# Patient Record
Sex: Male | Born: 1935
Health system: Southern US, Community
[De-identification: ages and names within clinical notes are randomized; demographics above are authoritative.]

## PROBLEM LIST (undated history)

## (undated) DIAGNOSIS — I429 Cardiomyopathy, unspecified: Secondary | ICD-10-CM

## (undated) DIAGNOSIS — R079 Chest pain, unspecified: Secondary | ICD-10-CM

## (undated) DIAGNOSIS — Z9889 Other specified postprocedural states: Secondary | ICD-10-CM

## (undated) DIAGNOSIS — I4510 Unspecified right bundle-branch block: Secondary | ICD-10-CM

## (undated) DIAGNOSIS — I10 Essential (primary) hypertension: Secondary | ICD-10-CM

## (undated) DIAGNOSIS — E6609 Other obesity due to excess calories: Secondary | ICD-10-CM

## (undated) DIAGNOSIS — M169 Osteoarthritis of hip, unspecified: Secondary | ICD-10-CM

## (undated) DIAGNOSIS — E1169 Type 2 diabetes mellitus with other specified complication: Secondary | ICD-10-CM

## (undated) DIAGNOSIS — E1129 Type 2 diabetes mellitus with other diabetic kidney complication: Secondary | ICD-10-CM

## (undated) DIAGNOSIS — H269 Unspecified cataract: Secondary | ICD-10-CM

## (undated) DIAGNOSIS — N183 Chronic kidney disease, stage 3 unspecified: Secondary | ICD-10-CM

## (undated) DIAGNOSIS — E785 Hyperlipidemia, unspecified: Secondary | ICD-10-CM

## (undated) DIAGNOSIS — I872 Venous insufficiency (chronic) (peripheral): Secondary | ICD-10-CM

## (undated) HISTORY — DX: Other obesity due to excess calories: E66.09

## (undated) HISTORY — DX: Venous insufficiency (chronic) (peripheral): I87.2

## (undated) HISTORY — DX: Hyperlipidemia, unspecified: E78.5

## (undated) HISTORY — DX: Essential (primary) hypertension: I10

## (undated) HISTORY — DX: Osteoarthritis of hip, unspecified: M16.9

## (undated) HISTORY — DX: Hyperlipidemia, unspecified: E11.69

## (undated) HISTORY — DX: Other specified postprocedural states: Z98.890

## (undated) HISTORY — DX: Type 2 diabetes mellitus with other diabetic kidney complication: E11.29

## (undated) HISTORY — DX: Chronic kidney disease, stage 3 (moderate): N18.3

## (undated) HISTORY — DX: Unspecified cataract: H26.9

## (undated) HISTORY — DX: Chronic kidney disease, stage 3 unspecified: N18.30

## (undated) HISTORY — DX: Unspecified right bundle-branch block: I45.10

---

## 2007-02-28 ENCOUNTER — Ambulatory Visit: Payer: Self-pay | Admitting: Gastroenterology

## 2007-03-14 ENCOUNTER — Ambulatory Visit: Payer: Self-pay | Admitting: Gastroenterology

## 2007-03-28 ENCOUNTER — Ambulatory Visit: Payer: Self-pay

## 2009-03-25 ENCOUNTER — Encounter: Payer: Self-pay | Admitting: Gastroenterology

## 2009-07-25 ENCOUNTER — Telehealth: Payer: Self-pay | Admitting: Gastroenterology

## 2009-09-09 ENCOUNTER — Encounter (INDEPENDENT_AMBULATORY_CARE_PROVIDER_SITE_OTHER): Payer: Self-pay | Admitting: *Deleted

## 2009-09-09 ENCOUNTER — Ambulatory Visit: Payer: Self-pay | Admitting: Gastroenterology

## 2009-09-09 DIAGNOSIS — R195 Other fecal abnormalities: Secondary | ICD-10-CM

## 2009-09-12 ENCOUNTER — Encounter: Payer: Self-pay | Admitting: Gastroenterology

## 2009-10-14 ENCOUNTER — Ambulatory Visit: Payer: Self-pay | Admitting: Gastroenterology

## 2009-10-14 DIAGNOSIS — K2970 Gastritis, unspecified, without bleeding: Secondary | ICD-10-CM | POA: Insufficient documentation

## 2009-10-14 DIAGNOSIS — K2990 Gastroduodenitis, unspecified, without bleeding: Secondary | ICD-10-CM

## 2009-10-14 LAB — CONVERTED CEMR LAB: UREASE: NEGATIVE

## 2009-10-16 ENCOUNTER — Encounter: Payer: Self-pay | Admitting: Gastroenterology

## 2009-10-22 ENCOUNTER — Encounter: Payer: Self-pay | Admitting: Gastroenterology

## 2010-03-13 ENCOUNTER — Encounter: Payer: Self-pay | Admitting: Cardiovascular Disease

## 2010-03-13 ENCOUNTER — Emergency Department (HOSPITAL_COMMUNITY)
Admission: EM | Admit: 2010-03-13 | Discharge: 2010-03-13 | Payer: Self-pay | Source: Home / Self Care | Admitting: Emergency Medicine

## 2010-03-31 ENCOUNTER — Encounter: Payer: Self-pay | Admitting: Cardiovascular Disease

## 2010-04-23 HISTORY — PX: NM MYOVIEW LTD: HXRAD82

## 2010-05-05 ENCOUNTER — Ambulatory Visit: Payer: Self-pay | Admitting: Cardiovascular Disease

## 2010-05-05 DIAGNOSIS — R072 Precordial pain: Secondary | ICD-10-CM | POA: Insufficient documentation

## 2010-05-05 DIAGNOSIS — I4510 Unspecified right bundle-branch block: Secondary | ICD-10-CM

## 2010-05-08 ENCOUNTER — Telehealth (INDEPENDENT_AMBULATORY_CARE_PROVIDER_SITE_OTHER): Payer: Self-pay | Admitting: *Deleted

## 2010-05-12 ENCOUNTER — Encounter: Payer: Self-pay | Admitting: Cardiology

## 2010-05-12 ENCOUNTER — Ambulatory Visit: Payer: Self-pay

## 2010-05-12 ENCOUNTER — Ambulatory Visit: Payer: Self-pay | Admitting: Cardiology

## 2010-05-12 ENCOUNTER — Encounter (HOSPITAL_COMMUNITY): Admission: RE | Admit: 2010-05-12 | Discharge: 2010-07-29 | Payer: Self-pay | Admitting: Cardiovascular Disease

## 2010-09-22 ENCOUNTER — Telehealth (INDEPENDENT_AMBULATORY_CARE_PROVIDER_SITE_OTHER): Payer: Self-pay | Admitting: *Deleted

## 2010-12-23 NOTE — Assessment & Plan Note (Signed)
Summary: Cardiology Nuclear Study  Nuclear Med Background Indications for Stress Test: Evaluation for Ischemia  Indications Comments: 03/13/10 [facility]:Chest pain, abnormal EKG (RBBB)  History: Myocardial Perfusion Study  History Comments: '08 [id number]:[id number], EF=62%  Symptoms: Chest Pain, Diaphoresis, DOE, Nausea, Vomiting  Symptoms Comments: Last episode of HY:QMVH since d/c.   Nuclear Pre-Procedure Cardiac Risk Factors: Family History - CAD, History of Smoking, NIDDM, Obesity, RBBB Caffeine/Decaff Intake: None NPO After: 9:00 PM Lungs: Clear.  O2 Sat 96% on RA. IV 0.9% NS with Angio Cath: 20g     IV Site: (R) AC IV Started by: Stanton Kidney EMT-P Chest Size (in) 46     Height (in): 72 Weight (lb): 236 BMI: 32.12 Tech Comments: Metoprolol held this am, per patient. CBG= 97 @ 6:40 am today, per patient.  Nuclear Med Study 1 or 2 day study:  1 day     Stress Test Type:  Eugenie Birks Reading MD:  Willa Rough, MD     Referring MD:  Charlton Haws, MD Resting Radionuclide:  Technetium 52m Tetrofosmin     Resting Radionuclide Dose:  11 mCi  Stress Radionuclide:  Technetium 61m Tetrofosmin     Stress Radionuclide Dose:  33 mCi   Stress Protocol   Lexiscan: 0.4 mg   Stress Test Technologist:  Rea [employer] CMA-N     Nuclear Technologist:  Domenic Polite CNMT  Rest Procedure  Myocardial perfusion imaging was performed at rest 45 minutes following the intravenous administration of Myoview Technetium 72m Tetrofosmin.  Stress Procedure  The patient received IV Lexiscan 0.4 mg over 15-seconds.  Myoview injected at 30-seconds.  There were no significant changes with infusion, isolated PVC.  He did c/o throat tightness.  Quantitative spect images were obtained after a 45 minute delay.  QPS Raw Data Images:  Patient motion noted; appropriate software correction applied. Stress Images:  Mild apical thinning. Rest Images:  Same as stress Subtraction (SDS):  No evidence of ischemia. Transient  Ischemic Dilatation:  1.08  (Normal <1.22)  Lung/Heart Ratio:  .41  (Normal <0.45)  Quantitative Gated Spect Images QGS EDV:  104 ml QGS ESV:  20 ml QGS EF:  62 % QGS cine images:  Normal motion  Findings Normal nuclear study      Overall Impression  Exercise Capacity: Lexiscan BP Response: Normal blood pressure response. Clinical Symptoms: Throat tight ECG Impression: No significant ST segment change suggestive of ischemia. Overall Impression: Normal stress nuclear study.  Appended Document: Cardiology Nuclear Study normal nuclear study  Appended Document: Cardiology Nuclear Study pt aware of results

## 2010-12-23 NOTE — Progress Notes (Signed)
Summary: Nuclear Pre-Procedure  Phone Note Outgoing Call Call back at [facility] Phone [phone]   Call placed by: Stanton Kidney, EMT-P,  May 08, 2010 10:55 AM Action Taken: Phone Call Completed Summary of Call: Left message with information on Myoview Information Sheet (see scanned document for details).     Nuclear Med Background Indications for Stress Test: Evaluation for Ischemia  Indications Comments: 03/13/10 [facility]: ABN. EKG (RBBB)  History: Myocardial Perfusion Study  History Comments: '08 [facility]PS: EF=62%, NL  Symptoms: Chest Pain, DOE    Nuclear Pre-Procedure Cardiac Risk Factors: Family History - CAD, History of Smoking, NIDDM, RBBB Height (in): 72

## 2010-12-23 NOTE — Miscellaneous (Signed)
  Clinical Lists Changes  Medications: Changed medication from METOPROLOL TARTRATE 100 MG TABS (METOPROLOL TARTRATE) 1 tablet by mouth once daily to METOPROLOL SUCCINATE 100 MG XR24H-TAB (METOPROLOL SUCCINATE) Take one tablet by mouth daily

## 2010-12-23 NOTE — Consult Note (Signed)
Summary: [facility]   Imported By: Marylou Mccoy 04/30/2010 11:58:40  _____________________________________________________________________  External Attachment:    Type:   Image     Comment:   External Document

## 2010-12-23 NOTE — Progress Notes (Signed)
  Phone Note From Other Clinic   Caller: Allison/[facility] Initial call taken by: Hillery Jacks test over to [phone] [facility]  September 22, 2010 11:13 AM

## 2010-12-23 NOTE — Assessment & Plan Note (Signed)
Summary: np3/ hyperlipidemia/ pt has medicare,. bcbs. gd   Referring Provider:  [name], MD Primary Provider:  [name], MD  CC:  check up pt was recently in ER.  History of Present Illness: Steven Myers is a pleasant patient of Dr Perrin Maltese.  He is referred for SSCP, HTN and RBBB.  He is a type 2 diabetic.  ON 03/13/10 he was seen in the ER for what appears to be an episode of indigestion.  He R/O ECG showed RBBB no change from ECG 11/10.  He has not had a recurrence.  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  His inital episode on April was associated with diaphoresis, and epigastric pain.  I reviewed his ER report and labs were ok, CXR NAD and enzymes negative.  He reports haing a stress test a few years ago that was ok.  He monitors his BS at home regularly and indicates FBS around 100-105.  His cholesterol is normal and he does not smoke  Current Problems (verified): 1)  Chest Pain Unspecified  (ICD-786.50) 2)  Gastritis  (ICD-535.50) 3)  Fecal Occult Blood  (ICD-792.1) 4)  Diabetes Mellitus-type II  (ICD-250.00)  Current Medications (verified): 1)  Metformin Hcl 1000 Mg Tabs (Metformin Hcl) .Marland Kitchen.. 1 Tablet By Mouth Two Times A Day 2)  Xalatan 0.005 % Soln (Latanoprost) .Marland Kitchen.. 1 Drop Each Eye At Bedtime 3)  Multivitamins  Tabs (Multiple Vitamin) .Marland Kitchen.. 1 Tablet By Mouth Once Daily 4)  Fish Oil 1000 Mg Caps (Omega-3 Fatty Acids) .Marland Kitchen.. 1 Capsule By Mouth Two Times A Day 5)  Trandolapril 4 Mg Tabs (Trandolapril) .Marland Kitchen.. 1 Tablet By Mouth Once Daily 6)  Hydrochlorothiazide 12.5 Mg Caps (Hydrochlorothiazide) .Marland Kitchen.. 1 Capsule By Mouth Two Times A Day 7)  Metoprolol Tartrate 100 Mg Tabs (Metoprolol Tartrate) .Marland Kitchen.. 1 Tablet By Mouth Once Daily 8)  Aspirin 81 Mg Tbec (Aspirin) .Marland Kitchen.. 1 Tablet By Mouth Once Daily  Allergies (verified): No Known Drug Allergies  Past History:  Past Medical History: Last updated:  04/30/2010 Diabetes Glaucoma Hypertension Abnormal ECG: RBBB  Past Surgical History: Last updated: 09/09/2009 None  Family History: Last updated: 09/09/2009 No FH of Colon Cancer: Family History of Diabetes: mother  Social History: Last updated: 09/09/2009 Occupation: [employer] Married 1 boy 1 girl Patient is a former smoker. quit 40 years ago Alcohol Use - no Illicit Drug Use - no  Review of Systems       Denies fever, malais, weight loss, blurry vision, decreased visual acuity, cough, sputum, SOB, hemoptysis, pleuritic pain, palpitaitons, heartburn, abdominal pain, melena, lower extremity edema, claudication, or rash.   Vital Signs:  Patient profile:   76 year old male Height:      72 inches Weight:      204 pounds BMI:     27.77 Pulse rate:   73 / minute Resp:     12 per minute BP sitting:   150 / 90  (left arm)  Vitals Entered By: Kem Parkinson (May 05, 2010 8:07 AM)  Physical Exam  General:  Affect appropriate Healthy:  appears stated age HEENT: normal Neck supple with no adenopathy JVP normal no bruits no thyromegaly Lungs clear with no wheezing and good diaphragmatic motion Heart:  S1/S2 no murmur,rub, gallop or click PMI normal Abdomen: benighn, BS positve, no tenderness, no AAA no bruit.  No HSM or HJR Distal pulses intact with no bruits No edema  Neuro non-focal Skin warm and dry    Impression & Recommendations:  Problem # 1:  CHEST PAIN UNSPECIFIED (ICD-786.50) F/U myouve in light of DM His updated medication list for this problem includes:    Trandolapril 4 Mg Tabs (Trandolapril) .Marland Kitchen... 1 tablet by mouth once daily    Metoprolol Tartrate 100 Mg Tabs (Metoprolol tartrate) .Marland Kitchen... 1 tablet by mouth once daily    Aspirin 81 Mg Tbec (Aspirin) .Marland Kitchen... 1 tablet by mouth once daily  Orders: Nuclear Stress Test (Nuc Stress Test)  Problem # 2:  DIABETES MELLITUS-TYPE II (ICD-250.00) F/U DR Guest  Continiue mietformin.  Target HbA1c 6.5 or  less His updated medication list for this problem includes:    Metformin Hcl 1000 Mg Tabs (Metformin hcl) .Marland Kitchen... 1 tablet by mouth two times a day    Trandolapril 4 Mg Tabs (Trandolapril) .Marland Kitchen... 1 tablet by mouth once daily    Aspirin 81 Mg Tbec (Aspirin) .Marland Kitchen... 1 tablet by mouth once daily  Problem # 3:  RBBB (ICD-426.4) Not new by comparision  No evidence of structural heart disease His updated medication list for this problem includes:    Trandolapril 4 Mg Tabs (Trandolapril) .Marland Kitchen... 1 tablet by mouth once daily    Metoprolol Tartrate 100 Mg Tabs (Metoprolol tartrate) .Marland Kitchen... 1 tablet by mouth once daily    Aspirin 81 Mg Tbec (Aspirin) .Marland Kitchen... 1 tablet by mouth once daily  Problem # 4:  ESSENTIAL HYPERTENSION, BENIGN (ICD-401.1) Continue 3 drug Rx.  Weight loss and low sodium issues discussed His updated medication list for this problem includes:    Trandolapril 4 Mg Tabs (Trandolapril) .Marland Kitchen... 1 tablet by mouth once daily    Hydrochlorothiazide 12.5 Mg Caps (Hydrochlorothiazide) .Marland Kitchen... 1 capsule by mouth two times a day    Metoprolol Tartrate 100 Mg Tabs (Metoprolol tartrate) .Marland Kitchen... 1 tablet by mouth once daily    Aspirin 81 Mg Tbec (Aspirin) .Marland Kitchen... 1 tablet by mouth once daily  Patient Instructions: 1)  Your physician recommends that you schedule a follow-up appointment in: ONE YEAR 2)  Your physician has requested that you have an exercise stress myoview.  For further information please visit [email or web address]  Please follow instruction sheet, as given.   EKG Report  Procedure date:  05/05/2010  Findings:      NSR 86 RBBB

## 2011-02-25 LAB — GLUCOSE, CAPILLARY
Glucose-Capillary: 111 mg/dL — ABNORMAL HIGH (ref 70–99)
Glucose-Capillary: 78 mg/dL (ref 70–99)

## 2011-04-27 ENCOUNTER — Encounter: Payer: Self-pay | Admitting: Cardiovascular Disease

## 2011-05-11 ENCOUNTER — Ambulatory Visit (INDEPENDENT_AMBULATORY_CARE_PROVIDER_SITE_OTHER): Payer: Medicare Other | Admitting: Cardiovascular Disease

## 2011-05-11 ENCOUNTER — Encounter: Payer: Self-pay | Admitting: Cardiovascular Disease

## 2011-05-11 DIAGNOSIS — I4510 Unspecified right bundle-branch block: Secondary | ICD-10-CM

## 2011-05-11 DIAGNOSIS — R609 Edema, unspecified: Secondary | ICD-10-CM

## 2011-05-11 DIAGNOSIS — I10 Essential (primary) hypertension: Secondary | ICD-10-CM

## 2011-05-11 DIAGNOSIS — R079 Chest pain, unspecified: Secondary | ICD-10-CM

## 2011-05-11 MED ORDER — POTASSIUM CHLORIDE ER 10 MEQ PO TBCR: 10.0000 meq | EXTENDED_RELEASE_TABLET | ORAL | Status: DC

## 2011-05-11 MED ORDER — FUROSEMIDE 20 MG PO TABS: 20.0000 mg | ORAL_TABLET | Freq: Two times a day (BID) | ORAL | Status: DC

## 2011-05-11 NOTE — Assessment & Plan Note (Signed)
Well controlled.  Continue current medications and low sodium Dash type diet.    

## 2011-05-11 NOTE — Assessment & Plan Note (Signed)
Adjust diuretics.  Continue to minimize salt and use support hose.  F/U BMET with [name] in 2-3 weeks

## 2011-05-11 NOTE — Assessment & Plan Note (Signed)
Resolved with negative myovue 6/11

## 2011-05-11 NOTE — Progress Notes (Signed)
Steven Myers is a pleasant patient of Dr Perrin Maltese. He was  Referred 6/11  for SSCP, HTN and RBBB. He is a type 2 diabetic. ON 03/13/10 he was seen in the ER for what appears to be an episode of indigestion. He R/O ECG showed RBBB no change from ECG 11/10. He has not had a recurrence. 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 His inital episode on April was associated with diaphoresis, and epigastric pain. I reviewed his ER report and labs were ok, CXR NAD and enzymes negative. He reports haing a stress test a few years ago that was ok. He monitors his BS at home regularly and indicates FBS around 100-105. His cholesterol is normal and he does not smoke  F/U myovue in our office 05/12/10 was normal EF 62% done with Lexiscan  Has some LLE edema that is persistant despite bid HCTZ.  Will change to Lasix and add KCL.  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  Has support hose.    ROS: Denies fever, malais, weight loss, blurry vision, decreased visual acuity, cough, sputum, SOB, hemoptysis, pleuritic pain, palpitaitons, heartburn, abdominal pain, melena,  claudication, or rash.  All other systems reviewed and negative  General: Affect appropriate Healthy:  appears stated age HEENT: normal Neck supple with no adenopathy JVP normal no bruits no thyromegaly Lungs clear with no wheezing and good diaphragmatic motion Heart:  S1/S2 no murmur,rub, gallop or click PMI normal Abdomen: benighn, BS positve, no tenderness, no AAA no bruit.  No HSM or HJR Distal pulses intact with no bruits Plus 2 LLE edema Neuro non-focal Skin warm and dry No muscular weakness   Current Outpatient Prescriptions  Medication Sig Dispense Refill  . aspirin 81 MG tablet Take 81 mg by mouth daily.        . hydrochlorothiazide (,MICROZIDE/HYDRODIURIL,) 12.5 MG capsule Take 12.5 mg by mouth 2 (two) times daily.          . metFORMIN (GLUMETZA) 1000 MG (MOD) 24 hr tablet Take 1,000 mg by mouth 2 (two) times daily with a meal.        . metoprolol (TOPROL-XL) 100 MG 24 hr tablet Take 100 mg by mouth daily.        . Multiple Vitamin (MULTIVITAMIN) capsule Take 1 capsule by mouth daily.        . Omega-3 Fatty Acids (FISH OIL) 1000 MG CAPS Take 1 capsule by mouth 2 (two) times daily.        . trandolapril (MAVIK) 4 MG tablet Take 4 mg by mouth daily.          Allergies  Review of patient's allergies indicates not on file.  Electrocardiogram:  NSR RBBB poor Rwaves in lateral leads  Assessment and Plan

## 2011-05-11 NOTE — Assessment & Plan Note (Signed)
No change in ECG Stable and no evidence of high grade AV block

## 2011-05-11 NOTE — Patient Instructions (Signed)
Your physician recommends that you schedule a follow-up appointment in: 3 months with Dr.Nishan  Please f/u with [name] in 2-3  weeks with repeat blood work.  Your physician has recommended you make the following change in your medication: STOP HCTZ. START LASIX 20mg  twice daily and POTASSIUM 10 meq daily.

## 2011-08-17 ENCOUNTER — Ambulatory Visit: Payer: BLUE CROSS/BLUE SHIELD | Admitting: Cardiovascular Disease

## 2011-08-31 ENCOUNTER — Ambulatory Visit: Payer: BLUE CROSS/BLUE SHIELD | Admitting: Cardiovascular Disease

## 2011-10-12 ENCOUNTER — Encounter: Payer: Self-pay | Admitting: Cardiovascular Disease

## 2011-11-04 ENCOUNTER — Telehealth: Payer: Self-pay | Admitting: Cardiovascular Disease

## 2011-11-04 NOTE — Telephone Encounter (Signed)
All Cardiac faxed to [facility] on [address]/Dr.Polite @ [phone] 12.12.12.km

## 2011-12-07 ENCOUNTER — Ambulatory Visit (INDEPENDENT_AMBULATORY_CARE_PROVIDER_SITE_OTHER): Payer: Medicare Other | Admitting: Cardiovascular Disease

## 2011-12-07 ENCOUNTER — Encounter: Payer: Self-pay | Admitting: Cardiovascular Disease

## 2011-12-07 DIAGNOSIS — R609 Edema, unspecified: Secondary | ICD-10-CM

## 2011-12-07 DIAGNOSIS — E119 Type 2 diabetes mellitus without complications: Secondary | ICD-10-CM | POA: Diagnosis not present

## 2011-12-07 DIAGNOSIS — I4510 Unspecified right bundle-branch block: Secondary | ICD-10-CM

## 2011-12-07 DIAGNOSIS — I10 Essential (primary) hypertension: Secondary | ICD-10-CM

## 2011-12-07 DIAGNOSIS — R079 Chest pain, unspecified: Secondary | ICD-10-CM | POA: Diagnosis not present

## 2011-12-07 NOTE — Assessment & Plan Note (Signed)
Dependant contiue diuretic.  Elevate legs at end of day

## 2011-12-07 NOTE — Progress Notes (Signed)
Steven Myers is a pleasant patient of Dr Perrin Maltese. He was Referred 6/11 for SSCP, HTN and RBBB. He is a type 2 diabetic. ON 03/13/10 he was seen in the ER for what appears to be an episode of indigestion. He R/O ECG showed RBBB no change from ECG 11/10. He has not had a recurrence. 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 His inital episode on April was associated with diaphoresis, and epigastric pain. I reviewed his ER report and labs were ok, CXR NAD and enzymes negative. He reports haing a stress test a few years ago that was ok. He monitors his BS at home regularly and indicates FBS around 100-105. 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   ROS: Denies fever, malais, weight loss, blurry vision, decreased visual acuity, cough, sputum, SOB, hemoptysis, pleuritic pain, palpitaitons, heartburn, abdominal pain, melena, lower extremity edema, claudication, or rash.  All other systems reviewed and negative  General: Affect appropriate Healthy:  appears stated age HEENT: normal Neck supple with no adenopathy JVP normal no bruits no thyromegaly Lungs clear with no wheezing and good diaphragmatic motion Heart:  S1/S2 no murmur,rub, gallop or click PMI normal Abdomen: benighn, BS positve, no tenderness, no AAA no bruit.  No HSM or HJR Distal pulses intact with no bruits Plus one bilateral edema Neuro non-focal Skin warm and dry No muscular weakness   Current Outpatient Prescriptions  Medication Sig Dispense Refill  . aspirin 81 MG tablet Take 81 mg by mouth daily.        . furosemide (LASIX) 20 MG tablet Take 1 tablet (20 mg total) by mouth 2 (two) times daily.  60 tablet  6  . glimepiride (AMARYL) 4 MG tablet Take 4 mg by mouth daily before breakfast.      . metFORMIN (GLUMETZA) 1000 MG (MOD) 24  hr tablet Take 1,000 mg by mouth 2 (two) times daily with a meal.        . metoprolol (TOPROL-XL) 100 MG 24 hr tablet Take 100 mg by mouth daily.        . Multiple Vitamin (MULTIVITAMIN) capsule Take 1 capsule by mouth daily.        . Omega-3 Fatty Acids (FISH OIL) 1000 MG CAPS Take 1 capsule by mouth 2 (two) times daily.        . trandolapril (MAVIK) 4 MG tablet Take 4 mg by mouth 2 (two) times daily.         Allergies  Review of patient's allergies indicates no known allergies.  Electrocardiogram:  Assessment and Plan

## 2011-12-07 NOTE — Assessment & Plan Note (Signed)
F/U Polite  A1c over 9 3 months ago and new meds added.  Poor diet discussed at length

## 2011-12-07 NOTE — Patient Instructions (Signed)
Your physician wants you to follow-up in: YEAR WITH DR NISHAN  You will receive a reminder letter in the mail two months in advance. If you don't receive a letter, please call our office to schedule the follow-up appointment.  Your physician recommends that you continue on your current medications as directed. Please refer to the Current Medication list given to you today. 

## 2011-12-07 NOTE — Assessment & Plan Note (Signed)
Normal myovue 6/11 observe.

## 2011-12-07 NOTE — Assessment & Plan Note (Signed)
Well controlled.  Continue current medications and low sodium Dash type diet.    

## 2011-12-07 NOTE — Assessment & Plan Note (Signed)
Chronic yearly ECG

## 2011-12-18 ENCOUNTER — Other Ambulatory Visit: Payer: Self-pay | Admitting: Cardiovascular Disease

## 2012-01-25 DIAGNOSIS — H409 Unspecified glaucoma: Secondary | ICD-10-CM | POA: Diagnosis not present

## 2012-01-25 DIAGNOSIS — E119 Type 2 diabetes mellitus without complications: Secondary | ICD-10-CM | POA: Diagnosis not present

## 2012-01-25 DIAGNOSIS — H4010X0 Unspecified open-angle glaucoma, stage unspecified: Secondary | ICD-10-CM | POA: Diagnosis not present

## 2012-01-26 ENCOUNTER — Encounter (HOSPITAL_COMMUNITY): Payer: Self-pay | Admitting: Emergency Medicine

## 2012-01-26 ENCOUNTER — Emergency Department (HOSPITAL_COMMUNITY)
Admission: EM | Admit: 2012-01-26 | Discharge: 2012-01-27 | Disposition: A | Discharge status: Home / Self Care | Payer: Medicare Other | Attending: Emergency Medicine | Admitting: Emergency Medicine

## 2012-01-26 ENCOUNTER — Emergency Department (HOSPITAL_COMMUNITY): Payer: Medicare Other

## 2012-01-26 ENCOUNTER — Other Ambulatory Visit: Payer: Self-pay

## 2012-01-26 DIAGNOSIS — N390 Urinary tract infection, site not specified: Secondary | ICD-10-CM | POA: Diagnosis not present

## 2012-01-26 DIAGNOSIS — R5381 Other malaise: Secondary | ICD-10-CM | POA: Diagnosis not present

## 2012-01-26 DIAGNOSIS — R197 Diarrhea, unspecified: Secondary | ICD-10-CM | POA: Diagnosis not present

## 2012-01-26 DIAGNOSIS — R509 Fever, unspecified: Secondary | ICD-10-CM | POA: Diagnosis not present

## 2012-01-26 DIAGNOSIS — R109 Unspecified abdominal pain: Secondary | ICD-10-CM | POA: Diagnosis not present

## 2012-01-26 DIAGNOSIS — IMO0001 Reserved for inherently not codable concepts without codable children: Secondary | ICD-10-CM | POA: Insufficient documentation

## 2012-01-26 DIAGNOSIS — H409 Unspecified glaucoma: Secondary | ICD-10-CM | POA: Insufficient documentation

## 2012-01-26 DIAGNOSIS — I10 Essential (primary) hypertension: Secondary | ICD-10-CM | POA: Diagnosis not present

## 2012-01-26 DIAGNOSIS — E119 Type 2 diabetes mellitus without complications: Secondary | ICD-10-CM | POA: Insufficient documentation

## 2012-01-26 LAB — COMPREHENSIVE METABOLIC PANEL
ALT: 27 U/L (ref 0–53)
AST: 27 U/L (ref 0–37)
Albumin: 4 g/dL (ref 3.5–5.2)
Alkaline Phosphatase: 65 U/L (ref 39–117)
Chloride: 100 mEq/L (ref 96–112)
Potassium: 3.9 mEq/L (ref 3.5–5.1)
Sodium: 139 mEq/L (ref 135–145)
Total Bilirubin: 0.7 mg/dL (ref 0.3–1.2)

## 2012-01-26 LAB — URINALYSIS, ROUTINE W REFLEX MICROSCOPIC
Glucose, UA: NEGATIVE mg/dL
Hgb urine dipstick: NEGATIVE
Ketones, ur: 15 mg/dL — AB
pH: 6.5 (ref 5.0–8.0)

## 2012-01-26 LAB — POCT I-STAT TROPONIN I: Troponin i, poc: 0 ng/mL (ref 0.00–0.08)

## 2012-01-26 LAB — DIFFERENTIAL
Basophils Absolute: 0 10*3/uL (ref 0.0–0.1)
Basophils Relative: 0 % (ref 0–1)
Lymphocytes Relative: 8 % — ABNORMAL LOW (ref 12–46)
Neutro Abs: 12.4 10*3/uL — ABNORMAL HIGH (ref 1.7–7.7)
Neutrophils Relative %: 83 % — ABNORMAL HIGH (ref 43–77)

## 2012-01-26 LAB — CBC
MCHC: 34.2 g/dL (ref 30.0–36.0)
RDW: 14.1 % (ref 11.5–15.5)
WBC: 14.9 10*3/uL — ABNORMAL HIGH (ref 4.0–10.5)

## 2012-01-26 LAB — GLUCOSE, CAPILLARY

## 2012-01-26 LAB — URINE MICROSCOPIC-ADD ON

## 2012-01-26 MED ORDER — SODIUM CHLORIDE 0.9 % IV BOLUS (SEPSIS)
1000.0000 mL | Freq: Once | INTRAVENOUS | Status: AC
  Administered 2012-01-26: 1000 mL via INTRAVENOUS

## 2012-01-26 MED ORDER — SODIUM CHLORIDE 0.9 % IV BOLUS (SEPSIS)
500.0000 mL | Freq: Once | INTRAVENOUS | Status: AC
  Administered 2012-01-26: 500 mL via INTRAVENOUS

## 2012-01-26 NOTE — ED Notes (Signed)
Pt reports generalized weakness with n/v/d x1 week

## 2012-01-26 NOTE — ED Provider Notes (Signed)
History     CSN: [id number]  Arrival date & time 01/26/12  1609   First MD Initiated Contact with Patient 01/26/12 2005     8:13 PM HPI Patient reports for the last 2 weeks has not been feeling well. He reports diarrhea approximately 3 loose stools today. States today symptoms worsened. Family reports he was stumbling and had to come home early from work. States he checked his temperature and it was 101.8. Most reports his blood sugar was 174 fasting. Patient states at times will have abdominal pain but reports pain resolves after bowel movements. Also has associated myalgias appear Denies blood in stool,  urinary symptoms,  cough, back pain, headache.  Patient is a 77 y.o. male presenting with diarrhea. The history is provided by the patient.  Diarrhea The primary symptoms include fever, fatigue, abdominal pain, diarrhea and myalgias. Primary symptoms do not include nausea, vomiting, melena, dysuria or rash. Episode onset: 2 weeks. The problem has not changed since onset. The maximum temperature recorded prior to his arrival was 101 to 101.9 F. The temperature was taken by an oral thermometer.  The diarrhea began more than 1 week ago. The diarrhea occurs 2 to 4 times per day.  The myalgias are associated with weakness.   The illness does not include chills, anorexia, bloating, constipation, back pain or itching.    Past Medical History  Diagnosis Date  . Diabetes mellitus   . Glaucoma   . HTN (hypertension)   . RBBB (right bundle branch block)   . History of colonoscopy     History reviewed. No pertinent past surgical history.  Family History  Problem Relation Age of Onset  . Diabetes Mother   . Colon cancer Neg Hx     History  Substance Use Topics  . Smoking status: Former Games developer  . Smokeless tobacco: Not on file   Comment: quit 40 years ago  . Alcohol Use: No      Review of Systems  Constitutional: Positive for fever and fatigue. Negative for chills.  Respiratory:  Negative for shortness of breath.   Cardiovascular: Negative for chest pain.  Gastrointestinal: Positive for abdominal pain and diarrhea. Negative for nausea, vomiting, constipation, blood in stool, melena, rectal pain, bloating and anorexia.  Genitourinary: Negative for dysuria, hematuria, flank pain, discharge, penile pain and testicular pain.  Musculoskeletal: Positive for myalgias. Negative for back pain.  Skin: Negative for itching and rash.  Neurological: Positive for weakness. Negative for dizziness, numbness and headaches.  All other systems reviewed and are negative.    Allergies  Review of patient's allergies indicates no known allergies.  Home Medications   Current Outpatient Rx  Name Route Sig Dispense Refill  . ASPIRIN 81 MG PO TABS Oral Take 81 mg by mouth daily.      . FUROSEMIDE 20 MG PO TABS  take 1 tablet by mouth twice a day 60 tablet 6  . GLIMEPIRIDE 4 MG PO TABS Oral Take 4 mg by mouth daily before breakfast.    . LATANOPROST 0.005 % OP SOLN Both Eyes Place 1 drop into both eyes at bedtime.    Marland Kitchen METFORMIN HCL ER (MOD) 1000 MG PO TB24 Oral Take 1,000 mg by mouth 2 (two) times daily with a meal.      . METOPROLOL SUCCINATE ER 100 MG PO TB24 Oral Take 100 mg by mouth daily.      . MULTIVITAMINS PO CAPS Oral Take 1 capsule by mouth daily.      Marland Kitchen  FISH OIL 1000 MG PO CAPS Oral Take 1 capsule by mouth 2 (two) times daily.     . TRANDOLAPRIL 4 MG PO TABS Oral Take 4 mg by mouth 2 (two) times daily.       BP 149/92  Pulse 127  Temp(Src) 99.6 F (37.6 C) (Oral)  Resp 20  SpO2 95%  Physical Exam  Constitutional: He is oriented to person, place, and time. He appears well-developed and well-nourished. No distress.  HENT:  Head: Normocephalic and atraumatic.  Eyes: Conjunctivae are normal. Pupils are equal, round, and reactive to light.  Neck: Normal range of motion. Neck supple.  Cardiovascular: Regular rhythm and normal heart sounds.  Tachycardia present.  Exam  reveals no gallop and no friction rub.   No murmur heard. Pulmonary/Chest: Effort normal and breath sounds normal. He has no wheezes. He has no rales. He exhibits no tenderness.  Abdominal: Soft. Bowel sounds are normal. He exhibits no distension and no mass. There is no tenderness. There is no rebound and no guarding.  Neurological: He is alert and oriented to person, place, and time.  Skin: Skin is warm and dry. No rash noted. No erythema. No pallor.  Psychiatric: He has a normal mood and affect. His behavior is normal.    ED Course  Procedures  Results for orders placed during the hospital encounter of 01/26/12  CBC      Component Value Range   WBC 14.9 (*) 4.0 - 10.5 (K/uL)   RBC 5.46  4.22 - 5.81 (MIL/uL)   Hemoglobin 15.5  13.0 - 17.0 (g/dL)   HCT 16.1  09.6 - 04.5 (%)   MCV 83.0  78.0 - 100.0 (fL)   MCH 28.4  26.0 - 34.0 (pg)   MCHC 34.2  30.0 - 36.0 (g/dL)   RDW 40.9  81.1 - 91.4 (%)   Platelets 162  150 - 400 (K/uL)  DIFFERENTIAL      Component Value Range   Neutrophils Relative 83 (*) 43 - 77 (%)   Neutro Abs 12.4 (*) 1.7 - 7.7 (K/uL)   Lymphocytes Relative 8 (*) 12 - 46 (%)   Lymphs Abs 1.2  0.7 - 4.0 (K/uL)   Monocytes Relative 9  3 - 12 (%)   Monocytes Absolute 1.4 (*) 0.1 - 1.0 (K/uL)   Eosinophils Relative 0  0 - 5 (%)   Eosinophils Absolute 0.0  0.0 - 0.7 (K/uL)   Basophils Relative 0  0 - 1 (%)   Basophils Absolute 0.0  0.0 - 0.1 (K/uL)  COMPREHENSIVE METABOLIC PANEL      Component Value Range   Sodium 139  135 - 145 (mEq/L)   Potassium 3.9  3.5 - 5.1 (mEq/L)   Chloride 100  96 - 112 (mEq/L)   CO2 28  19 - 32 (mEq/L)   Glucose, Bld 123 (*) 70 - 99 (mg/dL)   BUN 17  6 - 23 (mg/dL)   Creatinine, Ser 7.82  0.50 - 1.35 (mg/dL)   Calcium 9.6  8.4 - 95.6 (mg/dL)   Total Protein 7.4  6.0 - 8.3 (g/dL)   Albumin 4.0  3.5 - 5.2 (g/dL)   AST 27  0 - 37 (U/L)   ALT 27  0 - 53 (U/L)   Alkaline Phosphatase 65  39 - 117 (U/L)   Total Bilirubin 0.7  0.3 - 1.2  (mg/dL)   GFR calc non Af Amer 53 (*) >90 (mL/min)   GFR calc Af Amer 62 (*) >90 (  mL/min)  LIPASE, BLOOD      Component Value Range   Lipase 32  11 - 59 (U/L)  URINALYSIS, ROUTINE W REFLEX MICROSCOPIC      Component Value Range   Color, Urine YELLOW  YELLOW    APPearance CLOUDY (*) CLEAR    Specific Gravity, Urine 1.014  1.005 - 1.030    pH 6.5  5.0 - 8.0    Glucose, UA NEGATIVE  NEGATIVE (mg/dL)   Hgb urine dipstick NEGATIVE  NEGATIVE    Bilirubin Urine NEGATIVE  NEGATIVE    Ketones, ur 15 (*) NEGATIVE (mg/dL)   Protein, ur NEGATIVE  NEGATIVE (mg/dL)   Urobilinogen, UA 1.0  0.0 - 1.0 (mg/dL)   Nitrite NEGATIVE  NEGATIVE    Leukocytes, UA SMALL (*) NEGATIVE   GLUCOSE, CAPILLARY      Component Value Range   Glucose-Capillary 119 (*) 70 - 99 (mg/dL)   Comment 1 Notify RN     Comment 2 Documented in Chart    POCT I-STAT TROPONIN I      Component Value Range   Troponin i, poc 0.00  0.00 - 0.08 (ng/mL)   Comment 3           URINE MICROSCOPIC-ADD ON      Component Value Range   WBC, UA 11-20  <3 (WBC/hpf)   RBC / HPF 0-2  <3 (RBC/hpf)   Bacteria, UA FEW (*) RARE    Dg Chest 2 View  01/26/2012  *RADIOLOGY REPORT*  Clinical Data: Fever.  Diarrhea.  CHEST - 2 VIEW  Comparison: None.  Findings: Heart size and pulmonary vascularity are normal. Thoracic aorta is quite tortuous.  Slight linear atelectasis or scarring at the right lung base.  No effusions.  Accentuation of the upper thoracic kyphosis.  IMPRESSION: Slight atelectasis or scarring at the right base.  Original Report Authenticated By: Gwynn Burly, M.D.   Dg Abd 2 Views  01/26/2012  *RADIOLOGY REPORT*  Clinical Data: Fever.  Diarrhea.  ABDOMEN - 2 VIEW  Comparison: None.  Findings: No free air in the abdomen.  Air scattered throughout nondistended loops of large and small bowel.  No worrisome abdominal calcifications.  No acute osseous abnormality.  Slight thoracolumbar scoliosis.  IMPRESSION: No acute abnormalities.  Original  Report Authenticated By: Gwynn Burly, M.D.    ED ECG REPORT   Date: 01/27/2012  EKG Time: 12:19 AM  Rate: 109  Rhythm: sinus tachycardia  Axis: nml  Intervals:right bundle branch block  ST&T Change: nonspecific ST, changes   Narrative Interpretation: Unchanged since 03/13/2010    MDM    Patient's labs show urinary tract infection and slight increase in white count. Will discharge home with Cipro which will treat UTI and possible colitis. Patient ambulated without difficulty reports he feels significantly improved. Advised close followup with primary care physician in immediate return to emergency department for worsening symptoms. Patient and family agree with plan and are ready for discharge      Thomasene Lot, PA-C 01/27/12 0007  Thomasene Lot, PA-C 01/27/12 0020

## 2012-01-26 NOTE — ED Notes (Signed)
Pt c/o generalized body aches, elevated temp and just feeling weak.  Onset at 3:00am today

## 2012-01-27 MED ORDER — CIPROFLOXACIN HCL 500 MG PO TABS
500.0000 mg | ORAL_TABLET | Freq: Once | ORAL | Status: AC
  Administered 2012-01-27: 500 mg via ORAL
  Filled 2012-01-27: qty 1

## 2012-01-27 MED ORDER — CIPROFLOXACIN HCL 500 MG PO TABS: 500.0000 mg | ORAL_TABLET | Freq: Two times a day (BID) | ORAL | Status: AC

## 2012-01-27 NOTE — Discharge Instructions (Signed)
Please check sugar daily as Cipro and Glimiperide can cause unusual decrease in sugar.  Urinary Tract Infection Infections of the urinary tract can start in several places. A bladder infection (cystitis), a kidney infection (pyelonephritis), and a prostate infection (prostatitis) are different types of urinary tract infections (UTIs). They usually get better if treated with medicines (antibiotics) that kill germs. Take all the medicine until it is gone. You or your child may feel better in a few days, but TAKE ALL MEDICINE or the infection may not respond and may become more difficult to treat. HOME CARE INSTRUCTIONS   Drink enough water and fluids to keep the urine clear or pale yellow. Cranberry juice is especially recommended, in addition to large amounts of water.   Avoid caffeine, tea, and carbonated beverages. They tend to irritate the bladder.   Alcohol may irritate the prostate.   Only take over-the-counter or prescription medicines for pain, discomfort, or fever as directed by your caregiver.  To prevent further infections:  Empty the bladder often. Avoid holding urine for long periods of time.   After a bowel movement, women should cleanse from front to back. Use each tissue only once.   Empty the bladder before and after sexual intercourse.  FINDING OUT THE RESULTS OF YOUR TEST Not all test results are available during your visit. If your or your child's test results are not back during the visit, make an appointment with your caregiver to find out the results. Do not assume everything is normal if you have not heard from your caregiver or the medical facility. It is important for you to follow up on all test results. SEEK MEDICAL CARE IF:   There is back pain.   Your baby is older than 3 months with a rectal temperature of 100.5 F (38.1 C) or higher for more than 1 day.   Your or your child's problems (symptoms) are no better in 3 days. Return sooner if you or your child is  getting worse.  SEEK IMMEDIATE MEDICAL CARE IF:   There is severe back pain or lower abdominal pain.   You or your child develops chills.   You have a fever.   Your baby is older than 3 months with a rectal temperature of 102 F (38.9 C) or higher.   Your baby is 28 months old or younger with a rectal temperature of 100.4 F (38 C) or higher.   There is nausea or vomiting.   There is continued burning or discomfort with urination.  MAKE SURE YOU:   Understand these instructions.   Will watch your condition.   Will get help right away if you are not doing well or get worse.  Document Released: 08/19/2005 Document Revised: 10/29/2011 Document Reviewed: 03/24/2007 [facility] Patient Information 2012 [place], [place].  Diet for Diarrhea, Adult Having frequent, runny stools (diarrhea) has many causes. Diarrhea may be caused or worsened by food or drink. Diarrhea may be relieved by changing your diet. IF YOU ARE NOT TOLERATING SOLID FOODS:  Drink enough water and fluids to keep your urine clear or pale yellow.   Avoid sugary drinks and sodas as well as milk-based beverages.   Avoid beverages containing caffeine and alcohol.   You may try rehydrating beverages. You can make your own by following this recipe:    tsp table salt.    tsp baking soda.   ? tsp salt substitute (potassium chloride).   1 tbs + 1 tsp sugar.   1 qt water.  As your stools become more solid, you can start eating solid foods. Add foods one at a time. If a certain food causes your diarrhea to get worse, avoid that food and try other foods. A low fiber, low-fat, and lactose-free diet is recommended. Small, frequent meals may be better tolerated.  Starches  Allowed:  White, [place], and pita breads, plain rolls, buns, bagels. Plain muffins, matzo. Soda, saltine, or graham crackers. Pretzels, melba toast, zwieback. Cooked cereals made with water: cornmeal, farina, cream cereals. Dry cereals: refined corn,  wheat, rice. Potatoes prepared any way without skins, refined macaroni, spaghetti, noodles, refined rice.   Avoid:  Bread, rolls, or crackers made with whole wheat, multi-grains, rye, bran seeds, nuts, or coconut. Corn tortillas or taco shells. Cereals containing whole grains, multi-grains, bran, coconut, nuts, or raisins. Cooked or dry oatmeal. Coarse wheat cereals, granola. Cereals advertised as "high-fiber." Potato skins. Whole grain pasta, wild or brown rice. Popcorn. Sweet potatoes/yams. Sweet rolls, doughnuts, waffles, pancakes, sweet breads.  Vegetables  Allowed: Strained tomato and vegetable juices. Most well-cooked and canned vegetables without seeds. Fresh: Tender lettuce, cucumber without the skin, cabbage, spinach, bean sprouts.   Avoid: Fresh, cooked, or canned: Artichokes, baked beans, beet greens, broccoli, Brussels sprouts, corn, kale, legumes, peas, sweet potatoes. Cooked: Green or red cabbage, spinach. Avoid large servings of any vegetables, because vegetables shrink when cooked, and they contain more fiber per serving than fresh vegetables.  Fruit  Allowed: All fruit juices except prune juice. Cooked or canned: Apricots, applesauce, cantaloupe, cherries, fruit cocktail, grapefruit, grapes, kiwi, mandarin oranges, peaches, pears, plums, watermelon. Fresh: Apples without skin, ripe banana, grapes, cantaloupe, cherries, grapefruit, peaches, oranges, plums. Keep servings limited to  cup or 1 piece.   Avoid: Fresh: Apple with skin, apricots, mango, pears, raspberries, strawberries. Prune juice, stewed or dried prunes. Dried fruits, raisins, dates. Large servings of all fresh fruits.  Meat and Meat Substitutes  Allowed: Ground or well-cooked tender beef, ham, veal, lamb, pork, or poultry. Eggs, plain cheese. Fish, oysters, shrimp, lobster, other seafoods. Liver, organ meats.   Avoid: Tough, fibrous meats with gristle. Peanut butter, smooth or chunky. Cheese, nuts, seeds, legumes, dried  peas, beans, lentils.  Milk  Allowed: Yogurt, lactose-free milk, kefir, drinkable yogurt, buttermilk, soy milk.   Avoid: Milk, chocolate milk, beverages made with milk, such as milk shakes.  Soups  Allowed: Bouillon, broth, or soups made from allowed foods. Any strained soup.   Avoid: Soups made from vegetables that are not allowed, cream or milk-based soups.  Desserts and Sweets  Allowed: Sugar-free gelatin, sugar-free frozen ice pops made without sugar alcohol.   Avoid: Plain cakes and cookies, pie made with allowed fruit, pudding, custard, cream pie. Gelatin, fruit, ice, sherbet, frozen ice pops. Ice cream, ice milk without nuts. Plain hard candy, honey, jelly, molasses, syrup, sugar, chocolate syrup, gumdrops, marshmallows.  Fats and Oils  Allowed: Avoid any fats and oils.   Avoid: Seeds, nuts, olives, avocados. Margarine, butter, cream, mayonnaise, salad oils, plain salad dressings made from allowed foods. Plain gravy, crisp bacon without rind.  Beverages  Allowed: Water, decaffeinated teas, oral rehydration solutions, sugar-free beverages.   Avoid: Fruit juices, caffeinated beverages (coffee, tea, soda or pop), alcohol, sports drinks, or lemon-lime soda or pop.  Condiments  Allowed: Ketchup, mustard, horseradish, vinegar, cream sauce, cheese sauce, cocoa powder. Spices in moderation: allspice, basil, bay leaves, celery powder or leaves, cinnamon, cumin powder, curry powder, ginger, mace, marjoram, onion or garlic powder, oregano, paprika, parsley flakes, ground  pepper, rosemary, sage, savory, tarragon, thyme, turmeric.   Avoid: Coconut, honey.  Weight Monitoring: Weigh yourself every day. You should weigh yourself in the morning after you urinate and before you eat breakfast. Wear the same amount of clothing when you weigh yourself. Record your weight daily. Bring your recorded weights to your clinic visits. Tell your caregiver right away if you have gained 3 lb/1.4 kg or more in  1 day, 5 lb/2.3 kg in a week, or whatever amount you were told to report. SEEK IMMEDIATE MEDICAL CARE IF:   You are unable to keep fluids down.   You start to throw up (vomit) or diarrhea keeps coming back (persistent).   Abdominal pain develops, increases, or can be felt in one place (localizes).   You have an oral temperature above 102 F (38.9 C), not controlled by medicine.   Diarrhea contains blood or mucus.   You develop excessive weakness, dizziness, fainting, or extreme thirst.  MAKE SURE YOU:   Understand these instructions.   Will watch your condition.   Will get help right away if you are not doing well or get worse.  Document Released: 01/30/2004 Document Revised: 10/29/2011 Document Reviewed: 05/23/2009 [facility] Patient Information 2012 [place], [place].Diarrhea Infections caused by germs (bacterial) or a virus commonly cause diarrhea. Your caregiver has determined that with time, rest and fluids, the diarrhea should improve. In general, eat normally while drinking more water than usual. Although water may prevent dehydration, it does not contain salt and minerals (electrolytes). Broths, weak tea without caffeine and oral rehydration solutions (ORS) replace fluids and electrolytes. Small amounts of fluids should be taken frequently. Large amounts at one time may not be tolerated. Plain water may be harmful in infants and the elderly. Oral rehydrating solutions (ORS) are available at pharmacies and grocery stores. ORS replace water and important electrolytes in proper proportions. Sports drinks are not as effective as ORS and may be harmful due to sugars worsening diarrhea.  ORS is especially recommended for use in children with diarrhea. As a general guideline for children, replace any new fluid losses from diarrhea and/or vomiting with ORS as follows:   If your child weighs 22 pounds or under (10 kg or less), give 60-120 mL ( -  cup or 2 - 4 ounces) of ORS for each episode  of diarrheal stool or vomiting episode.   If your child weighs more than 22 pounds (more than 10 kgs), give 120-240 mL ( - 1 cup or 4 - 8 ounces) of ORS for each diarrheal stool or episode of vomiting.   While correcting for dehydration, children should eat normally. However, foods high in sugar should be avoided because this may worsen diarrhea. Large amounts of carbonated soft drinks, juice, gelatin desserts and other highly sugared drinks should be avoided.   After correction of dehydration, other liquids that are appealing to the child may be added. Children should drink small amounts of fluids frequently and fluids should be increased as tolerated. Children should drink enough fluids to keep urine clear or pale yellow.   Adults should eat normally while drinking more fluids than usual. Drink small amounts of fluids frequently and increase as tolerated. Drink enough fluids to keep urine clear or pale yellow. Broths, weak decaffeinated tea, lemon lime soft drinks (allowed to go flat) and ORS replace fluids and electrolytes.   Avoid:   Carbonated drinks.   Juice.   Extremely hot or cold fluids.   Caffeine drinks.   Fatty, greasy  foods.   Alcohol.   Tobacco.   Too much intake of anything at one time.   Gelatin desserts.   Probiotics are active cultures of beneficial bacteria. They may lessen the amount and number of diarrheal stools in adults. Probiotics can be found in yogurt with active cultures and in supplements.   Wash hands well to avoid spreading bacteria and virus.   Anti-diarrheal medications are not recommended for infants and children.   Only take over-the-counter or prescription medicines for pain, discomfort or fever as directed by your caregiver. Do not give aspirin to children because it may cause Reye's Syndrome.   For adults, ask your caregiver if you should continue all prescribed and over-the-counter medicines.   If your caregiver has given you a follow-up  appointment, it is very important to keep that appointment. Not keeping the appointment could result in a chronic or permanent injury, and disability. If there is any problem keeping the appointment, you must call back to this facility for assistance.  SEEK IMMEDIATE MEDICAL CARE IF:   You or your child is unable to keep fluids down or other symptoms or problems become worse in spite of treatment.   Vomiting or diarrhea develops and becomes persistent.   There is vomiting of blood or bile (green material).   There is blood in the stool or the stools are black and tarry.   There is no urine output in 6-8 hours or there is only a small amount of very dark urine.   Abdominal pain develops, increases or localizes.   You have a fever.   Your baby is older than 3 months with a rectal temperature of 102 F (38.9 C) or higher.   Your baby is 96 months old or younger with a rectal temperature of 100.4 F (38 C) or higher.   You or your child develops excessive weakness, dizziness, fainting or extreme thirst.   You or your child develops a rash, stiff neck, severe headache or become irritable or sleepy and difficult to awaken.  MAKE SURE YOU:   Understand these instructions.   Will watch your condition.   Will get help right away if you are not doing well or get worse.  Document Released: 10/30/2002 Document Revised: 10/29/2011 Document Reviewed: 09/16/2009 [facility] Patient Information 2012 [place], [place].

## 2012-01-27 NOTE — ED Provider Notes (Signed)
Medical screening examination/treatment/procedure(s) were conducted as a shared visit with non-physician practitioner(s) and myself.  I personally evaluated the patient during the encounter   Loren Racer, MD 01/27/12 1606

## 2012-02-13 ENCOUNTER — Other Ambulatory Visit: Payer: Self-pay | Admitting: Physician Assistant

## 2012-03-07 DIAGNOSIS — E7800 Pure hypercholesterolemia, unspecified: Secondary | ICD-10-CM | POA: Diagnosis not present

## 2012-03-07 DIAGNOSIS — I10 Essential (primary) hypertension: Secondary | ICD-10-CM | POA: Diagnosis not present

## 2012-04-11 ENCOUNTER — Encounter: Payer: Self-pay | Admitting: Internal Medicine

## 2012-05-13 ENCOUNTER — Other Ambulatory Visit: Payer: Self-pay | Admitting: Physician Assistant

## 2012-05-28 ENCOUNTER — Other Ambulatory Visit: Payer: Self-pay | Admitting: Internal Medicine

## 2012-06-27 ENCOUNTER — Other Ambulatory Visit: Payer: Self-pay | Admitting: Physician Assistant

## 2012-07-04 DIAGNOSIS — I10 Essential (primary) hypertension: Secondary | ICD-10-CM | POA: Diagnosis not present

## 2012-07-04 DIAGNOSIS — R413 Other amnesia: Secondary | ICD-10-CM | POA: Diagnosis not present

## 2012-07-04 DIAGNOSIS — E663 Overweight: Secondary | ICD-10-CM | POA: Diagnosis not present

## 2012-07-04 DIAGNOSIS — E7800 Pure hypercholesterolemia, unspecified: Secondary | ICD-10-CM | POA: Diagnosis not present

## 2012-07-08 ENCOUNTER — Other Ambulatory Visit: Payer: Self-pay | Admitting: Cardiovascular Disease

## 2012-07-13 ENCOUNTER — Other Ambulatory Visit: Payer: Self-pay | Admitting: Physician Assistant

## 2012-07-13 NOTE — Telephone Encounter (Signed)
Chart pulled [id number]

## 2012-07-13 NOTE — Telephone Encounter (Signed)
Final notice. Needs office visit

## 2012-07-18 DIAGNOSIS — I10 Essential (primary) hypertension: Secondary | ICD-10-CM | POA: Diagnosis not present

## 2012-07-18 DIAGNOSIS — E7800 Pure hypercholesterolemia, unspecified: Secondary | ICD-10-CM | POA: Diagnosis not present

## 2012-07-18 DIAGNOSIS — R413 Other amnesia: Secondary | ICD-10-CM | POA: Diagnosis not present

## 2012-08-01 DIAGNOSIS — H25019 Cortical age-related cataract, unspecified eye: Secondary | ICD-10-CM | POA: Diagnosis not present

## 2012-08-01 DIAGNOSIS — E119 Type 2 diabetes mellitus without complications: Secondary | ICD-10-CM | POA: Diagnosis not present

## 2012-08-01 DIAGNOSIS — H4010X0 Unspecified open-angle glaucoma, stage unspecified: Secondary | ICD-10-CM | POA: Diagnosis not present

## 2012-08-01 DIAGNOSIS — H409 Unspecified glaucoma: Secondary | ICD-10-CM | POA: Diagnosis not present

## 2012-08-15 DIAGNOSIS — I10 Essential (primary) hypertension: Secondary | ICD-10-CM | POA: Diagnosis not present

## 2012-11-07 DIAGNOSIS — E663 Overweight: Secondary | ICD-10-CM | POA: Diagnosis not present

## 2012-11-07 DIAGNOSIS — I10 Essential (primary) hypertension: Secondary | ICD-10-CM | POA: Diagnosis not present

## 2012-11-07 DIAGNOSIS — E7800 Pure hypercholesterolemia, unspecified: Secondary | ICD-10-CM | POA: Diagnosis not present

## 2012-11-20 DIAGNOSIS — Z23 Encounter for immunization: Secondary | ICD-10-CM | POA: Diagnosis not present

## 2013-03-06 DIAGNOSIS — H2510 Age-related nuclear cataract, unspecified eye: Secondary | ICD-10-CM | POA: Diagnosis not present

## 2013-03-06 DIAGNOSIS — I10 Essential (primary) hypertension: Secondary | ICD-10-CM | POA: Diagnosis not present

## 2013-03-06 DIAGNOSIS — H409 Unspecified glaucoma: Secondary | ICD-10-CM | POA: Diagnosis not present

## 2013-03-06 DIAGNOSIS — E119 Type 2 diabetes mellitus without complications: Secondary | ICD-10-CM | POA: Diagnosis not present

## 2013-03-06 DIAGNOSIS — E7800 Pure hypercholesterolemia, unspecified: Secondary | ICD-10-CM | POA: Diagnosis not present

## 2013-03-06 DIAGNOSIS — H4011X0 Primary open-angle glaucoma, stage unspecified: Secondary | ICD-10-CM | POA: Diagnosis not present

## 2013-05-04 ENCOUNTER — Other Ambulatory Visit: Payer: Self-pay | Admitting: Physician Assistant

## 2013-07-03 DIAGNOSIS — I10 Essential (primary) hypertension: Secondary | ICD-10-CM | POA: Diagnosis not present

## 2013-07-03 DIAGNOSIS — E7800 Pure hypercholesterolemia, unspecified: Secondary | ICD-10-CM | POA: Diagnosis not present

## 2013-09-04 DIAGNOSIS — Z23 Encounter for immunization: Secondary | ICD-10-CM | POA: Diagnosis not present

## 2013-09-04 DIAGNOSIS — H25019 Cortical age-related cataract, unspecified eye: Secondary | ICD-10-CM | POA: Diagnosis not present

## 2013-09-04 DIAGNOSIS — H4011X0 Primary open-angle glaucoma, stage unspecified: Secondary | ICD-10-CM | POA: Diagnosis not present

## 2013-09-04 DIAGNOSIS — H2510 Age-related nuclear cataract, unspecified eye: Secondary | ICD-10-CM | POA: Diagnosis not present

## 2013-09-04 DIAGNOSIS — H409 Unspecified glaucoma: Secondary | ICD-10-CM | POA: Diagnosis not present

## 2013-10-30 DIAGNOSIS — E7800 Pure hypercholesterolemia, unspecified: Secondary | ICD-10-CM | POA: Diagnosis not present

## 2013-10-30 DIAGNOSIS — E119 Type 2 diabetes mellitus without complications: Secondary | ICD-10-CM | POA: Diagnosis not present

## 2013-10-30 DIAGNOSIS — I10 Essential (primary) hypertension: Secondary | ICD-10-CM | POA: Diagnosis not present

## 2013-10-30 DIAGNOSIS — Z23 Encounter for immunization: Secondary | ICD-10-CM | POA: Diagnosis not present

## 2013-12-15 IMAGING — CR DG ABDOMEN 2V
2 series · 2 of 2 positions shown · non-contrast
Comparison: None.

CLINICAL DATA: Fever.  Diarrhea.

ABDOMEN - 2 VIEW

[w abdomen upright]
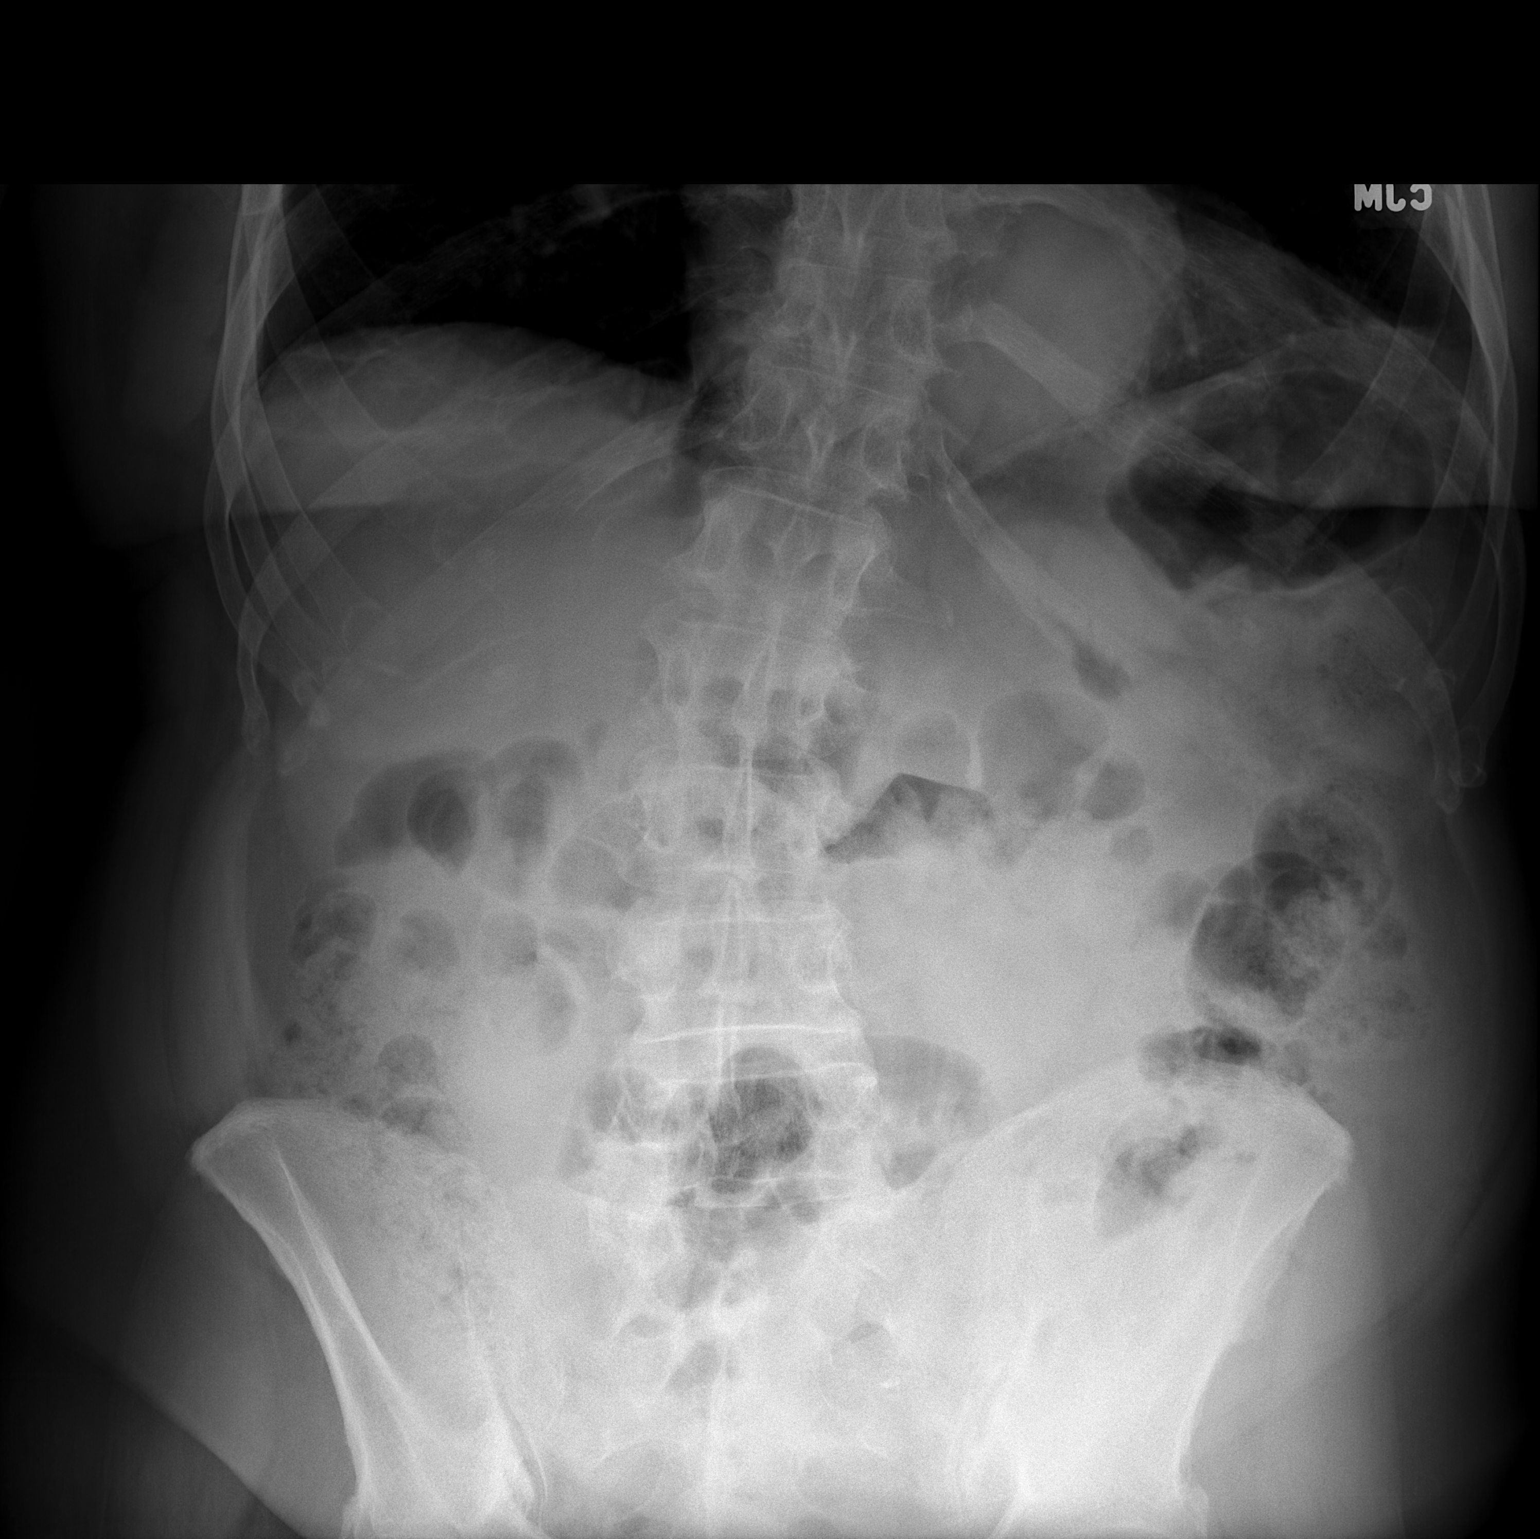

[t abdomen supine]
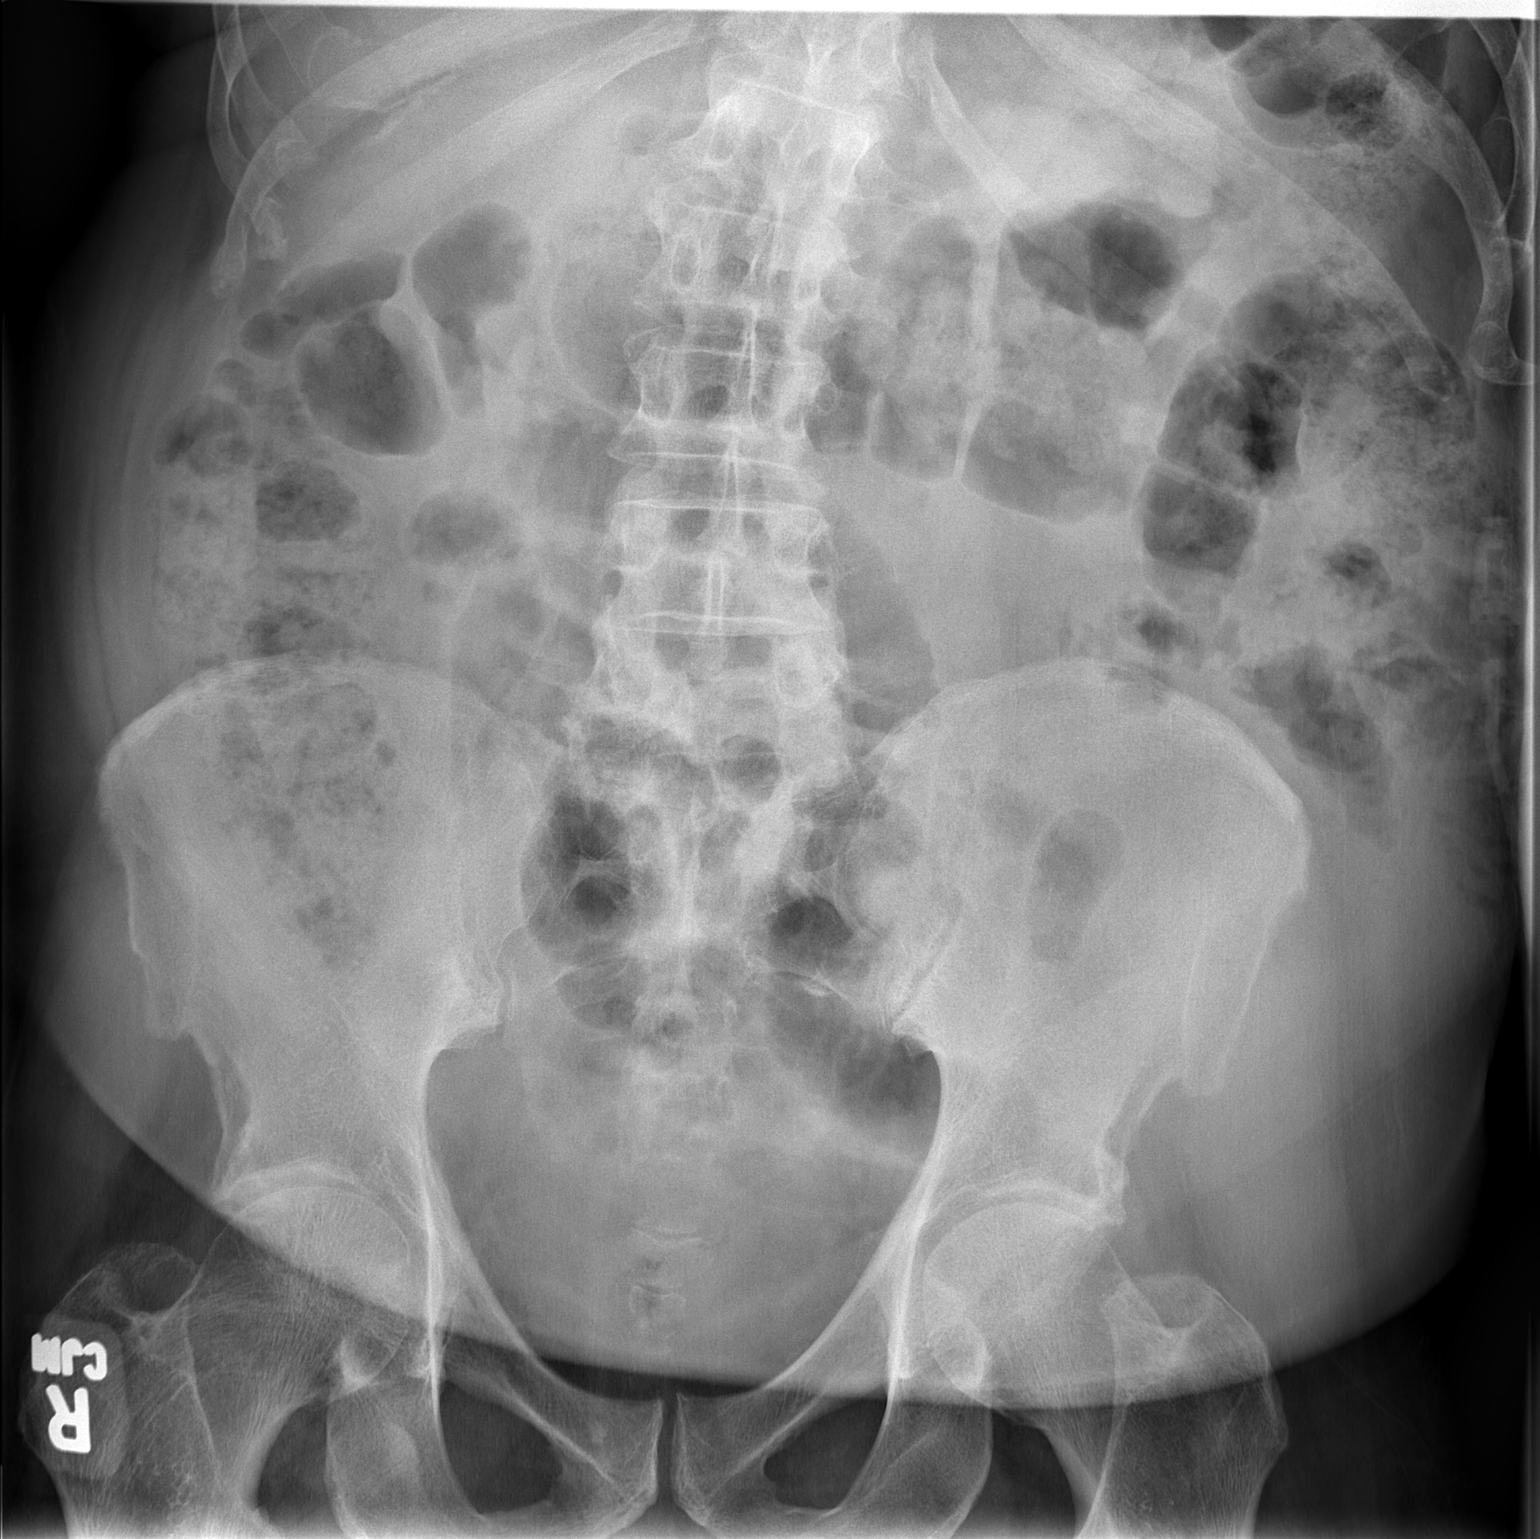

[2 of 2 positions shown; findings below may reference images not displayed]

FINDINGS: No free air in the abdomen.  Air scattered throughout
nondistended loops of large and small bowel.  No worrisome
abdominal calcifications.  No acute osseous abnormality.  Slight
thoracolumbar scoliosis.
IMPRESSION: No acute abnormalities.

## 2013-12-15 IMAGING — CR DG CHEST 2V
2 series · 2 of 2 positions shown · non-contrast
Comparison: None.

CLINICAL DATA: Fever.  Diarrhea.

CHEST - 2 VIEW

[w chest pa]
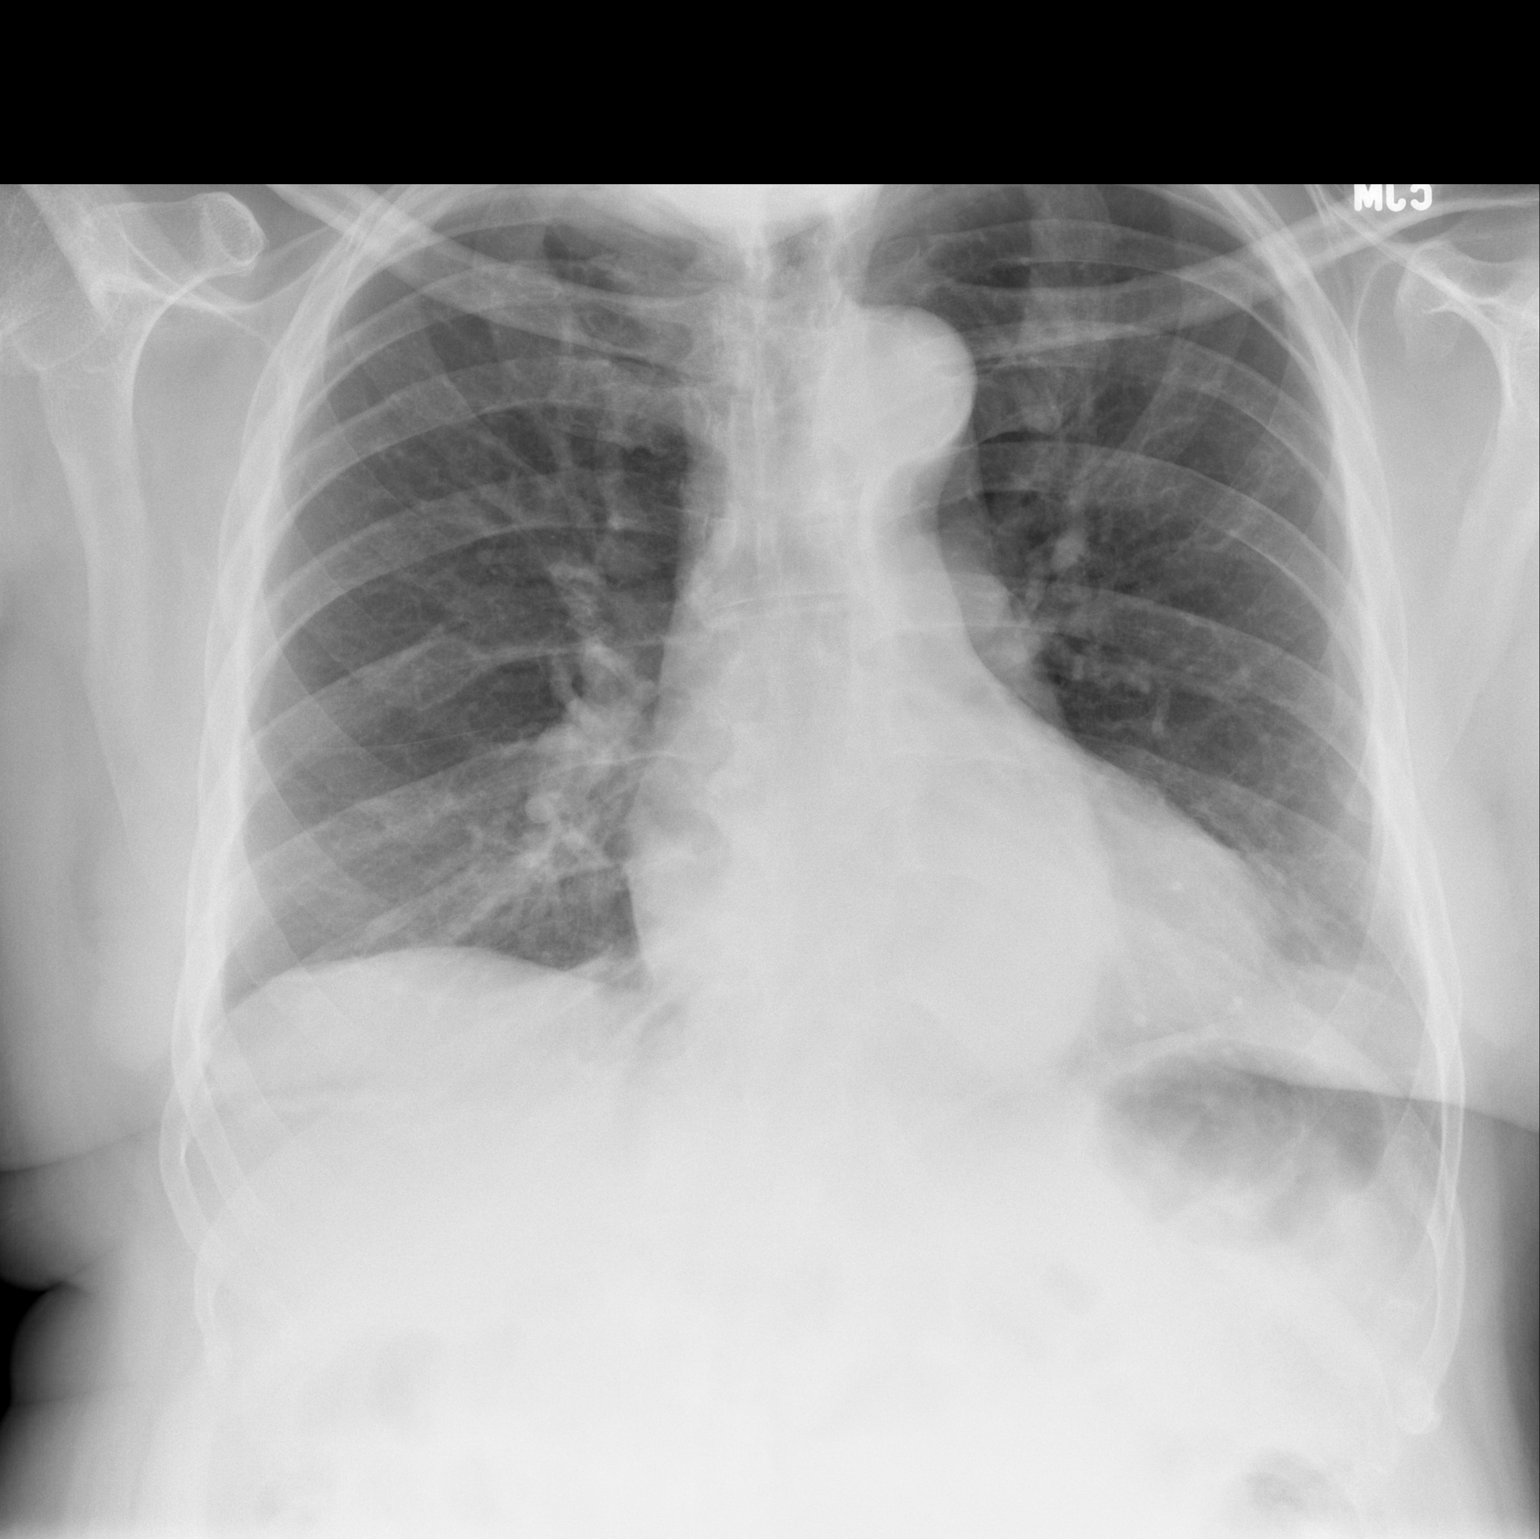

[w chest lat]
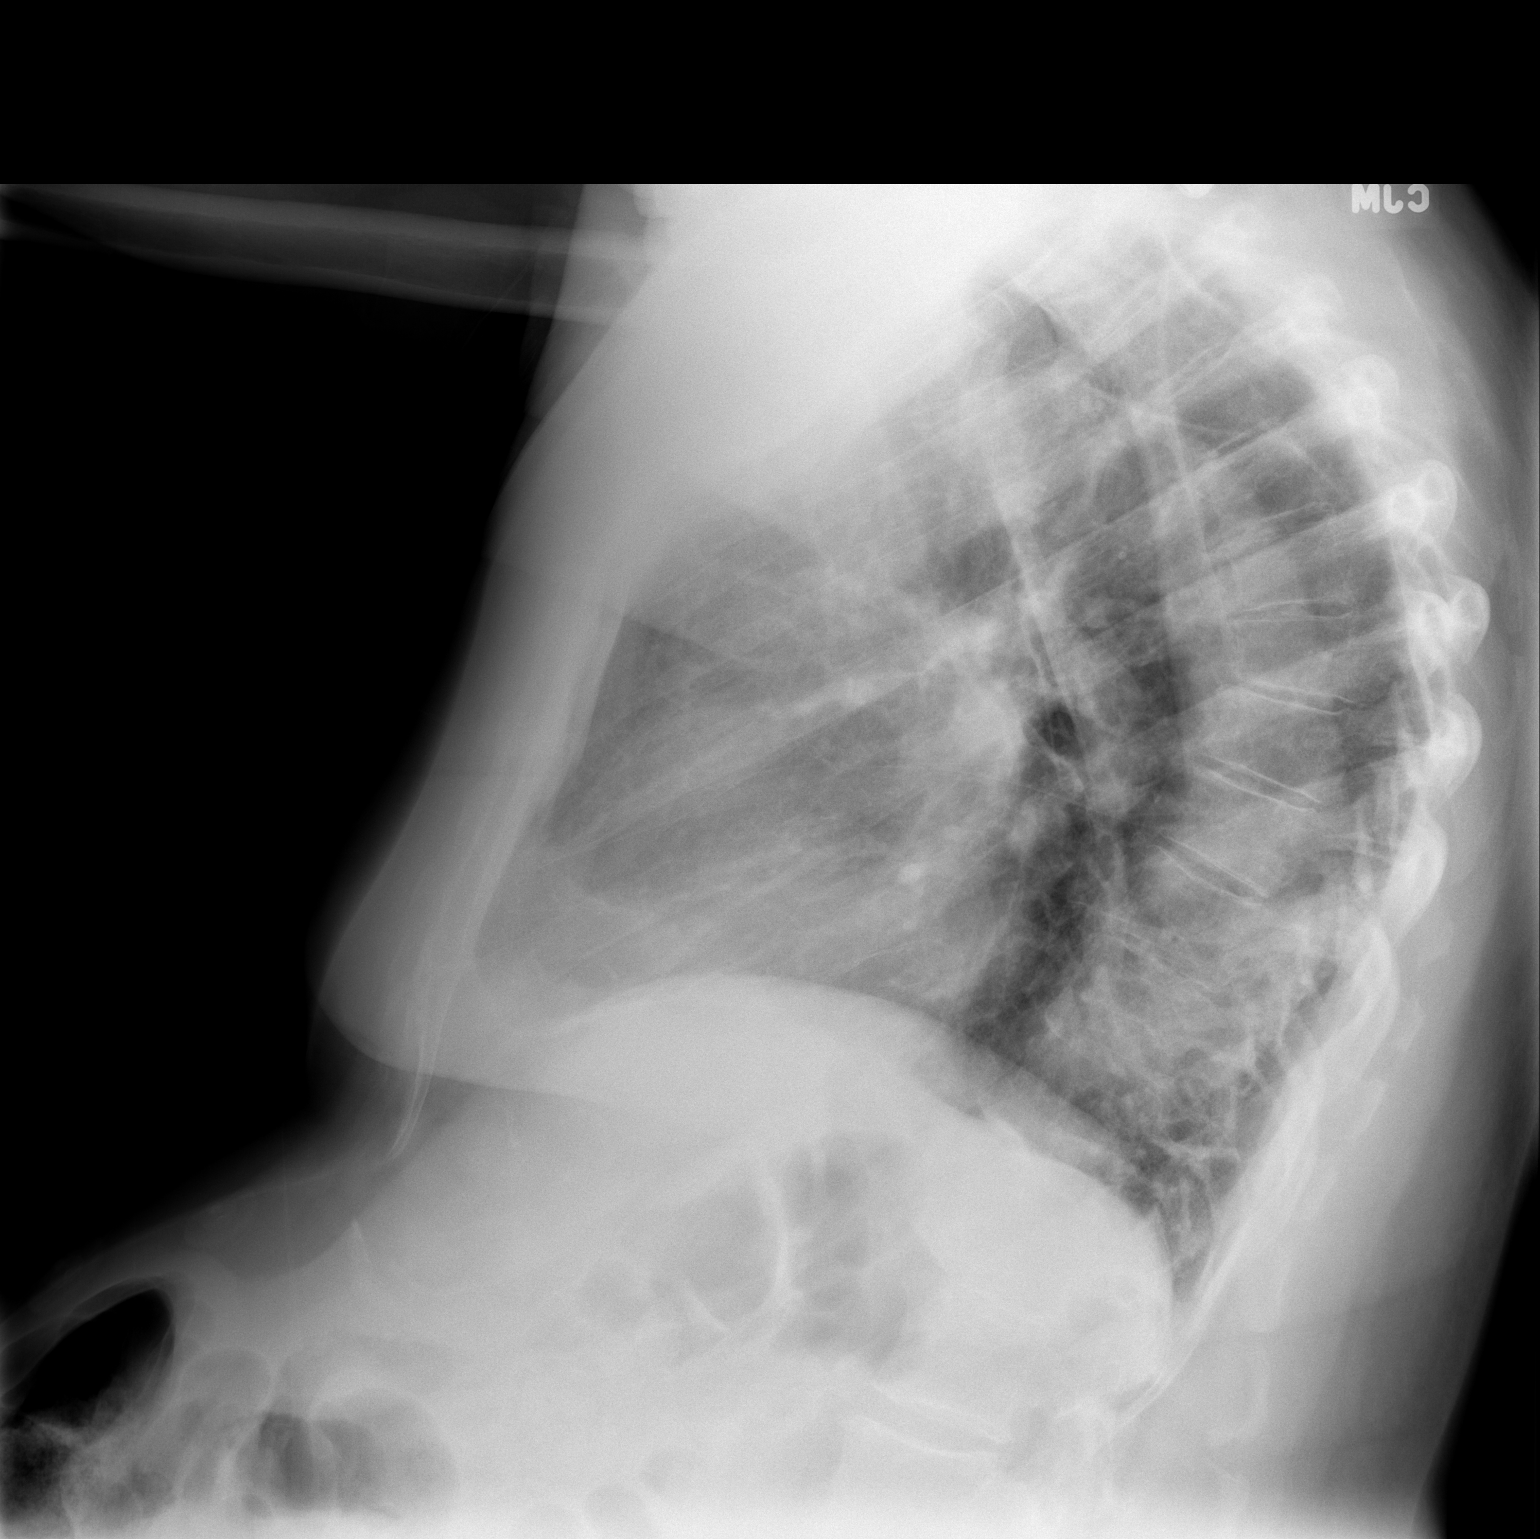

[2 of 2 positions shown; findings below may reference images not displayed]

FINDINGS: Heart size and pulmonary vascularity are normal.
Thoracic aorta is quite tortuous.  Slight linear atelectasis or
scarring at the right lung base.  No effusions.  Accentuation of
the upper thoracic kyphosis.
IMPRESSION: Slight atelectasis or scarring at the right base.

## 2014-03-05 DIAGNOSIS — E119 Type 2 diabetes mellitus without complications: Secondary | ICD-10-CM | POA: Diagnosis not present

## 2014-03-05 DIAGNOSIS — I10 Essential (primary) hypertension: Secondary | ICD-10-CM | POA: Diagnosis not present

## 2014-03-05 DIAGNOSIS — E7800 Pure hypercholesterolemia, unspecified: Secondary | ICD-10-CM | POA: Diagnosis not present

## 2014-03-12 DIAGNOSIS — H4011X0 Primary open-angle glaucoma, stage unspecified: Secondary | ICD-10-CM | POA: Diagnosis not present

## 2014-03-12 DIAGNOSIS — H409 Unspecified glaucoma: Secondary | ICD-10-CM | POA: Diagnosis not present

## 2014-03-12 DIAGNOSIS — E119 Type 2 diabetes mellitus without complications: Secondary | ICD-10-CM | POA: Diagnosis not present

## 2014-06-25 DIAGNOSIS — Z1331 Encounter for screening for depression: Secondary | ICD-10-CM | POA: Diagnosis not present

## 2014-06-25 DIAGNOSIS — E7800 Pure hypercholesterolemia, unspecified: Secondary | ICD-10-CM | POA: Diagnosis not present

## 2014-06-25 DIAGNOSIS — E119 Type 2 diabetes mellitus without complications: Secondary | ICD-10-CM | POA: Diagnosis not present

## 2014-06-25 DIAGNOSIS — R609 Edema, unspecified: Secondary | ICD-10-CM | POA: Diagnosis not present

## 2014-06-25 DIAGNOSIS — Z0000 Encounter for general adult medical examination without abnormal findings: Secondary | ICD-10-CM | POA: Diagnosis not present

## 2014-06-25 DIAGNOSIS — I10 Essential (primary) hypertension: Secondary | ICD-10-CM | POA: Diagnosis not present

## 2014-06-25 DIAGNOSIS — Z125 Encounter for screening for malignant neoplasm of prostate: Secondary | ICD-10-CM | POA: Diagnosis not present

## 2014-07-11 ENCOUNTER — Encounter: Payer: Self-pay | Admitting: Gastroenterology

## 2014-08-01 ENCOUNTER — Encounter: Payer: Self-pay | Admitting: Gastroenterology

## 2014-09-03 DIAGNOSIS — H4011X1 Primary open-angle glaucoma, mild stage: Secondary | ICD-10-CM | POA: Diagnosis not present

## 2014-09-03 DIAGNOSIS — H2513 Age-related nuclear cataract, bilateral: Secondary | ICD-10-CM | POA: Diagnosis not present

## 2014-10-08 DIAGNOSIS — Z23 Encounter for immunization: Secondary | ICD-10-CM | POA: Diagnosis not present

## 2014-10-15 DIAGNOSIS — H04123 Dry eye syndrome of bilateral lacrimal glands: Secondary | ICD-10-CM | POA: Diagnosis not present

## 2014-10-15 DIAGNOSIS — H4011X2 Primary open-angle glaucoma, moderate stage: Secondary | ICD-10-CM | POA: Diagnosis not present

## 2014-10-29 DIAGNOSIS — Z23 Encounter for immunization: Secondary | ICD-10-CM | POA: Diagnosis not present

## 2014-10-29 DIAGNOSIS — I10 Essential (primary) hypertension: Secondary | ICD-10-CM | POA: Diagnosis not present

## 2014-10-29 DIAGNOSIS — E780 Pure hypercholesterolemia: Secondary | ICD-10-CM | POA: Diagnosis not present

## 2014-10-29 DIAGNOSIS — E119 Type 2 diabetes mellitus without complications: Secondary | ICD-10-CM | POA: Diagnosis not present

## 2015-01-14 DIAGNOSIS — H4011X2 Primary open-angle glaucoma, moderate stage: Secondary | ICD-10-CM | POA: Diagnosis not present

## 2015-01-21 ENCOUNTER — Encounter: Payer: Self-pay | Admitting: Cardiovascular Disease

## 2015-01-21 ENCOUNTER — Encounter: Payer: Self-pay | Admitting: Cardiology

## 2015-02-25 ENCOUNTER — Encounter: Payer: Self-pay | Admitting: Gastroenterology

## 2015-02-25 DIAGNOSIS — E780 Pure hypercholesterolemia: Secondary | ICD-10-CM | POA: Diagnosis not present

## 2015-02-25 DIAGNOSIS — R600 Localized edema: Secondary | ICD-10-CM | POA: Diagnosis not present

## 2015-02-25 DIAGNOSIS — I10 Essential (primary) hypertension: Secondary | ICD-10-CM | POA: Diagnosis not present

## 2015-02-25 DIAGNOSIS — E119 Type 2 diabetes mellitus without complications: Secondary | ICD-10-CM | POA: Diagnosis not present

## 2015-03-11 DIAGNOSIS — H4011X2 Primary open-angle glaucoma, moderate stage: Secondary | ICD-10-CM | POA: Diagnosis not present

## 2015-07-22 DIAGNOSIS — Z1389 Encounter for screening for other disorder: Secondary | ICD-10-CM | POA: Diagnosis not present

## 2015-07-22 DIAGNOSIS — E780 Pure hypercholesterolemia: Secondary | ICD-10-CM | POA: Diagnosis not present

## 2015-07-22 DIAGNOSIS — Z0000 Encounter for general adult medical examination without abnormal findings: Secondary | ICD-10-CM | POA: Diagnosis not present

## 2015-07-22 DIAGNOSIS — Z125 Encounter for screening for malignant neoplasm of prostate: Secondary | ICD-10-CM | POA: Diagnosis not present

## 2015-07-22 DIAGNOSIS — E119 Type 2 diabetes mellitus without complications: Secondary | ICD-10-CM | POA: Diagnosis not present

## 2015-07-22 DIAGNOSIS — I10 Essential (primary) hypertension: Secondary | ICD-10-CM | POA: Diagnosis not present

## 2015-07-22 DIAGNOSIS — R600 Localized edema: Secondary | ICD-10-CM | POA: Diagnosis not present

## 2015-08-05 DIAGNOSIS — H4011X2 Primary open-angle glaucoma, moderate stage: Secondary | ICD-10-CM | POA: Diagnosis not present

## 2015-09-09 DIAGNOSIS — Z23 Encounter for immunization: Secondary | ICD-10-CM | POA: Diagnosis not present

## 2015-09-16 DIAGNOSIS — H401132 Primary open-angle glaucoma, bilateral, moderate stage: Secondary | ICD-10-CM | POA: Diagnosis not present

## 2015-10-28 DIAGNOSIS — H401112 Primary open-angle glaucoma, right eye, moderate stage: Secondary | ICD-10-CM | POA: Diagnosis not present

## 2015-10-28 DIAGNOSIS — H401122 Primary open-angle glaucoma, left eye, moderate stage: Secondary | ICD-10-CM | POA: Diagnosis not present

## 2015-12-16 DIAGNOSIS — I10 Essential (primary) hypertension: Secondary | ICD-10-CM | POA: Diagnosis not present

## 2015-12-16 DIAGNOSIS — E119 Type 2 diabetes mellitus without complications: Secondary | ICD-10-CM | POA: Diagnosis not present

## 2015-12-16 DIAGNOSIS — I8310 Varicose veins of unspecified lower extremity with inflammation: Secondary | ICD-10-CM | POA: Diagnosis not present

## 2015-12-16 DIAGNOSIS — R5383 Other fatigue: Secondary | ICD-10-CM | POA: Diagnosis not present

## 2015-12-16 DIAGNOSIS — E7800 Pure hypercholesterolemia, unspecified: Secondary | ICD-10-CM | POA: Diagnosis not present

## 2015-12-16 DIAGNOSIS — R600 Localized edema: Secondary | ICD-10-CM | POA: Diagnosis not present

## 2015-12-16 DIAGNOSIS — E1165 Type 2 diabetes mellitus with hyperglycemia: Secondary | ICD-10-CM | POA: Diagnosis not present

## 2015-12-16 DIAGNOSIS — Z7984 Long term (current) use of oral hypoglycemic drugs: Secondary | ICD-10-CM | POA: Diagnosis not present

## 2016-01-16 ENCOUNTER — Emergency Department (HOSPITAL_COMMUNITY)
Admission: EM | Admit: 2016-01-16 | Discharge: 2016-01-16 | Discharge status: Absent without leave | Payer: BLUE CROSS/BLUE SHIELD

## 2016-02-03 DIAGNOSIS — Z0100 Encounter for examination of eyes and vision without abnormal findings: Secondary | ICD-10-CM | POA: Diagnosis not present

## 2016-02-03 DIAGNOSIS — H401112 Primary open-angle glaucoma, right eye, moderate stage: Secondary | ICD-10-CM | POA: Diagnosis not present

## 2016-02-03 DIAGNOSIS — H401122 Primary open-angle glaucoma, left eye, moderate stage: Secondary | ICD-10-CM | POA: Diagnosis not present

## 2016-04-13 DIAGNOSIS — E119 Type 2 diabetes mellitus without complications: Secondary | ICD-10-CM | POA: Diagnosis not present

## 2016-04-13 DIAGNOSIS — R600 Localized edema: Secondary | ICD-10-CM | POA: Diagnosis not present

## 2016-04-13 DIAGNOSIS — I8310 Varicose veins of unspecified lower extremity with inflammation: Secondary | ICD-10-CM | POA: Diagnosis not present

## 2016-04-13 DIAGNOSIS — R5383 Other fatigue: Secondary | ICD-10-CM | POA: Diagnosis not present

## 2016-04-13 DIAGNOSIS — I10 Essential (primary) hypertension: Secondary | ICD-10-CM | POA: Diagnosis not present

## 2016-04-13 DIAGNOSIS — E7800 Pure hypercholesterolemia, unspecified: Secondary | ICD-10-CM | POA: Diagnosis not present

## 2016-04-13 DIAGNOSIS — Z7984 Long term (current) use of oral hypoglycemic drugs: Secondary | ICD-10-CM | POA: Diagnosis not present

## 2016-06-29 DIAGNOSIS — H401122 Primary open-angle glaucoma, left eye, moderate stage: Secondary | ICD-10-CM | POA: Diagnosis not present

## 2016-06-29 DIAGNOSIS — H04123 Dry eye syndrome of bilateral lacrimal glands: Secondary | ICD-10-CM | POA: Diagnosis not present

## 2016-06-29 DIAGNOSIS — H401112 Primary open-angle glaucoma, right eye, moderate stage: Secondary | ICD-10-CM | POA: Diagnosis not present

## 2016-08-03 ENCOUNTER — Ambulatory Visit
Admission: RE | Admit: 2016-08-03 | Discharge: 2016-08-03 | Disposition: A | Discharge status: Home / Self Care | Payer: Medicare Other | Source: Ambulatory Visit | Attending: Internal Medicine | Admitting: Internal Medicine

## 2016-08-03 ENCOUNTER — Other Ambulatory Visit: Payer: Self-pay | Admitting: Internal Medicine

## 2016-08-03 DIAGNOSIS — R600 Localized edema: Secondary | ICD-10-CM | POA: Diagnosis not present

## 2016-08-03 DIAGNOSIS — Z7984 Long term (current) use of oral hypoglycemic drugs: Secondary | ICD-10-CM | POA: Diagnosis not present

## 2016-08-03 DIAGNOSIS — Z1389 Encounter for screening for other disorder: Secondary | ICD-10-CM | POA: Diagnosis not present

## 2016-08-03 DIAGNOSIS — M25559 Pain in unspecified hip: Secondary | ICD-10-CM

## 2016-08-03 DIAGNOSIS — E119 Type 2 diabetes mellitus without complications: Secondary | ICD-10-CM | POA: Diagnosis not present

## 2016-08-03 DIAGNOSIS — Z0000 Encounter for general adult medical examination without abnormal findings: Secondary | ICD-10-CM | POA: Diagnosis not present

## 2016-08-03 DIAGNOSIS — M1611 Unilateral primary osteoarthritis, right hip: Secondary | ICD-10-CM | POA: Diagnosis not present

## 2016-08-03 DIAGNOSIS — I8310 Varicose veins of unspecified lower extremity with inflammation: Secondary | ICD-10-CM | POA: Diagnosis not present

## 2016-08-03 DIAGNOSIS — E7800 Pure hypercholesterolemia, unspecified: Secondary | ICD-10-CM | POA: Diagnosis not present

## 2016-08-03 DIAGNOSIS — I10 Essential (primary) hypertension: Secondary | ICD-10-CM | POA: Diagnosis not present

## 2016-08-24 DIAGNOSIS — Z23 Encounter for immunization: Secondary | ICD-10-CM | POA: Diagnosis not present

## 2016-09-14 DIAGNOSIS — H401132 Primary open-angle glaucoma, bilateral, moderate stage: Secondary | ICD-10-CM | POA: Diagnosis not present

## 2016-12-07 ENCOUNTER — Other Ambulatory Visit: Payer: Self-pay | Admitting: Internal Medicine

## 2016-12-07 ENCOUNTER — Ambulatory Visit
Admission: RE | Admit: 2016-12-07 | Discharge: 2016-12-07 | Disposition: A | Discharge status: Home / Self Care | Payer: Medicare Other | Source: Ambulatory Visit | Attending: Internal Medicine | Admitting: Internal Medicine

## 2016-12-07 DIAGNOSIS — M79662 Pain in left lower leg: Secondary | ICD-10-CM | POA: Diagnosis not present

## 2016-12-07 DIAGNOSIS — M79605 Pain in left leg: Secondary | ICD-10-CM

## 2016-12-07 DIAGNOSIS — E7800 Pure hypercholesterolemia, unspecified: Secondary | ICD-10-CM | POA: Diagnosis not present

## 2016-12-07 DIAGNOSIS — M7989 Other specified soft tissue disorders: Secondary | ICD-10-CM | POA: Diagnosis not present

## 2016-12-07 DIAGNOSIS — E119 Type 2 diabetes mellitus without complications: Secondary | ICD-10-CM | POA: Diagnosis not present

## 2016-12-07 DIAGNOSIS — I10 Essential (primary) hypertension: Secondary | ICD-10-CM | POA: Diagnosis not present

## 2016-12-07 DIAGNOSIS — Z7984 Long term (current) use of oral hypoglycemic drugs: Secondary | ICD-10-CM | POA: Diagnosis not present

## 2017-01-18 DIAGNOSIS — H401131 Primary open-angle glaucoma, bilateral, mild stage: Secondary | ICD-10-CM | POA: Diagnosis not present

## 2017-04-05 DIAGNOSIS — Z6832 Body mass index (BMI) 32.0-32.9, adult: Secondary | ICD-10-CM | POA: Diagnosis not present

## 2017-04-05 DIAGNOSIS — E663 Overweight: Secondary | ICD-10-CM | POA: Diagnosis not present

## 2017-04-05 DIAGNOSIS — E119 Type 2 diabetes mellitus without complications: Secondary | ICD-10-CM | POA: Diagnosis not present

## 2017-04-05 DIAGNOSIS — I10 Essential (primary) hypertension: Secondary | ICD-10-CM | POA: Diagnosis not present

## 2017-04-05 DIAGNOSIS — E7800 Pure hypercholesterolemia, unspecified: Secondary | ICD-10-CM | POA: Diagnosis not present

## 2017-04-05 DIAGNOSIS — Z7984 Long term (current) use of oral hypoglycemic drugs: Secondary | ICD-10-CM | POA: Diagnosis not present

## 2017-10-11 DIAGNOSIS — H401132 Primary open-angle glaucoma, bilateral, moderate stage: Secondary | ICD-10-CM | POA: Diagnosis not present

## 2017-10-25 DIAGNOSIS — I10 Essential (primary) hypertension: Secondary | ICD-10-CM | POA: Diagnosis not present

## 2017-10-25 DIAGNOSIS — E119 Type 2 diabetes mellitus without complications: Secondary | ICD-10-CM | POA: Diagnosis not present

## 2017-10-25 DIAGNOSIS — E7800 Pure hypercholesterolemia, unspecified: Secondary | ICD-10-CM | POA: Diagnosis not present

## 2017-10-25 DIAGNOSIS — Z0000 Encounter for general adult medical examination without abnormal findings: Secondary | ICD-10-CM | POA: Diagnosis not present

## 2017-10-25 DIAGNOSIS — Z7984 Long term (current) use of oral hypoglycemic drugs: Secondary | ICD-10-CM | POA: Diagnosis not present

## 2017-10-25 DIAGNOSIS — E663 Overweight: Secondary | ICD-10-CM | POA: Diagnosis not present

## 2017-10-25 DIAGNOSIS — Z1389 Encounter for screening for other disorder: Secondary | ICD-10-CM | POA: Diagnosis not present

## 2017-10-25 DIAGNOSIS — Z23 Encounter for immunization: Secondary | ICD-10-CM | POA: Diagnosis not present

## 2018-01-17 DIAGNOSIS — H401132 Primary open-angle glaucoma, bilateral, moderate stage: Secondary | ICD-10-CM | POA: Diagnosis not present

## 2018-01-22 DIAGNOSIS — M1711 Unilateral primary osteoarthritis, right knee: Secondary | ICD-10-CM | POA: Diagnosis not present

## 2018-02-14 DIAGNOSIS — M1711 Unilateral primary osteoarthritis, right knee: Secondary | ICD-10-CM | POA: Diagnosis not present

## 2018-04-25 DIAGNOSIS — E7800 Pure hypercholesterolemia, unspecified: Secondary | ICD-10-CM | POA: Diagnosis not present

## 2018-04-25 DIAGNOSIS — I10 Essential (primary) hypertension: Secondary | ICD-10-CM | POA: Diagnosis not present

## 2018-04-25 DIAGNOSIS — Z6832 Body mass index (BMI) 32.0-32.9, adult: Secondary | ICD-10-CM | POA: Diagnosis not present

## 2018-04-25 DIAGNOSIS — E1169 Type 2 diabetes mellitus with other specified complication: Secondary | ICD-10-CM | POA: Diagnosis not present

## 2018-04-25 DIAGNOSIS — Z7984 Long term (current) use of oral hypoglycemic drugs: Secondary | ICD-10-CM | POA: Diagnosis not present

## 2018-04-25 DIAGNOSIS — E663 Overweight: Secondary | ICD-10-CM | POA: Diagnosis not present

## 2018-04-25 DIAGNOSIS — R5383 Other fatigue: Secondary | ICD-10-CM | POA: Diagnosis not present

## 2018-05-23 DIAGNOSIS — E119 Type 2 diabetes mellitus without complications: Secondary | ICD-10-CM | POA: Diagnosis not present

## 2018-05-23 DIAGNOSIS — H524 Presbyopia: Secondary | ICD-10-CM | POA: Diagnosis not present

## 2018-05-23 DIAGNOSIS — H401132 Primary open-angle glaucoma, bilateral, moderate stage: Secondary | ICD-10-CM | POA: Diagnosis not present

## 2018-05-25 ENCOUNTER — Emergency Department (HOSPITAL_COMMUNITY)
Admission: EM | Admit: 2018-05-25 | Discharge: 2018-05-25 | Disposition: A | Discharge status: Home / Self Care | Payer: Medicare Other | Attending: Emergency Medicine | Admitting: Emergency Medicine

## 2018-05-25 ENCOUNTER — Encounter (HOSPITAL_COMMUNITY): Payer: Self-pay

## 2018-05-25 ENCOUNTER — Other Ambulatory Visit: Payer: Self-pay

## 2018-05-25 ENCOUNTER — Emergency Department (HOSPITAL_COMMUNITY): Payer: Medicare Other

## 2018-05-25 DIAGNOSIS — Z7984 Long term (current) use of oral hypoglycemic drugs: Secondary | ICD-10-CM | POA: Insufficient documentation

## 2018-05-25 DIAGNOSIS — R7989 Other specified abnormal findings of blood chemistry: Secondary | ICD-10-CM | POA: Diagnosis not present

## 2018-05-25 DIAGNOSIS — Z87891 Personal history of nicotine dependence: Secondary | ICD-10-CM | POA: Diagnosis not present

## 2018-05-25 DIAGNOSIS — R42 Dizziness and giddiness: Secondary | ICD-10-CM

## 2018-05-25 DIAGNOSIS — J9811 Atelectasis: Secondary | ICD-10-CM | POA: Diagnosis not present

## 2018-05-25 DIAGNOSIS — Z7982 Long term (current) use of aspirin: Secondary | ICD-10-CM | POA: Insufficient documentation

## 2018-05-25 DIAGNOSIS — I10 Essential (primary) hypertension: Secondary | ICD-10-CM | POA: Insufficient documentation

## 2018-05-25 DIAGNOSIS — E119 Type 2 diabetes mellitus without complications: Secondary | ICD-10-CM | POA: Insufficient documentation

## 2018-05-25 DIAGNOSIS — R5381 Other malaise: Secondary | ICD-10-CM | POA: Diagnosis not present

## 2018-05-25 DIAGNOSIS — Z79899 Other long term (current) drug therapy: Secondary | ICD-10-CM | POA: Diagnosis not present

## 2018-05-25 LAB — CBC WITH DIFFERENTIAL/PLATELET
BASOS ABS: 0 10*3/uL (ref 0.0–0.1)
Basophils Relative: 0 %
EOS PCT: 2 %
Eosinophils Absolute: 0.1 10*3/uL (ref 0.0–0.7)
HEMATOCRIT: 45.6 % (ref 39.0–52.0)
Hemoglobin: 14.8 g/dL (ref 13.0–17.0)
LYMPHS ABS: 1 10*3/uL (ref 0.7–4.0)
LYMPHS PCT: 16 %
MCH: 28.5 pg (ref 26.0–34.0)
MCHC: 32.5 g/dL (ref 30.0–36.0)
MCV: 87.7 fL (ref 78.0–100.0)
MONO ABS: 0.4 10*3/uL (ref 0.1–1.0)
Monocytes Relative: 7 %
NEUTROS ABS: 4.6 10*3/uL (ref 1.7–7.7)
Neutrophils Relative %: 75 %
PLATELETS: 185 10*3/uL (ref 150–400)
RBC: 5.2 MIL/uL (ref 4.22–5.81)
RDW: 13.8 % (ref 11.5–15.5)
WBC: 6.1 10*3/uL (ref 4.0–10.5)

## 2018-05-25 LAB — I-STAT TROPONIN, ED
Troponin i, poc: 0.01 ng/mL (ref 0.00–0.08)
Troponin i, poc: 0.01 ng/mL (ref 0.00–0.08)

## 2018-05-25 LAB — COMPREHENSIVE METABOLIC PANEL
ALBUMIN: 3.6 g/dL (ref 3.5–5.0)
ALK PHOS: 63 U/L (ref 38–126)
ALT: 17 U/L (ref 0–44)
AST: 24 U/L (ref 15–41)
Anion gap: 8 (ref 5–15)
BILIRUBIN TOTAL: 1.1 mg/dL (ref 0.3–1.2)
BUN: 24 mg/dL — AB (ref 8–23)
CALCIUM: 8.6 mg/dL — AB (ref 8.9–10.3)
CO2: 25 mmol/L (ref 22–32)
CREATININE: 1.65 mg/dL — AB (ref 0.61–1.24)
Chloride: 110 mmol/L (ref 98–111)
GFR calc Af Amer: 44 mL/min — ABNORMAL LOW (ref >60)
GFR calc non Af Amer: 38 mL/min — ABNORMAL LOW (ref >60)
GLUCOSE: 104 mg/dL — AB (ref 70–99)
Potassium: 4.4 mmol/L (ref 3.5–5.1)
SODIUM: 143 mmol/L (ref 135–145)
TOTAL PROTEIN: 6.4 g/dL — AB (ref 6.5–8.1)

## 2018-05-25 LAB — URINALYSIS, ROUTINE W REFLEX MICROSCOPIC
Bilirubin Urine: NEGATIVE
GLUCOSE, UA: NEGATIVE mg/dL
HGB URINE DIPSTICK: NEGATIVE
Ketones, ur: NEGATIVE mg/dL
LEUKOCYTES UA: NEGATIVE
Nitrite: NEGATIVE
PH: 6 (ref 5.0–8.0)
PROTEIN: NEGATIVE mg/dL
Specific Gravity, Urine: 1.008 (ref 1.005–1.030)

## 2018-05-25 MED ORDER — SODIUM CHLORIDE 0.9 % IV BOLUS
500.0000 mL | Freq: Once | INTRAVENOUS | Status: AC
  Administered 2018-05-25: 500 mL via INTRAVENOUS
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
## 2018-05-25 NOTE — ED Notes (Addendum)
Orthostatics: Laying: 122/69 HR 65 Sitting: 125/83 HR 72 Standing 132/71 HR 79

## 2018-05-25 NOTE — ED Triage Notes (Addendum)
Pt was sitting at work, where there is no AC or fan, and became sweaty. Pt has not drank water today nor eaten breakfast. Pt is not sweating upon assessment and states he is comfortable.

## 2018-05-25 NOTE — ED Notes (Signed)
Bed: [id number] Expected date:  Expected time:  Means of arrival:  Comments: EMS/heat exposure

## 2018-05-25 NOTE — ED Notes (Signed)
Pt has urinal at bedside and is aware of need for urine specimen  

## 2018-05-25 NOTE — ED Notes (Signed)
Pt reminded of need for urine 

## 2018-05-25 NOTE — Discharge Instructions (Signed)
Please read and follow all provided instructions.  Your diagnoses today include:  1. Dizziness   2. Elevated serum creatinine     Tests performed today include:  An EKG of your heart  A chest x-ray  Cardiac enzymes - a blood test for heart muscle damage  Blood counts and electrolytes - kidney test is a little high, this will need to be rechecked by your doctor in 2 weeks  Vital signs. See below for your results today.   Medications prescribed:   None  Take any prescribed medications only as directed.  Follow-up instructions: Please follow-up with your primary care provider as soon as you can for further evaluation of your symptoms.   Call the heart doctor to schedule a follow-up appointment.   Return instructions:  SEEK IMMEDIATE MEDICAL ATTENTION IF:  You have severe chest pain, especially if the pain is crushing or pressure-like and spreads to the arms, back, neck, or jaw, or if you have sweating, nausea (feeling sick to your stomach), or shortness of breath. THIS IS AN EMERGENCY. Don't wait to see if the pain will go away. Get medical help at once. Call 911 or 0 (operator). DO NOT drive yourself to the hospital.   Your chest pain gets worse and does not go away with rest.   You have an attack of chest pain lasting longer than usual, despite rest and treatment with the medications your caregiver has prescribed.   You wake from sleep with chest pain or shortness of breath.  You feel dizzy or faint.  You have chest pain not typical of your usual pain for which you originally saw your caregiver.   You have any other emergent concerns regarding your health.  Your vital signs today were: BP 131/76    Pulse 63    Temp (!) 97.5 F (36.4 C) (Oral)    Resp 19    Wt 110.2 kg (243 lb)    SpO2 98%    BMI 33.89 kg/m  If your blood pressure (BP) was elevated above 135/85 this visit, please have this repeated by your doctor within one month. --------------

## 2018-06-06 ENCOUNTER — Ambulatory Visit
Admission: RE | Admit: 2018-06-06 | Discharge: 2018-06-06 | Disposition: A | Discharge status: Home / Self Care | Payer: Medicare Other | Source: Ambulatory Visit | Attending: Internal Medicine | Admitting: Internal Medicine

## 2018-06-06 ENCOUNTER — Other Ambulatory Visit: Payer: Self-pay | Admitting: Internal Medicine

## 2018-06-06 DIAGNOSIS — I6523 Occlusion and stenosis of bilateral carotid arteries: Secondary | ICD-10-CM | POA: Diagnosis not present

## 2018-06-06 DIAGNOSIS — E7800 Pure hypercholesterolemia, unspecified: Secondary | ICD-10-CM | POA: Diagnosis not present

## 2018-06-06 DIAGNOSIS — R61 Generalized hyperhidrosis: Secondary | ICD-10-CM | POA: Diagnosis not present

## 2018-06-06 DIAGNOSIS — R42 Dizziness and giddiness: Secondary | ICD-10-CM | POA: Diagnosis not present

## 2018-06-06 DIAGNOSIS — R0609 Other forms of dyspnea: Secondary | ICD-10-CM | POA: Diagnosis not present

## 2018-06-06 DIAGNOSIS — Z7984 Long term (current) use of oral hypoglycemic drugs: Secondary | ICD-10-CM | POA: Diagnosis not present

## 2018-06-06 DIAGNOSIS — E1169 Type 2 diabetes mellitus with other specified complication: Secondary | ICD-10-CM | POA: Diagnosis not present

## 2018-06-06 DIAGNOSIS — N183 Chronic kidney disease, stage 3 (moderate): Secondary | ICD-10-CM | POA: Diagnosis not present

## 2018-06-08 ENCOUNTER — Encounter: Payer: Self-pay | Admitting: Cardiology

## 2018-06-08 ENCOUNTER — Ambulatory Visit (INDEPENDENT_AMBULATORY_CARE_PROVIDER_SITE_OTHER): Payer: Medicare Other | Admitting: Cardiology

## 2018-06-08 VITALS — BP 128/60 | HR 62 | Ht 73.0 in | Wt 223.0 lb

## 2018-06-08 DIAGNOSIS — I10 Essential (primary) hypertension: Secondary | ICD-10-CM

## 2018-06-08 DIAGNOSIS — R072 Precordial pain: Secondary | ICD-10-CM

## 2018-06-08 DIAGNOSIS — E785 Hyperlipidemia, unspecified: Secondary | ICD-10-CM | POA: Diagnosis not present

## 2018-06-08 DIAGNOSIS — R0609 Other forms of dyspnea: Secondary | ICD-10-CM | POA: Diagnosis not present

## 2018-06-08 DIAGNOSIS — E1169 Type 2 diabetes mellitus with other specified complication: Secondary | ICD-10-CM | POA: Diagnosis not present

## 2018-06-08 DIAGNOSIS — R600 Localized edema: Secondary | ICD-10-CM | POA: Diagnosis not present

## 2018-06-08 DIAGNOSIS — E1122 Type 2 diabetes mellitus with diabetic chronic kidney disease: Secondary | ICD-10-CM | POA: Diagnosis not present

## 2018-06-08 DIAGNOSIS — I872 Venous insufficiency (chronic) (peripheral): Secondary | ICD-10-CM

## 2018-06-08 DIAGNOSIS — I4510 Unspecified right bundle-branch block: Secondary | ICD-10-CM | POA: Diagnosis not present

## 2018-06-08 NOTE — Patient Instructions (Addendum)
Medication Instructions: Dr Herbie BaltimoreHarding recommends that you continue on your current medications as directed. Please refer to the Current Medication list given to you today.  Labwork: Your physician recommends that you return for lab work prior to your CT.  Testing/Procedures: 1. Coronary CT - Your physician has requested that you have cardiac CT. Cardiac computed tomography (CT) is a painless test that uses an x-ray machine to take clear, detailed pictures of your heart. For further information please visit [email or web address]. Please follow instruction sheet as given. >>This [facility] be performed at [facility]. Location and instructions listed below.  2. Echocardiogram - Your physician has requested that you have an echocardiogram. Echocardiography is a painless test that uses sound waves to create images of your heart. It provides your doctor with information about the size and shape of your heart and how well your heart's chambers and valves are working. This procedure takes approximately one hour. There are no restrictions for this procedure. >>This [facility] performed at [facility]. [address], [address] [place] [address] [phone]  Follow-up: Dr Herbie BaltimoreHarding recommends that you schedule a follow-up appointment in 6 weeks.  If you need a refill on your cardiac medications before your next appointment, please call your pharmacy.    Please arrive [facility] the [facility] at ________ AM (30-45 minutes prior to test start time)  [facility] [address] [place], [place] [address] [phone]  Proceed to the [facility] Radiology Department (First Floor).  Please follow these instructions carefully (unless otherwise directed):  Hold all erectile dysfunction medications at least 48 hours prior to test.  On the Night Before the Test: . Drink plenty of water. . Do not consume any caffeinated/decaffeinated beverages or  chocolate 12 hours prior to your test. . Do not take any antihistamines 12 hours prior to your test. . If you take Metformin do not take 24 hours prior to test.  On the Day of the Test: . Drink plenty of water. Do not drink any water within one hour of the test. . Do not eat any food 4 hours prior to the test. . You may take your regular medications prior to the test. . HOLD Furosemide morning of the test.  After the Test: . Drink plenty of water. . After receiving IV contrast, you may experience a mild flushed feeling. This is normal. . On occasion, you may experience a mild rash up to 24 hours after the test. This is not dangerous. If this occurs, you can take Benadryl 25 mg and increase your fluid intake. . If you experience trouble breathing, this can be serious. If it is severe call 911 IMMEDIATELY. If it is mild, please call our office. . If you take any of these medications: Glipizide/Metformin, Avandament, Glucavance, please do not take 48 hours after completing test.

## 2018-06-08 NOTE — Progress Notes (Signed)
PCP: Renford Dills, MD  [facility] Note: Chief Complaint  Patient presents with  . New Patient (Initial Visit)    Dyspnea on exertion -rule out angina.     HPI: Steven Myers is a 83 y.o. male with a PMH below (notable for DM-2, HLD, HTN & obesity -- Metabolic Syndrome) who presents today for evaluation of EXERTIONAL DYSPNEA as a possible ANGINAL EQUIVALENT  He is being seen today  at the request of Renford Dills, MD. -- He was last seen by Dr. 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  He was last seen by Renford Dills, MD July 15 with for ER follow-up symptoms of exertional dyspnea and dizziness on.  Dr. Nehemiah Settle was concerned that his progressive exertional dyspnea was potentially an anginal equivalent based on his risk factors noted above.  He is referred for risk stratification.  There is also been concern of dizziness with negative orthostatics actually was seen in the emergency room on-July 3..  He was referred for carotid Dopplers and MRI of the brain.  Recent Hospitalizations:   Emergency room May 25, 2018: He went to work as usual and upon arrival he began feeling very dizzy with unsteady balance.  He was profusely sweaty and felt nauseated.  He did not actually pass out, but noted some difficulty swallowing with "lump in his throat" --> he also noticed some worsening shortness of breath with exertion and associated diaphoresis and throat discomfort.  Ruled out for MI with 2- troponins.  EKG was relatively benign.  He was encouraged to be admitted for observation to rule out and consider stress test, but he declined.    He did have elevated creatinine up to 1.65 -   Which is higher than usual for his creatinine about 1.4 (CKD-3).   Studies Personally Reviewed - (if available, images/films reviewed: From Epic Chart or Care  Everywhere)  Carotid Dopplers June 06, 2017: Minimal to moderate amount of bilateral carotid plaque. L>R but no significant stenosis in either internal carotid artery.  Lexiscan Myoview 04/2010: EF 62%. NO ISCHEMIA OR INFARCTION. LOW RISK. No RWMA   Interval History: Steven Myers is a very pleasant elderly gentleman with mesomorphic body habitus and truncal obesity as well as hypertension hyperlipidemia and diabetes mellitus type 2 with CKD 3.  He presents really with the main concern exertional dyspnea which this point is now he cannot walk more than 1 or 2 blocks.  This seems to be a slow progression over the last several months.  He used to be able to do his yard work, but now get quite short of breath if he is pushing his lawn more for a short distances.  His daughter seems to think this may be going on longer than he is acknowledging simply because he has cut back on what he does now to the point where he barely does any walking.  He is become quite sedentary.   He also has noted increasingly more frequent dizzy spells like the one that went to emergency room.  He does not feel like he has any rapid irregular heartbeats or palpitations,  nor does he note having change in balance or dizziness with standing up or sitting.  Dr. Nehemiah Settle did not feel like his symptoms sounded vertiginous in nature.  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  His orthostatic evaluation and Dr. Idelle Crouch office was negative.  He does have exertional dyspnea, but has not noted any chest tightness or pressure with exertion - but he has noted off an on episodes of a burning sensation across his chest -- not really sure if associated with exertion or not..  No real heart failure symptoms of PND or orthopnea but he does have venous stasis related , mild bilateral edema.  He has had some dizzy spells with diaphoresis and near syncope, but no true syncope.   No TIA/amaurosis fugax  symptoms. No claudication.  ROS: A comprehensive was performed. Review of Systems  Constitutional: Positive for diaphoresis (When he went to the emergency room). Negative for chills, fever, malaise/fatigue (Daughter seems to think that he is not doing as much as he used to do.) and weight loss.  HENT: Negative for congestion and nosebleeds.   Respiratory: Positive for shortness of breath (Per HPI).   Cardiovascular: Positive for leg swelling ( per HPI).  Gastrointestinal: Negative for blood in stool, constipation and melena.  Genitourinary: Negative for dysuria and hematuria.  Musculoskeletal: Positive for joint pain (Baseline arthritis pains.). Negative for back pain, falls and myalgias.  Neurological: Positive for dizziness (Does not feel a sense of the world spinning, just feels lightheaded and weak.) and tingling. Negative for focal weakness, seizures, loss of consciousness, weakness and headaches.  Psychiatric/Behavioral: Negative for depression and memory loss. The patient is not nervous/anxious and does not have insomnia.   All other systems reviewed and are negative.   I have reviewed and (if needed) personally updated the patient's problem list, medications, allergies, past medical and surgical history, social and family history.   Past Medical History:  Diagnosis Date  . Bilateral cataracts   . CKD (chronic kidney disease) stage 3, GFR 30-59 ml/min (HCC)   . DM (diabetes mellitus) type II controlled with renal manifestation (HCC)    Not on insulin  . Essential hypertension   . Glaucoma    Optho: Dr. Cathey Endow  . History of colonoscopy   . Hyperlipidemia associated with type 2 diabetes mellitus (HCC)    Intolerant to statins --> because of elevated LFTs.  . Obesity due to excess calories without serious comorbidity    BMI ~32 - mostly truncal obesity (mesomorphic)  . Osteoarthritis of hip    Right > Left -- limits walking  . RBBB (right bundle branch block)   . Venous stasis  dermatitis of both lower extremities - with edema    L leg worse than R 2/2 prior injury.; controlled somewhat with support hose    Past Surgical History:  Procedure Laterality Date  . NM MYOVIEW LTD  04/2010   LEXISCAN: EF 62%. NO ISCHEMIA OR INFARCTION. LOW RISK. No RWMA     Current Meds  Medication Sig  . amLODipine (NORVASC) 5 MG tablet Take 5 mg by mouth daily.  Marland Kitchen aspirin 81 MG tablet Take 81 mg by mouth daily.    . brimonidine (ALPHAGAN) 0.15 % ophthalmic solution Place 1 drop into both eyes daily.  . dorzolamide-timolol (COSOPT) 22.3-6.8 MG/ML ophthalmic solution PLACE 1 DROP INTO BOTH EYES TWICE A DAY  . ezetimibe (ZETIA) 10 MG tablet Take 10 mg by mouth daily.  . furosemide (LASIX) 20 MG tablet take 1 tablet by mouth twice a day  . glimepiride (AMARYL) 4 MG tablet Take 4 mg by mouth daily before breakfast.  . latanoprost (XALATAN) 0.005 % ophthalmic solution Place 1  drop into both eyes at bedtime.  Marland Kitchen LUMIGAN 0.01 % SOLN Place 1 drop into both eyes daily.  . metFORMIN (GLUCOPHAGE) 1000 MG tablet Take 1,000 mg by mouth 2 (two) times daily.  . metoprolol succinate (TOPROL-XL) 100 MG 24 hr tablet take 1 tablet by mouth once daily  . trandolapril (MAVIK) 4 MG tablet take 1 tablet by mouth twice a day NEEDS OFFICE VISIT/LABS FOR REFILLS    No Known Allergies  Social History   Tobacco Use  . Smoking status: Former Smoker    Types: Cigarettes    Last attempt to quit: 1980    Years since quitting: 39.5  . Smokeless tobacco: Never Used  . Tobacco comment: quit 40 years ago  Substance Use Topics  . Alcohol use: Not Currently    Comment: quit over 20 yr ago  . Drug use: No   Social History   Social History Narrative   He is married to his wife -[name] (Ragan) Morenci.   Accompanied by his daughter.   Still works as a Paediatric nurse    Slow decline in activity level - no routine exercise    family history includes Diabetes in his mother; Heart disease (age of onset: 32) in his  father.  Wt Readings from Last 3 Encounters:  06/08/18 223 lb (101.2 kg)  05/25/18 243 lb (110.2 kg)  12/07/11 243 lb (110.2 kg)    PHYSICAL EXAM BP 128/60   Pulse 62   Ht 6\' 1"  (1.854 m)   Wt 223 lb (101.2 kg)   BMI 29.42 kg/m  Physical Exam  Constitutional: He is oriented to person, place, and time. He appears well-developed and well-nourished. No distress.  Truncal obesity with protuberant abdomen.  HENT:  Head: Normocephalic and atraumatic.  Mouth/Throat: No oropharyngeal exudate.  Eyes: Conjunctivae and EOM are normal. No scleral icterus.  Neck: Normal range of motion. Neck supple. No hepatojugular reflux and no JVD present. Carotid bruit is not present. No thyromegaly present.  Cardiovascular: Normal rate, regular rhythm and intact distal pulses.  No extrasystoles are present. PMI is not displaced (Unable to palpate). Exam reveals distant heart sounds. Exam reveals no gallop and no friction rub.  No murmur heard. Pulmonary/Chest: Effort normal and breath sounds normal. No respiratory distress. He has no wheezes. He has no rales. He exhibits no tenderness.  Abdominal: Soft. Bowel sounds are normal. He exhibits no distension. There is no tenderness. There is no rebound.  Protuberant obese abdomen.  Unable to assess HSM.  Musculoskeletal: Normal range of motion. He exhibits edema.  Lymphadenopathy:    He has no cervical adenopathy.  Neurological: He is alert and oriented to person, place, and time. No cranial nerve deficit.  Hard of hearing  Skin:  Bilateral (L>R)venous stasis changes - L ~1-2+, R tr-1+.   Psychiatric: He has a normal mood and affect. His behavior is normal. Judgment and thought content normal.    Adult ECG Report  Rate: 62 ;  Rhythm: normal sinus rhythm, sinus arrhythmia and RBBB.  Lateral T wave inversions.  Cannot exclude inferior MI, age undetermined.;   Narrative Interpretation: Abnormal EKG but no acute findings.   Other studies  Reviewed: Additional studies/ records that were reviewed today include:  Recent Labs: April 25, 2018  TC 167, TG 211, HDL 45, LDL 80.  Hemoglobin 14.8,  Cr 1.46.  K+ 4.4.  TSH 1.6.  A1c 6.0   Lab Results  Component Value Date   CREATININE 1.65 (H) 05/25/2018  BUN 24 (H) 05/25/2018   NA 143 05/25/2018   K 4.4 05/25/2018   CL 110 05/25/2018   CO2 25 05/25/2018   CBC Latest Ref Rng & Units 05/25/2018 01/26/2012  WBC 4.0 - 10.5 K/uL 6.1 14.9(H)  Hemoglobin 13.0 - 17.0 g/dL 72.5 36.6  Hematocrit 44.0 - 52.0 % 45.6 45.3  Platelets 150 - 400 K/uL 185 162    ASSESSMENT / PLAN: Problem List Items Addressed This Visit    Venous stasis dermatitis of both lower extremities - with edema (Chronic)    Pretty well controlled with support stockings.  Related to being on his feet many years.  Left leg is worse because of an injury.  Not currently an issue.      RBBB (Chronic)    Chronic.  Dates back to 2008.      Precordial chest pain    Negative Myoview in 2011.    Now with exertional dyspnea again and some unusual sounding chest discomfort. -- With CRFs - at leas Moderate Risk for Cardiac etiology Plan: CT coronary angiogram      Relevant Orders   EKG 12-Lead (Completed)   CT CORONARY MORPH W/CTA COR W/SCORE W/CA W/CM &/OR WO/CM   CT CORONARY FRACTIONAL FLOW RESERVE DATA PREP   CT CORONARY FRACTIONAL FLOW RESERVE FLUID ANALYSIS   Hyperlipidemia associated with type 2 diabetes mellitus (HCC) (Chronic)    Statin intolerant.  He is not currently on any medication whatsoever and his LDL is 80.  Provided his coronary calcium score and coronary CT is not concerning I think I would be okay with that.      Essential hypertension (Chronic)    Blood pressure is well controlled today on metoprolol, amlodipine and trandolapril (Mavik). Will probably not want to be too aggressive with blood pressure control given his dizziness.        Edema of both lower extremities (Chronic)    Pretty much  controlled with Lasix and support stockings      Relevant Orders   EKG 12-Lead (Completed)   Basic metabolic panel   CT CORONARY MORPH W/CTA COR W/SCORE W/CA W/CM &/OR WO/CM   CT CORONARY FRACTIONAL FLOW RESERVE DATA PREP   CT CORONARY FRACTIONAL FLOW RESERVE FLUID ANALYSIS   ECHOCARDIOGRAM COMPLETE   DOE (dyspnea on exertion) - Primary (Chronic)    Progressively worsening exertional dyspnea.  He is somewhat sedentary and I do not know how this truly is.   CRFs - listed in problem list ~= Metabolic Syndrome --> puts him @ at least Moderate Risk.  He had a negative Myoview 7 years ago, but I am worried with his truncal obesity that we may very well get potential false positive readings with gut attenuation.   I would like to see his exercise tolerance, however I think more prudent evaluation will be with a CT coronary angiogram to establish both anatomic and physiologic evaluation of his coronary arteries.  We will begin with a coronary calcium score followed by CT angiogram.  Will also assess cardiac & valvular function / filling pressures with 2 D Echo.  I will need to be as chemistry panel to ensure that his renal function is stabilized given his CKD-3.      Relevant Orders   EKG 12-Lead (Completed)   Basic metabolic panel   CT CORONARY MORPH W/CTA COR W/SCORE W/CA W/CM &/OR WO/CM   CT CORONARY FRACTIONAL FLOW RESERVE DATA PREP   CT CORONARY FRACTIONAL FLOW RESERVE FLUID ANALYSIS  ECHOCARDIOGRAM COMPLETE   DM (diabetes mellitus) type II controlled with renal manifestation (HCC) (Chronic)    Not currently on insulin.  On metformin and sulfonylurea.  A1c looks pretty well controlled.         Current medicines are reviewed at length with the patient today.  (+/- concerns) nA The following changes have been made:  n/a  Patient Instructions  Medication Instructions: Dr Herbie BaltimoreHarding recommends that you continue on your current medications as directed. Please refer to the Current  Medication list given to you today.  Labwork: Your physician recommends that you return for lab work prior to your CT.  Testing/Procedures: 1. Coronary CT - Your physician has requested that you have cardiac CT >>[facility] Location and instructions listed below.  2. Echocardiogram - Your physician has requested that you have an echocardiogram.  >>This will be performed at [facility] location.   Follow-up: Dr Herbie BaltimoreHarding recommends that you schedule a follow-up appointment in 6 weeks.   Studies Ordered:   Orders Placed This Encounter  Procedures  . CT CORONARY MORPH W/CTA COR W/SCORE W/CA W/CM &/OR WO/CM  . CT CORONARY FRACTIONAL FLOW RESERVE DATA PREP  . CT CORONARY FRACTIONAL FLOW RESERVE FLUID ANALYSIS  . Basic metabolic panel  . EKG 12-Lead  . ECHOCARDIOGRAM COMPLETE      Bryan Lemmaavid Harding, M.D., M.S. Interventional Cardiologist   Pager # [phone] Phone # [phone] [address] [place], [place] [address]   Thank you for choosing [facility] at [facility]!!

## 2018-06-10 ENCOUNTER — Encounter: Payer: Self-pay | Admitting: Cardiology

## 2018-06-10 ENCOUNTER — Ambulatory Visit
Admission: RE | Admit: 2018-06-10 | Discharge: 2018-06-10 | Disposition: A | Discharge status: Home / Self Care | Payer: Medicare Other | Source: Ambulatory Visit | Attending: Internal Medicine | Admitting: Internal Medicine

## 2018-06-10 DIAGNOSIS — E1169 Type 2 diabetes mellitus with other specified complication: Secondary | ICD-10-CM | POA: Insufficient documentation

## 2018-06-10 DIAGNOSIS — E785 Hyperlipidemia, unspecified: Secondary | ICD-10-CM

## 2018-06-10 DIAGNOSIS — E1129 Type 2 diabetes mellitus with other diabetic kidney complication: Secondary | ICD-10-CM | POA: Insufficient documentation

## 2018-06-10 DIAGNOSIS — R42 Dizziness and giddiness: Secondary | ICD-10-CM | POA: Diagnosis not present

## 2018-06-10 DIAGNOSIS — I872 Venous insufficiency (chronic) (peripheral): Secondary | ICD-10-CM | POA: Insufficient documentation

## 2018-06-10 DIAGNOSIS — R410 Disorientation, unspecified: Secondary | ICD-10-CM | POA: Diagnosis not present

## 2018-06-10 DIAGNOSIS — I10 Essential (primary) hypertension: Secondary | ICD-10-CM | POA: Insufficient documentation

## 2018-06-10 NOTE — Assessment & Plan Note (Signed)
Pretty well controlled with support stockings.  Related to being on his feet many years.  Left leg is worse because of an injury.  Not currently an issue.

## 2018-06-10 NOTE — Assessment & Plan Note (Addendum)
Negative Myoview in 2011.    Now with exertional dyspnea again and some unusual sounding chest discomfort. -- With CRFs - at leas Moderate Risk for Cardiac etiology Plan: CT coronary angiogram

## 2018-06-10 NOTE — Assessment & Plan Note (Signed)
Not currently on insulin.  On metformin and sulfonylurea.  A1c looks pretty well controlled.

## 2018-06-10 NOTE — Assessment & Plan Note (Signed)
Chronic.  Dates back to 2008.

## 2018-06-10 NOTE — Assessment & Plan Note (Signed)
Pretty much controlled with Lasix and support stockings

## 2018-06-10 NOTE — Assessment & Plan Note (Signed)
Blood pressure is well controlled today on metoprolol, amlodipine and trandolapril (Mavik). Will probably not want to be too aggressive with blood pressure control given his dizziness.

## 2018-06-10 NOTE — Assessment & Plan Note (Signed)
Statin intolerant.  He is not currently on any medication whatsoever and his LDL is 80.  Provided his coronary calcium score and coronary CT is not concerning I think I would be okay with that.

## 2018-06-10 NOTE — Assessment & Plan Note (Addendum)
Progressively worsening exertional dyspnea.  He is somewhat sedentary and I do not know how this truly is.   CRFs - listed in problem list ~= Metabolic Syndrome --> puts him @ at least Moderate Risk.  He had a negative Myoview 7 years ago, but I am worried with his truncal obesity that we may very well get potential false positive readings with gut attenuation.   I would like to see his exercise tolerance, however I think more prudent evaluation will be with a CT coronary angiogram to establish both anatomic and physiologic evaluation of his coronary arteries.  We will begin with a coronary calcium score followed by CT angiogram.  Will also assess cardiac & valvular function / filling pressures with 2 D Echo.  I will need to be as chemistry panel to ensure that his renal function is stabilized given his CKD-3.

## 2018-06-20 ENCOUNTER — Ambulatory Visit (HOSPITAL_COMMUNITY): Payer: Medicare Other | Attending: Internal Medicine

## 2018-06-20 ENCOUNTER — Other Ambulatory Visit: Payer: Self-pay

## 2018-06-20 DIAGNOSIS — I509 Heart failure, unspecified: Secondary | ICD-10-CM | POA: Insufficient documentation

## 2018-06-20 DIAGNOSIS — R600 Localized edema: Secondary | ICD-10-CM | POA: Diagnosis not present

## 2018-06-20 DIAGNOSIS — R0609 Other forms of dyspnea: Secondary | ICD-10-CM | POA: Insufficient documentation

## 2018-06-20 DIAGNOSIS — E119 Type 2 diabetes mellitus without complications: Secondary | ICD-10-CM | POA: Diagnosis not present

## 2018-06-20 DIAGNOSIS — I081 Rheumatic disorders of both mitral and tricuspid valves: Secondary | ICD-10-CM | POA: Diagnosis not present

## 2018-06-20 DIAGNOSIS — I110 Hypertensive heart disease with heart failure: Secondary | ICD-10-CM | POA: Insufficient documentation

## 2018-06-20 DIAGNOSIS — I4510 Unspecified right bundle-branch block: Secondary | ICD-10-CM | POA: Diagnosis not present

## 2018-06-20 DIAGNOSIS — Z87891 Personal history of nicotine dependence: Secondary | ICD-10-CM | POA: Diagnosis not present

## 2018-07-11 ENCOUNTER — Ambulatory Visit: Payer: Self-pay | Admitting: Cardiovascular Disease

## 2018-07-11 ENCOUNTER — Encounter

## 2018-07-18 DIAGNOSIS — H401132 Primary open-angle glaucoma, bilateral, moderate stage: Secondary | ICD-10-CM | POA: Diagnosis not present

## 2018-08-22 DIAGNOSIS — Z23 Encounter for immunization: Secondary | ICD-10-CM | POA: Diagnosis not present

## 2018-11-21 DIAGNOSIS — H401131 Primary open-angle glaucoma, bilateral, mild stage: Secondary | ICD-10-CM | POA: Diagnosis not present

## 2018-12-26 DIAGNOSIS — Z1389 Encounter for screening for other disorder: Secondary | ICD-10-CM | POA: Diagnosis not present

## 2018-12-26 DIAGNOSIS — N183 Chronic kidney disease, stage 3 (moderate): Secondary | ICD-10-CM | POA: Diagnosis not present

## 2018-12-26 DIAGNOSIS — R600 Localized edema: Secondary | ICD-10-CM | POA: Diagnosis not present

## 2018-12-26 DIAGNOSIS — E7800 Pure hypercholesterolemia, unspecified: Secondary | ICD-10-CM | POA: Diagnosis not present

## 2018-12-26 DIAGNOSIS — E1122 Type 2 diabetes mellitus with diabetic chronic kidney disease: Secondary | ICD-10-CM | POA: Diagnosis not present

## 2018-12-26 DIAGNOSIS — Z0000 Encounter for general adult medical examination without abnormal findings: Secondary | ICD-10-CM | POA: Diagnosis not present

## 2018-12-26 DIAGNOSIS — I10 Essential (primary) hypertension: Secondary | ICD-10-CM | POA: Diagnosis not present

## 2019-03-27 DIAGNOSIS — H401132 Primary open-angle glaucoma, bilateral, moderate stage: Secondary | ICD-10-CM | POA: Diagnosis not present

## 2019-03-28 ENCOUNTER — Emergency Department (HOSPITAL_COMMUNITY): Payer: Medicare Other

## 2019-03-28 ENCOUNTER — Other Ambulatory Visit: Payer: Self-pay

## 2019-03-28 ENCOUNTER — Emergency Department (HOSPITAL_COMMUNITY)
Admission: EM | Admit: 2019-03-28 | Discharge: 2019-03-28 | Disposition: A | Discharge status: Home / Self Care | Payer: Medicare Other | Attending: Emergency Medicine | Admitting: Emergency Medicine

## 2019-03-28 DIAGNOSIS — Z23 Encounter for immunization: Secondary | ICD-10-CM | POA: Diagnosis not present

## 2019-03-28 DIAGNOSIS — S0181XA Laceration without foreign body of other part of head, initial encounter: Secondary | ICD-10-CM | POA: Insufficient documentation

## 2019-03-28 DIAGNOSIS — I10 Essential (primary) hypertension: Secondary | ICD-10-CM | POA: Diagnosis not present

## 2019-03-28 DIAGNOSIS — S0083XA Contusion of other part of head, initial encounter: Secondary | ICD-10-CM | POA: Diagnosis not present

## 2019-03-28 DIAGNOSIS — Y999 Unspecified external cause status: Secondary | ICD-10-CM | POA: Diagnosis not present

## 2019-03-28 DIAGNOSIS — Z79899 Other long term (current) drug therapy: Secondary | ICD-10-CM | POA: Insufficient documentation

## 2019-03-28 DIAGNOSIS — Z7984 Long term (current) use of oral hypoglycemic drugs: Secondary | ICD-10-CM | POA: Insufficient documentation

## 2019-03-28 DIAGNOSIS — Y92013 Bedroom of single-family (private) house as the place of occurrence of the external cause: Secondary | ICD-10-CM | POA: Diagnosis not present

## 2019-03-28 DIAGNOSIS — Z7982 Long term (current) use of aspirin: Secondary | ICD-10-CM | POA: Diagnosis not present

## 2019-03-28 DIAGNOSIS — W06XXXA Fall from bed, initial encounter: Secondary | ICD-10-CM | POA: Insufficient documentation

## 2019-03-28 DIAGNOSIS — E1122 Type 2 diabetes mellitus with diabetic chronic kidney disease: Secondary | ICD-10-CM | POA: Insufficient documentation

## 2019-03-28 DIAGNOSIS — Z87891 Personal history of nicotine dependence: Secondary | ICD-10-CM | POA: Insufficient documentation

## 2019-03-28 DIAGNOSIS — R51 Headache: Secondary | ICD-10-CM | POA: Insufficient documentation

## 2019-03-28 DIAGNOSIS — S0990XA Unspecified injury of head, initial encounter: Secondary | ICD-10-CM | POA: Diagnosis not present

## 2019-03-28 DIAGNOSIS — N183 Chronic kidney disease, stage 3 (moderate): Secondary | ICD-10-CM | POA: Diagnosis not present

## 2019-03-28 DIAGNOSIS — I129 Hypertensive chronic kidney disease with stage 1 through stage 4 chronic kidney disease, or unspecified chronic kidney disease: Secondary | ICD-10-CM | POA: Insufficient documentation

## 2019-03-28 DIAGNOSIS — Y9384 Activity, sleeping: Secondary | ICD-10-CM | POA: Insufficient documentation

## 2019-03-28 DIAGNOSIS — W19XXXA Unspecified fall, initial encounter: Secondary | ICD-10-CM

## 2019-03-28 LAB — CBC WITH DIFFERENTIAL/PLATELET
Abs Immature Granulocytes: 0.02 10*3/uL (ref 0.00–0.07)
Basophils Absolute: 0 10*3/uL (ref 0.0–0.1)
Basophils Relative: 1 %
Eosinophils Absolute: 0.1 10*3/uL (ref 0.0–0.5)
Eosinophils Relative: 2 %
HCT: 47.4 % (ref 39.0–52.0)
Hemoglobin: 14.9 g/dL (ref 13.0–17.0)
Immature Granulocytes: 0 %
Lymphocytes Relative: 16 %
Lymphs Abs: 0.9 10*3/uL (ref 0.7–4.0)
MCH: 27.5 pg (ref 26.0–34.0)
MCHC: 31.4 g/dL (ref 30.0–36.0)
MCV: 87.5 fL (ref 80.0–100.0)
Monocytes Absolute: 0.6 10*3/uL (ref 0.1–1.0)
Monocytes Relative: 10 %
Neutro Abs: 4.1 10*3/uL (ref 1.7–7.7)
Neutrophils Relative %: 71 %
Platelets: 194 10*3/uL (ref 150–400)
RBC: 5.42 MIL/uL (ref 4.22–5.81)
RDW: 13.9 % (ref 11.5–15.5)
WBC: 5.7 10*3/uL (ref 4.0–10.5)
nRBC: 0 % (ref 0.0–0.2)

## 2019-03-28 LAB — BASIC METABOLIC PANEL
Anion gap: 11 (ref 5–15)
BUN: 19 mg/dL (ref 8–23)
CO2: 24 mmol/L (ref 22–32)
Calcium: 9.2 mg/dL (ref 8.9–10.3)
Chloride: 107 mmol/L (ref 98–111)
Creatinine, Ser: 1.75 mg/dL — ABNORMAL HIGH (ref 0.61–1.24)
GFR calc Af Amer: 41 mL/min — ABNORMAL LOW (ref >60)
GFR calc non Af Amer: 36 mL/min — ABNORMAL LOW (ref >60)
Glucose, Bld: 93 mg/dL (ref 70–99)
Potassium: 4 mmol/L (ref 3.5–5.1)
Sodium: 142 mmol/L (ref 135–145)

## 2019-03-28 LAB — RAPID HIV SCREEN (HIV 1/2 AB+AG)
HIV 1/2 Antibodies: NONREACTIVE
HIV-1 P24 Antigen - HIV24: NONREACTIVE

## 2019-03-28 MED ORDER — ACETAMINOPHEN 500 MG PO TABS
500.0000 mg | ORAL_TABLET | Freq: Once | ORAL | Status: AC
  Administered 2019-03-28: 09:00:00 500 mg via ORAL
  Filled 2019-03-28: qty 1

## 2019-03-28 MED ORDER — TETANUS-DIPHTH-ACELL PERTUSSIS 5-2.5-18.5 LF-MCG/0.5 IM SUSP
0.5000 mL | Freq: Once | INTRAMUSCULAR | Status: AC
  Administered 2019-03-28: 07:00:00 0.5 mL via INTRAMUSCULAR
  Filled 2019-03-28: qty 0.5

## 2019-03-28 MED ORDER — LIDOCAINE-EPINEPHRINE (PF) 2 %-1:200000 IJ SOLN
INTRAMUSCULAR | Status: AC
  Administered 2019-03-28: 06:00:00
  Filled 2019-03-28: qty 20

## 2019-03-28 NOTE — Discharge Instructions (Addendum)
Followup for suture removal in 1 week. Return to the ED with worsening headache, vomiting, unilateral weakness, behavior change or any other concerns.   Please also follow-up with your PCP to continue monitoring your kidney function as it slightly worsened from last time.

## 2019-03-28 NOTE — ED Notes (Signed)
Pt ambulated well with this RN. Pt steady on his feet. Denied any dizziness or shortness of breath with ambulation.

## 2019-03-28 NOTE — ED Triage Notes (Signed)
Per ems pt was at home and said he fell out of bed and hit his head on the floor or the window seal. NO LOC NO neck or back pain. Pt is alert oriented x 4. Pt has a laceration on the right side of his head.

## 2019-03-28 NOTE — ED Provider Notes (Signed)
[facility] Provider Note   CSN: [id number] Arrival date & time: 03/28/19  0545    History   Chief Complaint Chief Complaint  Patient presents with  . Fall    HPI Steven BeckmannWillie Myers is a 84 y.o. male.      Patient brought in by EMS with head injury and laceration to forehead.  He apparently rolled out of bed around 3:15 and hit his head on unknown surface possibly the floor or window sill.  He has had a bleeding laceration since that is been difficult to control.  Denies losing consciousness.  Denies any other injury.  No neck or back pain.  No focal weakness, numbness or tingling.  No chest or abdominal pain.  He denies any blood thinner use.  Unknown last tetanus shot.  Denies any visual changes.  The history is provided by the patient and the EMS personnel.  Fall  Associated symptoms include headaches. Pertinent negatives include no chest pain, no abdominal pain and no shortness of breath.    Past Medical History:  Diagnosis Date  . Bilateral cataracts   . CKD (chronic kidney disease) stage 3, GFR 30-59 ml/min (HCC)   . DM (diabetes mellitus) type II controlled with renal manifestation (HCC)    Not on insulin  . Essential hypertension   . Glaucoma    Optho: Dr. Cathey EndowBowen  . History of colonoscopy   . Hyperlipidemia associated with type 2 diabetes mellitus (HCC)    Intolerant to statins --> because of elevated LFTs.  . Obesity due to excess calories without serious comorbidity    BMI ~32 - mostly truncal obesity (mesomorphic)  . Osteoarthritis of hip    Right > Left -- limits walking  . RBBB (right bundle branch block)   . Venous stasis dermatitis of both lower extremities - with edema    L leg worse than R 2/2 prior injury.; controlled somewhat with support hose    Patient Active Problem List   Diagnosis Date Noted  . Essential hypertension   . DM (diabetes mellitus) type II controlled with renal manifestation (HCC)   .  Hyperlipidemia associated with type 2 diabetes mellitus (HCC)   . Venous stasis dermatitis of both lower extremities - with edema   . DOE (dyspnea on exertion) 06/08/2018  . Edema of both lower extremities 06/08/2018  . RBBB 05/05/2010  . Precordial chest pain 05/05/2010  . GASTRITIS 10/14/2009  . FECAL OCCULT BLOOD 09/09/2009    Past Surgical History:  Procedure Laterality Date  . NM MYOVIEW LTD  04/2010   LEXISCAN: EF 62%. NO ISCHEMIA OR INFARCTION. LOW RISK. No RWMA         Home Medications    Prior to Admission medications   Medication Sig Start Date End Date Taking? Authorizing Provider  amLODipine (NORVASC) 5 MG tablet Take 5 mg by mouth daily. 04/03/18   [provider]  aspirin 81 MG tablet Take 81 mg by mouth daily.      [provider]  brimonidine (ALPHAGAN) 0.15 % ophthalmic solution Place 1 drop into both eyes daily. 05/23/18   [provider]  dorzolamide-timolol (COSOPT) 22.3-6.8 MG/ML ophthalmic solution PLACE 1 DROP INTO BOTH EYES TWICE A DAY 05/27/18   [provider]  ezetimibe (ZETIA) 10 MG tablet Take 10 mg by mouth daily. 03/21/18   [provider]  furosemide (LASIX) 20 MG tablet take 1 tablet by mouth twice a day 07/08/12   Wendall StadeNishan, Peter C, MD  glimepiride (AMARYL) 4 MG tablet Take 4 mg by mouth daily before breakfast.    [provider]  latanoprost (XALATAN) 0.005 % ophthalmic solution Place 1 drop into both eyes at bedtime.    [provider]  LUMIGAN 0.01 % SOLN Place 1 drop into both eyes daily. 04/13/18   [provider]  metFORMIN (GLUCOPHAGE) 1000 MG tablet Take 1,000 mg by mouth 2 (two) times daily. 05/02/18   [provider]  metoprolol succinate (TOPROL-XL) 100 MG 24 hr tablet take 1 tablet by mouth once daily 05/28/12   Georgian Co M, PA-C  trandolapril (MAVIK) 4 MG tablet take 1 tablet by mouth twice a day NEEDS OFFICE VISIT/LABS FOR REFILLS 07/13/12   Sondra Barges, PA-C     Family History Family History  Problem Relation Age of Onset  . Diabetes Mother   . Heart disease Father 86  . Colon cancer Neg Hx     Social History Social History   Tobacco Use  . Smoking status: Former Smoker    Types: Cigarettes    Last attempt to quit: 1980    Years since quitting: 40.3  . Smokeless tobacco: Never Used  . Tobacco comment: quit 40 years ago  Substance Use Topics  . Alcohol use: Not Currently    Comment: quit over 20 yr ago  . Drug use: No     Allergies   Patient has no known allergies.   Review of Systems Review of Systems  Constitutional: Negative for activity change, appetite change and fever.  HENT: Negative for congestion and rhinorrhea.   Eyes: Negative for visual disturbance.  Respiratory: Negative for cough, chest tightness and shortness of breath.   Cardiovascular: Negative for chest pain.  Gastrointestinal: Negative for abdominal pain, diarrhea, nausea and vomiting.  Genitourinary: Negative for dysuria and hematuria.  Musculoskeletal: Negative for back pain and neck pain.  Skin: Positive for wound.  Neurological: Positive for headaches. Negative for dizziness, weakness and numbness.   all other systems are negative except as noted in the HPI and PMH.     Physical Exam Updated Vital Signs BP (!) 162/106 (BP Location: Right Arm)   Pulse 78   Temp 98.3 F (36.8 C) (Oral)   Resp 16   SpO2 97%   Physical Exam Vitals signs and nursing note reviewed.  Constitutional:      General: He is not in acute distress.    Appearance: He is well-developed.  HENT:     Head: Normocephalic and atraumatic.      Comments: No septal hematoma or hemotympanum. Symmetric eye brow raise.     Mouth/Throat:     Pharynx: No oropharyngeal exudate.  Eyes:     Extraocular Movements: Extraocular movements intact.     Conjunctiva/sclera: Conjunctivae normal.     Pupils: Pupils are equal, round, and reactive to light.  Neck:     Musculoskeletal:  Normal range of motion and neck supple.     Comments: No C-spine tenderness Cardiovascular:     Rate and Rhythm: Normal rate and regular rhythm.     Heart sounds: Normal heart sounds. No murmur.  Pulmonary:     Effort: Pulmonary effort is normal. No respiratory distress.     Breath sounds: Normal breath sounds.  Abdominal:     Palpations: Abdomen is soft.     Tenderness: There is no abdominal tenderness. There is no guarding or rebound.  Musculoskeletal: Normal range of motion.        General:  No tenderness.  Skin:    General: Skin is warm.  Neurological:     Mental Status: He is alert and oriented to person, place, and time.     Cranial Nerves: No cranial nerve deficit.     Motor: No abnormal muscle tone.     Coordination: Coordination normal.     Comments: No ataxia on finger to nose bilaterally. No pronator drift. 5/5 strength throughout. CN 2-12 intact.Equal grip strength. Sensation intact.   Psychiatric:        Behavior: Behavior normal.      ED Treatments / Results  Labs (all labs ordered are listed, but only abnormal results are displayed) Labs Reviewed  BASIC METABOLIC PANEL - Abnormal; Notable for the following components:      Result Value   Creatinine, Ser 1.75 (*)    GFR calc non Af Amer 36 (*)    GFR calc Af Amer 41 (*)    All other components within normal limits  CBC WITH DIFFERENTIAL/PLATELET  RAPID HIV SCREEN (HIV 1/2 AB+AG)    EKG None  Radiology No results found.  Procedures .Marland KitchenLaceration Repair Date/Time: 03/28/2019 6:31 AM Performed by: Glynn Octave, MD Authorized by: Glynn Octave, MD   Consent:    Consent obtained:  Verbal   Consent given by:  Patient   Risks discussed:  Infection, need for additional repair, nerve damage, poor wound healing, poor cosmetic result, pain, retained foreign body, tendon damage and vascular damage   Alternatives discussed:  No treatment Anesthesia (see MAR for exact dosages):    Anesthesia method:   Local infiltration   Local anesthetic:  Lidocaine 1% WITH epi Laceration details:    Location:  Face   Face location:  R eyebrow   Length (cm):  3 Repair type:    Repair type:  Complex Pre-procedure details:    Preparation:  Patient was prepped and draped in usual sterile fashion and imaging obtained to evaluate for foreign bodies Exploration:    Limited defect created (wound extended): no     Hemostasis achieved with:  Epinephrine, direct pressure and tied off vessels   Wound exploration: wound explored through full range of motion and entire depth of wound probed and visualized     Wound extent: vascular damage     Wound extent: no foreign bodies/material noted and no underlying fracture noted     Contaminated: no   Treatment:    Area cleansed with:  Betadine   Amount of cleaning:  Standard   Irrigation solution:  Sterile saline   Irrigation method:  Pressure wash   Visualized foreign bodies/material removed: no     Debridement:  None   Undermining:  None   Scar revision: no   Subcutaneous repair:    Suture size:  4-0   Wound subcutaneous closure material used: vicryl.   Suture technique:  Figure eight   Number of sutures:  4 Skin repair:    Repair method:  Sutures   Suture size:  5-0   Suture material:  Nylon   Suture technique:  Simple interrupted   Number of sutures:  7 Approximation:    Approximation:  Close Post-procedure details:    Dressing:  Antibiotic ointment and bulky dressing   Patient tolerance of procedure:  Tolerated well, no immediate complications   (including critical care time)  Medications Ordered in ED Medications  Tdap (BOOSTRIX) injection 0.5 mL (has no administration in time range)  lidocaine-EPINEPHrine (XYLOCAINE W/EPI) 2 %-1:200000 (PF) injection (  Given  03/28/19 0615)     Initial Impression / Assessment and Plan / ED Course  I have reviewed the triage vital signs and the nursing notes.  Pertinent labs & imaging results that were  available during my care of the patient were reviewed by me and considered in my medical decision making (see chart for details).       Fall from bed with forehead laceration and head injury.  Actively bleeding on arrival it is nonpulsatile. Bleeding controlled with subcutaneous sutures and laceration cleaned and closed.  Tetanus to be updated.  CT imaging will be obtained. Labs show stable hemoglobin and creatinine.   CT pending at shift change. Patient will be given PO challenge and ambulated. Discussed wound care and need for suture removal in 7 days. Anticipate discharge home if imaging reassuring and able to ambulate. Dr. Rush Landmark to assume care at shift change. Final Clinical Impressions(s) / ED Diagnoses   Final diagnoses:  Fall, initial encounter  Facial laceration, initial encounter    ED Discharge Orders    None       Sayeed Weatherall, Jeannett Senior, MD 03/28/19 [phone]

## 2019-03-28 NOTE — ED Notes (Signed)
Patient verbalizes understanding of discharge instructions. Opportunity for questioning and answers were provided. Armband removed by staff, pt discharged from ED.  

## 2019-03-28 NOTE — ED Provider Notes (Signed)
7:11 AM Care assumed from Dr. Manus Gunning.   At time of transfer care, patient is awaiting results of CT head and cervical spine and lab work.  Patient had a fall and had a scalp laceration that was repaired.  Plan of care is to discharge patient home if his work-up is reassuring.  Anticipate discharge.  8:28 AM CT scans returned showing no fracture dislocation.  Wound remained hemostatic.  Patient had mild headache and will be given Tylenol.  Patient was able to drink and ambulate safely.  Patient's son who works at this facility came to the bedside and was given all information.  They are agreeable for discharge.  They understand return precautions and follow-up instructions.  Patient will be discharged.     Clinical Impression: 1. Fall, initial encounter   2. Facial laceration, initial encounter     Disposition: Discharge  Condition: Good  I have discussed the results, Dx and Tx plan with the pt(& family if present). He/she/they expressed understanding and agree(s) with the plan. Discharge instructions discussed at great length. Strict return precautions discussed and pt &/or family have verbalized understanding of the instructions. No further questions at time of discharge.    New Prescriptions   No medications on file    Follow Up: Renford Dills, MD [address] [place] [address] [phone]  In 1 week For suture removal      Tegeler, Canary Brim, MD 03/28/19 1106

## 2019-04-03 DIAGNOSIS — S01111D Laceration without foreign body of right eyelid and periocular area, subsequent encounter: Secondary | ICD-10-CM | POA: Diagnosis not present

## 2019-04-13 DIAGNOSIS — R21 Rash and other nonspecific skin eruption: Secondary | ICD-10-CM | POA: Diagnosis not present

## 2019-04-13 DIAGNOSIS — T7840XA Allergy, unspecified, initial encounter: Secondary | ICD-10-CM | POA: Diagnosis not present

## 2019-04-18 DIAGNOSIS — M170 Bilateral primary osteoarthritis of knee: Secondary | ICD-10-CM | POA: Diagnosis not present

## 2019-06-26 DIAGNOSIS — I10 Essential (primary) hypertension: Secondary | ICD-10-CM | POA: Diagnosis not present

## 2019-06-26 DIAGNOSIS — E1122 Type 2 diabetes mellitus with diabetic chronic kidney disease: Secondary | ICD-10-CM | POA: Diagnosis not present

## 2019-06-26 DIAGNOSIS — N183 Chronic kidney disease, stage 3 (moderate): Secondary | ICD-10-CM | POA: Diagnosis not present

## 2019-06-26 DIAGNOSIS — E7800 Pure hypercholesterolemia, unspecified: Secondary | ICD-10-CM | POA: Diagnosis not present

## 2019-07-03 DIAGNOSIS — E7800 Pure hypercholesterolemia, unspecified: Secondary | ICD-10-CM | POA: Diagnosis not present

## 2019-07-03 DIAGNOSIS — I10 Essential (primary) hypertension: Secondary | ICD-10-CM | POA: Diagnosis not present

## 2019-07-03 DIAGNOSIS — E1122 Type 2 diabetes mellitus with diabetic chronic kidney disease: Secondary | ICD-10-CM | POA: Diagnosis not present

## 2019-07-03 DIAGNOSIS — N183 Chronic kidney disease, stage 3 (moderate): Secondary | ICD-10-CM | POA: Diagnosis not present

## 2019-07-03 DIAGNOSIS — H401132 Primary open-angle glaucoma, bilateral, moderate stage: Secondary | ICD-10-CM | POA: Diagnosis not present

## 2019-07-03 DIAGNOSIS — H5201 Hypermetropia, right eye: Secondary | ICD-10-CM | POA: Diagnosis not present

## 2019-07-03 DIAGNOSIS — H2513 Age-related nuclear cataract, bilateral: Secondary | ICD-10-CM | POA: Diagnosis not present

## 2019-08-23 DIAGNOSIS — E1165 Type 2 diabetes mellitus with hyperglycemia: Secondary | ICD-10-CM | POA: Diagnosis not present

## 2019-08-23 DIAGNOSIS — I10 Essential (primary) hypertension: Secondary | ICD-10-CM | POA: Diagnosis not present

## 2019-08-23 DIAGNOSIS — E1122 Type 2 diabetes mellitus with diabetic chronic kidney disease: Secondary | ICD-10-CM | POA: Diagnosis not present

## 2019-08-23 DIAGNOSIS — E7800 Pure hypercholesterolemia, unspecified: Secondary | ICD-10-CM | POA: Diagnosis not present

## 2019-08-23 DIAGNOSIS — N183 Chronic kidney disease, stage 3 (moderate): Secondary | ICD-10-CM | POA: Diagnosis not present

## 2019-08-23 DIAGNOSIS — E1169 Type 2 diabetes mellitus with other specified complication: Secondary | ICD-10-CM | POA: Diagnosis not present

## 2019-08-23 DIAGNOSIS — E119 Type 2 diabetes mellitus without complications: Secondary | ICD-10-CM | POA: Diagnosis not present

## 2019-10-09 DIAGNOSIS — Z23 Encounter for immunization: Secondary | ICD-10-CM | POA: Diagnosis not present

## 2019-10-09 DIAGNOSIS — H401132 Primary open-angle glaucoma, bilateral, moderate stage: Secondary | ICD-10-CM | POA: Diagnosis not present

## 2019-11-19 ENCOUNTER — Encounter (HOSPITAL_COMMUNITY): Payer: Self-pay | Admitting: Emergency Medicine

## 2019-11-19 ENCOUNTER — Ambulatory Visit (HOSPITAL_COMMUNITY)
Admission: EM | Admit: 2019-11-19 | Discharge: 2019-11-19 | Disposition: A | Discharge status: Home / Self Care | Payer: Medicare Other | Attending: Family Medicine | Admitting: Family Medicine

## 2019-11-19 ENCOUNTER — Other Ambulatory Visit: Payer: Self-pay

## 2019-11-19 DIAGNOSIS — Z7982 Long term (current) use of aspirin: Secondary | ICD-10-CM | POA: Diagnosis not present

## 2019-11-19 DIAGNOSIS — R059 Cough, unspecified: Secondary | ICD-10-CM

## 2019-11-19 DIAGNOSIS — R05 Cough: Secondary | ICD-10-CM | POA: Diagnosis not present

## 2019-11-19 DIAGNOSIS — Z6832 Body mass index (BMI) 32.0-32.9, adult: Secondary | ICD-10-CM | POA: Insufficient documentation

## 2019-11-19 DIAGNOSIS — N189 Chronic kidney disease, unspecified: Secondary | ICD-10-CM | POA: Insufficient documentation

## 2019-11-19 DIAGNOSIS — E1136 Type 2 diabetes mellitus with diabetic cataract: Secondary | ICD-10-CM | POA: Insufficient documentation

## 2019-11-19 DIAGNOSIS — Z79899 Other long term (current) drug therapy: Secondary | ICD-10-CM | POA: Insufficient documentation

## 2019-11-19 DIAGNOSIS — Z7984 Long term (current) use of oral hypoglycemic drugs: Secondary | ICD-10-CM | POA: Insufficient documentation

## 2019-11-19 DIAGNOSIS — I10 Essential (primary) hypertension: Secondary | ICD-10-CM

## 2019-11-19 DIAGNOSIS — I129 Hypertensive chronic kidney disease with stage 1 through stage 4 chronic kidney disease, or unspecified chronic kidney disease: Secondary | ICD-10-CM | POA: Insufficient documentation

## 2019-11-19 DIAGNOSIS — R42 Dizziness and giddiness: Secondary | ICD-10-CM

## 2019-11-19 DIAGNOSIS — H409 Unspecified glaucoma: Secondary | ICD-10-CM | POA: Insufficient documentation

## 2019-11-19 DIAGNOSIS — U071 COVID-19: Secondary | ICD-10-CM | POA: Insufficient documentation

## 2019-11-19 DIAGNOSIS — Z87891 Personal history of nicotine dependence: Secondary | ICD-10-CM | POA: Diagnosis not present

## 2019-11-19 DIAGNOSIS — E1122 Type 2 diabetes mellitus with diabetic chronic kidney disease: Secondary | ICD-10-CM | POA: Diagnosis not present

## 2019-11-19 DIAGNOSIS — R030 Elevated blood-pressure reading, without diagnosis of hypertension: Secondary | ICD-10-CM | POA: Insufficient documentation

## 2019-11-19 DIAGNOSIS — E785 Hyperlipidemia, unspecified: Secondary | ICD-10-CM | POA: Diagnosis not present

## 2019-11-19 DIAGNOSIS — E669 Obesity, unspecified: Secondary | ICD-10-CM | POA: Insufficient documentation

## 2019-11-19 MED ORDER — PROMETHAZINE-DM 6.25-15 MG/5ML PO SYRP: 5.0000 mL | ORAL_SOLUTION | Freq: Every evening | ORAL | 0 refills | Status: DC | PRN

## 2019-11-19 MED ORDER — BENZONATATE 100 MG PO CAPS: 100.0000 mg | ORAL_CAPSULE | Freq: Three times a day (TID) | ORAL | 0 refills | Status: DC | PRN

## 2019-11-19 NOTE — Discharge Instructions (Signed)
For diabetes, please make sure you are avoiding starchy, carbohydrate foods like pasta, breads, pastry, rice, potatoes, desserts. These foods can elevated your blood sugar. Also, limit your alcohol drinking to 1 per day, avoid sodas, sweet teas. For elevated blood pressure, make sure you are monitoring salt in your diet.  Do not eat restaurant foods and limit processed foods at home.  Processed foods include things like frozen meals preseason meats and dinners.  Make sure your pain attention to sodium labels on foods you by at the grocery store.  For seasoning you can use a brand called Mrs. Dash which includes a lot of salt free seasonings.  Salads - kale, spinach, cabbage, spring mix; use seeds like pumpkin seeds or sunflower seeds, almonds; you can also use 1-2 hard boiled eggs in your salads Fruits - avocadoes, berries (blueberries, raspberries, blackberries), apples, oranges Vegetables - aspargus, cauliflower, broccoli, green beans, brussel spouts, bell peppers; stay away from starchy vegetables like potatoes, carrots, peas  Regarding meat it is better to eat lean meats and limit your red meat consumption including pork.  Wild caught fish, chicken breast are good options.  Do not eat any foods on this list that you are allergic to.  

## 2019-11-19 NOTE — ED Provider Notes (Signed)
[place]   MRN: [id number] DOB: [birth year]  Subjective:   Steven Myers is a 84 y.o. male presenting for consult regarding severely elevated blood pressure this morning.  Patient states that prior to taking his blood pressure medication, he had checked his blood pressure and was greater than 948-546 systolic.  He became concerned because he felt dizzy and wanted to make sure he got checked.  He also reports having had persistent intermittent dry cough for the past 3 weeks.  He has not reached out to his PCP for a consult.  No current facility-administered medications for this encounter.  Current Outpatient Medications:  .  amLODipine (NORVASC) 5 MG tablet, Take 5 mg by mouth daily., Disp: , Rfl: 2 .  aspirin 81 MG tablet, Take 81 mg by mouth daily.  , Disp: , Rfl:  .  brimonidine (ALPHAGAN) 0.15 % ophthalmic solution, Place 1 drop into both eyes daily., Disp: , Rfl: 0 .  dorzolamide-timolol (COSOPT) 22.3-6.8 MG/ML ophthalmic solution, Place 1 drop into both eyes 2 (two) times daily. , Disp: , Rfl: 0 .  ezetimibe (ZETIA) 10 MG tablet, Take 10 mg by mouth daily., Disp: , Rfl: 1 .  furosemide (LASIX) 20 MG tablet, take 1 tablet by mouth twice a day (Patient taking differently: Take 20 mg by mouth 2 (two) times daily. ), Disp: 60 tablet, Rfl: 6 .  glimepiride (AMARYL) 4 MG tablet, Take 4 mg by mouth daily before breakfast., Disp: , Rfl:  .  LUMIGAN 0.01 % SOLN, Place 1 drop into both eyes daily., Disp: , Rfl: 2 .  metFORMIN (GLUCOPHAGE) 1000 MG tablet, Take 1,000 mg by mouth 2 (two) times daily., Disp: , Rfl: 3 .  metoprolol succinate (TOPROL-XL) 100 MG 24 hr tablet, take 1 tablet by mouth once daily (Patient taking differently: Take 100 mg by mouth daily. ), Disp: 90 tablet, Rfl: 3 .  trandolapril (MAVIK) 4 MG tablet, take 1 tablet by mouth twice a day NEEDS OFFICE VISIT/LABS FOR REFILLS (Patient taking differently: Take 4 mg by mouth 2 (two) times a day. ), Disp: 15 tablet, Rfl: 0    No Known Allergies  Past Medical History:  Diagnosis Date  . Bilateral cataracts   . CKD (chronic kidney disease) stage 3, GFR 30-59 ml/min   . DM (diabetes mellitus) type II controlled with renal manifestation (HCC)    Not on insulin  . Essential hypertension   . Glaucoma    Optho: Dr. Valetta Close  . History of colonoscopy   . Hyperlipidemia associated with type 2 diabetes mellitus ([place])    Intolerant to statins --> because of elevated LFTs.  . Obesity due to excess calories without serious comorbidity    BMI ~32 - mostly truncal obesity (mesomorphic)  . Osteoarthritis of hip    Right > Left -- limits walking  . RBBB (right bundle branch block)   . Venous stasis dermatitis of both lower extremities - with edema    L leg worse than R 2/2 prior injury.; controlled somewhat with support hose     Past Surgical History:  Procedure Laterality Date  . NM MYOVIEW LTD  04/2010   LEXISCAN: EF 62%. NO ISCHEMIA OR INFARCTION. LOW RISK. No RWMA     Family History  Problem Relation Age of Onset  . Diabetes Mother   . Heart disease Father 64  . Colon cancer Neg Hx     Social History   Tobacco Use  . Smoking status: Former Smoker  Types: Cigarettes    Quit date: 1980    Years since quitting: 41.0  . Smokeless tobacco: Never Used  . Tobacco comment: quit 40 years ago  Substance Use Topics  . Alcohol use: Not Currently    Comment: quit over 20 yr ago  . Drug use: No    Review of Systems  Constitutional: Negative for fever and malaise/fatigue.  HENT: Negative for congestion, ear pain, sinus pain and sore throat.   Eyes: Negative for discharge and redness.  Respiratory: Positive for cough. Negative for hemoptysis, shortness of breath and wheezing.   Cardiovascular: Negative for chest pain.  Gastrointestinal: Negative for abdominal pain, diarrhea, nausea and vomiting.  Genitourinary: Negative for dysuria, flank pain and hematuria.  Musculoskeletal: Negative for myalgias.   Skin: Negative for rash.  Neurological: Positive for dizziness. Negative for tingling, sensory change, speech change, focal weakness, weakness and headaches.  Psychiatric/Behavioral: Negative for depression and substance abuse.    Objective:   Vitals: BP (!) 183/121   Pulse 77   Temp 99 F (37.2 C)   Resp 18   SpO2 96%   BP was 156/87 on recheck by PA-Tieara Flitton using left arm in seated position.   Physical Exam Constitutional:      General: He is not in acute distress.    Appearance: Normal appearance. He is well-developed. He is not ill-appearing, toxic-appearing or diaphoretic.  HENT:     Head: Normocephalic and atraumatic.     Right Ear: External ear normal.     Left Ear: External ear normal.     Nose: Congestion present.     Mouth/Throat:     Mouth: Mucous membranes are moist.     Comments: PND and throat. Eyes:     General: No scleral icterus.    Extraocular Movements: Extraocular movements intact.     Pupils: Pupils are equal, round, and reactive to light.  Cardiovascular:     Rate and Rhythm: Normal rate and regular rhythm.     Heart sounds: Normal heart sounds. No murmur. No friction rub. No gallop.   Pulmonary:     Effort: Pulmonary effort is normal. No respiratory distress.     Breath sounds: Normal breath sounds. No stridor. No wheezing, rhonchi or rales.  Skin:    General: Skin is warm and dry.  Neurological:     Mental Status: He is alert and oriented to person, place, and time.     Cranial Nerves: No cranial nerve deficit.     Motor: No weakness.     Coordination: Coordination normal.     Gait: Gait normal.     Deep Tendon Reflexes: Reflexes normal.     Comments: Negative Romberg and pronator drift.  Psychiatric:        Mood and Affect: Mood normal.        Behavior: Behavior normal.        Thought Content: Thought content normal.     Assessment and Plan :   1. Dizziness   2. Essential hypertension   3. Elevated blood pressure reading   4. Cough      Discussed with patient that it would be inappropriate to change his blood pressure medication as it is improving after he has taken his blood pressure medicine.  Last check was very reassuring at 156/87.  Will recommend supportive care for his cough and congestion including Zyrtec, cough suppression medications.  At this time patient does not have any signs of intracranial process, stroke.  We will  have patient follow-up with his PCP ASAP.  COVID-19 testing is pending.  Counseled patient on potential for adverse effects with medications prescribed/recommended today, strict ER and return-to-clinic precautions discussed, patient verbalized understanding.    Wallis BambergMani, Damone Fancher, PA-C 11/19/19 1334

## 2019-11-19 NOTE — ED Triage Notes (Addendum)
Pt states he checked his blood pressure today and it was "high." states he took his BP medicine around 8am this morning. Pt c/o dizziness for the last two weeks. Pt also c/o legs aching for 2-3 weeks. Pt also c/o bilateral eye watering. Pt c/o cough x3 weeks.

## 2019-11-20 ENCOUNTER — Telehealth: Payer: Self-pay | Admitting: Emergency Medicine

## 2019-11-20 LAB — NOVEL CORONAVIRUS, NAA (HOSP ORDER, SEND-OUT TO REF LAB; TAT 18-24 HRS): SARS-CoV-2, NAA: DETECTED — AB

## 2019-11-20 NOTE — Telephone Encounter (Signed)

## 2019-11-21 ENCOUNTER — Telehealth: Payer: Self-pay | Admitting: Infectious Diseases

## 2019-11-21 NOTE — Telephone Encounter (Signed)
Called to discuss with patient about Covid symptoms and the use of bamlanivimab, a monoclonal antibody infusion for those with mild to moderate Covid symptoms and at a high risk of hospitalization.  Pt is qualified for this infusion at the [facility] infusion center due to Age > 73   He has had intermittent dry cough x 3 weeks duration based on ED visit yesterday. I left a voicemail for Orhan to call back to discuss further in consideration for mAb infusion (likely not a candidate given duration of sx).

## 2019-11-22 ENCOUNTER — Telehealth: Payer: Self-pay | Admitting: Infectious Diseases

## 2019-11-22 NOTE — Telephone Encounter (Signed)
Error

## 2019-11-25 NOTE — Progress Notes (Signed)
Covid-19 Home Visit on behalf of [facility] on 11/24/19.  Steven Myers is being isolated in his upstairs bedroom as requested by his wife.  Assessment details transcribed into [facility] by Alcide Clever, DNP.  Steven Myers was given an incentive spirometer and observed on use.  Encouraged to use up to 10x/hour while awake as well as walking in the hallway upstairs hourly as well.

## 2019-11-26 ENCOUNTER — Other Ambulatory Visit: Payer: Self-pay

## 2019-11-26 ENCOUNTER — Inpatient Hospital Stay (HOSPITAL_COMMUNITY)
Admission: EM | Admit: 2019-11-26 | Discharge: 2019-12-01 | DRG: 179 | Disposition: A | Discharge status: Home Health | Payer: Medicare Other | Attending: Internal Medicine | Admitting: Internal Medicine

## 2019-11-26 ENCOUNTER — Emergency Department (HOSPITAL_COMMUNITY): Payer: Medicare Other

## 2019-11-26 ENCOUNTER — Encounter (HOSPITAL_COMMUNITY): Payer: Self-pay

## 2019-11-26 DIAGNOSIS — R531 Weakness: Secondary | ICD-10-CM

## 2019-11-26 DIAGNOSIS — I82409 Acute embolism and thrombosis of unspecified deep veins of unspecified lower extremity: Secondary | ICD-10-CM

## 2019-11-26 DIAGNOSIS — N1831 Chronic kidney disease, stage 3a: Secondary | ICD-10-CM | POA: Diagnosis not present

## 2019-11-26 DIAGNOSIS — Z8249 Family history of ischemic heart disease and other diseases of the circulatory system: Secondary | ICD-10-CM

## 2019-11-26 DIAGNOSIS — R000 Tachycardia, unspecified: Secondary | ICD-10-CM | POA: Diagnosis not present

## 2019-11-26 DIAGNOSIS — I4510 Unspecified right bundle-branch block: Secondary | ICD-10-CM | POA: Diagnosis not present

## 2019-11-26 DIAGNOSIS — Z7982 Long term (current) use of aspirin: Secondary | ICD-10-CM

## 2019-11-26 DIAGNOSIS — E1129 Type 2 diabetes mellitus with other diabetic kidney complication: Secondary | ICD-10-CM | POA: Diagnosis present

## 2019-11-26 DIAGNOSIS — Z79899 Other long term (current) drug therapy: Secondary | ICD-10-CM

## 2019-11-26 DIAGNOSIS — I10 Essential (primary) hypertension: Secondary | ICD-10-CM | POA: Diagnosis present

## 2019-11-26 DIAGNOSIS — Z833 Family history of diabetes mellitus: Secondary | ICD-10-CM

## 2019-11-26 DIAGNOSIS — I129 Hypertensive chronic kidney disease with stage 1 through stage 4 chronic kidney disease, or unspecified chronic kidney disease: Secondary | ICD-10-CM | POA: Diagnosis present

## 2019-11-26 DIAGNOSIS — U071 COVID-19: Secondary | ICD-10-CM | POA: Diagnosis not present

## 2019-11-26 DIAGNOSIS — Z7984 Long term (current) use of oral hypoglycemic drugs: Secondary | ICD-10-CM

## 2019-11-26 DIAGNOSIS — E1122 Type 2 diabetes mellitus with diabetic chronic kidney disease: Secondary | ICD-10-CM | POA: Diagnosis not present

## 2019-11-26 DIAGNOSIS — H409 Unspecified glaucoma: Secondary | ICD-10-CM | POA: Diagnosis not present

## 2019-11-26 DIAGNOSIS — E785 Hyperlipidemia, unspecified: Secondary | ICD-10-CM | POA: Diagnosis not present

## 2019-11-26 DIAGNOSIS — Z87891 Personal history of nicotine dependence: Secondary | ICD-10-CM

## 2019-11-26 DIAGNOSIS — R7989 Other specified abnormal findings of blood chemistry: Secondary | ICD-10-CM | POA: Diagnosis present

## 2019-11-26 DIAGNOSIS — R0902 Hypoxemia: Secondary | ICD-10-CM | POA: Diagnosis not present

## 2019-11-26 LAB — COMPREHENSIVE METABOLIC PANEL
ALT: 20 U/L (ref 0–44)
AST: 26 U/L (ref 15–41)
Albumin: 3.7 g/dL (ref 3.5–5.0)
Alkaline Phosphatase: 58 U/L (ref 38–126)
Anion gap: 12 (ref 5–15)
BUN: 20 mg/dL (ref 8–23)
CO2: 22 mmol/L (ref 22–32)
Calcium: 8.6 mg/dL — ABNORMAL LOW (ref 8.9–10.3)
Chloride: 105 mmol/L (ref 98–111)
Creatinine, Ser: 1.39 mg/dL — ABNORMAL HIGH (ref 0.61–1.24)
GFR calc Af Amer: 54 mL/min — ABNORMAL LOW (ref >60)
GFR calc non Af Amer: 47 mL/min — ABNORMAL LOW (ref >60)
Glucose, Bld: 124 mg/dL — ABNORMAL HIGH (ref 70–99)
Potassium: 3.6 mmol/L (ref 3.5–5.1)
Sodium: 139 mmol/L (ref 135–145)
Total Bilirubin: 0.6 mg/dL (ref 0.3–1.2)
Total Protein: 7.2 g/dL (ref 6.5–8.1)

## 2019-11-26 LAB — CBC WITH DIFFERENTIAL/PLATELET
Abs Immature Granulocytes: 0.01 10*3/uL (ref 0.00–0.07)
Basophils Absolute: 0 10*3/uL (ref 0.0–0.1)
Basophils Relative: 0 %
Eosinophils Absolute: 0 10*3/uL (ref 0.0–0.5)
Eosinophils Relative: 0 %
HCT: 49.8 % (ref 39.0–52.0)
Hemoglobin: 16 g/dL (ref 13.0–17.0)
Immature Granulocytes: 0 %
Lymphocytes Relative: 5 %
Lymphs Abs: 0.3 10*3/uL — ABNORMAL LOW (ref 0.7–4.0)
MCH: 27.2 pg (ref 26.0–34.0)
MCHC: 32.1 g/dL (ref 30.0–36.0)
MCV: 84.7 fL (ref 80.0–100.0)
Monocytes Absolute: 0.6 10*3/uL (ref 0.1–1.0)
Monocytes Relative: 10 %
Neutro Abs: 4.8 10*3/uL (ref 1.7–7.7)
Neutrophils Relative %: 85 %
Platelets: 172 10*3/uL (ref 150–400)
RBC: 5.88 MIL/uL — ABNORMAL HIGH (ref 4.22–5.81)
RDW: 14.7 % (ref 11.5–15.5)
WBC: 5.7 10*3/uL (ref 4.0–10.5)
nRBC: 0 % (ref 0.0–0.2)

## 2019-11-26 LAB — URINALYSIS, ROUTINE W REFLEX MICROSCOPIC
Bilirubin Urine: NEGATIVE
Glucose, UA: NEGATIVE mg/dL
Hgb urine dipstick: NEGATIVE
Ketones, ur: 5 mg/dL — AB
Leukocytes,Ua: NEGATIVE
Nitrite: NEGATIVE
Protein, ur: 100 mg/dL — AB
Specific Gravity, Urine: 1.012 (ref 1.005–1.030)
pH: 7 (ref 5.0–8.0)

## 2019-11-26 LAB — PROCALCITONIN: Procalcitonin: <0.1 ng/mL

## 2019-11-26 LAB — LACTIC ACID, PLASMA
Lactic Acid, Venous: 1.1 mmol/L (ref 0.5–1.9)
Lactic Acid, Venous: 0.9 mmol/L (ref 0.5–1.9)

## 2019-11-26 LAB — D-DIMER, QUANTITATIVE: D-Dimer, Quant: 0.75 ug/mL-FEU — ABNORMAL HIGH (ref 0.00–0.50)

## 2019-11-26 LAB — FERRITIN: Ferritin: 70 ng/mL (ref 24–336)

## 2019-11-26 LAB — C-REACTIVE PROTEIN: CRP: 5.4 mg/dL — ABNORMAL HIGH (ref <1.0)

## 2019-11-26 LAB — TRIGLYCERIDES: Triglycerides: 88 mg/dL (ref <150)

## 2019-11-26 LAB — FIBRINOGEN: Fibrinogen: 669 mg/dL — ABNORMAL HIGH (ref 210–475)

## 2019-11-26 LAB — LACTATE DEHYDROGENASE: LDH: 195 U/L — ABNORMAL HIGH (ref 98–192)

## 2019-11-26 MED ORDER — SODIUM CHLORIDE 0.9 % IV SOLN
200.0000 mg | Freq: Once | INTRAVENOUS | Status: AC
  Administered 2019-11-27: 200 mg via INTRAVENOUS
  Filled 2019-11-26: qty 200

## 2019-11-26 MED ORDER — SODIUM CHLORIDE 0.9 % IV SOLN
100.0000 mg | Freq: Every day | INTRAVENOUS | Status: AC
  Administered 2019-11-27 – 2019-11-30 (×4): 100 mg via INTRAVENOUS
  Filled 2019-11-26 (×4): qty 100

## 2019-11-26 MED ORDER — METHYLPREDNISOLONE 4 MG PO TBPK: 4.0000 mg | ORAL_TABLET | ORAL | Status: DC

## 2019-11-26 MED ORDER — ONDANSETRON HCL 4 MG/2ML IJ SOLN: 4.0000 mg | Freq: Four times a day (QID) | INTRAMUSCULAR | Status: DC | PRN

## 2019-11-26 MED ORDER — ACETAMINOPHEN 325 MG PO TABS
650.0000 mg | ORAL_TABLET | Freq: Once | ORAL | Status: AC
  Administered 2019-11-26: 18:00:00 650 mg via ORAL
  Filled 2019-11-26: qty 2

## 2019-11-26 MED ORDER — PROMETHAZINE-DM 6.25-15 MG/5ML PO SYRP: 5.0000 mL | ORAL_SOLUTION | Freq: Every evening | ORAL | Status: DC | PRN

## 2019-11-26 MED ORDER — AMLODIPINE BESYLATE 5 MG PO TABS
5.0000 mg | ORAL_TABLET | Freq: Every day | ORAL | Status: DC
  Administered 2019-11-27 – 2019-12-01 (×5): 5 mg via ORAL
  Filled 2019-11-26 (×5): qty 1

## 2019-11-26 MED ORDER — LATANOPROST 0.005 % OP SOLN
1.0000 [drp] | Freq: Every day | OPHTHALMIC | Status: DC
  Filled 2019-11-26: qty 2.5

## 2019-11-26 MED ORDER — ALBUTEROL SULFATE HFA 108 (90 BASE) MCG/ACT IN AERS: 2.0000 | INHALATION_SPRAY | RESPIRATORY_TRACT | Status: DC | PRN

## 2019-11-26 MED ORDER — ASPIRIN EC 81 MG PO TBEC
81.0000 mg | DELAYED_RELEASE_TABLET | Freq: Every day | ORAL | Status: DC
  Administered 2019-11-27 – 2019-12-01 (×5): 81 mg via ORAL
  Filled 2019-11-26 (×5): qty 1

## 2019-11-26 MED ORDER — SODIUM CHLORIDE 0.9 % IV BOLUS
500.0000 mL | Freq: Once | INTRAVENOUS | Status: AC
  Administered 2019-11-26: 500 mL via INTRAVENOUS

## 2019-11-26 MED ORDER — BENZONATATE 100 MG PO CAPS: 100.0000 mg | ORAL_CAPSULE | Freq: Three times a day (TID) | ORAL | Status: DC | PRN

## 2019-11-26 MED ORDER — BRIMONIDINE TARTRATE 0.15 % OP SOLN
1.0000 [drp] | Freq: Every day | OPHTHALMIC | Status: DC
  Administered 2019-11-27 – 2019-12-01 (×5): 1 [drp] via OPHTHALMIC
  Filled 2019-11-26: qty 5

## 2019-11-26 MED ORDER — INSULIN ASPART 100 UNIT/ML ~~LOC~~ SOLN
0.0000 [IU] | Freq: Three times a day (TID) | SUBCUTANEOUS | Status: DC
  Administered 2019-11-27 – 2019-11-28 (×3): 2 [IU] via SUBCUTANEOUS
  Administered 2019-11-28 – 2019-11-30 (×2): 3 [IU] via SUBCUTANEOUS
  Administered 2019-12-01: 2 [IU] via SUBCUTANEOUS
  Filled 2019-11-26: qty 0.15

## 2019-11-26 MED ORDER — METFORMIN HCL 500 MG PO TABS: 1000.0000 mg | ORAL_TABLET | Freq: Two times a day (BID) | ORAL | Status: DC

## 2019-11-26 MED ORDER — TRANDOLAPRIL 4 MG PO TABS
4.0000 mg | ORAL_TABLET | Freq: Every day | ORAL | Status: DC
  Administered 2019-11-27 – 2019-12-01 (×5): 4 mg via ORAL
  Filled 2019-11-26 (×5): qty 1

## 2019-11-26 MED ORDER — ACETAMINOPHEN 325 MG PO TABS
650.0000 mg | ORAL_TABLET | Freq: Four times a day (QID) | ORAL | Status: DC | PRN
  Administered 2019-11-27: 650 mg via ORAL
  Filled 2019-11-26: qty 2

## 2019-11-26 MED ORDER — EZETIMIBE 10 MG PO TABS
10.0000 mg | ORAL_TABLET | Freq: Every day | ORAL | Status: DC
  Administered 2019-11-27 – 2019-12-01 (×5): 10 mg via ORAL
  Filled 2019-11-26 (×5): qty 1

## 2019-11-26 MED ORDER — INSULIN ASPART 100 UNIT/ML ~~LOC~~ SOLN
0.0000 [IU] | Freq: Every day | SUBCUTANEOUS | Status: DC
  Filled 2019-11-26: qty 0.05

## 2019-11-26 MED ORDER — DORZOLAMIDE HCL-TIMOLOL MAL 2-0.5 % OP SOLN
1.0000 [drp] | Freq: Two times a day (BID) | OPHTHALMIC | Status: DC
  Administered 2019-11-27 – 2019-12-01 (×9): 1 [drp] via OPHTHALMIC
  Filled 2019-11-26: qty 10

## 2019-11-26 MED ORDER — ENOXAPARIN SODIUM 40 MG/0.4ML ~~LOC~~ SOLN
40.0000 mg | Freq: Every day | SUBCUTANEOUS | Status: DC
  Administered 2019-11-27 – 2019-11-30 (×4): 40 mg via SUBCUTANEOUS
  Filled 2019-11-26 (×4): qty 0.4

## 2019-11-26 MED ORDER — ONDANSETRON HCL 4 MG PO TABS: 4.0000 mg | ORAL_TABLET | Freq: Four times a day (QID) | ORAL | Status: DC | PRN

## 2019-11-26 MED ORDER — FUROSEMIDE 20 MG PO TABS
20.0000 mg | ORAL_TABLET | Freq: Two times a day (BID) | ORAL | Status: DC
  Administered 2019-11-27 – 2019-12-01 (×9): 20 mg via ORAL
  Filled 2019-11-26 (×9): qty 1

## 2019-11-26 MED ORDER — ALBUTEROL SULFATE HFA 108 (90 BASE) MCG/ACT IN AERS
2.0000 | INHALATION_SPRAY | Freq: Four times a day (QID) | RESPIRATORY_TRACT | Status: DC
  Administered 2019-11-27 – 2019-12-01 (×17): 2 via RESPIRATORY_TRACT
  Filled 2019-11-26: qty 6.7

## 2019-11-26 NOTE — H&P (Signed)
History and Physical    [name] MWU:[id number] DOB: 20-May-1935 DOA: 11/26/2019  PCP: Renford Dills, MD  Patient coming from: Home  I have personally briefly reviewed patient's old medical records in [facility]  Chief Complaint: Generalized weakness  HPI: Steven Myers is a 85 y.o. male with medical history significant of DM2, HTN.  Patient diagnosed with COVID on 12/28.  Patient presents to ED with worsening generalized weakness.  He has had 3+ weeks of cough, COVID symptoms apparently.  Started on medrol dose pak x2 days ago at home.  He is having difficulty with ambulating in his home due to generalized weakness. He has to hold onto items to get around the home. He lives at home with his wife, who also has a coronavirus infection.  He denies any fever, chest pain, shortness of breath, nausea, vomiting, abdominal pain, diarrhea. He is taken cough medicine for his symptoms, no additional treatments. He does have minimal cough. EMS reports of fever to 101.1. Symptoms are moderate and constant nature.   ED Course: No O2 requirement.  procalcitonin neg, WBC nl, gets tachy with standing and requires 2 person assist.  CRP 5.4, D.Dimer of 0.75.   Review of Systems: As per HPI, otherwise all review of systems negative.  Past Medical History:  Diagnosis Date  . Bilateral cataracts   . CKD (chronic kidney disease) stage 3, GFR 30-59 ml/min   . DM (diabetes mellitus) type II controlled with renal manifestation (HCC)    Not on insulin  . Essential hypertension   . Glaucoma    Optho: Dr. Cathey Endow  . History of colonoscopy   . Hyperlipidemia associated with type 2 diabetes mellitus (HCC)    Intolerant to statins --> because of elevated LFTs.  . Obesity due to excess calories without serious comorbidity    BMI ~32 - mostly truncal obesity (mesomorphic)  . Osteoarthritis of hip    Right > Left -- limits walking  . RBBB (right bundle branch block)   . Venous stasis dermatitis  of both lower extremities - with edema    L leg worse than R 2/2 prior injury.; controlled somewhat with support hose    Past Surgical History:  Procedure Laterality Date  . NM MYOVIEW LTD  04/2010   LEXISCAN: EF 62%. NO ISCHEMIA OR INFARCTION. LOW RISK. No RWMA      reports that he quit smoking about 41 years ago. His smoking use included cigarettes. He has never used smokeless tobacco. He reports previous alcohol use. He reports that he does not use drugs.  No Known Allergies  Family History  Problem Relation Age of Onset  . Diabetes Mother   . Heart disease Father 35  . Colon cancer Neg Hx      Prior to Admission medications   Medication Sig Start Date End Date Taking? Authorizing Provider  albuterol (VENTOLIN HFA) 108 (90 Base) MCG/ACT inhaler Inhale 2 puffs into the lungs every 4 (four) hours as needed for wheezing or shortness of breath. 11/23/19  Yes [provider]  amLODipine (NORVASC) 5 MG tablet Take 5 mg by mouth daily. 04/03/18  Yes [provider]  aspirin 81 MG tablet Take 81 mg by mouth daily.     Yes [provider]  benzonatate (TESSALON) 100 MG capsule Take 1-2 capsules (100-200 mg total) by mouth 3 (three) times daily as needed. Patient taking differently: Take 100-200 mg by mouth 3 (three) times daily as needed for cough.  11/19/19  Yes  Jaynee Eagles, PA-C  brimonidine (ALPHAGAN) 0.15 % ophthalmic solution Place 1 drop into both eyes daily. 05/23/18  Yes [provider]  dorzolamide-timolol (COSOPT) 22.3-6.8 MG/ML ophthalmic solution Place 1 drop into both eyes 2 (two) times daily.  05/27/18  Yes [provider]  ezetimibe (ZETIA) 10 MG tablet Take 10 mg by mouth daily. 03/21/18  Yes [provider]  furosemide (LASIX) 20 MG tablet take 1 tablet by mouth twice a day Patient taking differently: Take 20 mg by mouth 2 (two) times daily.  07/08/12  Yes Josue Hector, MD  glimepiride (AMARYL) 4 MG tablet Take 4 mg by  mouth daily before breakfast.   Yes [provider]  LUMIGAN 0.01 % SOLN Place 1 drop into both eyes daily. 04/13/18  Yes [provider]  metFORMIN (GLUCOPHAGE) 1000 MG tablet Take 1,000 mg by mouth 2 (two) times daily. 05/02/18  Yes [provider]  methylPREDNISolone (MEDROL DOSEPAK) 4 MG TBPK tablet Take 4-24 mg by mouth See admin instructions. Take 6 tablets by mouth for 1 day, then 5 tablets by mouth for 1 day, then 4 tablets by mouth for 1 day, then 3 tablets by mouth for 1 day, then 2 tablets by mouth for 1 day, then 1 tablet by mouth for one day 11/23/19  Yes [provider]  metoprolol succinate (TOPROL-XL) 100 MG 24 hr tablet take 1 tablet by mouth once daily Patient taking differently: Take 100 mg by mouth daily.  05/28/12  Yes Argentina Donovan, PA-C  promethazine-dextromethorphan (PROMETHAZINE-DM) 6.25-15 MG/5ML syrup Take 5 mLs by mouth at bedtime as needed for cough. 11/19/19  Yes Jaynee Eagles, PA-C  trandolapril (MAVIK) 4 MG tablet take 1 tablet by mouth twice a day NEEDS OFFICE VISIT/LABS FOR REFILLS Patient taking differently: Take 4 mg by mouth 2 (two) times a day.  07/13/12  Yes Rise Mu, PA-C    Physical Exam: Vitals:   11/26/19 2000 11/26/19 2100 11/26/19 2103 11/26/19 2112  BP: (!) 149/95 (!) 159/98    Pulse: 100 99 (!) 113 (!) 104  Resp: (!) 21 17 (!) 28 19  Temp:      TempSrc:      SpO2: 97% 96% 91% 100%    Constitutional: NAD, calm, comfortable Eyes: PERRL, lids and conjunctivae normal ENMT: Mucous membranes are moist. Posterior pharynx clear of any exudate or lesions.Normal dentition.  Neck: normal, supple, no masses, no thyromegaly Respiratory: clear to auscultation bilaterally, no wheezing, no crackles. Normal respiratory effort. No accessory muscle use.  Cardiovascular: Regular rate and rhythm, no murmurs / rubs / gallops. No extremity edema. 2+ pedal pulses. No carotid bruits.  Abdomen: no tenderness, no masses palpated. No  hepatosplenomegaly. Bowel sounds positive.  Musculoskeletal: no clubbing / cyanosis. No joint deformity upper and lower extremities. Good ROM, no contractures. Normal muscle tone.  Skin: no rashes, lesions, ulcers. No induration Neurologic: CN 2-12 grossly intact. Sensation intact, DTR normal. Strength 5/5 in all 4.  Psychiatric: Normal judgment and insight. Alert and oriented x 3. Normal mood.    Labs on Admission: I have personally reviewed following labs and imaging studies  CBC: Recent Labs  Lab 11/26/19 1753  WBC 5.7  NEUTROABS 4.8  HGB 16.0  HCT 49.8  MCV 84.7  PLT 130   Basic Metabolic Panel: Recent Labs  Lab 11/26/19 1753  NA 139  K 3.6  CL 105  CO2 22  GLUCOSE 124*  BUN 20  CREATININE 1.39*  CALCIUM 8.6*  GFR: CrCl cannot be calculated (Unknown ideal weight.). Liver Function Tests: Recent Labs  Lab 11/26/19 1753  AST 26  ALT 20  ALKPHOS 58  BILITOT 0.6  PROT 7.2  ALBUMIN 3.7   No results for input(s): LIPASE, AMYLASE in the last 168 hours. No results for input(s): AMMONIA in the last 168 hours. Coagulation Profile: No results for input(s): INR, PROTIME in the last 168 hours. Cardiac Enzymes: No results for input(s): CKTOTAL, CKMB, CKMBINDEX, TROPONINI in the last 168 hours. BNP (last 3 results) No results for input(s): PROBNP in the last 8760 hours. HbA1C: No results for input(s): HGBA1C in the last 72 hours. CBG: No results for input(s): GLUCAP in the last 168 hours. Lipid Profile: Recent Labs    11/26/19 1753  TRIG 88   Thyroid Function Tests: No results for input(s): TSH, T4TOTAL, FREET4, T3FREE, THYROIDAB in the last 72 hours. Anemia Panel: Recent Labs    11/26/19 1753  FERRITIN 70   Urine analysis:    Component Value Date/Time   COLORURINE YELLOW 11/26/2019 2058   APPEARANCEUR CLEAR 11/26/2019 2058   LABSPEC 1.012 11/26/2019 2058   PHURINE 7.0 11/26/2019 2058   GLUCOSEU NEGATIVE 11/26/2019 2058   HGBUR NEGATIVE 11/26/2019  2058   BILIRUBINUR NEGATIVE 11/26/2019 2058   KETONESUR 5 (A) 11/26/2019 2058   PROTEINUR 100 (A) 11/26/2019 2058   UROBILINOGEN 1.0 01/26/2012 2310   NITRITE NEGATIVE 11/26/2019 2058   LEUKOCYTESUR NEGATIVE 11/26/2019 2058    Radiological Exams on Admission: DG Chest Port 1 View  Result Date: 11/26/2019 CLINICAL DATA:  Weakness COVID positivity, initial encounter EXAM: PORTABLE CHEST 1 VIEW COMPARISON:  05/25/2018 FINDINGS: The heart size and mediastinal contours are within normal limits. Both lungs are clear. The visualized skeletal structures are unremarkable. IMPRESSION: No active disease. Electronically Signed   By: Alcide Clever M.D.   On: 11/26/2019 18:16    EKG: Independently reviewed.  Assessment/Plan Principal Problem:   COVID-19 virus infection Active Problems:   Essential hypertension   DM (diabetes mellitus) type II controlled with renal manifestation (HCC)   Generalized weakness    1. COVID-19 virus infection - 1. COVID pathway 2. Remdesivir per pharm 3. Daily labs 4. No O2 requirement at this time 2. Generalized weakness - 1. Due likely to combination of COVID-19 and steroids 2. Will hold steroids at this point since he is not hypoxic. 3. DM2 - 1. Hold home PO hypoglycemics 2. Mod scale SSI AC/HS 4. HTN - 1. Continue home BP meds  DVT prophylaxis: Lovenox Code Status: Full Family Communication: No family in room Disposition Plan: TBD Consults called: None Admission status: Place in 38    GARDNER, JARED M. DO [facility]  How to contact the [facility] Attending or Consulting provider 7A - 7P or covering provider during after hours 7P -7A, for this patient?  1. Check the care team in [facility] and look for a) attending/consulting [facility] provider listed and b) the [facility] team listed 2. Log into [email or web address]  Amion Physician Scheduling and messaging for groups and whole hospitals  On call and physician scheduling software for group practices, residents,  hospitalists and other medical providers for call, clinic, rotation and shift schedules. On[employer] is a hospital-wide system for scheduling doctors and paging doctors on call. EasyPlot is for scientific plotting and data analysis.  [email or web address]  and use [facility] universal password to access. If you do not have the password, please contact the hospital operator.  3. Locate the [facility] provider you are  looking for under [facility] and page to a number that you can be directly reached. 4. If you still have difficulty reaching the provider, please page the [facility] (Director on Call) for the Hospitalists listed on amion for assistance.  11/26/2019, 11:22 PM

## 2019-11-26 NOTE — ED Provider Notes (Signed)
[facility]-EMERGENCY DEPT Provider Note   CSN: [id number] Arrival date & time: 11/26/19  1726     History Chief Complaint  Patient presents with  . Weakness  . Covid +    Steven Myers is a 85 y.o. male.  The history is provided by the patient. No language interpreter was used.  Weakness  Steven Myers is a 85 y.o. male who presents to the Emergency Department complaining of weakness, COVID positive. He presents to the emergency department by EMS for evaluation of progressive weakness. He was diagnosed with COVID-19 infection on December 28. He states that since that time he is developed significant fatigue and malaise. He is having difficulty with ambulating in his home due to generalized weakness. He has to hold onto items to get around the home. He lives at home with his wife, who also has a coronavirus infection. She is seeking treatment with monoclonal antibody therapy. He denies any fever, chest pain, shortness of breath, nausea, vomiting, abdominal pain, diarrhea. He is taken cough medicine for his symptoms, no additional treatments. He does have minimal cough. EMS reports of fever to 101.1. Symptoms are moderate and constant nature.    Past Medical History:  Diagnosis Date  . Bilateral cataracts   . CKD (chronic kidney disease) stage 3, GFR 30-59 ml/min   . DM (diabetes mellitus) type II controlled with renal manifestation (HCC)    Not on insulin  . Essential hypertension   . Glaucoma    Optho: Dr. Cathey Endow  . History of colonoscopy   . Hyperlipidemia associated with type 2 diabetes mellitus (HCC)    Intolerant to statins --> because of elevated LFTs.  . Obesity due to excess calories without serious comorbidity    BMI ~32 - mostly truncal obesity (mesomorphic)  . Osteoarthritis of hip    Right > Left -- limits walking  . RBBB (right bundle branch block)   . Venous stasis dermatitis of both lower extremities - with edema    L leg worse than R 2/2  prior injury.; controlled somewhat with support hose    Patient Active Problem List   Diagnosis Date Noted  . Generalized weakness 11/26/2019  . COVID-19 virus infection 11/26/2019  . Essential hypertension   . DM (diabetes mellitus) type II controlled with renal manifestation (HCC)   . Hyperlipidemia associated with type 2 diabetes mellitus (HCC)   . Venous stasis dermatitis of both lower extremities - with edema   . DOE (dyspnea on exertion) 06/08/2018  . Edema of both lower extremities 06/08/2018  . RBBB 05/05/2010  . Precordial chest pain 05/05/2010  . GASTRITIS 10/14/2009  . FECAL OCCULT BLOOD 09/09/2009    Past Surgical History:  Procedure Laterality Date  . NM MYOVIEW LTD  04/2010   LEXISCAN: EF 62%. NO ISCHEMIA OR INFARCTION. LOW RISK. No RWMA        Family History  Problem Relation Age of Onset  . Diabetes Mother   . Heart disease Father 25  . Colon cancer Neg Hx     Social History   Tobacco Use  . Smoking status: Former Smoker    Types: Cigarettes    Quit date: 1980    Years since quitting: 41.0  . Smokeless tobacco: Never Used  . Tobacco comment: quit 40 years ago  Substance Use Topics  . Alcohol use: Not Currently    Comment: quit over 20 yr ago  . Drug use: No    Home Medications Prior to Admission  medications   Medication Sig Start Date End Date Taking? Authorizing Provider  albuterol (VENTOLIN HFA) 108 (90 Base) MCG/ACT inhaler Inhale 2 puffs into the lungs every 4 (four) hours as needed for wheezing or shortness of breath. 11/23/19  Yes [provider]  amLODipine (NORVASC) 5 MG tablet Take 5 mg by mouth daily. 04/03/18  Yes [provider]  aspirin 81 MG tablet Take 81 mg by mouth daily.     Yes [provider]  benzonatate (TESSALON) 100 MG capsule Take 1-2 capsules (100-200 mg total) by mouth 3 (three) times daily as needed. Patient taking differently: Take 100-200 mg by mouth 3 (three) times daily as needed for  cough.  11/19/19  Yes Wallis Bamberg, PA-C  brimonidine (ALPHAGAN) 0.15 % ophthalmic solution Place 1 drop into both eyes daily. 05/23/18  Yes [provider]  dorzolamide-timolol (COSOPT) 22.3-6.8 MG/ML ophthalmic solution Place 1 drop into both eyes 2 (two) times daily.  05/27/18  Yes [provider]  ezetimibe (ZETIA) 10 MG tablet Take 10 mg by mouth daily. 03/21/18  Yes [provider]  furosemide (LASIX) 20 MG tablet take 1 tablet by mouth twice a day Patient taking differently: Take 20 mg by mouth 2 (two) times daily.  07/08/12  Yes Wendall Stade, MD  glimepiride (AMARYL) 4 MG tablet Take 4 mg by mouth daily before breakfast.   Yes [provider]  LUMIGAN 0.01 % SOLN Place 1 drop into both eyes daily. 04/13/18  Yes [provider]  metFORMIN (GLUCOPHAGE) 1000 MG tablet Take 1,000 mg by mouth 2 (two) times daily. 05/02/18  Yes [provider]  methylPREDNISolone (MEDROL DOSEPAK) 4 MG TBPK tablet Take 4-24 mg by mouth See admin instructions. Take 6 tablets by mouth for 1 day, then 5 tablets by mouth for 1 day, then 4 tablets by mouth for 1 day, then 3 tablets by mouth for 1 day, then 2 tablets by mouth for 1 day, then 1 tablet by mouth for one day 11/23/19  Yes [provider]  metoprolol succinate (TOPROL-XL) 100 MG 24 hr tablet take 1 tablet by mouth once daily Patient taking differently: Take 100 mg by mouth daily.  05/28/12  Yes Anders Simmonds, PA-C  promethazine-dextromethorphan (PROMETHAZINE-DM) 6.25-15 MG/5ML syrup Take 5 mLs by mouth at bedtime as needed for cough. 11/19/19  Yes Wallis Bamberg, PA-C  trandolapril (MAVIK) 4 MG tablet take 1 tablet by mouth twice a day NEEDS OFFICE VISIT/LABS FOR REFILLS Patient taking differently: Take 4 mg by mouth 2 (two) times a day.  07/13/12  Yes Sondra Barges, PA-C    Allergies    Patient has no known allergies.  Review of Systems   Review of Systems  Neurological: Positive for weakness.  All  other systems reviewed and are negative.   Physical Exam Updated Vital Signs BP (!) 154/104   Pulse 94   Temp 99.6 F (37.6 C) (Oral)   Resp 17   SpO2 99%   Physical Exam Vitals and nursing note reviewed.  Constitutional:      Appearance: He is well-developed.  HENT:     Head: Normocephalic and atraumatic.  Cardiovascular:     Rate and Rhythm: Regular rhythm. Tachycardia present.     Heart sounds: No murmur.  Pulmonary:     Effort: Pulmonary effort is normal. No respiratory distress.  Abdominal:     Palpations: Abdomen is soft.     Tenderness: There is no abdominal tenderness. There is no  guarding or rebound.  Musculoskeletal:        General: No swelling or tenderness.     Comments: 2+ radial and DP pulses bilaterally.  Skin:    General: Skin is warm and dry.  Neurological:     Mental Status: He is alert and oriented to person, place, and time.     Comments: Five out of five strength in all four extremities with sensation to light touch intact in all four extremities.  Psychiatric:        Behavior: Behavior normal.     ED Results / Procedures / Treatments   Labs (all labs ordered are listed, but only abnormal results are displayed) Labs Reviewed  CBC WITH DIFFERENTIAL/PLATELET - Abnormal; Notable for the following components:      Result Value   RBC 5.88 (*)    Lymphs Abs 0.3 (*)    All other components within normal limits  COMPREHENSIVE METABOLIC PANEL - Abnormal; Notable for the following components:   Glucose, Bld 124 (*)    Creatinine, Ser 1.39 (*)    Calcium 8.6 (*)    GFR calc non Af Amer 47 (*)    GFR calc Af Amer 54 (*)    All other components within normal limits  D-DIMER, QUANTITATIVE (NOT AT [facility]) - Abnormal; Notable for the following components:   D-Dimer, Quant 0.75 (*)    All other components within normal limits  LACTATE DEHYDROGENASE - Abnormal; Notable for the following components:   LDH 195 (*)    All other components within normal limits   FIBRINOGEN - Abnormal; Notable for the following components:   Fibrinogen 669 (*)    All other components within normal limits  C-REACTIVE PROTEIN - Abnormal; Notable for the following components:   CRP 5.4 (*)    All other components within normal limits  URINALYSIS, ROUTINE W REFLEX MICROSCOPIC - Abnormal; Notable for the following components:   Ketones, ur 5 (*)    Protein, ur 100 (*)    Bacteria, UA RARE (*)    All other components within normal limits  CBG MONITORING, ED - Abnormal; Notable for the following components:   Glucose-Capillary 117 (*)    All other components within normal limits  CULTURE, BLOOD (ROUTINE X 2)  CULTURE, BLOOD (ROUTINE X 2)  LACTIC ACID, PLASMA  LACTIC ACID, PLASMA  PROCALCITONIN  FERRITIN  TRIGLYCERIDES  HEMOGLOBIN A1C  CBC WITH DIFFERENTIAL/PLATELET  COMPREHENSIVE METABOLIC PANEL  C-REACTIVE PROTEIN  D-DIMER, QUANTITATIVE (NOT AT [facility])  ABO/RH    EKG EKG Interpretation  Date/Time:  Sunday November 26 2019 17:49:27 EST Ventricular Rate:  118 PR Interval:    QRS Duration: 134 QT Interval:  354 QTC Calculation: 496 R Axis:   -68 Text Interpretation: Sinus tachycardia Right bundle branch block Inferior infarct, age indeterminate Lateral leads are also involved since last tracing no significant change Confirmed by Daleen Bo [phone]) on 11/26/2019 6:35:24 PM   Radiology DG Chest Port 1 View  Result Date: 11/26/2019 CLINICAL DATA:  Weakness COVID positivity, initial encounter EXAM: PORTABLE CHEST 1 VIEW COMPARISON:  05/25/2018 FINDINGS: The heart size and mediastinal contours are within normal limits. Both lungs are clear. The visualized skeletal structures are unremarkable. IMPRESSION: No active disease. Electronically Signed   By: Inez Catalina M.D.   On: 11/26/2019 18:16    Procedures Procedures (including critical care time)  Medications Ordered in ED Medications  amLODipine (NORVASC) tablet 5 mg (has no administration in time  range)  aspirin EC  tablet 81 mg (has no administration in time range)  latanoprost (XALATAN) 0.005 % ophthalmic solution 1 drop (has no administration in time range)  dorzolamide-timolol (COSOPT) 22.3-6.8 MG/ML ophthalmic solution 1 drop (has no administration in time range)  ezetimibe (ZETIA) tablet 10 mg (has no administration in time range)  furosemide (LASIX) tablet 20 mg (has no administration in time range)  trandolapril (MAVIK) tablet 4 mg (has no administration in time range)  brimonidine (ALPHAGAN) 0.15 % ophthalmic solution 1 drop (has no administration in time range)  benzonatate (TESSALON) capsule 100-200 mg (has no administration in time range)  albuterol (VENTOLIN HFA) 108 (90 Base) MCG/ACT inhaler 2 puff (has no administration in time range)  insulin aspart (novoLOG) injection 0-15 Units (has no administration in time range)  insulin aspart (novoLOG) injection 0-5 Units (0 Units Subcutaneous Not Given 11/27/19 0009)  enoxaparin (LOVENOX) injection 40 mg (40 mg Subcutaneous Given 11/27/19 0007)  remdesivir 200 mg in sodium chloride 0.9% 250 mL IVPB (200 mg Intravenous New Bag/Given 11/27/19 0015)    Followed by  remdesivir 100 mg in sodium chloride 0.9 % 100 mL IVPB (has no administration in time range)  albuterol (VENTOLIN HFA) 108 (90 Base) MCG/ACT inhaler 2 puff (has no administration in time range)  acetaminophen (TYLENOL) tablet 650 mg (has no administration in time range)  ondansetron (ZOFRAN) tablet 4 mg (has no administration in time range)    Or  ondansetron (ZOFRAN) injection 4 mg (has no administration in time range)  acetaminophen (TYLENOL) tablet 650 mg (650 mg Oral Given 11/26/19 1820)  sodium chloride 0.9 % bolus 500 mL (0 mLs Intravenous Stopped 11/26/19 2000)    ED Course  I have reviewed the triage vital signs and the nursing notes.  Pertinent labs & imaging results that were available during my care of the patient were reviewed by me and considered in my medical  decision making (see chart for details).    MDM Rules/Calculators/A&P                     Patient with recent outpatient diagnosis of COVID-19 here for evaluation of progressive weakness. He is tachycardic with generalized weakness on evaluation. There is no evidence of acute infectious process. At rest his heart rate is around one teens and with standing at the bedside his heart rate climbs to 130 to 140 and he requires one person assist for steadiness. Given tachycardia and generalized weakness recommend observation admission and setting of COVID-19 infection. Discussed with patient and wife findings of studies and recommendation for admission and they are in agreement with treatment plan.   Final Clinical Impression(s) / ED Diagnoses Final diagnoses:  Generalized weakness  COVID-19 virus infection    Rx / DC Orders ED Discharge Orders    None       Tilden Fossa, MD 11/27/19 [phone]

## 2019-11-26 NOTE — ED Triage Notes (Signed)
EMS reports from home Pt Covid + on 12/28, Pt c/o increasing weakness and now states unable to ambulate.  BP 190/110 HR 128 RR 18 Sp02 94 RA CBG 127 Temp 101.1

## 2019-11-26 NOTE — ED Notes (Signed)
During ambulation, pt initally had minor trouble w/ standing up, pt stood, pt ambulated a few times, very slow and small steps back and forth to door and to bed, pt reported only "A little bit" lightheaded, pt denied any ShOB, pt SpO2 maintained the upper 90's with the lowest being 96% R/A to 99% w/ ambulating, pt HR increased to approx 124HR w/ ambulating. Pt required assistance w/ walking to ensure no fall.

## 2019-11-27 ENCOUNTER — Inpatient Hospital Stay (HOSPITAL_COMMUNITY)
Admit: 2019-11-27 | Discharge: 2019-11-27 | Disposition: A | Discharge status: Home / Self Care | Payer: Medicare Other | Attending: Internal Medicine | Admitting: Internal Medicine

## 2019-11-27 DIAGNOSIS — R791 Abnormal coagulation profile: Secondary | ICD-10-CM | POA: Diagnosis not present

## 2019-11-27 DIAGNOSIS — I129 Hypertensive chronic kidney disease with stage 1 through stage 4 chronic kidney disease, or unspecified chronic kidney disease: Secondary | ICD-10-CM | POA: Diagnosis present

## 2019-11-27 DIAGNOSIS — Z7982 Long term (current) use of aspirin: Secondary | ICD-10-CM | POA: Diagnosis not present

## 2019-11-27 DIAGNOSIS — M7989 Other specified soft tissue disorders: Secondary | ICD-10-CM

## 2019-11-27 DIAGNOSIS — R0602 Shortness of breath: Secondary | ICD-10-CM | POA: Diagnosis not present

## 2019-11-27 DIAGNOSIS — N1831 Chronic kidney disease, stage 3a: Secondary | ICD-10-CM | POA: Diagnosis present

## 2019-11-27 DIAGNOSIS — Z743 Need for continuous supervision: Secondary | ICD-10-CM | POA: Diagnosis not present

## 2019-11-27 DIAGNOSIS — Z87891 Personal history of nicotine dependence: Secondary | ICD-10-CM | POA: Diagnosis not present

## 2019-11-27 DIAGNOSIS — U071 COVID-19: Secondary | ICD-10-CM | POA: Diagnosis present

## 2019-11-27 DIAGNOSIS — Z8249 Family history of ischemic heart disease and other diseases of the circulatory system: Secondary | ICD-10-CM | POA: Diagnosis not present

## 2019-11-27 DIAGNOSIS — H409 Unspecified glaucoma: Secondary | ICD-10-CM | POA: Diagnosis present

## 2019-11-27 DIAGNOSIS — Z79899 Other long term (current) drug therapy: Secondary | ICD-10-CM | POA: Diagnosis not present

## 2019-11-27 DIAGNOSIS — R531 Weakness: Secondary | ICD-10-CM | POA: Diagnosis not present

## 2019-11-27 DIAGNOSIS — R279 Unspecified lack of coordination: Secondary | ICD-10-CM | POA: Diagnosis not present

## 2019-11-27 DIAGNOSIS — E1122 Type 2 diabetes mellitus with diabetic chronic kidney disease: Secondary | ICD-10-CM | POA: Diagnosis present

## 2019-11-27 DIAGNOSIS — R7989 Other specified abnormal findings of blood chemistry: Secondary | ICD-10-CM | POA: Diagnosis present

## 2019-11-27 DIAGNOSIS — E785 Hyperlipidemia, unspecified: Secondary | ICD-10-CM | POA: Diagnosis present

## 2019-11-27 DIAGNOSIS — Z833 Family history of diabetes mellitus: Secondary | ICD-10-CM | POA: Diagnosis not present

## 2019-11-27 DIAGNOSIS — Z7984 Long term (current) use of oral hypoglycemic drugs: Secondary | ICD-10-CM | POA: Diagnosis not present

## 2019-11-27 LAB — COMPREHENSIVE METABOLIC PANEL
ALT: 19 U/L (ref 0–44)
AST: 32 U/L (ref 15–41)
Albumin: 3.9 g/dL (ref 3.5–5.0)
Alkaline Phosphatase: 58 U/L (ref 38–126)
Anion gap: 14 (ref 5–15)
BUN: 18 mg/dL (ref 8–23)
CO2: 22 mmol/L (ref 22–32)
Calcium: 8.7 mg/dL — ABNORMAL LOW (ref 8.9–10.3)
Chloride: 103 mmol/L (ref 98–111)
Creatinine, Ser: 1.27 mg/dL — ABNORMAL HIGH (ref 0.61–1.24)
GFR calc Af Amer: >60 mL/min (ref >60)
GFR calc non Af Amer: 52 mL/min — ABNORMAL LOW (ref >60)
Glucose, Bld: 149 mg/dL — ABNORMAL HIGH (ref 70–99)
Potassium: 3.6 mmol/L (ref 3.5–5.1)
Sodium: 139 mmol/L (ref 135–145)
Total Bilirubin: 0.5 mg/dL (ref 0.3–1.2)
Total Protein: 7.2 g/dL (ref 6.5–8.1)

## 2019-11-27 LAB — CBC WITH DIFFERENTIAL/PLATELET
Abs Immature Granulocytes: 0.03 10*3/uL (ref 0.00–0.07)
Basophils Absolute: 0 10*3/uL (ref 0.0–0.1)
Basophils Relative: 0 %
Eosinophils Absolute: 0 10*3/uL (ref 0.0–0.5)
Eosinophils Relative: 0 %
HCT: 53.1 % — ABNORMAL HIGH (ref 39.0–52.0)
Hemoglobin: 16.6 g/dL (ref 13.0–17.0)
Immature Granulocytes: 0 %
Lymphocytes Relative: 3 %
Lymphs Abs: 0.3 10*3/uL — ABNORMAL LOW (ref 0.7–4.0)
MCH: 26.9 pg (ref 26.0–34.0)
MCHC: 31.3 g/dL (ref 30.0–36.0)
MCV: 86.1 fL (ref 80.0–100.0)
Monocytes Absolute: 0.6 10*3/uL (ref 0.1–1.0)
Monocytes Relative: 8 %
Neutro Abs: 6.7 10*3/uL (ref 1.7–7.7)
Neutrophils Relative %: 89 %
Platelets: 151 10*3/uL (ref 150–400)
RBC: 6.17 MIL/uL — ABNORMAL HIGH (ref 4.22–5.81)
RDW: 15.2 % (ref 11.5–15.5)
WBC: 7.6 10*3/uL (ref 4.0–10.5)
nRBC: 0 % (ref 0.0–0.2)

## 2019-11-27 LAB — CBG MONITORING, ED
Glucose-Capillary: 104 mg/dL — ABNORMAL HIGH (ref 70–99)
Glucose-Capillary: 117 mg/dL — ABNORMAL HIGH (ref 70–99)
Glucose-Capillary: 130 mg/dL — ABNORMAL HIGH (ref 70–99)
Glucose-Capillary: 142 mg/dL — ABNORMAL HIGH (ref 70–99)
Glucose-Capillary: 189 mg/dL — ABNORMAL HIGH (ref 70–99)

## 2019-11-27 LAB — D-DIMER, QUANTITATIVE: D-Dimer, Quant: 2.35 ug/mL-FEU — ABNORMAL HIGH (ref 0.00–0.50)

## 2019-11-27 LAB — C-REACTIVE PROTEIN: CRP: 12.9 mg/dL — ABNORMAL HIGH (ref <1.0)

## 2019-11-27 LAB — HEMOGLOBIN A1C
Hgb A1c MFr Bld: 6.6 % — ABNORMAL HIGH (ref 4.8–5.6)
Mean Plasma Glucose: 142.72 mg/dL

## 2019-11-27 LAB — ABO/RH: ABO/RH(D): O POS

## 2019-11-27 MED ORDER — LABETALOL HCL 5 MG/ML IV SOLN
10.0000 mg | INTRAVENOUS | Status: DC | PRN
  Administered 2019-11-27: 10 mg via INTRAVENOUS
  Filled 2019-11-27 (×2): qty 4

## 2019-11-27 NOTE — Progress Notes (Signed)
PROGRESS NOTE    Yan Pankratz  OMV:[id number] DOB: Mar 14, 1935 DOA: 11/26/2019 PCP: Seward Carol, MD (Confirm with patient/family/NH records and if not entered, this HAS to be entered at [facility] point of entry. "No PCP" if truly none.)   Brief Narrative:  HPI per Dr. Alcario Drought: Osher Oettinger is a 85 y.o. male with medical history significant of DM2, HTN. Patient diagnosed with COVID on 12/28. Patient presents to ED with worsening generalized weakness.  He has had 3+ weeks of cough, COVID symptoms apparently. Started on medrol dose pak x2 days ago at home. He is having difficulty with ambulating in his home due to generalized weakness. He has to hold onto items to get around the home. He lives at home with his wife, who also has a coronavirus infection. He denies any fever, chest pain, shortness of breath, nausea, vomiting, abdominal pain, diarrhea. He is taken cough medicine for his symptoms, no additional treatments. He does have minimal cough. EMS reports of fever to 101.1. Symptoms are moderate and constant nature.  ED Course: No O2 requirement.  procalcitonin neg, WBC nl, gets tachy with standing and requires 2 person assist.  CRP 5.4, D.Dimer of 0.75.  Subjective: Patient has no acute complaints. Was confused according to RN overnight but ED RN found him A&Ox4. On my exam he seems completely with it and oriented. No concerns at this time. Waiting for PT to see him. Doppler negative. [email or web address]   Assessment & Plan:   Principal Problem:   COVID-19 virus infection Active Problems:   Essential hypertension   DM (diabetes mellitus) type II controlled with renal manifestation (HCC)   Generalized weakness   COVID-19   Principal Problem:   COVID-19 virus infection Active Problems:   Essential hypertension   DM (diabetes mellitus) type II controlled with renal manifestation (HCC)   Generalized weakness    1. COVID-19 virus infection - 1. COVID pathway, start MVI, Zinc, Vit D and  C 2. Remdesivir per pharm 3. Daily labs 4. No O2 requirement at this time 2. Generalized weakness - 1. Due likely to combination of COVID-19 and steroids 2. Will hold steroids at this point since he is not hypoxic. 3. Physical therapy consult 4. Check CK labs 3. DM2 - 1. Hold home PO hypoglycemics 2. Mod scale SSI AC/HS 4. HTN - 1. Continue home BP meds  DVT prophylaxis: Lovenox Code Status: Full Family Communication: No family in room Disposition Plan: TBD Consults called: None Admission status: Place in obs  Objective: Vitals:   11/27/19 1050 11/27/19 1100 11/27/19 1414 11/27/19 1750  BP:  (!) 135/91 (!) 173/106   Pulse:  (!) 103 (!) 105 97  Resp:  20 20 (!) 22  Temp:   97.6 F (36.4 C)   TempSrc:   Oral   SpO2:  95% 97% 97%  Weight: 93 kg     Height: 6\' 1"  (1.854 m)       Intake/Output Summary (Last 24 hours) at 11/27/2019 1822 Last data filed at 11/27/2019 1124 Gross per 24 hour  Intake 907.13 ml  Output 400 ml  Net 507.13 ml   Filed Weights   11/27/19 1050  Weight: 93 kg    Examination:  General exam: Appears calm and comfortable  Respiratory system: Clear to auscultation. Respiratory effort normal. Cardiovascular system: S1 & S2 heard, RRR.  Gastrointestinal system: Abdomen is nondistended, soft and nontender. No organomegaly or masses felt. Normal bowel sounds heard. Central nervous system: Alert and oriented. No focal neurological deficits.  Extremities: Symmetric 5 x 5 power. Skin: No rashes, lesions or ulcers Psychiatry: Judgement and insight appear normal. Mood & affect appropriate.    Data Reviewed: I have personally reviewed following labs and imaging studies  CBC: Recent Labs  Lab 11/26/19 1753 11/27/19 0619  WBC 5.7 7.6  NEUTROABS 4.8 6.7  HGB 16.0 16.6  HCT 49.8 53.1*  MCV 84.7 86.1  PLT 172 151   Basic Metabolic Panel: Recent Labs  Lab 11/26/19 1753 11/27/19 0619  NA 139 139  K 3.6 3.6  CL 105 103  CO2 22 22  GLUCOSE 124*  149*  BUN 20 18  CREATININE 1.39* 1.27*  CALCIUM 8.6* 8.7*   GFR: Estimated Creatinine Clearance: 50.7 mL/min (A) (by C-G formula based on SCr of 1.27 mg/dL (H)). Liver Function Tests: Recent Labs  Lab 11/26/19 1753 11/27/19 0619  AST 26 32  ALT 20 19  ALKPHOS 58 58  BILITOT 0.6 0.5  PROT 7.2 7.2  ALBUMIN 3.7 3.9   No results for input(s): LIPASE, AMYLASE in the last 168 hours. No results for input(s): AMMONIA in the last 168 hours. Coagulation Profile: No results for input(s): INR, PROTIME in the last 168 hours. Cardiac Enzymes: No results for input(s): CKTOTAL, CKMB, CKMBINDEX, TROPONINI in the last 168 hours. BNP (last 3 results) No results for input(s): PROBNP in the last 8760 hours. HbA1C: Recent Labs    11/27/19 0006  HGBA1C 6.6*   CBG: Recent Labs  Lab 11/27/19 0004 11/27/19 0736 11/27/19 1412 11/27/19 1720  GLUCAP 117* 104* 142* 130*   Lipid Profile: Recent Labs    11/26/19 1753  TRIG 88   Thyroid Function Tests: No results for input(s): TSH, T4TOTAL, FREET4, T3FREE, THYROIDAB in the last 72 hours. Anemia Panel: Recent Labs    11/26/19 1753  FERRITIN 70   Sepsis Labs: Recent Labs  Lab 11/26/19 1753 11/26/19 2058  PROCALCITON <0.10  --   LATICACIDVEN 1.1 0.9    Recent Results (from the past 240 hour(s))  Novel Coronavirus, NAA (Hosp order, Send-out to Ref Lab; TAT 18-24 hrs     Status: Abnormal   Collection Time: 11/19/19  6:41 PM   Specimen: Nasopharyngeal Swab; Respiratory  Result Value Ref Range Status   SARS-CoV-2, NAA DETECTED (A) NOT DETECTED Final    Comment: (NOTE)                  Client Requested Flag This nucleic acid amplification test was developed and its performance characteristics determined by [employer]. Nucleic acid amplification tests include PCR and TMA. This test has not been FDA cleared or approved. This test has been authorized by FDA under an Emergency Use Authorization (EUA). This test is  only authorized for the duration of time the declaration that circumstances exist justifying the authorization of the emergency use of in vitro diagnostic tests for detection of SARS-CoV-2 virus and/or diagnosis of COVID-19 infection under section 564(b)(1) of the Act, 21 U.S.C. 562ZHY-8(M) (1), unless the authorization is terminated or revoked sooner. When diagnostic testing is negative, the possibility of a false negative result should be considered in the context of a patient's recent exposures and the presence of clinical signs and symptoms consistent with COVID-19. An individual without symptoms of COVID-  19 and who is not shedding SARS-CoV-2 virus would expect to have a negative (not detected) result in this assay. Performed At: [facility] [address] [place], [place] [id number] Jolene Schimke MD BM:[id number]    Coronavirus Source NASOPHARYNGEAL  Final    Comment: Performed at [facility], [address] [place], [place] [address]         Radiology Studies: DG Chest Port 1 View  Result Date: 11/26/2019 CLINICAL DATA:  Weakness COVID positivity, initial encounter EXAM: PORTABLE CHEST 1 VIEW COMPARISON:  05/25/2018 FINDINGS: The heart size and mediastinal contours are within normal limits. Both lungs are clear. The visualized skeletal structures are unremarkable. IMPRESSION: No active disease. Electronically Signed   By: Alcide Clever M.D.   On: 11/26/2019 18:16   VAS [place] LOWER EXTREMITY VENOUS (DVT)  Result Date: 11/27/2019  Lower Venous Study Indications: Swelling, and Elevated Ddimer.  Risk Factors: COVID 19 positive. Comparison Study: No prior studies. Performing Technologist: Chanda Busing RVT  Examination Guidelines: A complete evaluation includes B-mode imaging, spectral Doppler, color Doppler, and power Doppler as needed of all accessible portions of each vessel. Bilateral testing is considered an integral part of a complete examination. Limited  examinations for reoccurring indications may be performed as noted.  +---------+---------------+---------+-----------+----------+--------------+ RIGHT    CompressibilityPhasicitySpontaneityPropertiesThrombus Aging +---------+---------------+---------+-----------+----------+--------------+ CFV      Full           Yes      Yes                                 +---------+---------------+---------+-----------+----------+--------------+ SFJ      Full                                                        +---------+---------------+---------+-----------+----------+--------------+ FV Prox  Full                                                        +---------+---------------+---------+-----------+----------+--------------+ FV Mid   Full                                                        +---------+---------------+---------+-----------+----------+--------------+ FV DistalFull                                                        +---------+---------------+---------+-----------+----------+--------------+ PFV      Full                                                        +---------+---------------+---------+-----------+----------+--------------+ POP      Full           Yes      Yes                                 +---------+---------------+---------+-----------+----------+--------------+  PTV      Full                                                        +---------+---------------+---------+-----------+----------+--------------+ PERO     Full                                                        +---------+---------------+---------+-----------+----------+--------------+   +---------+---------------+---------+-----------+----------+--------------+ LEFT     CompressibilityPhasicitySpontaneityPropertiesThrombus Aging +---------+---------------+---------+-----------+----------+--------------+ CFV      Full           Yes      Yes                                  +---------+---------------+---------+-----------+----------+--------------+ SFJ      Full                                                        +---------+---------------+---------+-----------+----------+--------------+ FV Prox  Full                                                        +---------+---------------+---------+-----------+----------+--------------+ FV Mid   Full                                                        +---------+---------------+---------+-----------+----------+--------------+ FV DistalFull                                                        +---------+---------------+---------+-----------+----------+--------------+ PFV      Full                                                        +---------+---------------+---------+-----------+----------+--------------+ POP      Full           Yes      Yes                                 +---------+---------------+---------+-----------+----------+--------------+ PTV      Full                                                        +---------+---------------+---------+-----------+----------+--------------+  PERO     Full                                                        +---------+---------------+---------+-----------+----------+--------------+     Summary: Right: There is no evidence of deep vein thrombosis in the lower extremity. No cystic structure found in the popliteal fossa. Left: There is no evidence of deep vein thrombosis in the lower extremity. No cystic structure found in the popliteal fossa.  *See table(s) above for measurements and observations. Electronically signed by Fabienne Bruns MD on 11/27/2019 at 4:47:06 PM.    Final       Scheduled Meds: . albuterol  2 puff Inhalation Q6H  . amLODipine  5 mg Oral Daily  . aspirin EC  81 mg Oral Daily  . brimonidine  1 drop Both Eyes Daily  . dorzolamide-timolol  1 drop Both Eyes BID  . enoxaparin  (LOVENOX) injection  40 mg Subcutaneous QHS  . ezetimibe  10 mg Oral Daily  . furosemide  20 mg Oral BID  . insulin aspart  0-15 Units Subcutaneous TID WC  . insulin aspart  0-5 Units Subcutaneous QHS  . latanoprost  1 drop Both Eyes QHS  . trandolapril  4 mg Oral Daily   Continuous Infusions: . remdesivir 100 mg in NS 100 mL Stopped (11/27/19 1124)     LOS: 1 day    Time spent: 30 minutes  Zettie Cooley, MD [facility]  If 7PM-7AM, please contact night-coverage [email or web address] Password [facility] 11/27/2019, 6:22 PM

## 2019-11-27 NOTE — ED Notes (Signed)
ED TO INPATIENT HANDOFF REPORT  Name/Age/Gender Steven Myers 85 y.o. male  Code Status    Code Status Orders  (From admission, onward)         Start     Ordered   11/26/19 2251  Full code  Continuous     11/26/19 2252        Code Status History    This patient has a current code status but no historical code status.   Advance Care Planning Activity      Home/SNF/Other Home  Chief Complaint COVID-19 virus infection [U07.1] COVID-19 [U07.1]  Level of Care/Admitting Diagnosis ED Disposition    ED Disposition Condition Comment   Admit  Hospital Area: [facility] [[id number]]  Level of Care: Med-Surg [[id number]]  Covid Evaluation: Confirmed COVID Positive  Diagnosis: COVID-19 [[id number]]  Admitting Physician: Hillary Bow [[id number]]  Attending Physician: Zettie Cooley [[id number]]  Estimated length of stay: past midnight tomorrow  Certification:: I certify this patient will need inpatient services for at least 2 midnights       Medical History Past Medical History:  Diagnosis Date  . Bilateral cataracts   . CKD (chronic kidney disease) stage 3, GFR 30-59 ml/min   . DM (diabetes mellitus) type II controlled with renal manifestation (HCC)    Not on insulin  . Essential hypertension   . Glaucoma    Optho: Dr. Cathey Endow  . History of colonoscopy   . Hyperlipidemia associated with type 2 diabetes mellitus (HCC)    Intolerant to statins --> because of elevated LFTs.  . Obesity due to excess calories without serious comorbidity    BMI ~32 - mostly truncal obesity (mesomorphic)  . Osteoarthritis of hip    Right > Left -- limits walking  . RBBB (right bundle branch block)   . Venous stasis dermatitis of both lower extremities - with edema    L leg worse than R 2/2 prior injury.; controlled somewhat with support hose    Allergies No Known Allergies  IV Location/Drains/Wounds Patient Lines/Drains/Airways Status   Active Line/Drains/Airways    Name:    Placement date:   Placement time:   Site:   Days:   Peripheral IV 11/26/19 Right Antecubital   11/26/19    1818    Antecubital   1          Labs/Imaging Results for orders placed or performed during the hospital encounter of 11/26/19 (from the past 48 hour(s))  Lactic acid, plasma     Status: None   Collection Time: 11/26/19  5:53 PM  Result Value Ref Range   Lactic Acid, Venous 1.1 0.5 - 1.9 mmol/L    Comment: Performed at [facility], [address], [place], [place] [address]  CBC WITH DIFFERENTIAL     Status: Abnormal   Collection Time: 11/26/19  5:53 PM  Result Value Ref Range   WBC 5.7 4.0 - 10.5 K/uL   RBC 5.88 (H) 4.22 - 5.81 MIL/uL   Hemoglobin 16.0 13.0 - 17.0 g/dL   HCT 43.1 54.0 - 08.6 %   MCV 84.7 80.0 - 100.0 fL   MCH 27.2 26.0 - 34.0 pg   MCHC 32.1 30.0 - 36.0 g/dL   RDW 76.1 95.0 - 93.2 %   Platelets 172 150 - 400 K/uL   nRBC 0.0 0.0 - 0.2 %   Neutrophils Relative % 85 %   Neutro Abs 4.8 1.7 - 7.7 K/uL   Lymphocytes Relative 5 %  Lymphs Abs 0.3 (L) 0.7 - 4.0 K/uL   Monocytes Relative 10 %   Monocytes Absolute 0.6 0.1 - 1.0 K/uL   Eosinophils Relative 0 %   Eosinophils Absolute 0.0 0.0 - 0.5 K/uL   Basophils Relative 0 %   Basophils Absolute 0.0 0.0 - 0.1 K/uL   Immature Granulocytes 0 %   Abs Immature Granulocytes 0.01 0.00 - 0.07 K/uL    Comment: Performed at [facility], [address] [place], [place] [address]  Comprehensive metabolic panel     Status: Abnormal   Collection Time: 11/26/19  5:53 PM  Result Value Ref Range   Sodium 139 135 - 145 mmol/L   Potassium 3.6 3.5 - 5.1 mmol/L   Chloride 105 98 - 111 mmol/L   CO2 22 22 - 32 mmol/L   Glucose, Bld 124 (H) 70 - 99 mg/dL   BUN 20 8 - 23 mg/dL   Creatinine, Ser 5.36 (H) 0.61 - 1.24 mg/dL   Calcium 8.6 (L) 8.9 - 10.3 mg/dL   Total Protein 7.2 6.5 - 8.1 g/dL   Albumin 3.7 3.5 - 5.0 g/dL   AST 26 15 - 41 U/L   ALT 20 0 - 44 U/L   Alkaline Phosphatase 58  38 - 126 U/L   Total Bilirubin 0.6 0.3 - 1.2 mg/dL   GFR calc non Af Amer 47 (L) >60 mL/min   GFR calc Af Amer 54 (L) >60 mL/min   Anion gap 12 5 - 15    Comment: Performed at [facility], [address] [place], [place] [address]  D-dimer, quantitative     Status: Abnormal   Collection Time: 11/26/19  5:53 PM  Result Value Ref Range   D-Dimer, Quant 0.75 (H) 0.00 - 0.50 ug/mL-FEU    Comment: (NOTE) At the manufacturer cut-off of 0.50 ug/mL FEU, this assay has been documented to exclude PE with a sensitivity and negative predictive value of 97 to 99%.  At this time, this assay has not been approved by the FDA to exclude DVT/VTE. Results should be correlated with clinical presentation. Performed at [facility], [address], [place], [place] [address]   Procalcitonin     Status: None   Collection Time: 11/26/19  5:53 PM  Result Value Ref Range   Procalcitonin <0.10 ng/mL    Comment:        Interpretation: PCT (Procalcitonin) <= 0.5 ng/mL: Systemic infection (sepsis) is not likely. Local bacterial infection is possible. (NOTE)       Sepsis PCT Algorithm           Lower Respiratory Tract                                      Infection PCT Algorithm    ----------------------------     ----------------------------         PCT < 0.25 ng/mL                PCT < 0.10 ng/mL         Strongly encourage             Strongly discourage   discontinuation of antibiotics    initiation of antibiotics    ----------------------------     -----------------------------       PCT 0.25 - 0.50 ng/mL  PCT 0.10 - 0.25 ng/mL               OR       >80% decrease in PCT            Discourage initiation of                                            antibiotics      Encourage discontinuation           of antibiotics    ----------------------------     -----------------------------         PCT >= 0.50 ng/mL              PCT 0.26 - 0.50 ng/mL                AND        <80% decrease in PCT             Encourage initiation of                                             antibiotics       Encourage continuation           of antibiotics    ----------------------------     -----------------------------        PCT >= 0.50 ng/mL                  PCT > 0.50 ng/mL               AND         increase in PCT                  Strongly encourage                                      initiation of antibiotics    Strongly encourage escalation           of antibiotics                                     -----------------------------                                           PCT <= 0.25 ng/mL                                                 OR                                        > 80% decrease in PCT  Discontinue / Do not initiate                                             antibiotics Performed at [facility], [address], [place], [place] [address]   Lactate dehydrogenase     Status: Abnormal   Collection Time: 11/26/19  5:53 PM  Result Value Ref Range   LDH 195 (H) 98 - 192 U/L    Comment: Performed at [facility], [address] [place], [place] [address]  Ferritin     Status: None   Collection Time: 11/26/19  5:53 PM  Result Value Ref Range   Ferritin 70 24 - 336 ng/mL    Comment: Performed at [facility], [address], [place], [place] [address]  Triglycerides     Status: None   Collection Time: 11/26/19  5:53 PM  Result Value Ref Range   Triglycerides 88 <150 mg/dL    Comment: Performed at [facility], [address], [place], [place] [address]  Fibrinogen     Status: Abnormal   Collection Time: 11/26/19  5:53 PM  Result Value Ref Range   Fibrinogen 669 (H) 210 - 475 mg/dL    Comment: Performed at [facility], [address], [place], [place] [address]  C-reactive protein     Status: Abnormal    Collection Time: 11/26/19  5:53 PM  Result Value Ref Range   CRP 5.4 (H) <1.0 mg/dL    Comment: Performed at [facility], [address], [place], [place] [address]  Lactic acid, plasma     Status: None   Collection Time: 11/26/19  8:58 PM  Result Value Ref Range   Lactic Acid, Venous 0.9 0.5 - 1.9 mmol/L    Comment: Performed at [facility], [address], [place], [place] [address]  Urinalysis, Routine w reflex microscopic     Status: Abnormal   Collection Time: 11/26/19  8:58 PM  Result Value Ref Range   Color, Urine YELLOW YELLOW   APPearance CLEAR CLEAR   Specific Gravity, Urine 1.012 1.005 - 1.030   pH 7.0 5.0 - 8.0   Glucose, UA NEGATIVE NEGATIVE mg/dL   Hgb urine dipstick NEGATIVE NEGATIVE   Bilirubin Urine NEGATIVE NEGATIVE   Ketones, ur 5 (A) NEGATIVE mg/dL   Protein, ur 962 (A) NEGATIVE mg/dL   Nitrite NEGATIVE NEGATIVE   Leukocytes,Ua NEGATIVE NEGATIVE   RBC / HPF 0-5 0 - 5 RBC/hpf   WBC, UA 0-5 0 - 5 WBC/hpf   Bacteria, UA RARE (A) NONE SEEN    Comment: Performed at [facility], [address], [place], [place] [address]  CBG monitoring, ED     Status: Abnormal   Collection Time: 11/27/19 12:04 AM  Result Value Ref Range   Glucose-Capillary 117 (H) 70 - 99 mg/dL  Hemoglobin X3K     Status: Abnormal   Collection Time: 11/27/19 12:06 AM  Result Value Ref Range   Hgb A1c MFr Bld 6.6 (H) 4.8 - 5.6 %    Comment: (NOTE) Pre diabetes:          5.7%-6.4% Diabetes:              >6.4% Glycemic control for   <7.0% adults with diabetes    Mean Plasma Glucose 142.72  mg/dL    Comment: Performed at [facility], [address], [place], [place] [address]  ABO/Rh     Status: None   Collection Time: 11/27/19 12:06 AM  Result Value Ref Range   ABO/RH(D)      O POS Performed at [facility], [address] [place], [place] [address]   CBC with Differential/Platelet     Status: Abnormal    Collection Time: 11/27/19  6:19 AM  Result Value Ref Range   WBC 7.6 4.0 - 10.5 K/uL   RBC 6.17 (H) 4.22 - 5.81 MIL/uL   Hemoglobin 16.6 13.0 - 17.0 g/dL   HCT 09.8 (H) 11.9 - 14.7 %   MCV 86.1 80.0 - 100.0 fL   MCH 26.9 26.0 - 34.0 pg   MCHC 31.3 30.0 - 36.0 g/dL   RDW 82.9 56.2 - 13.0 %   Platelets 151 150 - 400 K/uL    Comment: PLATELET COUNT CONFIRMED BY SMEAR SPECIMEN CHECKED FOR CLOTS    nRBC 0.0 0.0 - 0.2 %   Neutrophils Relative % 89 %   Neutro Abs 6.7 1.7 - 7.7 K/uL   Lymphocytes Relative 3 %   Lymphs Abs 0.3 (L) 0.7 - 4.0 K/uL   Monocytes Relative 8 %   Monocytes Absolute 0.6 0.1 - 1.0 K/uL   Eosinophils Relative 0 %   Eosinophils Absolute 0.0 0.0 - 0.5 K/uL   Basophils Relative 0 %   Basophils Absolute 0.0 0.0 - 0.1 K/uL   Immature Granulocytes 0 %   Abs Immature Granulocytes 0.03 0.00 - 0.07 K/uL    Comment: Performed at [facility], [address], [place], [place] [address]  Comprehensive metabolic panel     Status: Abnormal   Collection Time: 11/27/19  6:19 AM  Result Value Ref Range   Sodium 139 135 - 145 mmol/L   Potassium 3.6 3.5 - 5.1 mmol/L   Chloride 103 98 - 111 mmol/L   CO2 22 22 - 32 mmol/L   Glucose, Bld 149 (H) 70 - 99 mg/dL   BUN 18 8 - 23 mg/dL   Creatinine, Ser 4.69 (H) 0.61 - 1.24 mg/dL   Calcium 8.7 (L) 8.9 - 10.3 mg/dL   Total Protein 7.2 6.5 - 8.1 g/dL   Albumin 3.9 3.5 - 5.0 g/dL   AST 32 15 - 41 U/L   ALT 19 0 - 44 U/L   Alkaline Phosphatase 58 38 - 126 U/L   Total Bilirubin 0.5 0.3 - 1.2 mg/dL   GFR calc non Af Amer 52 (L) >60 mL/min   GFR calc Af Amer >60 >60 mL/min   Anion gap 14 5 - 15    Comment: Performed at [facility], [address], [place], [place] [address]  C-reactive protein     Status: Abnormal   Collection Time: 11/27/19  6:19 AM  Result Value Ref Range   CRP 12.9 (H) <1.0 mg/dL    Comment: Performed at [facility], [address], [place], [place]  [address]  D-dimer, quantitative (not at [facility])     Status: Abnormal   Collection Time: 11/27/19  6:19 AM  Result Value Ref Range   D-Dimer, Quant 2.35 (H) 0.00 - 0.50 ug/mL-FEU    Comment: (NOTE) At the manufacturer cut-off of 0.50 ug/mL FEU, this assay has been documented to exclude PE with a sensitivity and negative predictive value of 97 to 99%.  At this time, this assay  has not been approved by the FDA to exclude DVT/VTE. Results should be correlated with clinical presentation. Performed at [facility], [address] [place], [place] [address]   POC CBG, ED     Status: Abnormal   Collection Time: 11/27/19  7:36 AM  Result Value Ref Range   Glucose-Capillary 104 (H) 70 - 99 mg/dL  CBG monitoring, ED     Status: Abnormal   Collection Time: 11/27/19  2:12 PM  Result Value Ref Range   Glucose-Capillary 142 (H) 70 - 99 mg/dL  CBG monitoring, ED     Status: Abnormal   Collection Time: 11/27/19  5:20 PM  Result Value Ref Range   Glucose-Capillary 130 (H) 70 - 99 mg/dL   Comment 1 Notify RN    Comment 2 Document in Chart   CBG monitoring, ED     Status: Abnormal   Collection Time: 11/27/19  9:54 PM  Result Value Ref Range   Glucose-Capillary 189 (H) 70 - 99 mg/dL   Comment 1 Notify RN    DG Chest Port 1 View  Result Date: 11/26/2019 CLINICAL DATA:  Weakness COVID positivity, initial encounter EXAM: PORTABLE CHEST 1 VIEW COMPARISON:  05/25/2018 FINDINGS: The heart size and mediastinal contours are within normal limits. Both lungs are clear. The visualized skeletal structures are unremarkable. IMPRESSION: No active disease. Electronically Signed   By: Inez Catalina M.D.   On: 11/26/2019 18:16   VAS [place] LOWER EXTREMITY VENOUS (DVT)  Result Date: 11/27/2019  Lower Venous Study Indications: Swelling, and Elevated Ddimer.  Risk Factors: COVID 19 positive. Comparison Study: No prior studies. Performing Technologist: Oliver Hum RVT  Examination Guidelines: A complete  evaluation includes B-mode imaging, spectral Doppler, color Doppler, and power Doppler as needed of all accessible portions of each vessel. Bilateral testing is considered an integral part of a complete examination. Limited examinations for reoccurring indications may be performed as noted.  +---------+---------------+---------+-----------+----------+--------------+ RIGHT    CompressibilityPhasicitySpontaneityPropertiesThrombus Aging +---------+---------------+---------+-----------+----------+--------------+ CFV      Full           Yes      Yes                                 +---------+---------------+---------+-----------+----------+--------------+ SFJ      Full                                                        +---------+---------------+---------+-----------+----------+--------------+ FV Prox  Full                                                        +---------+---------------+---------+-----------+----------+--------------+ FV Mid   Full                                                        +---------+---------------+---------+-----------+----------+--------------+ FV DistalFull                                                        +---------+---------------+---------+-----------+----------+--------------+  PFV      Full                                                        +---------+---------------+---------+-----------+----------+--------------+ POP      Full           Yes      Yes                                 +---------+---------------+---------+-----------+----------+--------------+ PTV      Full                                                        +---------+---------------+---------+-----------+----------+--------------+ PERO     Full                                                        +---------+---------------+---------+-----------+----------+--------------+    +---------+---------------+---------+-----------+----------+--------------+ LEFT     CompressibilityPhasicitySpontaneityPropertiesThrombus Aging +---------+---------------+---------+-----------+----------+--------------+ CFV      Full           Yes      Yes                                 +---------+---------------+---------+-----------+----------+--------------+ SFJ      Full                                                        +---------+---------------+---------+-----------+----------+--------------+ FV Prox  Full                                                        +---------+---------------+---------+-----------+----------+--------------+ FV Mid   Full                                                        +---------+---------------+---------+-----------+----------+--------------+ FV DistalFull                                                        +---------+---------------+---------+-----------+----------+--------------+ PFV      Full                                                        +---------+---------------+---------+-----------+----------+--------------+  POP      Full           Yes      Yes                                 +---------+---------------+---------+-----------+----------+--------------+ PTV      Full                                                        +---------+---------------+---------+-----------+----------+--------------+ PERO     Full                                                        +---------+---------------+---------+-----------+----------+--------------+     Summary: Right: There is no evidence of deep vein thrombosis in the lower extremity. No cystic structure found in the popliteal fossa. Left: There is no evidence of deep vein thrombosis in the lower extremity. No cystic structure found in the popliteal fossa.  *See table(s) above for measurements and observations. Electronically signed by Fabienne Bruns MD on 11/27/2019 at 4:47:06 PM.    Final     Pending Labs Unresulted Labs (From admission, onward)    Start     Ordered   11/27/19 0723  CK  Add-on,   AD     11/27/19 0723   11/27/19 0500  CBC with Differential/Platelet  Daily,   R     11/26/19 2252   11/27/19 0500  Comprehensive metabolic panel  Daily,   R     11/26/19 2252   11/27/19 0500  C-reactive protein  Daily,   R     11/26/19 2252   11/27/19 0500  D-dimer, quantitative (not at [facility])  Daily,   R     11/26/19 2252   11/26/19 1753  Blood Culture (routine x 2)  BLOOD CULTURE X 2,   STAT     11/26/19 1754          Vitals/Pain Today's Vitals   11/27/19 1749 11/27/19 1750 11/27/19 2157 11/27/19 2200  BP:   (!) 160/111 (!) 151/106  Pulse:  97 (!) 129 (!) 127  Resp:  (!) 22 (!) 22 (!) 22  Temp:      TempSrc:      SpO2:  97% 97% 99%  Weight:      Height:      PainSc: 0-No pain       Isolation Precautions Airborne and Contact precautions  Medications Medications  amLODipine (NORVASC) tablet 5 mg (5 mg Oral Given 11/27/19 1110)  aspirin EC tablet 81 mg (81 mg Oral Given 11/27/19 1110)  latanoprost (XALATAN) 0.005 % ophthalmic solution 1 drop (0 drops Both Eyes Hold 11/27/19 2224)  dorzolamide-timolol (COSOPT) 22.3-6.8 MG/ML ophthalmic solution 1 drop (1 drop Both Eyes Given 11/27/19 2223)  ezetimibe (ZETIA) tablet 10 mg (10 mg Oral Given 11/27/19 1057)  furosemide (LASIX) tablet 20 mg (20 mg Oral Given 11/27/19 1746)  trandolapril (MAVIK) tablet 4 mg (4 mg Oral Given 11/27/19 1056)  brimonidine (ALPHAGAN) 0.15 % ophthalmic solution 1 drop (1 drop Both Eyes Given 11/27/19 1056)  benzonatate (TESSALON) capsule 100-200  mg (has no administration in time range)  albuterol (VENTOLIN HFA) 108 (90 Base) MCG/ACT inhaler 2 puff (has no administration in time range)  insulin aspart (novoLOG) injection 0-15 Units (2 Units Subcutaneous Given 11/27/19 1746)  insulin aspart (novoLOG) injection 0-5 Units (0 Units Subcutaneous Not Given 11/27/19  2202)  enoxaparin (LOVENOX) injection 40 mg (0 mg Subcutaneous Hold 11/27/19 2203)  remdesivir 200 mg in sodium chloride 0.9% 250 mL IVPB ( Intravenous Stopped 11/27/19 0151)    Followed by  remdesivir 100 mg in sodium chloride 0.9 % 100 mL IVPB (0 mg Intravenous Stopped 11/27/19 1124)  albuterol (VENTOLIN HFA) 108 (90 Base) MCG/ACT inhaler 2 puff (2 puffs Inhalation Given 11/27/19 2026)  acetaminophen (TYLENOL) tablet 650 mg (650 mg Oral Given 11/27/19 0633)  ondansetron (ZOFRAN) tablet 4 mg (has no administration in time range)    Or  ondansetron (ZOFRAN) injection 4 mg (has no administration in time range)  labetalol (NORMODYNE) injection 10-20 mg (10 mg Intravenous Given 11/27/19 0634)  acetaminophen (TYLENOL) tablet 650 mg (650 mg Oral Given 11/26/19 1820)  sodium chloride 0.9 % bolus 500 mL (0 mLs Intravenous Stopped 11/26/19 2000)    Mobility walks

## 2019-11-27 NOTE — Progress Notes (Signed)
Bilateral lower extremity venous duplex has been completed. Preliminary results can be found in CV Proc through chart review.  Results were given to the patient's nurse, Lequita Halt.  11/27/19 3:22 PM Olen Cordial RVT

## 2019-11-27 NOTE — ED Notes (Signed)
Blair MD at bedside 

## 2019-11-27 NOTE — ED Notes (Signed)
This Clinical research associate spoke with Dr. Carlena Sax.

## 2019-11-27 NOTE — ED Notes (Signed)
Pt urinated on self; bed linen and gown changed. Pt repositioned. Pt eating cheese, crackers, and drinking ice water at present. NAD. Unlabored breathing. Call light within reach.

## 2019-11-28 LAB — CBC WITH DIFFERENTIAL/PLATELET
Abs Immature Granulocytes: 0.03 10*3/uL (ref 0.00–0.07)
Basophils Absolute: 0 10*3/uL (ref 0.0–0.1)
Basophils Relative: 0 %
Eosinophils Absolute: 0 10*3/uL (ref 0.0–0.5)
Eosinophils Relative: 0 %
HCT: 50.3 % (ref 39.0–52.0)
Hemoglobin: 16 g/dL (ref 13.0–17.0)
Immature Granulocytes: 1 %
Lymphocytes Relative: 8 %
Lymphs Abs: 0.5 10*3/uL — ABNORMAL LOW (ref 0.7–4.0)
MCH: 26.9 pg (ref 26.0–34.0)
MCHC: 31.8 g/dL (ref 30.0–36.0)
MCV: 84.7 fL (ref 80.0–100.0)
Monocytes Absolute: 0.6 10*3/uL (ref 0.1–1.0)
Monocytes Relative: 10 %
Neutro Abs: 4.6 10*3/uL (ref 1.7–7.7)
Neutrophils Relative %: 81 %
Platelets: 153 10*3/uL (ref 150–400)
RBC: 5.94 MIL/uL — ABNORMAL HIGH (ref 4.22–5.81)
RDW: 15.5 % (ref 11.5–15.5)
WBC: 5.6 10*3/uL (ref 4.0–10.5)
nRBC: 0 % (ref 0.0–0.2)

## 2019-11-28 LAB — COMPREHENSIVE METABOLIC PANEL
ALT: 22 U/L (ref 0–44)
AST: 35 U/L (ref 15–41)
Albumin: 3.4 g/dL — ABNORMAL LOW (ref 3.5–5.0)
Alkaline Phosphatase: 56 U/L (ref 38–126)
Anion gap: 12 (ref 5–15)
BUN: 24 mg/dL — ABNORMAL HIGH (ref 8–23)
CO2: 22 mmol/L (ref 22–32)
Calcium: 8.6 mg/dL — ABNORMAL LOW (ref 8.9–10.3)
Chloride: 104 mmol/L (ref 98–111)
Creatinine, Ser: 1.43 mg/dL — ABNORMAL HIGH (ref 0.61–1.24)
GFR calc Af Amer: 52 mL/min — ABNORMAL LOW (ref >60)
GFR calc non Af Amer: 45 mL/min — ABNORMAL LOW (ref >60)
Glucose, Bld: 126 mg/dL — ABNORMAL HIGH (ref 70–99)
Potassium: 3.4 mmol/L — ABNORMAL LOW (ref 3.5–5.1)
Sodium: 138 mmol/L (ref 135–145)
Total Bilirubin: 0.4 mg/dL (ref 0.3–1.2)
Total Protein: 6.9 g/dL (ref 6.5–8.1)

## 2019-11-28 LAB — D-DIMER, QUANTITATIVE: D-Dimer, Quant: 1.06 ug/mL-FEU — ABNORMAL HIGH (ref 0.00–0.50)

## 2019-11-28 LAB — GLUCOSE, CAPILLARY
Glucose-Capillary: 100 mg/dL — ABNORMAL HIGH (ref 70–99)
Glucose-Capillary: 125 mg/dL — ABNORMAL HIGH (ref 70–99)
Glucose-Capillary: 152 mg/dL — ABNORMAL HIGH (ref 70–99)
Glucose-Capillary: 107 mg/dL — ABNORMAL HIGH (ref 70–99)

## 2019-11-28 LAB — C-REACTIVE PROTEIN: CRP: 19.1 mg/dL — ABNORMAL HIGH (ref <1.0)

## 2019-11-28 MED ORDER — SODIUM CHLORIDE 0.9 % IV SOLN
INTRAVENOUS | Status: DC | PRN
  Administered 2019-11-28: 250 mL via INTRAVENOUS

## 2019-11-28 MED ORDER — METOPROLOL TARTRATE 25 MG PO TABS
25.0000 mg | ORAL_TABLET | Freq: Once | ORAL | Status: AC
  Administered 2019-11-28: 25 mg via ORAL
  Filled 2019-11-28: qty 1

## 2019-11-28 NOTE — Evaluation (Signed)
Physical Therapy Evaluation Patient Details Name: Steven Myers MRN: [id number] DOB: [birth year] Today's Date: 11/28/2019   History of Present Illness  85 yo male admitted with COVID-diagnosed 12/28, weakness, tachycardia, confusion. Hx of CKD, DM, obesity, RBBB.  Clinical Impression  On eval, pt required Min assist for mobility. He walked ~50 feet around the room. He is unsteady. He tolerated activity well. Will continue to follow and progress activity as tolerated.     Follow Up Recommendations SNF;Home health PT;Supervision/Assistance - 24 hour(depending on progress and assist available at home)    Equipment Recommendations  (continuing to assess-may need a RW but pt may refuse to use it)    Recommendations for Other Services       Precautions / Restrictions Precautions Precautions: Fall Restrictions Weight Bearing Restrictions: No      Mobility  Bed Mobility Overal bed mobility: Independent             General bed mobility comments: oob in recliner  Transfers Overall transfer level: Needs assistance   Transfers: Sit to/from Stand;Stand Pivot Transfers Sit to Stand: Min guard Stand pivot transfers: Min guard       General transfer comment: Close guard for safesty. Mildly unsteady.  Ambulation/Gait Ambulation/Gait assistance: Min assist;Min guard Gait Distance (Feet): 50 Feet Assistive device: None("furniture walking") Gait Pattern/deviations: Step-through pattern     General Gait Details: Unsteady at times. Pt tolerated distance well. No overt LOB. Pt c/o mild lightheadedness.  Stairs            Wheelchair Mobility    Modified Rankin (Stroke Patients Only)       Balance Overall balance assessment: Needs assistance           Standing balance-Leahy Scale: Fair                               Pertinent Vitals/Pain Pain Assessment: No/denies pain    Home Living Family/patient expects to be discharged to:: Private  residence Living Arrangements: Spouse/significant other   Type of Home: House         Home Equipment: None      Prior Function Level of Independence: Independent         Comments: pt HOH or did not understand OT's questions. Per chart pt lives with wife.     Hand Dominance        Extremity/Trunk Assessment   Upper Extremity Assessment Upper Extremity Assessment: Defer to OT evaluation    Lower Extremity Assessment Lower Extremity Assessment: Generalized weakness    Cervical / Trunk Assessment Cervical / Trunk Assessment: Normal  Communication   Communication: HOH  Cognition Arousal/Alertness: Awake/alert Behavior During Therapy: WFL for tasks assessed/performed Overall Cognitive Status: No family/caregiver present to determine baseline cognitive functioning                                 General Comments: pt unsure why he was in the hospital and was worried about someone selling his house.      General Comments      Exercises     Assessment/Plan    PT Assessment Patient needs continued PT services  PT Problem List Decreased strength;Decreased mobility;Decreased balance;Decreased knowledge of use of DME;Decreased activity tolerance;Decreased cognition;Decreased safety awareness       PT Treatment Interventions DME instruction;Gait training;Therapeutic activities;Therapeutic exercise;Patient/family education;Balance training;Functional mobility training    PT Goals (  Current goals can be found in the Care Plan section)  Acute Rehab PT Goals Patient Stated Goal: did not state PT Goal Formulation: With patient Time For Goal Achievement: 12/12/19 Potential to Achieve Goals: Good    Frequency Min 3X/week   Barriers to discharge        Co-evaluation               AM-PAC PT "6 Clicks" Mobility  Outcome Measure Help needed turning from your back to your side while in a flat bed without using bedrails?: A Little Help needed  moving from lying on your back to sitting on the side of a flat bed without using bedrails?: A Little Help needed moving to and from a bed to a chair (including a wheelchair)?: A Little Help needed standing up from a chair using your arms (e.g., wheelchair or bedside chair)?: A Little Help needed to walk in hospital room?: A Little Help needed climbing 3-5 steps with a railing? : A Little 6 Click Score: 18    End of Session   Activity Tolerance: Patient tolerated treatment well Patient left: in chair;with call bell/phone within reach;with nursing/sitter in room   PT Visit Diagnosis: Unsteadiness on feet (R26.81);Muscle weakness (generalized) (M62.81);Difficulty in walking, not elsewhere classified (R26.2)    Time: 7048-8891 PT Time Calculation (min) (ACUTE ONLY): 8 min   Charges:   PT Evaluation $PT Eval Moderate Complexity: 1 Mod            Kelisha Dall P, PT Acute Rehabilitation

## 2019-11-28 NOTE — Progress Notes (Signed)
PROGRESS NOTE    Steven Myers  TDV:[id number] DOB: Oct 26, 1935 DOA: 11/26/2019 PCP: Renford Dills, MD   Brief Narrative:  HPI per Dr. Julian Reil: Steven Myers is a 85 y.o. male with medical history significant of DM2, HTN. Patient diagnosed with COVID on 12/28. Patient presents to ED with worsening generalized weakness.  He has had 3+ weeks of cough, COVID symptoms apparently. Started on medrol dose pak x2 days ago at home. He is having difficulty with ambulating in his home due to generalized weakness. He has to hold onto items to get around the home. He lives at home with his wife, who also has a coronavirus infection. He denies any fever, chest pain, shortness of breath, nausea, vomiting, abdominal pain, diarrhea. He is taken cough medicine for his symptoms, no additional treatments. He does have minimal cough. EMS reports of fever to 101.1. Symptoms are moderate and constant nature.  ED Course: No O2 requirement.  procalcitonin neg, WBC nl, gets tachy with standing and requires 2 person assist.  CRP 5.4, D.Dimer of 0.75.  Subjective: Patient has no acute complaints. According to nursing care he was confused overnight, has a sitter at bedside. Talked with wife, he does get confused at nighttime. Is oriented and back to normal upon my interview. Waiting on PT to eval patient.   Assessment & Plan:   Principal Problem:   COVID-19 virus infection Active Problems:   Essential hypertension   DM (diabetes mellitus) type II controlled with renal manifestation (HCC)   Generalized weakness   COVID-19   Principal Problem:   COVID-19 virus infection Active Problems:   Essential hypertension   DM (diabetes mellitus) type II controlled with renal manifestation (HCC)   Generalized weakness    1. COVID-19 virus infection - 1. COVID pathway, start MVI, Zinc, Vit D and C 2. Remdesivir per pharm 3. Daily labs 4. No O2 requirement at this time 2. Generalized weakness - 1. Due likely to  combination of COVID-19 and steroids 2. Will hold steroids at this point since he is not hypoxic. 3. Physical therapy consult 4. Check CK labs 3. DM2 - 1. Hold home PO hypoglycemics 2. Mod scale SSI AC/HS 4. HTN - 1. Continue home BP meds  DVT prophylaxis: Lovenox Code Status: Full Family Communication: No family in room Disposition Plan: TBD Consults called: None Admission status: Change to inpatient given need for remdesivir  Objective: Vitals:   11/28/19 0105 11/28/19 0323 11/28/19 0502 11/28/19 1302  BP: (!) 166/108 (!) 155/92 136/87 139/89  Pulse: 76 80 80 91  Resp: 20  20 18   Temp: 98.2 F (36.8 C)  98.7 F (37.1 C) 97.9 F (36.6 C)  TempSrc: Oral  Oral Oral  SpO2: 92% 96% 100% 96%  Weight:      Height:       No intake or output data in the 24 hours ending 11/28/19 1306 Filed Weights   11/27/19 1050  Weight: 93 kg    Examination:  General exam: Appears calm and comfortable  Respiratory system: Clear to auscultation. Respiratory effort normal. Cardiovascular system: S1 & S2 heard, RRR.  Gastrointestinal system: Abdomen is nondistended, soft and nontender. No organomegaly or masses felt. Normal bowel sounds heard. Central nervous system: Alert and oriented. No focal neurological deficits. Extremities: Symmetric 5 x 5 power. Skin: No rashes, lesions or ulcers Psychiatry: Judgement and insight appear normal. Mood & affect appropriate.    Data Reviewed: I have personally reviewed following labs and imaging studies  CBC: Recent Labs  Lab 11/26/19 1753 11/27/19 0619 11/28/19 0301  WBC 5.7 7.6 5.6  NEUTROABS 4.8 6.7 4.6  HGB 16.0 16.6 16.0  HCT 49.8 53.1* 50.3  MCV 84.7 86.1 84.7  PLT 172 151 153   Basic Metabolic Panel: Recent Labs  Lab 11/26/19 1753 11/27/19 0619 11/28/19 0301  NA 139 139 138  K 3.6 3.6 3.4*  CL 105 103 104  CO2 22 22 22   GLUCOSE 124* 149* 126*  BUN 20 18 24*  CREATININE 1.39* 1.27* 1.43*  CALCIUM 8.6* 8.7* 8.6*    GFR: Estimated Creatinine Clearance: 45 mL/min (A) (by C-G formula based on SCr of 1.43 mg/dL (H)). Liver Function Tests: Recent Labs  Lab 11/26/19 1753 11/27/19 0619 11/28/19 0301  AST 26 32 35  ALT 20 19 22   ALKPHOS 58 58 56  BILITOT 0.6 0.5 0.4  PROT 7.2 7.2 6.9  ALBUMIN 3.7 3.9 3.4*   No results for input(s): LIPASE, AMYLASE in the last 168 hours. No results for input(s): AMMONIA in the last 168 hours. Coagulation Profile: No results for input(s): INR, PROTIME in the last 168 hours. Cardiac Enzymes: No results for input(s): CKTOTAL, CKMB, CKMBINDEX, TROPONINI in the last 168 hours. BNP (last 3 results) No results for input(s): PROBNP in the last 8760 hours. HbA1C: Recent Labs    11/27/19 0006  HGBA1C 6.6*   CBG: Recent Labs  Lab 11/27/19 1412 11/27/19 1720 11/27/19 2154 11/28/19 0758 11/28/19 1232  GLUCAP 142* 130* 189* 100* 125*   Lipid Profile: Recent Labs    11/26/19 1753  TRIG 88   Thyroid Function Tests: No results for input(s): TSH, T4TOTAL, FREET4, T3FREE, THYROIDAB in the last 72 hours. Anemia Panel: Recent Labs    11/26/19 1753  FERRITIN 70   Sepsis Labs: Recent Labs  Lab 11/26/19 1753 11/26/19 2058  PROCALCITON <0.10  --   LATICACIDVEN 1.1 0.9    Recent Results (from the past 240 hour(s))  Novel Coronavirus, NAA (Hosp order, Send-out to Ref Lab; TAT 18-24 hrs     Status: Abnormal   Collection Time: 11/19/19  6:41 PM   Specimen: Nasopharyngeal Swab; Respiratory  Result Value Ref Range Status   SARS-CoV-2, NAA DETECTED (A) NOT DETECTED Final    Comment: (NOTE)                  Client Requested Flag This nucleic acid amplification test was developed and its performance characteristics determined by 2059. Nucleic acid amplification tests include PCR and TMA. This test has not been FDA cleared or approved. This test has been authorized by FDA under an Emergency Use Authorization (EUA). This test is only authorized  for the duration of time the declaration that circumstances exist justifying the authorization of the emergency use of in vitro diagnostic tests for detection of SARS-CoV-2 virus and/or diagnosis of COVID-19 infection under section 564(b)(1) of the Act, 21 U.S.C. 11/21/19) (1), unless the authorization is terminated or revoked sooner. When diagnostic testing is negative, the possibility of a false negative result should be considered in the context of a patient's recent exposures and the presence of clinical signs and symptoms consistent with COVID-19. An individual without symptoms of COVID-  19 and who is not shedding SARS-CoV-2 virus would expect to have a negative (not detected) result in this assay. Performed At: [facility] [address] [place], [address] [place] [id number]    Coronavirus Source NASOPHARYNGEAL  Final    Comment: Performed at [facility],  [address], [place], [place] [address]  Blood Culture (routine x 2)     Status: None (Preliminary result)   Collection Time: 11/26/19  6:22 PM   Specimen: BLOOD  Result Value Ref Range Status   Specimen Description   Final    BLOOD RIGHT ANTECUBITAL Performed at [place] [address], [place], [place] [address]    Special Requests   Final    BOTTLES DRAWN AEROBIC AND ANAEROBIC Blood Culture adequate volume Performed at [facility] [address], [place], [place] [address]    Culture   Final    NO GROWTH 1 DAY Performed at [facility], [place] [address] [place], [place] [address]    Report Status PENDING  Incomplete  Blood Culture (routine x 2)     Status: None (Preliminary result)   Collection Time: 11/26/19  6:37 PM   Specimen: BLOOD  Result Value Ref Range Status   Specimen Description   Final    BLOOD LEFT ANTECUBITAL Performed at [place] [address] [place], [place] [address]    Special  Requests   Final    BOTTLES DRAWN AEROBIC AND ANAEROBIC Blood Culture adequate volume Performed at [facility] [address], [place], [place] [address]    Culture   Final    NO GROWTH 1 DAY Performed at [facility], [place] [address] [place], [place] [address]    Report Status PENDING  Incomplete         Radiology Studies: DG Chest Port 1 View  Result Date: 11/26/2019 CLINICAL DATA:  Weakness COVID positivity, initial encounter EXAM: PORTABLE CHEST 1 VIEW COMPARISON:  05/25/2018 FINDINGS: The heart size and mediastinal contours are within normal limits. Both lungs are clear. The visualized skeletal structures are unremarkable. IMPRESSION: No active disease. Electronically Signed   By: Inez Catalina M.D.   On: 11/26/2019 18:16   VAS [place] LOWER EXTREMITY VENOUS (DVT)  Result Date: 11/27/2019  Lower Venous Study Indications: Swelling, and Elevated Ddimer.  Risk Factors: COVID 19 positive. Comparison Study: No prior studies. Performing Technologist: Oliver Hum RVT  Examination Guidelines: A complete evaluation includes B-mode imaging, spectral Doppler, color Doppler, and power Doppler as needed of all accessible portions of each vessel. Bilateral testing is considered an integral part of a complete examination. Limited examinations for reoccurring indications may be performed as noted.  +---------+---------------+---------+-----------+----------+--------------+ RIGHT    CompressibilityPhasicitySpontaneityPropertiesThrombus Aging +---------+---------------+---------+-----------+----------+--------------+ CFV      Full           Yes      Yes                                 +---------+---------------+---------+-----------+----------+--------------+ SFJ      Full                                                        +---------+---------------+---------+-----------+----------+--------------+ FV Prox  Full                                                         +---------+---------------+---------+-----------+----------+--------------+  FV Mid   Full                                                        +---------+---------------+---------+-----------+----------+--------------+ FV DistalFull                                                        +---------+---------------+---------+-----------+----------+--------------+ PFV      Full                                                        +---------+---------------+---------+-----------+----------+--------------+ POP      Full           Yes      Yes                                 +---------+---------------+---------+-----------+----------+--------------+ PTV      Full                                                        +---------+---------------+---------+-----------+----------+--------------+ PERO     Full                                                        +---------+---------------+---------+-----------+----------+--------------+   +---------+---------------+---------+-----------+----------+--------------+ LEFT     CompressibilityPhasicitySpontaneityPropertiesThrombus Aging +---------+---------------+---------+-----------+----------+--------------+ CFV      Full           Yes      Yes                                 +---------+---------------+---------+-----------+----------+--------------+ SFJ      Full                                                        +---------+---------------+---------+-----------+----------+--------------+ FV Prox  Full                                                        +---------+---------------+---------+-----------+----------+--------------+ FV Mid   Full                                                        +---------+---------------+---------+-----------+----------+--------------+  FV DistalFull                                                         +---------+---------------+---------+-----------+----------+--------------+ PFV      Full                                                        +---------+---------------+---------+-----------+----------+--------------+ POP      Full           Yes      Yes                                 +---------+---------------+---------+-----------+----------+--------------+ PTV      Full                                                        +---------+---------------+---------+-----------+----------+--------------+ PERO     Full                                                        +---------+---------------+---------+-----------+----------+--------------+     Summary: Right: There is no evidence of deep vein thrombosis in the lower extremity. No cystic structure found in the popliteal fossa. Left: There is no evidence of deep vein thrombosis in the lower extremity. No cystic structure found in the popliteal fossa.  *See table(s) above for measurements and observations. Electronically signed by Fabienne Bruns MD on 11/27/2019 at 4:47:06 PM.    Final       Scheduled Meds: . albuterol  2 puff Inhalation Q6H  . amLODipine  5 mg Oral Daily  . aspirin EC  81 mg Oral Daily  . brimonidine  1 drop Both Eyes Daily  . dorzolamide-timolol  1 drop Both Eyes BID  . enoxaparin (LOVENOX) injection  40 mg Subcutaneous QHS  . ezetimibe  10 mg Oral Daily  . furosemide  20 mg Oral BID  . insulin aspart  0-15 Units Subcutaneous TID WC  . insulin aspart  0-5 Units Subcutaneous QHS  . latanoprost  1 drop Both Eyes QHS  . trandolapril  4 mg Oral Daily   Continuous Infusions: . sodium chloride 250 mL (11/28/19 1016)  . remdesivir 100 mg in NS 100 mL 100 mg (11/28/19 1017)     LOS: 2 days    Time spent: 20 minutes  Zettie Cooley, MD [facility]  If 7PM-7AM, please contact night-coverage [email or web address] Password [email or web address] 11/28/2019, 1:06 PM

## 2019-11-28 NOTE — Evaluation (Signed)
Occupational Therapy Evaluation Patient Details Name: Steven Myers MRN: [id number] DOB: [birth year] Today's Date: 11/28/2019    History of Present Illness Steven Myers is a 85 y.o. male with medical history significant of DM2, HTN.Patient diagnosed with COVID on 12/28.Patient presents to ED with worsening generalized weakness.  He has had 3+ weeks of cough, COVID symptoms apparently.Started on medrol dose pak x2 days ago at home.He is having difficulty with ambulating in his home due to generalized weakness. He has to hold onto items to get around the home. He lives at home with his wife, who also has a coronavirus infection.   Clinical Impression   Pt admitted with COVID. Pt currently with functional limitations due to the deficits listed below (see OT Problem List).  Pt will benefit from skilled OT to increase their safety and independence with ADL and functional mobility for ADL to facilitate discharge to venue listed below.   Pts wife is also sick at home and not able to currently care or pt at this time.  If wife gets well then pt could go home but may SNF if doesn't have care at home     Follow Up Recommendations  Supervision/Assistance - 24 hour    Equipment Recommendations  None recommended by OT    Recommendations for Other Services       Precautions / Restrictions Precautions Precautions: Fall      Mobility Bed Mobility Overal bed mobility: Independent                Transfers Overall transfer level: Needs assistance   Transfers: Sit to/from Stand;Stand Pivot Transfers Sit to Stand: Min guard Stand pivot transfers: Min guard            Balance Overall balance assessment: Mild deficits observed, not formally tested                                         ADL either performed or assessed with clinical judgement   ADL Overall ADL's : Needs assistance/impaired Eating/Feeding: Set up;Sitting   Grooming: Set up;Sitting   Upper Body  Bathing: Set up;Sitting   Lower Body Bathing: Minimal assistance;Cueing for safety;Cueing for compensatory techniques   Upper Body Dressing : Set up;Sitting   Lower Body Dressing: Minimal assistance;Sit to/from stand;Cueing for safety;Cueing for sequencing   Toilet Transfer: Minimal assistance;Stand-pivot;Cueing for safety;Cueing for sequencing Toilet Transfer Details (indicate cue type and reason): bed to chair           General ADL Comments: pt overall S - min A with ADLactivity. Pt does have a sitter this day due to some confusion     Vision Patient Visual Report: No change from baseline              Pertinent Vitals/Pain Pain Assessment: No/denies pain        Extremity/Trunk Assessment Upper Extremity Assessment Upper Extremity Assessment: Generalized weakness           Communication Communication Communication: HOH   Cognition Arousal/Alertness: Awake/alert Behavior During Therapy: WFL for tasks assessed/performed Overall Cognitive Status: No family/caregiver present to determine baseline cognitive functioning                                 General Comments: pt unsure why he was in the hospital and was worried about someone selling his  house.   General Comments               Home Living Family/patient expects to be discharged to:: Private residence Living Arrangements: Spouse/significant other                                      Prior Functioning/Environment          Comments: pt HOH or did not understand OT's questions. Per chart pt lives with wife.        OT Problem List: Decreased activity tolerance;Decreased strength      OT Treatment/Interventions: Self-care/ADL training;Energy conservation;Patient/family education;Therapeutic activities    OT Goals(Current goals can be found in the care plan section) Acute Rehab OT Goals Patient Stated Goal: did not state OT Goal Formulation: With patient Time For  Goal Achievement: 11/28/19 Potential to Achieve Goals: Good  OT Frequency: Min 2X/week    AM-PAC OT "6 Clicks" Daily Activity     Outcome Measure Help from another person eating meals?: None Help from another person taking care of personal grooming?: A Little Help from another person toileting, which includes using toliet, bedpan, or urinal?: A Little Help from another person bathing (including washing, rinsing, drying)?: A Little Help from another person to put on and taking off regular upper body clothing?: A Little Help from another person to put on and taking off regular lower body clothing?: A Little 6 Click Score: 19   End of Session Nurse Communication: Mobility status  Activity Tolerance: Patient tolerated treatment well Patient left: in chair;with call bell/phone within reach;with nursing/sitter in room  OT Visit Diagnosis: Unsteadiness on feet (R26.81);Muscle weakness (generalized) (M62.81)                Time: 4680-3212 OT Time Calculation (min): 17 min Charges:  OT General Charges $OT Visit: 1 Visit OT Evaluation $OT Eval Moderate Complexity: 1 Mod  Lise Auer, OT Acute Rehabilitation Services [phone] Office- [phone]     Steven Myers, Steven Myers 11/28/2019, 3:05 PM

## 2019-11-29 LAB — COMPREHENSIVE METABOLIC PANEL
ALT: 26 U/L (ref 0–44)
AST: 43 U/L — ABNORMAL HIGH (ref 15–41)
Albumin: 3.1 g/dL — ABNORMAL LOW (ref 3.5–5.0)
Alkaline Phosphatase: 48 U/L (ref 38–126)
Anion gap: 13 (ref 5–15)
BUN: 26 mg/dL — ABNORMAL HIGH (ref 8–23)
CO2: 22 mmol/L (ref 22–32)
Calcium: 8.4 mg/dL — ABNORMAL LOW (ref 8.9–10.3)
Chloride: 107 mmol/L (ref 98–111)
Creatinine, Ser: 1.34 mg/dL — ABNORMAL HIGH (ref 0.61–1.24)
GFR calc Af Amer: 57 mL/min — ABNORMAL LOW (ref >60)
GFR calc non Af Amer: 49 mL/min — ABNORMAL LOW (ref >60)
Glucose, Bld: 86 mg/dL (ref 70–99)
Potassium: 3.2 mmol/L — ABNORMAL LOW (ref 3.5–5.1)
Sodium: 142 mmol/L (ref 135–145)
Total Bilirubin: 0.7 mg/dL (ref 0.3–1.2)
Total Protein: 6.4 g/dL — ABNORMAL LOW (ref 6.5–8.1)

## 2019-11-29 LAB — CBC WITH DIFFERENTIAL/PLATELET
Abs Immature Granulocytes: 0.03 10*3/uL (ref 0.00–0.07)
Basophils Absolute: 0 10*3/uL (ref 0.0–0.1)
Basophils Relative: 1 %
Eosinophils Absolute: 0 10*3/uL (ref 0.0–0.5)
Eosinophils Relative: 1 %
HCT: 48.3 % (ref 39.0–52.0)
Hemoglobin: 15.4 g/dL (ref 13.0–17.0)
Immature Granulocytes: 1 %
Lymphocytes Relative: 18 %
Lymphs Abs: 0.7 10*3/uL (ref 0.7–4.0)
MCH: 26.7 pg (ref 26.0–34.0)
MCHC: 31.9 g/dL (ref 30.0–36.0)
MCV: 83.9 fL (ref 80.0–100.0)
Monocytes Absolute: 0.5 10*3/uL (ref 0.1–1.0)
Monocytes Relative: 12 %
Neutro Abs: 2.8 10*3/uL (ref 1.7–7.7)
Neutrophils Relative %: 67 %
Platelets: 177 10*3/uL (ref 150–400)
RBC: 5.76 MIL/uL (ref 4.22–5.81)
RDW: 14.9 % (ref 11.5–15.5)
WBC: 4 10*3/uL (ref 4.0–10.5)
nRBC: 0 % (ref 0.0–0.2)

## 2019-11-29 LAB — C-REACTIVE PROTEIN: CRP: 11.8 mg/dL — ABNORMAL HIGH (ref <1.0)

## 2019-11-29 LAB — GLUCOSE, CAPILLARY
Glucose-Capillary: 83 mg/dL (ref 70–99)
Glucose-Capillary: 101 mg/dL — ABNORMAL HIGH (ref 70–99)
Glucose-Capillary: 140 mg/dL — ABNORMAL HIGH (ref 70–99)
Glucose-Capillary: 163 mg/dL — ABNORMAL HIGH (ref 70–99)

## 2019-11-29 LAB — D-DIMER, QUANTITATIVE: D-Dimer, Quant: 0.99 ug/mL-FEU — ABNORMAL HIGH (ref 0.00–0.50)

## 2019-11-29 MED ORDER — POTASSIUM CHLORIDE 20 MEQ PO PACK
40.0000 meq | PACK | Freq: Once | ORAL | Status: AC
  Administered 2019-11-29: 40 meq via ORAL
  Filled 2019-11-29: qty 2

## 2019-11-29 MED ORDER — MELATONIN 5 MG PO TABS
5.0000 mg | ORAL_TABLET | Freq: Every day | ORAL | Status: DC
  Administered 2019-11-29 – 2019-11-30 (×2): 5 mg via ORAL
  Filled 2019-11-29 (×2): qty 1

## 2019-11-29 NOTE — Progress Notes (Signed)
PROGRESS NOTE    Steven Myers  RUE:[id number] DOB: 05-Dec-1934 DOA: 11/26/2019 PCP: Steven DillsPolite, Ronald, MD   Brief Narrative:  HPI per Steven Myers: Steven BeckmannWillie Myers is a [age] y.o. male with medical history significant of DM2, HTN. Patient diagnosed with COVID on 12/28. Patient presents to ED with worsening generalized weakness.  He has had 3+ weeks of cough, COVID symptoms apparently. Started on medrol dose pak x2 days ago at home. He is having difficulty with ambulating in his home due to generalized weakness. He has to hold onto items to get around the home. He lives at home with his wife, who also has a coronavirus infection. He denies any fever, chest pain, shortness of breath, nausea, vomiting, abdominal pain, diarrhea. He is taken cough medicine for his symptoms, no additional treatments. He does have minimal cough. EMS reports of fever to 101.1. Symptoms are moderate and constant nature.  ED Course: No O2 requirement.  procalcitonin neg, WBC nl, gets tachy with standing and requires 2 person assist.  CRP 5.4, D.Dimer of 0.75. 1/4 Started remdesivir, no steroids given no respiratory requirement, has been intermittently confused  Subjective: Patient has no acute complaints. He thinks he is doing quite well. Have spoke with wife twice now, and she states she is unable to physically assist him at home at this time. Will work on his weakness while here. Has been confused at night time. Otherwise no acute changes  Assessment & Plan:   Principal Problem:   COVID-19 virus infection Active Problems:   Essential hypertension   DM (diabetes mellitus) type II controlled with renal manifestation (HCC)   Generalized weakness   COVID-19   Principal Problem:   COVID-19 virus infection Active Problems:   Essential hypertension   DM (diabetes mellitus) type II controlled with renal manifestation (HCC)   Generalized weakness    1. COVID-19 virus infection - 1. COVID pathway, start MVI, Zinc,  Vit D and C 2. Remdesivir per pharm 3. Daily labs 4. No O2 requirement at this time 2. Generalized weakness - 1. Due likely to combination of COVID-19 and steroids 2. Will hold steroids at this point since he is not hypoxic. 3. Physical therapy consult 4. May require SNF 3. DM2 - 1. Hold home PO hypoglycemics 2. Mod scale SSI AC/HS 4. HTN - 1. Continue home BP meds  DVT prophylaxis: Lovenox Code Status: Full Family Communication: No family in room Disposition Plan: TBD, may need SNF Consults called: None Admission status: Change to inpatient given need for remdesivir  Objective: Vitals:   11/28/19 1503 11/28/19 2050 11/29/19 0608 11/29/19 1238  BP:  139/90 (!) 133/99 133/90  Pulse:  87 (!) 103 94  Resp:  20 17 17   Temp:  98.1 F (36.7 C) 98.7 F (37.1 C) 98.9 F (37.2 C)  TempSrc:  Oral  Oral  SpO2: 96% 93% 94% 95%  Weight:      Height:        Intake/Output Summary (Last 24 hours) at 11/29/2019 1254 Last data filed at 11/29/2019 0600 Gross per 24 hour  Intake 338 ml  Output 450 ml  Net -112 ml   Filed Weights   11/27/19 1050  Weight: 93 kg    Examination:  General exam: Appears calm and comfortable, answers all questions appropriately  Respiratory system: Clear to auscultation. Respiratory effort normal. Cardiovascular system: S1 & S2 heard, RRR.  Gastrointestinal system: Abdomen is nondistended, soft and nontender. No organomegaly or masses felt. Normal bowel sounds heard. Central nervous  system: Alert and orientedx3. No focal neurological deficits. Extremities: Symmetric 5 x 5 power. Skin: No rashes, lesions or ulcers Psychiatry: Judgement and insight appear normal. Mood & affect appropriate.    Data Reviewed: I have personally reviewed following labs and imaging studies  CBC: Recent Labs  Lab 11/26/19 1753 11/27/19 0619 11/28/19 0301 11/29/19 0246  WBC 5.7 7.6 5.6 4.0  NEUTROABS 4.8 6.7 4.6 2.8  HGB 16.0 16.6 16.0 15.4  HCT 49.8 53.1* 50.3  48.3  MCV 84.7 86.1 84.7 83.9  PLT 172 151 153 177   Basic Metabolic Panel: Recent Labs  Lab 11/26/19 1753 11/27/19 0619 11/28/19 0301 11/29/19 0246  NA 139 139 138 142  K 3.6 3.6 3.4* 3.2*  CL 105 103 104 107  CO2 22 22 22 22   GLUCOSE 124* 149* 126* 86  BUN 20 18 24* 26*  CREATININE 1.39* 1.27* 1.43* 1.34*  CALCIUM 8.6* 8.7* 8.6* 8.4*   GFR: Estimated Creatinine Clearance: 48 mL/min (A) (by C-G formula based on SCr of 1.34 mg/dL (H)). Liver Function Tests: Recent Labs  Lab 11/26/19 1753 11/27/19 0619 11/28/19 0301 11/29/19 0246  AST 26 32 35 43*  ALT 20 19 22 26   ALKPHOS 58 58 56 48  BILITOT 0.6 0.5 0.4 0.7  PROT 7.2 7.2 6.9 6.4*  ALBUMIN 3.7 3.9 3.4* 3.1*   No results for input(s): LIPASE, AMYLASE in the last 168 hours. No results for input(s): AMMONIA in the last 168 hours. Coagulation Profile: No results for input(s): INR, PROTIME in the last 168 hours. Cardiac Enzymes: No results for input(s): CKTOTAL, CKMB, CKMBINDEX, TROPONINI in the last 168 hours. BNP (last 3 results) No results for input(s): PROBNP in the last 8760 hours. HbA1C: Recent Labs    11/27/19 0006  HGBA1C 6.6*   CBG: Recent Labs  Lab 11/28/19 1232 11/28/19 1710 11/28/19 2021 11/29/19 0802 11/29/19 1236  GLUCAP 125* 152* 107* 83 101*   Lipid Profile: Recent Labs    11/26/19 1753  TRIG 88   Thyroid Function Tests: No results for input(s): TSH, T4TOTAL, FREET4, T3FREE, THYROIDAB in the last 72 hours. Anemia Panel: Recent Labs    11/26/19 1753  FERRITIN 70   Sepsis Labs: Recent Labs  Lab 11/26/19 1753 11/26/19 2058  PROCALCITON <0.10  --   LATICACIDVEN 1.1 0.9    Recent Results (from the past 240 hour(s))  Novel Coronavirus, NAA (Hosp order, Send-out to Ref Lab; TAT 18-24 hrs     Status: Abnormal   Collection Time: 11/19/19  6:41 PM   Specimen: Nasopharyngeal Swab; Respiratory  Result Value Ref Range Status   SARS-CoV-2, NAA DETECTED (A) NOT DETECTED Final     Comment: (NOTE)                  Client Requested Flag This nucleic acid amplification test was developed and its performance characteristics determined by 2059. Nucleic acid amplification tests include PCR and TMA. This test has not been FDA cleared or approved. This test has been authorized by FDA under an Emergency Use Authorization (EUA). This test is only authorized for the duration of time the declaration that circumstances exist justifying the authorization of the emergency use of in vitro diagnostic tests for detection of SARS-CoV-2 virus and/or diagnosis of COVID-19 infection under section 564(b)(1) of the Act, 21 U.S.C. 11/21/19) (1), unless the authorization is terminated or revoked sooner. When diagnostic testing is negative, the possibility of a false negative result should be considered in the context of  a patient's recent exposures and the presence of clinical signs and symptoms consistent with COVID-19. An individual without symptoms of COVID-  19 and who is not shedding SARS-CoV-2 virus would expect to have a negative (not detected) result in this assay. Performed At: [facility] [address] [place], [place] [id number] Jolene Schimke MD KZ:[id number]    Coronavirus Source NASOPHARYNGEAL  Final    Comment: Performed at [facility], [address], [place], [place] [address]  Blood Culture (routine x 2)     Status: None (Preliminary result)   Collection Time: 11/26/19  6:22 PM   Specimen: BLOOD  Result Value Ref Range Status   Specimen Description   Final    BLOOD RIGHT ANTECUBITAL Performed at [facility], [address] [place], [place] [address]    Special Requests   Final    BOTTLES DRAWN AEROBIC AND ANAEROBIC Blood Culture adequate volume Performed at [facility], [address] [place], [place] [address]    Culture   Final    NO GROWTH 1 DAY Performed at [facility],  [address], [place], [place] [address]    Report Status PENDING  Incomplete  Blood Culture (routine x 2)     Status: None (Preliminary result)   Collection Time: 11/26/19  6:37 PM   Specimen: BLOOD  Result Value Ref Range Status   Specimen Description   Final    BLOOD LEFT ANTECUBITAL Performed at [facility], [address], [place], [place] [address]    Special Requests   Final    BOTTLES DRAWN AEROBIC AND ANAEROBIC Blood Culture adequate volume Performed at [facility], [address], [place], [place] [address]    Culture   Final    NO GROWTH 1 DAY Performed at [facility], [address], [place], [place] [address]    Report Status PENDING  Incomplete         Radiology Studies: VAS [place] LOWER EXTREMITY VENOUS (DVT)  Result Date: 11/27/2019  Lower Venous Study Indications: Swelling, and Elevated Ddimer.  Risk Factors: COVID 19 positive. Comparison Study: No prior studies. Performing Technologist: Chanda Busing RVT  Examination Guidelines: A complete evaluation includes B-mode imaging, spectral Doppler, color Doppler, and power Doppler as needed of all accessible portions of each vessel. Bilateral testing is considered an integral part of a complete examination. Limited examinations for reoccurring indications may be performed as noted.  +---------+---------------+---------+-----------+----------+--------------+  RIGHT     Compressibility Phasicity Spontaneity Properties Thrombus Aging  +---------+---------------+---------+-----------+----------+--------------+  CFV       Full            Yes       Yes                                    +---------+---------------+---------+-----------+----------+--------------+  SFJ       Full                                                             +---------+---------------+---------+-----------+----------+--------------+  FV Prox   Full                                                              +---------+---------------+---------+-----------+----------+--------------+  FV Mid    Full                                                             +---------+---------------+---------+-----------+----------+--------------+  FV Distal Full                                                             +---------+---------------+---------+-----------+----------+--------------+  PFV       Full                                                             +---------+---------------+---------+-----------+----------+--------------+  POP       Full            Yes       Yes                                    +---------+---------------+---------+-----------+----------+--------------+  PTV       Full                                                             +---------+---------------+---------+-----------+----------+--------------+  PERO      Full                                                             +---------+---------------+---------+-----------+----------+--------------+   +---------+---------------+---------+-----------+----------+--------------+  LEFT      Compressibility Phasicity Spontaneity Properties Thrombus Aging  +---------+---------------+---------+-----------+----------+--------------+  CFV       Full            Yes       Yes                                    +---------+---------------+---------+-----------+----------+--------------+  SFJ       Full                                                             +---------+---------------+---------+-----------+----------+--------------+  FV Prox   Full                                                             +---------+---------------+---------+-----------+----------+--------------+  FV Mid    Full                                                             +---------+---------------+---------+-----------+----------+--------------+  FV Distal Full                                                              +---------+---------------+---------+-----------+----------+--------------+  PFV       Full                                                             +---------+---------------+---------+-----------+----------+--------------+  POP       Full            Yes       Yes                                    +---------+---------------+---------+-----------+----------+--------------+  PTV       Full                                                             +---------+---------------+---------+-----------+----------+--------------+  PERO      Full                                                             +---------+---------------+---------+-----------+----------+--------------+     Summary: Right: There is no evidence of deep vein thrombosis in the lower extremity. No cystic structure found in the popliteal fossa. Left: There is no evidence of deep vein thrombosis in the lower extremity. No cystic structure found in the popliteal fossa.  *See table(s) above for measurements and observations. Electronically signed by Ruta Hinds MD on 11/27/2019 at 4:47:06 PM.    Final       Scheduled Meds:  albuterol  2 puff Inhalation Q6H   amLODipine  5 mg Oral Daily   aspirin EC  81 mg Oral Daily   brimonidine  1 drop Both Eyes Daily   dorzolamide-timolol  1 drop Both Eyes BID   enoxaparin (LOVENOX) injection  40 mg Subcutaneous QHS   ezetimibe  10 mg Oral Daily   furosemide  20 mg Oral BID   insulin aspart  0-15 Units Subcutaneous TID WC   insulin aspart  0-5 Units Subcutaneous QHS   latanoprost  1 drop Both Eyes QHS   trandolapril  4 mg Oral Daily   Continuous Infusions:  sodium chloride 250 mL (11/28/19 1016)  remdesivir 100 mg in NS 100 mL 100 mg (11/29/19 1104)     LOS: 3 days    Time spent: 20 minutes  Zettie Cooley, MD [facility]  If 7PM-7AM, please contact night-coverage [email or web address] Password [facility] 11/29/2019, 12:54 PM

## 2019-11-29 NOTE — TOC Progression Note (Signed)
Transition of Care [facility]) - Progression Note    Patient Details  Name: Steven Myers MRN: [id number] Date of Birth: 1935-08-24  Transition of Care [facility]) CM/SW Contact  Geni Bers, RN Phone Number: 11/29/2019, 3:49 PM  Clinical Narrative:    Spoke with pt's wife concerning disposition to SNF or home. Mrs. Snowball would like to talk it over with her adult children. However Mrs. Luecke gave permission to fax to SNF.         Expected Discharge Plan and Services                                                 Social Determinants of Health (SDOH) Interventions    Readmission Risk Interventions No flowsheet data found.

## 2019-11-29 NOTE — [place] FL2 (Signed)
[place] MEDICAID FL2 LEVEL OF CARE SCREENING TOOL     IDENTIFICATION  Patient Name: Steven Myers Birthdate: [birth year] Sex: male Admission Date (Current Location): 11/26/2019  [facility] and [place] Number:  Herbalist and Address:  [facility],  [place], [place]      Provider Number: [id number]  Attending Physician Name and Address:  Arma Heading, MD  Relative Name and Phone Number:       Current Level of Care: Hospital Recommended Level of Care: [place] Prior Approval Number:    Date Approved/Denied:   PASRR Number:    Discharge Plan: SNF    Current Diagnoses: Patient Active Problem List   Diagnosis Date Noted  . COVID-19 11/27/2019  . Generalized weakness 11/26/2019  . COVID-19 virus infection 11/26/2019  . Essential hypertension   . DM (diabetes mellitus) type II controlled with renal manifestation ([place])   . Hyperlipidemia associated with type 2 diabetes mellitus ([place])   . Venous stasis dermatitis of both lower extremities - with edema   . DOE (dyspnea on exertion) 06/08/2018  . Edema of both lower extremities 06/08/2018  . RBBB 05/05/2010  . Precordial chest pain 05/05/2010  . GASTRITIS 10/14/2009  . FECAL OCCULT BLOOD 09/09/2009    Orientation RESPIRATION BLADDER Height & Weight     Self, Time, Situation, Place  Normal Continent Weight: 93 kg Height:  6\' 1"  (185.4 cm)  BEHAVIORAL SYMPTOMS/MOOD NEUROLOGICAL BOWEL NUTRITION STATUS      Continent Diet(Carb Mod)  AMBULATORY STATUS COMMUNICATION OF NEEDS Skin   Extensive Assist Verbally Normal                       Personal Care Assistance Level of Assistance  Bathing, Feeding, Dressing Bathing Assistance: Limited assistance Feeding assistance: Independent Dressing Assistance: Limited assistance     Functional Limitations Info  Sight, Hearing, Speech Sight Info: Adequate Hearing Info: Adequate Speech Info: Adequate    SPECIAL CARE  FACTORS FREQUENCY  OT (By licensed OT), PT (By licensed PT)     PT Frequency: Eval and Treat OT Frequency: Eval and Treat            Contractures Contractures Info: Not present    Additional Factors Info  Code Status, Allergies Code Status Info: FULL Allergies Info: No Known Allergies           Current Medications (11/29/2019):  This is the current hospital active medication list Current Facility-Administered Medications  Medication Dose Route Frequency Provider Last Rate Last Admin  . 0.9 %  sodium chloride infusion   Intravenous PRN Arma Heading, MD 10 mL/hr at 11/28/19 1016 250 mL at 11/28/19 1016  . acetaminophen (TYLENOL) tablet 650 mg  650 mg Oral Q6H PRN Etta Quill, DO   650 mg at 11/27/19 4650  . albuterol (VENTOLIN HFA) 108 (90 Base) MCG/ACT inhaler 2 puff  2 puff Inhalation Q4H PRN Etta Quill, DO      . albuterol (VENTOLIN HFA) 108 (90 Base) MCG/ACT inhaler 2 puff  2 puff Inhalation Q6H Jennette Kettle M, DO   2 puff at 11/29/19 1351  . amLODipine (NORVASC) tablet 5 mg  5 mg Oral Daily Jennette Kettle M, DO   5 mg at 11/29/19 3546  . aspirin EC tablet 81 mg  81 mg Oral Daily Etta Quill, DO   81 mg at 11/29/19 5681  . benzonatate (TESSALON) capsule 100-200 mg  100-200 mg Oral  TID PRN Hillary Bow, DO      . brimonidine (ALPHAGAN) 0.15 % ophthalmic solution 1 drop  1 drop Both Eyes Daily Hillary Bow, DO   1 drop at 11/29/19 0814  . dorzolamide-timolol (COSOPT) 22.3-6.8 MG/ML ophthalmic solution 1 drop  1 drop Both Eyes BID Hillary Bow, DO   1 drop at 11/29/19 0810  . enoxaparin (LOVENOX) injection 40 mg  40 mg Subcutaneous QHS Lyda Perone M, DO   40 mg at 11/28/19 2051  . ezetimibe (ZETIA) tablet 10 mg  10 mg Oral Daily Lyda Perone M, DO   10 mg at 11/29/19 2595  . furosemide (LASIX) tablet 20 mg  20 mg Oral BID Hillary Bow, DO   20 mg at 11/29/19 0810  . insulin aspart (novoLOG) injection 0-15 Units  0-15 Units Subcutaneous TID  WC Hillary Bow, DO   3 Units at 11/28/19 1727  . insulin aspart (novoLOG) injection 0-5 Units  0-5 Units Subcutaneous QHS Lyda Perone M, DO      . labetalol (NORMODYNE) injection 10-20 mg  10-20 mg Intravenous Q2H PRN Hillary Bow, DO   10 mg at 11/27/19 6387  . latanoprost (XALATAN) 0.005 % ophthalmic solution 1 drop  1 drop Both Eyes QHS Hillary Bow, DO   Stopped at 11/27/19 0326  . Melatonin TABS 5 mg  5 mg Oral QHS Zettie Cooley, MD      . ondansetron [facility]) tablet 4 mg  4 mg Oral Q6H PRN Hillary Bow, DO       Or  . ondansetron [facility]) injection 4 mg  4 mg Intravenous Q6H PRN Hillary Bow, DO      . remdesivir 100 mg in sodium chloride 0.9 % 100 mL IVPB  100 mg Intravenous Daily Lyda Perone M, DO 200 mL/hr at 11/29/19 1104 100 mg at 11/29/19 1104  . trandolapril (MAVIK) tablet 4 mg  4 mg Oral Daily Lyda Perone M, DO   4 mg at 11/29/19 1057     Discharge Medications: Please see discharge summary for a list of discharge medications.  Relevant Imaging Results:  Relevant Lab Results:   Additional Information SS#[id number]  Geni Bers, RN

## 2019-11-29 NOTE — Care Management Important Message (Signed)
Important Message  Patient Details IM Letter given to Ezekiel Ina RN Case Manager to present to the Patient Name: Steven Myers MRN: [id number] Date of Birth: [birth year]   Medicare Important Message Given:  Yes     Caren Macadam 11/29/2019, 12:39 PM

## 2019-11-29 NOTE — Progress Notes (Signed)
Physical Therapy Treatment Patient Details Name: Steven Myers MRN: [id number] DOB: 1934-11-28 Today's Date: 11/29/2019    History of Present Illness 85 yo male admitted with COVID-diagnosed 12/28, weakness, tachycardia, confusion. Hx of CKD, DM, obesity, RBBB.    PT Comments    Progressing with mobility.  Pt remains unsteady but he is not requiring a lot of physical assistance. He participated well.   Follow Up Recommendations  SNF vs Home health PT;Supervision/Assistance - 24 hour(depending on pt/family decision)     Equipment Recommendations  Rolling walker with 5" wheels(if pt will agree to use one)    Recommendations for Other Services       Precautions / Restrictions Precautions Precautions: Fall Restrictions Weight Bearing Restrictions: No    Mobility  Bed Mobility Overal bed mobility: Modified Independent                Transfers Overall transfer level: Needs assistance   Transfers: Sit to/from Stand Sit to Stand: Supervision            Ambulation/Gait Ambulation/Gait assistance: Min guard;Min assist Gait Distance (Feet): 50 Feet Assistive device: None; ("furniture walking") Gait Pattern/deviations: Step-through pattern;Decreased stride length     General Gait Details: Unsteady. Intermittent assist to steady.   Stairs             Wheelchair Mobility    Modified Rankin (Stroke Patients Only)       Balance Overall balance assessment: Needs assistance           Standing balance-Leahy Scale: Fair                              Cognition Arousal/Alertness: Awake/alert Behavior During Therapy: WFL for tasks assessed/performed Overall Cognitive Status: No family/caregiver present to determine baseline cognitive functioning                                        Exercises      General Comments        Pertinent Vitals/Pain Pain Assessment: No/denies pain    Home Living                       Prior Function            PT Goals (current goals can now be found in the care plan section) Progress towards PT goals: Progressing toward goals    Frequency    Min 3X/week      PT Plan Current plan remains appropriate    Co-evaluation              AM-PAC PT "6 Clicks" Mobility   Outcome Measure  Help needed turning from your back to your side while in a flat bed without using bedrails?: None Help needed moving from lying on your back to sitting on the side of a flat bed without using bedrails?: None Help needed moving to and from a bed to a chair (including a wheelchair)?: A Little Help needed standing up from a chair using your arms (e.g., wheelchair or bedside chair)?: A Little Help needed to walk in hospital room?: A Little Help needed climbing 3-5 steps with a railing? : A Little 6 Click Score: 20    End of Session   Activity Tolerance: Patient tolerated treatment well Patient left: in bed(about to be transferred to 5W)  PT Visit Diagnosis: Unsteadiness on feet (R26.81);Muscle weakness (generalized) (M62.81);Difficulty in walking, not elsewhere classified (R26.2)     Time: 1030-1314 PT Time Calculation (min) (ACUTE ONLY): 8 min  Charges:  $Gait Training: 8-22 mins                         Doreatha Massed, PT Acute Rehabilitation

## 2019-11-30 LAB — COMPREHENSIVE METABOLIC PANEL
ALT: 28 U/L (ref 0–44)
AST: 41 U/L (ref 15–41)
Albumin: 3.2 g/dL — ABNORMAL LOW (ref 3.5–5.0)
Alkaline Phosphatase: 51 U/L (ref 38–126)
Anion gap: 11 (ref 5–15)
BUN: 22 mg/dL (ref 8–23)
CO2: 27 mmol/L (ref 22–32)
Calcium: 8.4 mg/dL — ABNORMAL LOW (ref 8.9–10.3)
Chloride: 105 mmol/L (ref 98–111)
Creatinine, Ser: 1.44 mg/dL — ABNORMAL HIGH (ref 0.61–1.24)
GFR calc Af Amer: 52 mL/min — ABNORMAL LOW (ref >60)
GFR calc non Af Amer: 45 mL/min — ABNORMAL LOW (ref >60)
Glucose, Bld: 133 mg/dL — ABNORMAL HIGH (ref 70–99)
Potassium: 4.2 mmol/L (ref 3.5–5.1)
Sodium: 143 mmol/L (ref 135–145)
Total Bilirubin: 1.3 mg/dL — ABNORMAL HIGH (ref 0.3–1.2)
Total Protein: 6.6 g/dL (ref 6.5–8.1)

## 2019-11-30 LAB — CBC WITH DIFFERENTIAL/PLATELET
Abs Immature Granulocytes: 0.05 10*3/uL (ref 0.00–0.07)
Basophils Absolute: 0 10*3/uL (ref 0.0–0.1)
Basophils Relative: 1 %
Eosinophils Absolute: 0.1 10*3/uL (ref 0.0–0.5)
Eosinophils Relative: 1 %
HCT: 48.8 % (ref 39.0–52.0)
Hemoglobin: 15.1 g/dL (ref 13.0–17.0)
Immature Granulocytes: 1 %
Lymphocytes Relative: 18 %
Lymphs Abs: 0.7 10*3/uL (ref 0.7–4.0)
MCH: 26.6 pg (ref 26.0–34.0)
MCHC: 30.9 g/dL (ref 30.0–36.0)
MCV: 86.1 fL (ref 80.0–100.0)
Monocytes Absolute: 0.4 10*3/uL (ref 0.1–1.0)
Monocytes Relative: 11 %
Neutro Abs: 2.5 10*3/uL (ref 1.7–7.7)
Neutrophils Relative %: 68 %
Platelets: 223 10*3/uL (ref 150–400)
RBC: 5.67 MIL/uL (ref 4.22–5.81)
RDW: 15.1 % (ref 11.5–15.5)
WBC: 3.7 10*3/uL — ABNORMAL LOW (ref 4.0–10.5)
nRBC: 0 % (ref 0.0–0.2)

## 2019-11-30 LAB — GLUCOSE, CAPILLARY
Glucose-Capillary: 100 mg/dL — ABNORMAL HIGH (ref 70–99)
Glucose-Capillary: 172 mg/dL — ABNORMAL HIGH (ref 70–99)
Glucose-Capillary: 77 mg/dL (ref 70–99)
Glucose-Capillary: 89 mg/dL (ref 70–99)

## 2019-11-30 LAB — C-REACTIVE PROTEIN: CRP: 8.2 mg/dL — ABNORMAL HIGH (ref <1.0)

## 2019-11-30 LAB — D-DIMER, QUANTITATIVE: D-Dimer, Quant: 0.71 ug/mL-FEU — ABNORMAL HIGH (ref 0.00–0.50)

## 2019-11-30 NOTE — Plan of Care (Signed)
Continue current POC 

## 2019-11-30 NOTE — TOC Progression Note (Signed)
Transition of Care [facility] ) - Progression Note    Patient Details  Name: Steven Myers MRN: [id number] Date of Birth: Jun 02, 1935  Transition of Care [facility]) CM/SW Contact  Geni Bers, RN Phone Number: 11/30/2019, 5:03 PM  Clinical Narrative:    Spoke with pt's wife concerning HH vs SNF. HH was selected. Pt is active with [facility] [phone]. Plans are for pt to discharge home with [facility]. [facility] contract with [facility]. [facility] will start care on Monday 12/04/19 with HHPT.         Expected Discharge Plan and Services                                                 Social Determinants of Health (SDOH) Interventions    Readmission Risk Interventions No flowsheet data found.

## 2019-11-30 NOTE — Progress Notes (Signed)
PROGRESS NOTE    Steven Myers  SHF:[id number] DOB: [birth year] DOA: 11/26/2019 PCP: Renford Dills, MD   Brief Narrative:  HPI per Dr. Julian Reil: Steven Myers is a 85 y.o. male with medical history significant of DM2, HTN. Patient diagnosed with COVID on 12/28. Patient presents to ED with worsening generalized weakness.  He has had 3+ weeks of cough, COVID symptoms apparently. Started on medrol dose pak x2 days ago at home. He is having difficulty with ambulating in his home due to generalized weakness. He has to hold onto items to get around the home. He lives at home with his wife, who also has a coronavirus infection. He denies any fever, chest pain, shortness of breath, nausea, vomiting, abdominal pain, diarrhea. He is taken cough medicine for his symptoms, no additional treatments. He does have minimal cough. EMS reports of fever to 101.1. Symptoms are moderate and constant nature.  ED Course: No O2 requirement.  procalcitonin neg, WBC nl, gets tachy with standing and requires 2 person assist.  CRP 5.4, D.Dimer of 0.75. 1/4 Started remdesivir, no steroids given no respiratory requirement, has been intermittently confused  1/6 No signs of confusion. Sitter discontinued, patient tolerating treatment well, working with PT  Subjective: No acute events. No issues overnight. Working with PT. Disposition of SNF versus HH PT, weakness is improving. Patient overall feels better. Doing better at this time. No complaints today.   Assessment & Plan:   Principal Problem:   COVID-19 virus infection Active Problems:   Essential hypertension   DM (diabetes mellitus) type II controlled with renal manifestation (HCC)   Generalized weakness   COVID-19   Principal Problem:   COVID-19 virus infection Active Problems:   Essential hypertension   DM (diabetes mellitus) type II controlled with renal manifestation (HCC)   Generalized weakness    1. COVID-19 virus infection - 1. COVID pathway,  start MVI, Zinc, Vit D and C 2. Remdesivir per pharm, day 4/5 3. Daily labs 4. No O2 requirement at this time 2. Generalized weakness - 1. Due likely to combination of COVID-19 and steroids 2. Will hold steroids at this point since he is not hypoxic. 3. Physical therapy consult 4. May require SNF, but likely [facility] PT and go home end of week 3. DM2 - 1. Hold home PO hypoglycemics 2. Mod scale SSI AC/HS 4. HTN - 1. Continue home BP meds  DVT prophylaxis: Lovenox Code Status: Full Family Communication: No family in room Disposition Plan: TBD, may need SNF, but hopefully HH tomorrow to home with family support Consults called: None Admission status: Change to inpatient given need for remdesivir  Objective: Vitals:   11/29/19 1238 11/29/19 1527 11/29/19 2121 11/30/19 0548  BP: 133/90 (!) 154/106 (!) 161/85 (!) 129/93  Pulse: 94 96 94 92  Resp: 17 18 18 19   Temp: 98.9 F (37.2 C) 98.6 F (37 C) 98.5 F (36.9 C) 98.1 F (36.7 C)  TempSrc: Oral Oral Oral Oral  SpO2: 95% 95% 94% 91%  Weight:      Height:        Intake/Output Summary (Last 24 hours) at 11/30/2019 1109 Last data filed at 11/30/2019 01/28/2020 Gross per 24 hour  Intake 360 ml  Output 1275 ml  Net -915 ml   Filed Weights   11/27/19 1050  Weight: 93 kg    Examination:  General exam: Appears calm and comfortable, lying in bed without issue Respiratory system: Clear to auscultation. Respiratory effort normal. Cardiovascular system: S1 & S2 heard,  RRR.  Gastrointestinal system: Abdomen is nondistended, soft and nontender. No organomegaly or masses felt. Normal bowel sounds heard. Central nervous system: Alert and orientedx3/3. No focal neurological deficits. Extremities: Symmetric 5 x 5 power. Skin: No rashes, lesions or ulcers Psychiatry: Judgement and insight appear normal. Mood & affect appropriate.    Data Reviewed: I have personally reviewed following labs and imaging studies  CBC: Recent Labs  Lab  11/26/19 1753 11/27/19 0619 11/28/19 0301 11/29/19 0246 11/30/19 0508  WBC 5.7 7.6 5.6 4.0 3.7*  NEUTROABS 4.8 6.7 4.6 2.8 2.5  HGB 16.0 16.6 16.0 15.4 15.1  HCT 49.8 53.1* 50.3 48.3 48.8  MCV 84.7 86.1 84.7 83.9 86.1  PLT 172 151 153 177 223   Basic Metabolic Panel: Recent Labs  Lab 11/26/19 1753 11/27/19 0619 11/28/19 0301 11/29/19 0246 11/30/19 0508  NA 139 139 138 142 143  K 3.6 3.6 3.4* 3.2* 4.2  CL 105 103 104 107 105  CO2 22 22 22 22 27   GLUCOSE 124* 149* 126* 86 133*  BUN 20 18 24* 26* 22  CREATININE 1.39* 1.27* 1.43* 1.34* 1.44*  CALCIUM 8.6* 8.7* 8.6* 8.4* 8.4*   GFR: Estimated Creatinine Clearance: 44.7 mL/min (A) (by C-G formula based on SCr of 1.44 mg/dL (H)). Liver Function Tests: Recent Labs  Lab 11/26/19 1753 11/27/19 0619 11/28/19 0301 11/29/19 0246 11/30/19 0508  AST 26 32 35 43* 41  ALT 20 19 22 26 28   ALKPHOS 58 58 56 48 51  BILITOT 0.6 0.5 0.4 0.7 1.3*  PROT 7.2 7.2 6.9 6.4* 6.6  ALBUMIN 3.7 3.9 3.4* 3.1* 3.2*   No results for input(s): LIPASE, AMYLASE in the last 168 hours. No results for input(s): AMMONIA in the last 168 hours. Coagulation Profile: No results for input(s): INR, PROTIME in the last 168 hours. Cardiac Enzymes: No results for input(s): CKTOTAL, CKMB, CKMBINDEX, TROPONINI in the last 168 hours. BNP (last 3 results) No results for input(s): PROBNP in the last 8760 hours. HbA1C: No results for input(s): HGBA1C in the last 72 hours. CBG: Recent Labs  Lab 11/29/19 0802 11/29/19 1236 11/29/19 1731 11/29/19 2117 11/30/19 0754  GLUCAP 83 101* 140* 163* 100*   Lipid Profile: No results for input(s): CHOL, HDL, LDLCALC, TRIG, CHOLHDL, LDLDIRECT in the last 72 hours. Thyroid Function Tests: No results for input(s): TSH, T4TOTAL, FREET4, T3FREE, THYROIDAB in the last 72 hours. Anemia Panel: No results for input(s): VITAMINB12, FOLATE, FERRITIN, TIBC, IRON, RETICCTPCT in the last 72 hours. Sepsis Labs: Recent Labs  Lab  11/26/19 1753 11/26/19 2058  PROCALCITON <0.10  --   LATICACIDVEN 1.1 0.9    Recent Results (from the past 240 hour(s))  Blood Culture (routine x 2)     Status: None (Preliminary result)   Collection Time: 11/26/19  6:22 PM   Specimen: BLOOD  Result Value Ref Range Status   Specimen Description   Final    BLOOD RIGHT ANTECUBITAL Performed at [facility], [address] [place], [place]    Special Requests   Final    BOTTLES DRAWN AEROBIC AND ANAEROBIC Blood Culture adequate volume Performed at [facility], [address] [place], [place]    Culture   Final    NO GROWTH 2 DAYS Performed at [facility], [address] [place], [address] [place]    Report Status PENDING  Incomplete  Blood Culture (routine x 2)     Status: None (Preliminary result)  Collection Time: 11/26/19  6:37 PM   Specimen: BLOOD  Result Value Ref Range Status   Specimen Description   Final    BLOOD LEFT ANTECUBITAL Performed at [facility] [address] [place], [place] [address]    Special Requests   Final    BOTTLES DRAWN AEROBIC AND ANAEROBIC Blood Culture adequate volume Performed at [facility] [address], [place], [place] [address]    Culture   Final    NO GROWTH 2 DAYS Performed at [facility] [address], [place], [place] [address]    Report Status PENDING  Incomplete         Radiology Studies: No results found.    Scheduled Meds: . albuterol  2 puff Inhalation Q6H  . amLODipine  5 mg Oral Daily  . aspirin EC  81 mg Oral Daily  . brimonidine  1 drop Both Eyes Daily  . dorzolamide-timolol  1 drop Both Eyes BID  . enoxaparin (LOVENOX) injection  40 mg Subcutaneous QHS  . ezetimibe  10 mg Oral Daily  . furosemide  20 mg Oral BID  . insulin aspart  0-15 Units Subcutaneous TID WC  . insulin aspart  0-5 Units Subcutaneous QHS  . latanoprost  1 drop Both  Eyes QHS  . Melatonin  5 mg Oral QHS  . trandolapril  4 mg Oral Daily   Continuous Infusions: . sodium chloride 250 mL (11/28/19 1016)  . remdesivir 100 mg in NS 100 mL 100 mg (11/29/19 1104)     LOS: 4 days   Time spent: 18 minutes  Arma Heading, MD [facility]  If 7PM-7AM, please contact night-coverage [email or web address] Password [facility] 11/30/2019, 11:09 AM

## 2019-12-01 LAB — COMPREHENSIVE METABOLIC PANEL
ALT: 24 U/L (ref 0–44)
AST: 32 U/L (ref 15–41)
Albumin: 3.1 g/dL — ABNORMAL LOW (ref 3.5–5.0)
Alkaline Phosphatase: 52 U/L (ref 38–126)
Anion gap: 11 (ref 5–15)
BUN: 19 mg/dL (ref 8–23)
CO2: 24 mmol/L (ref 22–32)
Calcium: 8.2 mg/dL — ABNORMAL LOW (ref 8.9–10.3)
Chloride: 105 mmol/L (ref 98–111)
Creatinine, Ser: 1.4 mg/dL — ABNORMAL HIGH (ref 0.61–1.24)
GFR calc Af Amer: 54 mL/min — ABNORMAL LOW (ref >60)
GFR calc non Af Amer: 46 mL/min — ABNORMAL LOW (ref >60)
Glucose, Bld: 124 mg/dL — ABNORMAL HIGH (ref 70–99)
Potassium: 3.4 mmol/L — ABNORMAL LOW (ref 3.5–5.1)
Sodium: 140 mmol/L (ref 135–145)
Total Bilirubin: 0.8 mg/dL (ref 0.3–1.2)
Total Protein: 6.4 g/dL — ABNORMAL LOW (ref 6.5–8.1)

## 2019-12-01 LAB — CBC WITH DIFFERENTIAL/PLATELET
Abs Immature Granulocytes: 0.03 10*3/uL (ref 0.00–0.07)
Basophils Absolute: 0 10*3/uL (ref 0.0–0.1)
Basophils Relative: 1 %
Eosinophils Absolute: 0.1 10*3/uL (ref 0.0–0.5)
Eosinophils Relative: 2 %
HCT: 49.6 % (ref 39.0–52.0)
Hemoglobin: 15.6 g/dL (ref 13.0–17.0)
Immature Granulocytes: 1 %
Lymphocytes Relative: 19 %
Lymphs Abs: 0.7 10*3/uL (ref 0.7–4.0)
MCH: 26.7 pg (ref 26.0–34.0)
MCHC: 31.5 g/dL (ref 30.0–36.0)
MCV: 84.9 fL (ref 80.0–100.0)
Monocytes Absolute: 0.4 10*3/uL (ref 0.1–1.0)
Monocytes Relative: 12 %
Neutro Abs: 2.5 10*3/uL (ref 1.7–7.7)
Neutrophils Relative %: 65 %
Platelets: 245 10*3/uL (ref 150–400)
RBC: 5.84 MIL/uL — ABNORMAL HIGH (ref 4.22–5.81)
RDW: 14.9 % (ref 11.5–15.5)
WBC: 3.7 10*3/uL — ABNORMAL LOW (ref 4.0–10.5)
nRBC: 0 % (ref 0.0–0.2)

## 2019-12-01 LAB — D-DIMER, QUANTITATIVE: D-Dimer, Quant: 0.66 ug/mL-FEU — ABNORMAL HIGH (ref 0.00–0.50)

## 2019-12-01 LAB — GLUCOSE, CAPILLARY: Glucose-Capillary: 131 mg/dL — ABNORMAL HIGH (ref 70–99)

## 2019-12-01 LAB — C-REACTIVE PROTEIN: CRP: 7.5 mg/dL — ABNORMAL HIGH (ref <1.0)

## 2019-12-01 MED ORDER — POTASSIUM CHLORIDE 20 MEQ PO PACK
40.0000 meq | PACK | Freq: Once | ORAL | Status: AC
  Administered 2019-12-01: 40 meq via ORAL
  Filled 2019-12-01: qty 2

## 2019-12-01 NOTE — Discharge Summary (Signed)
Physician Discharge Summary  Jarmarcus Wambold RFV:[id number] DOB: 1935/11/14 DOA: 11/26/2019  PCP: Renford Dills, MD  Admit date: 11/26/2019 Discharge date: 12/01/2019  Admitted From: Home  Disposition:  Home   Recommendations for Outpatient Follow-up:  1. Follow up with PCP in 1-2 weeks 2. Please obtain BMP/CBC in one week 3. Continue to evaluate for blood sugars 4. Continue to evaluate for weakness, likely related to Covid 19 infection, has been asymptoamtic otherwise 5. Evaluate for resolution of Covid 19, never required oxygen 6. Please follow up on the following pending results: NONE  Home Health: YES, PT, OT, Nursing Equipment/Devices: ROLLING WALKER   Discharge Condition: STABLE CODE STATUS: FULL  Diet recommendation: CARB MODIFIED  Brief/Interim Summary: HPI per Dr. Julian Reil: Ivar Drape Brooksis a 85 y.o.malewith medical history significant ofDM2, HTN. Patient diagnosed with COVID on 12/28. Patient presents to ED with worsening generalized weakness. He has had 3+ weeks of cough, COVID symptoms apparently. Started on medrol dose pak x2 days ago at home. He is having difficulty with ambulating in his home due to generalized weakness. He has to hold onto items to get around the home. He lives at home with his wife, who also has a coronavirus infection. He denies any fever, chest pain, shortness of breath, nausea, vomiting, abdominal pain, diarrhea. He is taken cough medicine for his symptoms, no additional treatments. He does have minimal cough. EMS reports of fever to 101.1. Symptoms are moderate and constant nature.  ED Course:No O2 requirement. procalcitonin neg, WBC nl, gets tachy with standing and requires 2 person assist. CRP 5.4, D.Dimer of 0.75.  1/4 Started remdesivir, no steroids given no respiratory requirement, has been intermittently confused 1/5 No longer showing signs of confusion, doing well 1/6 No signs of confusion. Sitter discontinued, patient tolerating  treatment well, working with PT 1/7 No signs of oxygen 1/8 Finished remdesivir 5/5, inflammatory markers all down trending, patient asymptomatic  Discharge Diagnoses:  Principal Problem:   COVID-19 virus infection Active Problems:   Essential hypertension   DM (diabetes mellitus) type II controlled with renal manifestation (HCC)   Generalized weakness   COVID-19   Discharge Instructions  Discharge Instructions    Call MD for:  difficulty breathing, headache or visual disturbances   Complete by: As directed    Call MD for:  extreme fatigue   Complete by: As directed    Call MD for:  persistant dizziness or light-headedness   Complete by: As directed    Call MD for:  persistant nausea and vomiting   Complete by: As directed    Call MD for:  temperature >100.4   Complete by: As directed    Diet - low sodium heart healthy   Complete by: As directed    Discharge instructions   Complete by: As directed    1. Please follow-up with PCP in 1-2 weeks 2. Please keep working with Physical therapy and home health nursing staff   Increase activity slowly   Complete by: As directed      Allergies as of 12/01/2019   No Known Allergies     Medication List    STOP taking these medications   methylPREDNISolone 4 MG Tbpk tablet Commonly known as: MEDROL DOSEPAK     TAKE these medications   albuterol 108 (90 Base) MCG/ACT inhaler Commonly known as: VENTOLIN HFA Inhale 2 puffs into the lungs every 4 (four) hours as needed for wheezing or shortness of breath.   amLODipine 5 MG tablet Commonly known as: NORVASC Take  5 mg by mouth daily.   aspirin 81 MG tablet Take 81 mg by mouth daily.   benzonatate 100 MG capsule Commonly known as: TESSALON Take 1-2 capsules (100-200 mg total) by mouth 3 (three) times daily as needed. What changed: reasons to take this   brimonidine 0.15 % ophthalmic solution Commonly known as: ALPHAGAN Place 1 drop into both eyes daily.    dorzolamide-timolol 22.3-6.8 MG/ML ophthalmic solution Commonly known as: COSOPT Place 1 drop into both eyes 2 (two) times daily.   ezetimibe 10 MG tablet Commonly known as: ZETIA Take 10 mg by mouth daily.   furosemide 20 MG tablet Commonly known as: LASIX take 1 tablet by mouth twice a day What changed: when to take this   glimepiride 4 MG tablet Commonly known as: AMARYL Take 4 mg by mouth daily before breakfast.   Lumigan 0.01 % Soln Generic drug: bimatoprost Place 1 drop into both eyes daily.   metFORMIN 1000 MG tablet Commonly known as: GLUCOPHAGE Take 1,000 mg by mouth 2 (two) times daily.   metoprolol succinate 100 MG 24 hr tablet Commonly known as: TOPROL-XL take 1 tablet by mouth once daily   promethazine-dextromethorphan 6.25-15 MG/5ML syrup Commonly known as: PROMETHAZINE-DM Take 5 mLs by mouth at bedtime as needed for cough.   trandolapril 4 MG tablet Commonly known as: MAVIK take 1 tablet by mouth twice a day NEEDS OFFICE VISIT/LABS FOR REFILLS What changed: See the new instructions.            Durable Medical Equipment  (From admission, onward)         Start     Ordered   12/01/19 1140  For home use only DME Walker rolling  Once    Question Answer Comment  Walker: With 5 Inch Wheels   Patient needs a walker to treat with the following condition Fear for personal safety      12/01/19 1141          No Known Allergies  Consultations:  None   Procedures/Studies: DG Chest Port 1 View  Result Date: 11/26/2019 CLINICAL DATA:  Weakness COVID positivity, initial encounter EXAM: PORTABLE CHEST 1 VIEW COMPARISON:  05/25/2018 FINDINGS: The heart size and mediastinal contours are within normal limits. Both lungs are clear. The visualized skeletal structures are unremarkable. IMPRESSION: No active disease. Electronically Signed   By: Alcide Clever M.D.   On: 11/26/2019 18:16   VAS [place] LOWER EXTREMITY VENOUS (DVT)  Result Date: 11/27/2019  Lower  Venous Study Indications: Swelling, and Elevated Ddimer.  Risk Factors: COVID 19 positive. Comparison Study: No prior studies. Performing Technologist: Chanda Busing RVT  Examination Guidelines: A complete evaluation includes B-mode imaging, spectral Doppler, color Doppler, and power Doppler as needed of all accessible portions of each vessel. Bilateral testing is considered an integral part of a complete examination. Limited examinations for reoccurring indications may be performed as noted.  +---------+---------------+---------+-----------+----------+--------------+ RIGHT    CompressibilityPhasicitySpontaneityPropertiesThrombus Aging +---------+---------------+---------+-----------+----------+--------------+ CFV      Full           Yes      Yes                                 +---------+---------------+---------+-----------+----------+--------------+ SFJ      Full                                                        +---------+---------------+---------+-----------+----------+--------------+  FV Prox  Full                                                        +---------+---------------+---------+-----------+----------+--------------+ FV Mid   Full                                                        +---------+---------------+---------+-----------+----------+--------------+ FV DistalFull                                                        +---------+---------------+---------+-----------+----------+--------------+ PFV      Full                                                        +---------+---------------+---------+-----------+----------+--------------+ POP      Full           Yes      Yes                                 +---------+---------------+---------+-----------+----------+--------------+ PTV      Full                                                        +---------+---------------+---------+-----------+----------+--------------+  PERO     Full                                                        +---------+---------------+---------+-----------+----------+--------------+   +---------+---------------+---------+-----------+----------+--------------+ LEFT     CompressibilityPhasicitySpontaneityPropertiesThrombus Aging +---------+---------------+---------+-----------+----------+--------------+ CFV      Full           Yes      Yes                                 +---------+---------------+---------+-----------+----------+--------------+ SFJ      Full                                                        +---------+---------------+---------+-----------+----------+--------------+ FV Prox  Full                                                        +---------+---------------+---------+-----------+----------+--------------+  FV Mid   Full                                                        +---------+---------------+---------+-----------+----------+--------------+ FV DistalFull                                                        +---------+---------------+---------+-----------+----------+--------------+ PFV      Full                                                        +---------+---------------+---------+-----------+----------+--------------+ POP      Full           Yes      Yes                                 +---------+---------------+---------+-----------+----------+--------------+ PTV      Full                                                        +---------+---------------+---------+-----------+----------+--------------+ PERO     Full                                                        +---------+---------------+---------+-----------+----------+--------------+     Summary: Right: There is no evidence of deep vein thrombosis in the lower extremity. No cystic structure found in the popliteal fossa. Left: There is no evidence of deep vein thrombosis in the  lower extremity. No cystic structure found in the popliteal fossa.  *See table(s) above for measurements and observations. Electronically signed by Fabienne Bruns MD on 11/27/2019 at 4:47:06 PM.    Final    (Echo, Carotid, EGD, Colonoscopy, ERCP)    Subjective: No acute events overnight. States he feels better than previously. Has been working with physical and occupational therapy. Doing well. Has been eating and drinking. Would like to go home today. Denies CP and SOB.   Discharge Exam: Vitals:   11/30/19 2208 12/01/19 0624  BP: (!) 151/92 (!) 133/94  Pulse: 100 98  Resp: 18 18  Temp: 98.3 F (36.8 C) 97.7 F (36.5 C)  SpO2: 93% 94%   Vitals:   11/30/19 1200 11/30/19 1700 11/30/19 2208 12/01/19 0624  BP: 128/90 (!) 141/99 (!) 151/92 (!) 133/94  Pulse: (!) 102 80 100 98  Resp:   18 18  Temp: 98.7 F (37.1 C) 98.3 F (36.8 C) 98.3 F (36.8 C) 97.7 F (36.5 C)  TempSrc: Oral Oral Oral Oral  SpO2: 94% 93% 93% 94%  Weight:      Height:  General exam: Appears calm and comfortable, lying in bed without issue, exam unchanged from prior Respiratory system: Clear to auscultation. Respiratory effort normal. Cardiovascular system: S1 & S2 heard, RRR.  Gastrointestinal system: Abdomen is nondistended, soft and nontender. No organomegaly or masses felt. Normal bowel sounds heard. Central nervous system: Alert and orientedx3/3. No focal neurological deficits. Extremities: Symmetric 5 x 5 power. Skin: No rashes, lesions or ulcers Psychiatry: Judgement and insight appear normal. Mood & affect appropriate.   The results of significant diagnostics from this hospitalization (including imaging, microbiology, ancillary and laboratory) are listed below for reference.     Microbiology: Recent Results (from the past 240 hour(s))  Blood Culture (routine x 2)     Status: None (Preliminary result)   Collection Time: 11/26/19  6:22 PM   Specimen: BLOOD  Result Value Ref Range Status    Specimen Description   Final    BLOOD RIGHT ANTECUBITAL Performed at [facility] [address], [place], [place] [address]    Special Requests   Final    BOTTLES DRAWN AEROBIC AND ANAEROBIC Blood Culture adequate volume Performed at [facility] [address], [place], [place] [address]    Culture   Final    NO GROWTH 4 DAYS Performed at [facility], [place] [address], [place], [place] [address]    Report Status PENDING  Incomplete  Blood Culture (routine x 2)     Status: None (Preliminary result)   Collection Time: 11/26/19  6:37 PM   Specimen: BLOOD  Result Value Ref Range Status   Specimen Description   Final    BLOOD LEFT ANTECUBITAL Performed at [facility] [address] [place], [place] [address]    Special Requests   Final    BOTTLES DRAWN AEROBIC AND ANAEROBIC Blood Culture adequate volume Performed at [facility] [address] [place], [place] [address]    Culture   Final    NO GROWTH 4 DAYS Performed at [facility], [place] [address], [place], [place] [address]    Report Status PENDING  Incomplete     Labs: BNP (last 3 results) No results for input(s): BNP in the last 8760 hours. Basic Metabolic Panel: Recent Labs  Lab 11/27/19 0619 11/28/19 0301 11/29/19 0246 11/30/19 0508 12/01/19 0150  NA 139 138 142 143 140  K 3.6 3.4* 3.2* 4.2 3.4*  CL 103 104 107 105 105  CO2 22 22 22 27 24   GLUCOSE 149* 126* 86 133* 124*  BUN 18 24* 26* 22 19  CREATININE 1.27* 1.43* 1.34* 1.44* 1.40*  CALCIUM 8.7* 8.6* 8.4* 8.4* 8.2*   Liver Function Tests: Recent Labs  Lab 11/27/19 0619 11/28/19 0301 11/29/19 0246 11/30/19 0508 12/01/19 0150  AST 32 35 43* 41 32  ALT 19 22 26 28 24   ALKPHOS 58 56 48 51 52  BILITOT 0.5 0.4 0.7 1.3* 0.8  PROT 7.2 6.9 6.4* 6.6 6.4*  ALBUMIN 3.9 3.4* 3.1* 3.2* 3.1*   No results for input(s): LIPASE, AMYLASE in the last 168 hours. No results  for input(s): AMMONIA in the last 168 hours. CBC: Recent Labs  Lab 11/27/19 0619 11/28/19 0301 11/29/19 0246 11/30/19 0508 12/01/19 0150  WBC 7.6 5.6 4.0 3.7* 3.7*  NEUTROABS 6.7 4.6 2.8 2.5 2.5  HGB 16.6 16.0 15.4 15.1 15.6  HCT 53.1* 50.3 48.3 48.8 49.6  MCV 86.1 84.7 83.9 86.1 84.9  PLT 151 153 177 223 245  Cardiac Enzymes: No results for input(s): CKTOTAL, CKMB, CKMBINDEX, TROPONINI in the last 168 hours. BNP: Invalid input(s): POCBNP CBG: Recent Labs  Lab 11/30/19 0754 11/30/19 1139 11/30/19 1630 11/30/19 2206 12/01/19 0735  GLUCAP 100* 172* 77 89 131*   D-Dimer Recent Labs    11/30/19 0508 12/01/19 0150  DDIMER 0.71* 0.66*   Hgb A1c No results for input(s): HGBA1C in the last 72 hours. Lipid Profile No results for input(s): CHOL, HDL, LDLCALC, TRIG, CHOLHDL, LDLDIRECT in the last 72 hours. Thyroid function studies No results for input(s): TSH, T4TOTAL, T3FREE, THYROIDAB in the last 72 hours.  Invalid input(s): FREET3 Anemia work up No results for input(s): VITAMINB12, FOLATE, FERRITIN, TIBC, IRON, RETICCTPCT in the last 72 hours. Urinalysis    Component Value Date/Time   COLORURINE YELLOW 11/26/2019 2058   APPEARANCEUR CLEAR 11/26/2019 2058   LABSPEC 1.012 11/26/2019 2058   PHURINE 7.0 11/26/2019 2058   GLUCOSEU NEGATIVE 11/26/2019 2058   HGBUR NEGATIVE 11/26/2019 2058   BILIRUBINUR NEGATIVE 11/26/2019 2058   KETONESUR 5 (A) 11/26/2019 2058   PROTEINUR 100 (A) 11/26/2019 2058   UROBILINOGEN 1.0 01/26/2012 2310   NITRITE NEGATIVE 11/26/2019 2058   LEUKOCYTESUR NEGATIVE 11/26/2019 2058   Sepsis Labs Invalid input(s): PROCALCITONIN,  WBC,  LACTICIDVEN Microbiology Recent Results (from the past 240 hour(s))  Blood Culture (routine x 2)     Status: None (Preliminary result)   Collection Time: 11/26/19  6:22 PM   Specimen: BLOOD  Result Value Ref Range Status   Specimen Description   Final    BLOOD RIGHT ANTECUBITAL Performed at [facility], [address] [place], [place] [address]    Special Requests   Final    BOTTLES DRAWN AEROBIC AND ANAEROBIC Blood Culture adequate volume Performed at [facility], [address] [place], [place] [address]    Culture   Final    NO GROWTH 4 DAYS Performed at [facility], [address] [place], [place] [address]    Report Status PENDING  Incomplete  Blood Culture (routine x 2)     Status: None (Preliminary result)   Collection Time: 11/26/19  6:37 PM   Specimen: BLOOD  Result Value Ref Range Status   Specimen Description   Final    BLOOD LEFT ANTECUBITAL Performed at [facility], [address] [place], [place] [address]    Special Requests   Final    BOTTLES DRAWN AEROBIC AND ANAEROBIC Blood Culture adequate volume Performed at [facility], [address] [place], [place] [address]    Culture   Final    NO GROWTH 4 DAYS Performed at [facility], [address] [place], [place] [address]    Report Status PENDING  Incomplete     Time coordinating discharge: Over 30 minutes  SIGNED:  Zettie Cooleyhomas F Kael Keetch, MD  [facility] 12/01/2019, 11:53 AM Pager   If 7PM-7AM, please contact night-coverage [email or web address] Password [email or web address]

## 2019-12-02 LAB — CULTURE, BLOOD (ROUTINE X 2)
Culture: NO GROWTH
Culture: NO GROWTH
Special Requests: ADEQUATE
Special Requests: ADEQUATE

## 2019-12-06 DIAGNOSIS — E669 Obesity, unspecified: Secondary | ICD-10-CM | POA: Diagnosis not present

## 2019-12-06 DIAGNOSIS — I872 Venous insufficiency (chronic) (peripheral): Secondary | ICD-10-CM | POA: Diagnosis not present

## 2019-12-06 DIAGNOSIS — I129 Hypertensive chronic kidney disease with stage 1 through stage 4 chronic kidney disease, or unspecified chronic kidney disease: Secondary | ICD-10-CM | POA: Diagnosis not present

## 2019-12-06 DIAGNOSIS — Z86718 Personal history of other venous thrombosis and embolism: Secondary | ICD-10-CM | POA: Diagnosis not present

## 2019-12-06 DIAGNOSIS — U071 COVID-19: Secondary | ICD-10-CM | POA: Diagnosis not present

## 2019-12-06 DIAGNOSIS — E785 Hyperlipidemia, unspecified: Secondary | ICD-10-CM | POA: Diagnosis not present

## 2019-12-06 DIAGNOSIS — E1122 Type 2 diabetes mellitus with diabetic chronic kidney disease: Secondary | ICD-10-CM | POA: Diagnosis not present

## 2019-12-06 DIAGNOSIS — Z9981 Dependence on supplemental oxygen: Secondary | ICD-10-CM | POA: Diagnosis not present

## 2019-12-06 DIAGNOSIS — Z9181 History of falling: Secondary | ICD-10-CM | POA: Diagnosis not present

## 2019-12-06 DIAGNOSIS — Z87891 Personal history of nicotine dependence: Secondary | ICD-10-CM | POA: Diagnosis not present

## 2019-12-06 DIAGNOSIS — E1136 Type 2 diabetes mellitus with diabetic cataract: Secondary | ICD-10-CM | POA: Diagnosis not present

## 2019-12-06 DIAGNOSIS — Z6832 Body mass index (BMI) 32.0-32.9, adult: Secondary | ICD-10-CM | POA: Diagnosis not present

## 2019-12-06 DIAGNOSIS — H409 Unspecified glaucoma: Secondary | ICD-10-CM | POA: Diagnosis not present

## 2019-12-06 DIAGNOSIS — M1610 Unilateral primary osteoarthritis, unspecified hip: Secondary | ICD-10-CM | POA: Diagnosis not present

## 2019-12-06 DIAGNOSIS — I4510 Unspecified right bundle-branch block: Secondary | ICD-10-CM | POA: Diagnosis not present

## 2019-12-06 DIAGNOSIS — Z7982 Long term (current) use of aspirin: Secondary | ICD-10-CM | POA: Diagnosis not present

## 2019-12-06 DIAGNOSIS — Z7984 Long term (current) use of oral hypoglycemic drugs: Secondary | ICD-10-CM | POA: Diagnosis not present

## 2019-12-06 DIAGNOSIS — N183 Chronic kidney disease, stage 3 unspecified: Secondary | ICD-10-CM | POA: Diagnosis not present

## 2019-12-07 DIAGNOSIS — U071 COVID-19: Secondary | ICD-10-CM | POA: Diagnosis not present

## 2019-12-07 DIAGNOSIS — N183 Chronic kidney disease, stage 3 unspecified: Secondary | ICD-10-CM | POA: Diagnosis not present

## 2019-12-07 DIAGNOSIS — E1136 Type 2 diabetes mellitus with diabetic cataract: Secondary | ICD-10-CM | POA: Diagnosis not present

## 2019-12-07 DIAGNOSIS — I129 Hypertensive chronic kidney disease with stage 1 through stage 4 chronic kidney disease, or unspecified chronic kidney disease: Secondary | ICD-10-CM | POA: Diagnosis not present

## 2019-12-07 DIAGNOSIS — I4510 Unspecified right bundle-branch block: Secondary | ICD-10-CM | POA: Diagnosis not present

## 2019-12-07 DIAGNOSIS — E1122 Type 2 diabetes mellitus with diabetic chronic kidney disease: Secondary | ICD-10-CM | POA: Diagnosis not present

## 2019-12-08 DIAGNOSIS — I4510 Unspecified right bundle-branch block: Secondary | ICD-10-CM | POA: Diagnosis not present

## 2019-12-08 DIAGNOSIS — E1136 Type 2 diabetes mellitus with diabetic cataract: Secondary | ICD-10-CM | POA: Diagnosis not present

## 2019-12-08 DIAGNOSIS — U071 COVID-19: Secondary | ICD-10-CM | POA: Diagnosis not present

## 2019-12-08 DIAGNOSIS — N183 Chronic kidney disease, stage 3 unspecified: Secondary | ICD-10-CM | POA: Diagnosis not present

## 2019-12-08 DIAGNOSIS — I129 Hypertensive chronic kidney disease with stage 1 through stage 4 chronic kidney disease, or unspecified chronic kidney disease: Secondary | ICD-10-CM | POA: Diagnosis not present

## 2019-12-08 DIAGNOSIS — E1122 Type 2 diabetes mellitus with diabetic chronic kidney disease: Secondary | ICD-10-CM | POA: Diagnosis not present

## 2019-12-11 DIAGNOSIS — R531 Weakness: Secondary | ICD-10-CM | POA: Diagnosis not present

## 2019-12-11 DIAGNOSIS — U071 COVID-19: Secondary | ICD-10-CM | POA: Diagnosis not present

## 2020-01-01 DIAGNOSIS — H04123 Dry eye syndrome of bilateral lacrimal glands: Secondary | ICD-10-CM | POA: Diagnosis not present

## 2020-01-01 DIAGNOSIS — H401132 Primary open-angle glaucoma, bilateral, moderate stage: Secondary | ICD-10-CM | POA: Diagnosis not present

## 2020-01-05 DIAGNOSIS — U071 COVID-19: Secondary | ICD-10-CM | POA: Diagnosis not present

## 2020-01-15 DIAGNOSIS — E7800 Pure hypercholesterolemia, unspecified: Secondary | ICD-10-CM | POA: Diagnosis not present

## 2020-01-15 DIAGNOSIS — Z8616 Personal history of COVID-19: Secondary | ICD-10-CM | POA: Diagnosis not present

## 2020-01-15 DIAGNOSIS — Z1389 Encounter for screening for other disorder: Secondary | ICD-10-CM | POA: Diagnosis not present

## 2020-01-15 DIAGNOSIS — Z0000 Encounter for general adult medical examination without abnormal findings: Secondary | ICD-10-CM | POA: Diagnosis not present

## 2020-01-15 DIAGNOSIS — I10 Essential (primary) hypertension: Secondary | ICD-10-CM | POA: Diagnosis not present

## 2020-01-15 DIAGNOSIS — E1122 Type 2 diabetes mellitus with diabetic chronic kidney disease: Secondary | ICD-10-CM | POA: Diagnosis not present

## 2020-01-15 DIAGNOSIS — N1831 Chronic kidney disease, stage 3a: Secondary | ICD-10-CM | POA: Diagnosis not present

## 2020-01-30 DIAGNOSIS — I4510 Unspecified right bundle-branch block: Secondary | ICD-10-CM | POA: Diagnosis not present

## 2020-01-30 DIAGNOSIS — R42 Dizziness and giddiness: Secondary | ICD-10-CM | POA: Diagnosis not present

## 2020-01-30 DIAGNOSIS — T68XXXA Hypothermia, initial encounter: Secondary | ICD-10-CM | POA: Diagnosis not present

## 2020-01-30 DIAGNOSIS — I10 Essential (primary) hypertension: Secondary | ICD-10-CM | POA: Diagnosis not present

## 2020-02-16 DIAGNOSIS — N183 Chronic kidney disease, stage 3 unspecified: Secondary | ICD-10-CM | POA: Diagnosis not present

## 2020-02-16 DIAGNOSIS — E1122 Type 2 diabetes mellitus with diabetic chronic kidney disease: Secondary | ICD-10-CM | POA: Diagnosis not present

## 2020-02-16 DIAGNOSIS — E1169 Type 2 diabetes mellitus with other specified complication: Secondary | ICD-10-CM | POA: Diagnosis not present

## 2020-02-16 DIAGNOSIS — Z7984 Long term (current) use of oral hypoglycemic drugs: Secondary | ICD-10-CM | POA: Diagnosis not present

## 2020-02-16 DIAGNOSIS — E119 Type 2 diabetes mellitus without complications: Secondary | ICD-10-CM | POA: Diagnosis not present

## 2020-02-16 DIAGNOSIS — E7800 Pure hypercholesterolemia, unspecified: Secondary | ICD-10-CM | POA: Diagnosis not present

## 2020-02-16 DIAGNOSIS — E1165 Type 2 diabetes mellitus with hyperglycemia: Secondary | ICD-10-CM | POA: Diagnosis not present

## 2020-02-16 DIAGNOSIS — I10 Essential (primary) hypertension: Secondary | ICD-10-CM | POA: Diagnosis not present

## 2020-04-15 DIAGNOSIS — H401132 Primary open-angle glaucoma, bilateral, moderate stage: Secondary | ICD-10-CM | POA: Diagnosis not present

## 2020-04-15 DIAGNOSIS — H04123 Dry eye syndrome of bilateral lacrimal glands: Secondary | ICD-10-CM | POA: Diagnosis not present

## 2020-04-19 DIAGNOSIS — E7800 Pure hypercholesterolemia, unspecified: Secondary | ICD-10-CM | POA: Diagnosis not present

## 2020-04-19 DIAGNOSIS — E1165 Type 2 diabetes mellitus with hyperglycemia: Secondary | ICD-10-CM | POA: Diagnosis not present

## 2020-04-19 DIAGNOSIS — E1122 Type 2 diabetes mellitus with diabetic chronic kidney disease: Secondary | ICD-10-CM | POA: Diagnosis not present

## 2020-04-19 DIAGNOSIS — N183 Chronic kidney disease, stage 3 unspecified: Secondary | ICD-10-CM | POA: Diagnosis not present

## 2020-04-19 DIAGNOSIS — I10 Essential (primary) hypertension: Secondary | ICD-10-CM | POA: Diagnosis not present

## 2020-04-19 DIAGNOSIS — E1169 Type 2 diabetes mellitus with other specified complication: Secondary | ICD-10-CM | POA: Diagnosis not present

## 2020-04-19 DIAGNOSIS — E119 Type 2 diabetes mellitus without complications: Secondary | ICD-10-CM | POA: Diagnosis not present

## 2020-04-29 IMAGING — MR MR HEAD W/O CM
10 series · 48 of 48 positions shown · non-contrast
Comparison: Carotid Doppler ultrasound 06/06/2018.

CLINICAL DATA: 82-year-old male with dizziness, confusion, memory
problems, generalized weakness. Bilateral lower extremity weakness
with difficulty walking. Symptoms for 4-5 months.

EXAM:
MRI HEAD WITHOUT CONTRAST
TECHNIQUE: Multiplanar, multiecho pulse sequences of the brain and surrounding
structures were obtained without intravenous contrast.

[Series 5: T1 · sagittal · 4.0mm · 0.75mm/px · 2 of 32 slices shown (1 of 2)]
[im 1/32]
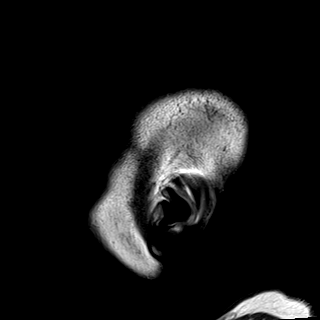
[im 32/32]
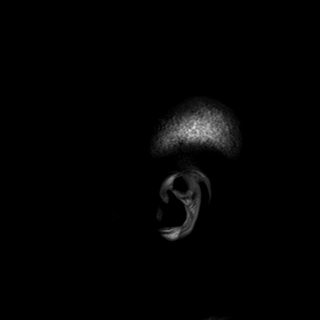

[Series 6: DWI · axial · 3.0mm · 1.50mm/px · z∈[-58,+95]mm · 7 of 98 slices shown (1 of 4)]
[im 1/98]
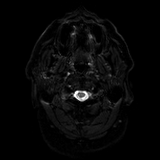
[im 17/98]
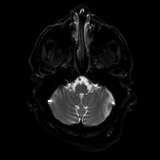
[im 33/98]
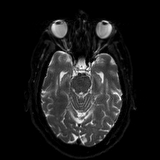
[im 49/98]
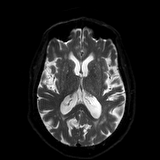
[im 65/98]
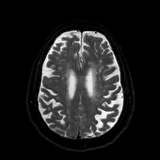
[im 81/98]
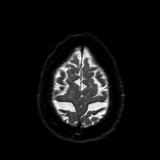
[im 98/98]
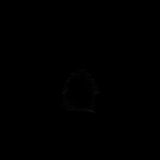

[Series 7: DWI · axial · 3.0mm · 1.50mm/px · z∈[-58,+95]mm · 4 of 49 slices shown (2 of 4)]
[im 1/49]
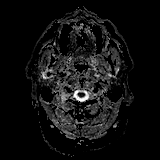
[im 17/49]
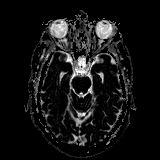
[im 33/49]
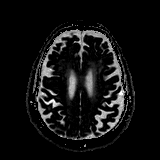
[im 49/49]
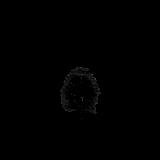

[Series 8: DWI · coronal · 5.0mm · 1.44mm/px · 5 of 74 slices shown (3 of 4)]
[im 1/74]
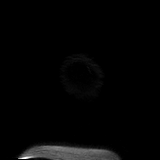
[im 19/74]
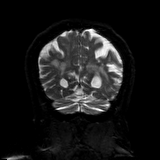
[im 37/74]
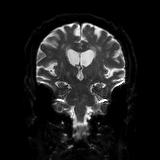
[im 55/74]
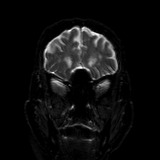
[im 74/74]
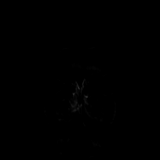

[Series 9: DWI · coronal · 5.0mm · 1.44mm/px · 3 of 37 slices shown (4 of 4)]
[im 1/37]
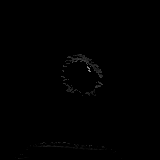
[im 19/37]
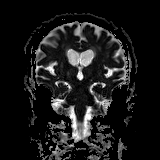
[im 37/37]
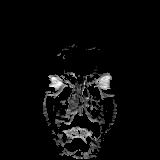

[Series 10: T2 · axial · 4.0mm · 0.38mm/px · z∈[-55,+99]mm · 2 of 32 slices shown (1 of 2)]
[im 1/32]
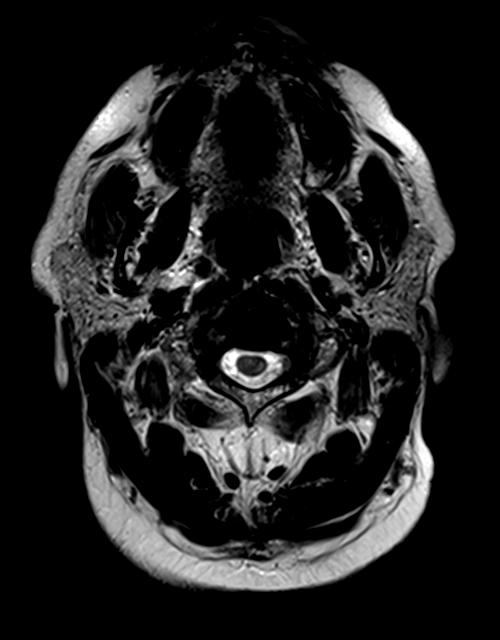
[im 32/32]
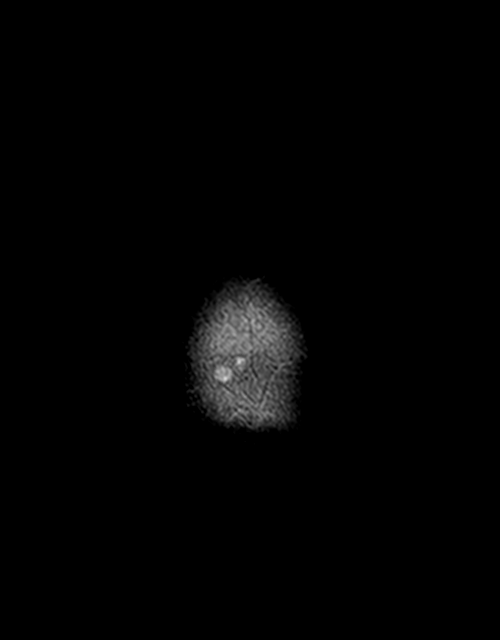

[Series 11: FLAIR · axial · 3.0mm · 0.75mm/px · z∈[-58,+97]mm · 2 of 28 slices shown]
[im 1/28]
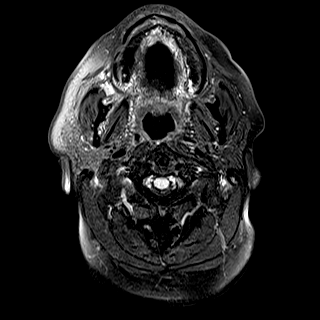
[im 28/28]
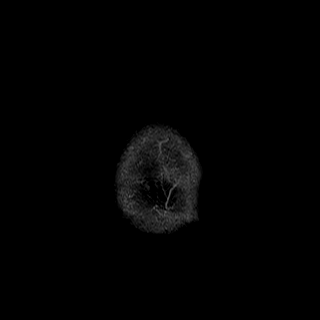

[Series 13: swi_images · axial · 1.5mm · 0.90mm/px · z∈[-53,+94]mm · 8 of 104 slices shown]
[im 1/104]
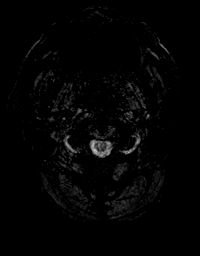
[im 15/104]
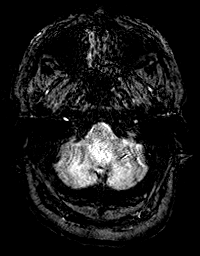
[im 30/104]
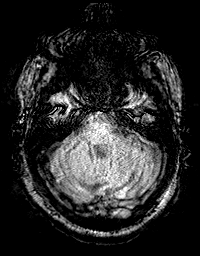
[im 45/104]
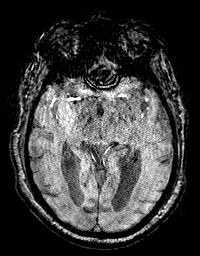
[im 59/104]
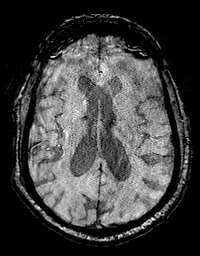
[im 74/104]
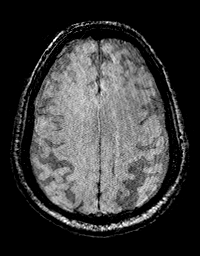
[im 89/104]
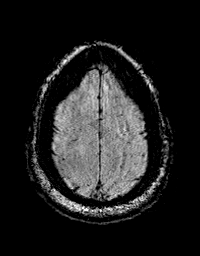
[im 104/104]
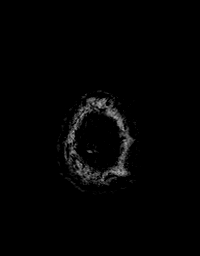

[Series 14: T1 · axial · 1.0mm · 0.94mm/px · z∈[-53,+99]mm · 12 of 160 slices shown (2 of 2)]
[im 1/160]
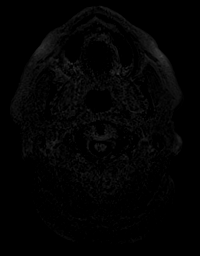
[im 15/160]
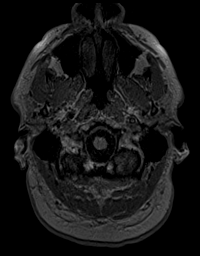
[im 29/160]
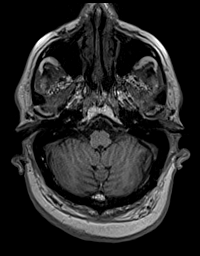
[im 44/160]
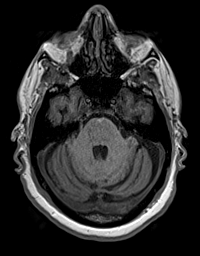
[im 58/160]
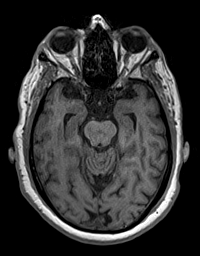
[im 73/160]
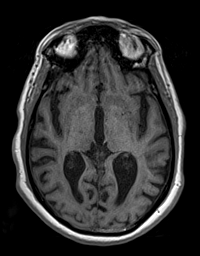
[im 87/160]
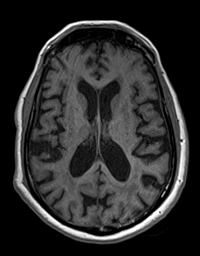
[im 102/160]
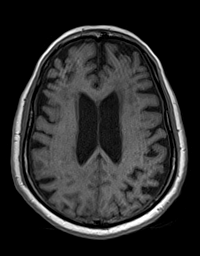
[im 116/160]
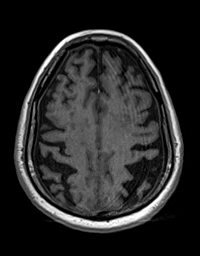
[im 131/160]
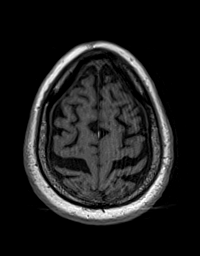
[im 145/160]
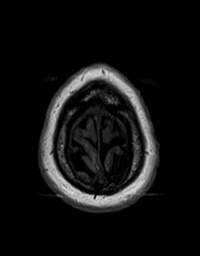
[im 160/160]
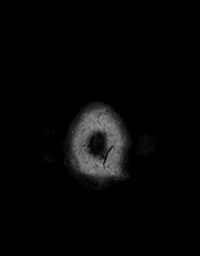

[Series 15: T2 · coronal · 4.5mm · 0.36mm/px · 3 of 34 slices shown (2 of 2)]
[im 1/34]
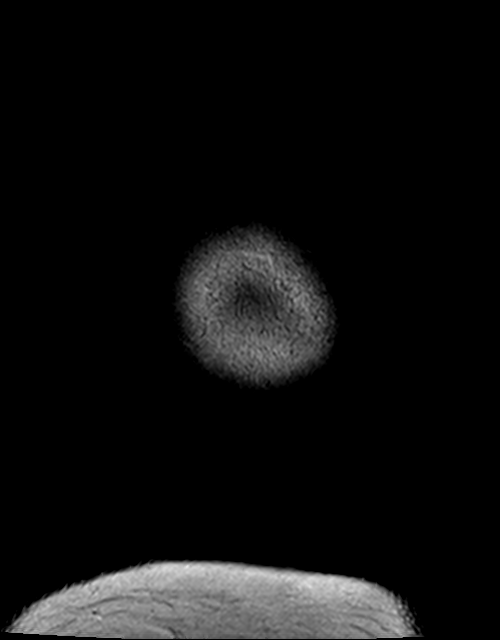
[im 17/34]
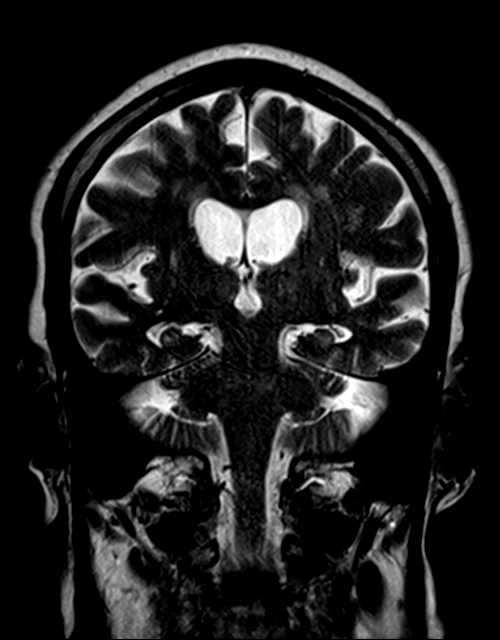
[im 34/34]
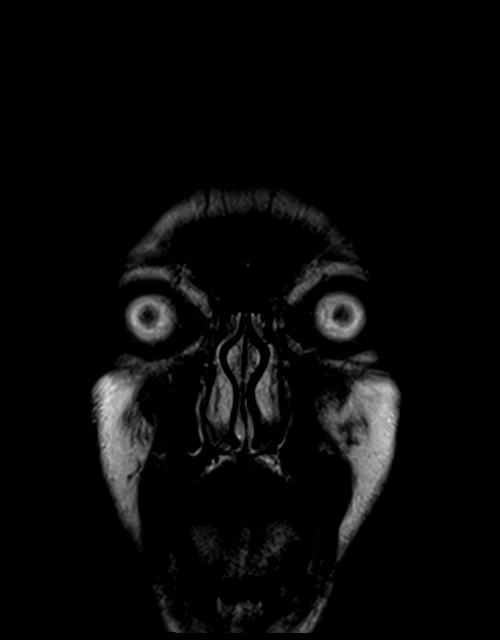

[48 of 48 positions shown; findings below may reference images not displayed]

FINDINGS: Brain: Cerebral volume loss is noted which appears to
disproportionally affect the peri rolandic regions and parietal
lobes.

Temporal lobe volume appears relatively preserved for age.

No restricted diffusion to suggest acute infarction. No midline
shift, mass effect, evidence of mass lesion, ventriculomegaly,
extra-axial collection or acute intracranial hemorrhage.
Cervicomedullary junction and pituitary are within normal limits.

Moderate patchy T2 hyperintensity in the pons. Scattered T2
heterogeneity in the bilateral basal ganglia, although appears more
related to perivascular spaces. There is a small chronic lacunar.
Infarct in the right thalamus patchy widespread bilateral cerebral
white matter T2 and FLAIR hyperintensity. No cortical
encephalomalacia or chronic cerebral blood products. Normal
cerebellum.

Vascular: Major intracranial vascular flow voids are preserved.
Fetal type bilateral PCA origins are suspected (normal variant).

Skull and upper cervical spine: Cervical spine degeneration is
partially visible. Changes are mild in the upper cervical spine,
with suggestion of mild cervical spinal stenosis at C2-C3. However,
it is possible there is more advanced degenerative stenosis at the
C4 level (series 5, image 16).

Visualized bone marrow signal is within normal limits.

Sinuses/Orbits: Normal orbits soft tissues. Trace paranasal sinus
mucosal thickening.

Other: Visible internal auditory structures appear normal. Mastoid
air cells are clear. Scalp and face soft tissues appear negative.
IMPRESSION: 1.  No acute intracranial abnormality.
2. Disproportionate biparietal volume loss which can be seen with
Alzheimer-type dementia. Temporal lobe volume appears within normal
limits for age.
3. Moderate signal changes in the cerebral white matter and pons
most commonly due to chronic small vessel disease. Small chronic
lacunar infarct in the right thalamus.
4. Possible moderate degenerative cervical spinal stenosis at the C4
level.

## 2020-06-10 DIAGNOSIS — H401132 Primary open-angle glaucoma, bilateral, moderate stage: Secondary | ICD-10-CM | POA: Diagnosis not present

## 2020-06-11 DIAGNOSIS — E1122 Type 2 diabetes mellitus with diabetic chronic kidney disease: Secondary | ICD-10-CM | POA: Diagnosis not present

## 2020-06-11 DIAGNOSIS — E7800 Pure hypercholesterolemia, unspecified: Secondary | ICD-10-CM | POA: Diagnosis not present

## 2020-06-11 DIAGNOSIS — E1165 Type 2 diabetes mellitus with hyperglycemia: Secondary | ICD-10-CM | POA: Diagnosis not present

## 2020-06-11 DIAGNOSIS — I10 Essential (primary) hypertension: Secondary | ICD-10-CM | POA: Diagnosis not present

## 2020-06-11 DIAGNOSIS — N183 Chronic kidney disease, stage 3 unspecified: Secondary | ICD-10-CM | POA: Diagnosis not present

## 2020-06-11 DIAGNOSIS — E119 Type 2 diabetes mellitus without complications: Secondary | ICD-10-CM | POA: Diagnosis not present

## 2020-06-11 DIAGNOSIS — E1169 Type 2 diabetes mellitus with other specified complication: Secondary | ICD-10-CM | POA: Diagnosis not present

## 2020-07-01 DIAGNOSIS — H401132 Primary open-angle glaucoma, bilateral, moderate stage: Secondary | ICD-10-CM | POA: Diagnosis not present

## 2020-07-01 DIAGNOSIS — H04123 Dry eye syndrome of bilateral lacrimal glands: Secondary | ICD-10-CM | POA: Diagnosis not present

## 2020-07-15 DIAGNOSIS — I10 Essential (primary) hypertension: Secondary | ICD-10-CM | POA: Diagnosis not present

## 2020-07-15 DIAGNOSIS — N1831 Chronic kidney disease, stage 3a: Secondary | ICD-10-CM | POA: Diagnosis not present

## 2020-07-15 DIAGNOSIS — E1122 Type 2 diabetes mellitus with diabetic chronic kidney disease: Secondary | ICD-10-CM | POA: Diagnosis not present

## 2020-07-15 DIAGNOSIS — E7800 Pure hypercholesterolemia, unspecified: Secondary | ICD-10-CM | POA: Diagnosis not present

## 2020-08-17 DIAGNOSIS — Z23 Encounter for immunization: Secondary | ICD-10-CM | POA: Diagnosis not present

## 2020-08-22 DIAGNOSIS — E1122 Type 2 diabetes mellitus with diabetic chronic kidney disease: Secondary | ICD-10-CM | POA: Diagnosis not present

## 2020-08-22 DIAGNOSIS — E1169 Type 2 diabetes mellitus with other specified complication: Secondary | ICD-10-CM | POA: Diagnosis not present

## 2020-08-22 DIAGNOSIS — N183 Chronic kidney disease, stage 3 unspecified: Secondary | ICD-10-CM | POA: Diagnosis not present

## 2020-08-22 DIAGNOSIS — E1165 Type 2 diabetes mellitus with hyperglycemia: Secondary | ICD-10-CM | POA: Diagnosis not present

## 2020-08-22 DIAGNOSIS — E7800 Pure hypercholesterolemia, unspecified: Secondary | ICD-10-CM | POA: Diagnosis not present

## 2020-08-22 DIAGNOSIS — E119 Type 2 diabetes mellitus without complications: Secondary | ICD-10-CM | POA: Diagnosis not present

## 2020-08-22 DIAGNOSIS — I10 Essential (primary) hypertension: Secondary | ICD-10-CM | POA: Diagnosis not present

## 2020-10-01 DIAGNOSIS — H401132 Primary open-angle glaucoma, bilateral, moderate stage: Secondary | ICD-10-CM | POA: Diagnosis not present

## 2021-01-06 DIAGNOSIS — H2513 Age-related nuclear cataract, bilateral: Secondary | ICD-10-CM | POA: Diagnosis not present

## 2021-01-06 DIAGNOSIS — H401132 Primary open-angle glaucoma, bilateral, moderate stage: Secondary | ICD-10-CM | POA: Diagnosis not present

## 2021-01-28 DIAGNOSIS — I10 Essential (primary) hypertension: Secondary | ICD-10-CM | POA: Diagnosis not present

## 2021-01-28 DIAGNOSIS — E785 Hyperlipidemia, unspecified: Secondary | ICD-10-CM | POA: Diagnosis not present

## 2021-01-28 DIAGNOSIS — E1165 Type 2 diabetes mellitus with hyperglycemia: Secondary | ICD-10-CM | POA: Diagnosis not present

## 2021-02-03 DIAGNOSIS — E7800 Pure hypercholesterolemia, unspecified: Secondary | ICD-10-CM | POA: Diagnosis not present

## 2021-02-03 DIAGNOSIS — N1831 Chronic kidney disease, stage 3a: Secondary | ICD-10-CM | POA: Diagnosis not present

## 2021-02-03 DIAGNOSIS — I10 Essential (primary) hypertension: Secondary | ICD-10-CM | POA: Diagnosis not present

## 2021-02-03 DIAGNOSIS — Z0001 Encounter for general adult medical examination with abnormal findings: Secondary | ICD-10-CM | POA: Diagnosis not present

## 2021-02-03 DIAGNOSIS — E1122 Type 2 diabetes mellitus with diabetic chronic kidney disease: Secondary | ICD-10-CM | POA: Diagnosis not present

## 2021-02-03 DIAGNOSIS — E1169 Type 2 diabetes mellitus with other specified complication: Secondary | ICD-10-CM | POA: Diagnosis not present

## 2021-02-09 ENCOUNTER — Emergency Department (HOSPITAL_COMMUNITY): Payer: Medicare Other

## 2021-02-09 ENCOUNTER — Emergency Department (HOSPITAL_COMMUNITY)
Admission: EM | Admit: 2021-02-09 | Discharge: 2021-02-09 | Disposition: A | Discharge status: Home / Self Care | Payer: Medicare Other | Attending: Emergency Medicine | Admitting: Emergency Medicine

## 2021-02-09 DIAGNOSIS — R001 Bradycardia, unspecified: Secondary | ICD-10-CM | POA: Diagnosis not present

## 2021-02-09 DIAGNOSIS — R4182 Altered mental status, unspecified: Secondary | ICD-10-CM | POA: Diagnosis not present

## 2021-02-09 DIAGNOSIS — Z79899 Other long term (current) drug therapy: Secondary | ICD-10-CM | POA: Diagnosis not present

## 2021-02-09 DIAGNOSIS — E1122 Type 2 diabetes mellitus with diabetic chronic kidney disease: Secondary | ICD-10-CM | POA: Insufficient documentation

## 2021-02-09 DIAGNOSIS — Z87891 Personal history of nicotine dependence: Secondary | ICD-10-CM | POA: Insufficient documentation

## 2021-02-09 DIAGNOSIS — I959 Hypotension, unspecified: Secondary | ICD-10-CM | POA: Diagnosis not present

## 2021-02-09 DIAGNOSIS — Z8616 Personal history of COVID-19: Secondary | ICD-10-CM | POA: Diagnosis not present

## 2021-02-09 DIAGNOSIS — R55 Syncope and collapse: Secondary | ICD-10-CM | POA: Diagnosis not present

## 2021-02-09 DIAGNOSIS — Z7982 Long term (current) use of aspirin: Secondary | ICD-10-CM | POA: Diagnosis not present

## 2021-02-09 DIAGNOSIS — Z7984 Long term (current) use of oral hypoglycemic drugs: Secondary | ICD-10-CM | POA: Diagnosis not present

## 2021-02-09 DIAGNOSIS — J9811 Atelectasis: Secondary | ICD-10-CM | POA: Diagnosis not present

## 2021-02-09 DIAGNOSIS — E860 Dehydration: Secondary | ICD-10-CM | POA: Diagnosis not present

## 2021-02-09 DIAGNOSIS — N179 Acute kidney failure, unspecified: Secondary | ICD-10-CM | POA: Diagnosis not present

## 2021-02-09 DIAGNOSIS — I129 Hypertensive chronic kidney disease with stage 1 through stage 4 chronic kidney disease, or unspecified chronic kidney disease: Secondary | ICD-10-CM | POA: Insufficient documentation

## 2021-02-09 DIAGNOSIS — N183 Chronic kidney disease, stage 3 unspecified: Secondary | ICD-10-CM | POA: Insufficient documentation

## 2021-02-09 DIAGNOSIS — R531 Weakness: Secondary | ICD-10-CM | POA: Diagnosis not present

## 2021-02-09 DIAGNOSIS — R0902 Hypoxemia: Secondary | ICD-10-CM | POA: Diagnosis not present

## 2021-02-09 LAB — CBC WITH DIFFERENTIAL/PLATELET
Abs Immature Granulocytes: 0.02 10*3/uL (ref 0.00–0.07)
Basophils Absolute: 0 10*3/uL (ref 0.0–0.1)
Basophils Relative: 1 %
Eosinophils Absolute: 0.1 10*3/uL (ref 0.0–0.5)
Eosinophils Relative: 1 %
HCT: 49.2 % (ref 39.0–52.0)
Hemoglobin: 16.3 g/dL (ref 13.0–17.0)
Immature Granulocytes: 0 %
Lymphocytes Relative: 9 %
Lymphs Abs: 0.6 10*3/uL — ABNORMAL LOW (ref 0.7–4.0)
MCH: 29.6 pg (ref 26.0–34.0)
MCHC: 33.1 g/dL (ref 30.0–36.0)
MCV: 89.3 fL (ref 80.0–100.0)
Monocytes Absolute: 0.5 10*3/uL (ref 0.1–1.0)
Monocytes Relative: 8 %
Neutro Abs: 5.1 10*3/uL (ref 1.7–7.7)
Neutrophils Relative %: 81 %
Platelets: 170 10*3/uL (ref 150–400)
RBC: 5.51 MIL/uL (ref 4.22–5.81)
RDW: 13.8 % (ref 11.5–15.5)
WBC: 6.3 10*3/uL (ref 4.0–10.5)
nRBC: 0 % (ref 0.0–0.2)

## 2021-02-09 LAB — URINALYSIS, ROUTINE W REFLEX MICROSCOPIC
Bilirubin Urine: NEGATIVE
Glucose, UA: NEGATIVE mg/dL
Hgb urine dipstick: NEGATIVE
Ketones, ur: NEGATIVE mg/dL
Leukocytes,Ua: NEGATIVE
Nitrite: NEGATIVE
Protein, ur: NEGATIVE mg/dL
Specific Gravity, Urine: 1.008 (ref 1.005–1.030)
pH: 6 (ref 5.0–8.0)

## 2021-02-09 LAB — COMPREHENSIVE METABOLIC PANEL
ALT: 15 U/L (ref 0–44)
AST: 18 U/L (ref 15–41)
Albumin: 3.9 g/dL (ref 3.5–5.0)
Alkaline Phosphatase: 66 U/L (ref 38–126)
Anion gap: 6 (ref 5–15)
BUN: 24 mg/dL — ABNORMAL HIGH (ref 8–23)
CO2: 31 mmol/L (ref 22–32)
Calcium: 9.1 mg/dL (ref 8.9–10.3)
Chloride: 104 mmol/L (ref 98–111)
Creatinine, Ser: 1.77 mg/dL — ABNORMAL HIGH (ref 0.61–1.24)
GFR, Estimated: 38 mL/min — ABNORMAL LOW (ref >60)
Glucose, Bld: 165 mg/dL — ABNORMAL HIGH (ref 70–99)
Potassium: 4.2 mmol/L (ref 3.5–5.1)
Sodium: 141 mmol/L (ref 135–145)
Total Bilirubin: 0.9 mg/dL (ref 0.3–1.2)
Total Protein: 7 g/dL (ref 6.5–8.1)

## 2021-02-09 LAB — CBG MONITORING, ED: Glucose-Capillary: 152 mg/dL — ABNORMAL HIGH (ref 70–99)

## 2021-02-09 LAB — LACTIC ACID, PLASMA: Lactic Acid, Venous: 1.6 mmol/L (ref 0.5–1.9)

## 2021-02-09 MED ORDER — SODIUM CHLORIDE 0.9 % IV BOLUS
1000.0000 mL | Freq: Once | INTRAVENOUS | Status: AC
  Administered 2021-02-09: 1000 mL via INTRAVENOUS

## 2021-02-09 NOTE — Discharge Instructions (Signed)
You were seen in the emergency department for an unresponsive episode while at home.  Your CAT scan did not show any acute stroke and your lab work showed you to be mildly dehydrated.  Please contact your primary care doctor for close follow-up.  Stay well-hydrated.  Return to the emergency department if any worsening or concerning symptoms.

## 2021-02-09 NOTE — ED Notes (Signed)
Pt is ambulatory to the bathroom with a steady gait. 

## 2021-02-09 NOTE — ED Notes (Signed)
Patient transported to CT 

## 2021-02-09 NOTE — ED Notes (Signed)
Pt d/c by MD and is provided w/ d/c instructions and follow up care  

## 2021-02-09 NOTE — ED Provider Notes (Signed)
[facility] EMERGENCY DEPARTMENT Provider Note   CSN: [id number] Arrival date & time: 02/09/21  1303     History No chief complaint on file.   Steven Myers is a 86 y.o. male.  He is brought in by EMS for altered mental status.  He had gone to church earlier today with his wife and was home sitting on the couch.  Per EMS wife noticed that he was not responding although his eyes were open.  EMS found him initially with low blood pressure.  Possibly had some abdominal pain.  He received some IV fluids in the ambulance and is now awake and alert with no complaints.  He said he had not eaten today.  Initial blood pressure for EMS was 80/60 with a heart rate in the 40s.  Patient denies any headache chest pain shortness of breath abdominal pain vomiting diarrhea urinary symptoms.  No numbness or weakness.  The history is provided by the patient and the EMS personnel.  Altered Mental Status Presenting symptoms: partial responsiveness   Severity:  Moderate Most recent episode:  Today Episode history:  Single Progression:  Resolved Chronicity:  New Context: not nursing home resident, not recent illness and not recent infection   Associated symptoms: no abdominal pain, no difficulty breathing, no fever, no headaches, no nausea, no rash, no seizures, no slurred speech, no visual change and no vomiting        Past Medical History:  Diagnosis Date  . Bilateral cataracts   . CKD (chronic kidney disease) stage 3, GFR 30-59 ml/min   . DM (diabetes mellitus) type II controlled with renal manifestation (HCC)    Not on insulin  . Essential hypertension   . Glaucoma    Optho: Dr. Cathey Endow  . History of colonoscopy   . Hyperlipidemia associated with type 2 diabetes mellitus (HCC)    Intolerant to statins --> because of elevated LFTs.  . Obesity due to excess calories without serious comorbidity    BMI ~32 - mostly truncal obesity (mesomorphic)  . Osteoarthritis of hip    Right >  Left -- limits walking  . RBBB (right bundle branch block)   . Venous stasis dermatitis of both lower extremities - with edema    L leg worse than R 2/2 prior injury.; controlled somewhat with support hose    Patient Active Problem List   Diagnosis Date Noted  . COVID-19 11/27/2019  . Generalized weakness 11/26/2019  . COVID-19 virus infection 11/26/2019  . Essential hypertension   . DM (diabetes mellitus) type II controlled with renal manifestation (HCC)   . Hyperlipidemia associated with type 2 diabetes mellitus (HCC)   . Venous stasis dermatitis of both lower extremities - with edema   . DOE (dyspnea on exertion) 06/08/2018  . Edema of both lower extremities 06/08/2018  . RBBB 05/05/2010  . Precordial chest pain 05/05/2010  . GASTRITIS 10/14/2009  . FECAL OCCULT BLOOD 09/09/2009    Past Surgical History:  Procedure Laterality Date  . NM MYOVIEW LTD  04/2010   LEXISCAN: EF 62%. NO ISCHEMIA OR INFARCTION. LOW RISK. No RWMA        Family History  Problem Relation Age of Onset  . Diabetes Mother   . Heart disease Father 57  . Colon cancer Neg Hx     Social History   Tobacco Use  . Smoking status: Former Smoker    Types: Cigarettes    Quit date: 1980    Years since quitting: 42.2  .  Smokeless tobacco: Never Used  . Tobacco comment: quit 40 years ago  Substance Use Topics  . Alcohol use: Not Currently    Comment: quit over 20 yr ago  . Drug use: No    Home Medications Prior to Admission medications   Medication Sig Start Date End Date Taking? Authorizing Provider  albuterol (VENTOLIN HFA) 108 (90 Base) MCG/ACT inhaler Inhale 2 puffs into the lungs every 4 (four) hours as needed for wheezing or shortness of breath. 11/23/19   [provider]  amLODipine (NORVASC) 5 MG tablet Take 5 mg by mouth daily. 04/03/18   [provider]  aspirin 81 MG tablet Take 81 mg by mouth daily.      [provider]  benzonatate (TESSALON) 100 MG capsule  Take 1-2 capsules (100-200 mg total) by mouth 3 (three) times daily as needed. Patient taking differently: Take 100-200 mg by mouth 3 (three) times daily as needed for cough.  11/19/19   Wallis BambergMani, Mario, PA-C  brimonidine (ALPHAGAN) 0.15 % ophthalmic solution Place 1 drop into both eyes daily. 05/23/18   [provider]  dorzolamide-timolol (COSOPT) 22.3-6.8 MG/ML ophthalmic solution Place 1 drop into both eyes 2 (two) times daily.  05/27/18   [provider]  ezetimibe (ZETIA) 10 MG tablet Take 10 mg by mouth daily. 03/21/18   [provider]  furosemide (LASIX) 20 MG tablet take 1 tablet by mouth twice a day Patient taking differently: Take 20 mg by mouth 2 (two) times daily.  07/08/12   Wendall StadeNishan, Peter C, MD  glimepiride (AMARYL) 4 MG tablet Take 4 mg by mouth daily before breakfast.    [provider]  LUMIGAN 0.01 % SOLN Place 1 drop into both eyes daily. 04/13/18   [provider]  metFORMIN (GLUCOPHAGE) 1000 MG tablet Take 1,000 mg by mouth 2 (two) times daily. 05/02/18   [provider]  metoprolol succinate (TOPROL-XL) 100 MG 24 hr tablet take 1 tablet by mouth once daily Patient taking differently: Take 100 mg by mouth daily.  05/28/12   Anders SimmondsMcClung, Angela M, PA-C  promethazine-dextromethorphan (PROMETHAZINE-DM) 6.25-15 MG/5ML syrup Take 5 mLs by mouth at bedtime as needed for cough. 11/19/19   Wallis BambergMani, Mario, PA-C  trandolapril (MAVIK) 4 MG tablet take 1 tablet by mouth twice a day NEEDS OFFICE VISIT/LABS FOR REFILLS Patient taking differently: Take 4 mg by mouth 2 (two) times a day.  07/13/12   Sondra Bargesunn, Ryan M, PA-C    Allergies    Patient has no known allergies.  Review of Systems   Review of Systems  Constitutional: Negative for fever.  HENT: Negative for sore throat.   Eyes: Negative for visual disturbance.  Respiratory: Negative for shortness of breath.   Cardiovascular: Negative for chest pain.  Gastrointestinal: Negative for abdominal pain,  nausea and vomiting.  Genitourinary: Negative for dysuria.  Musculoskeletal: Negative for neck pain.  Skin: Negative for rash.  Neurological: Negative for seizures and headaches.    Physical Exam Updated Vital Signs BP (!) 142/81   Pulse 66   Temp 97.7 F (36.5 C) (Axillary)   Resp 16   Ht 6\' 1"  (1.854 m)   Wt 93 kg   SpO2 99%   BMI 27.05 kg/m   Physical Exam Vitals and nursing note reviewed.  Constitutional:      Appearance: Normal appearance. He is well-developed.  HENT:     Head: Normocephalic and atraumatic.  Eyes:     Conjunctiva/sclera: Conjunctivae normal.  Cardiovascular:  Rate and Rhythm: Normal rate and regular rhythm.     Heart sounds: No murmur heard.   Pulmonary:     Effort: Pulmonary effort is normal. No respiratory distress.     Breath sounds: Normal breath sounds.  Abdominal:     Palpations: Abdomen is soft.     Tenderness: There is no abdominal tenderness.  Musculoskeletal:     Cervical back: Neck supple.  Skin:    General: Skin is warm and dry.     Capillary Refill: Capillary refill takes less than 2 seconds.  Neurological:     General: No focal deficit present.     Mental Status: He is alert.     Cranial Nerves: No cranial nerve deficit.     Sensory: No sensory deficit.     Motor: No weakness.     ED Results / Procedures / Treatments   Labs (all labs ordered are listed, but only abnormal results are displayed) Labs Reviewed  CBC WITH DIFFERENTIAL/PLATELET - Abnormal; Notable for the following components:      Result Value   Lymphs Abs 0.6 (*)    All other components within normal limits  COMPREHENSIVE METABOLIC PANEL - Abnormal; Notable for the following components:   Glucose, Bld 165 (*)    BUN 24 (*)    Creatinine, Ser 1.77 (*)    GFR, Estimated 38 (*)    All other components within normal limits  URINALYSIS, ROUTINE W REFLEX MICROSCOPIC - Abnormal; Notable for the following components:   Color, Urine STRAW (*)    All other  components within normal limits  CBG MONITORING, ED - Abnormal; Notable for the following components:   Glucose-Capillary 152 (*)    All other components within normal limits  LACTIC ACID, PLASMA    EKG EKG Interpretation  Date/Time:  Sunday February 09 2021 13:35:26 EDT Ventricular Rate:  72 PR Interval:    QRS Duration: 141 QT Interval:  437 QTC Calculation: 479 R Axis:   -65 Text Interpretation: Sinus rhythm Right bundle branch block Inferior infarct, age indeterminate rate slower than prior 1/21 Confirmed by Meridee Score [phone]) on 02/09/2021 2:00:54 PM   Radiology CT Head Wo Contrast  Result Date: 02/09/2021 CLINICAL DATA:  Mental status change, unknown cause. Additional history provided: Altered mental status and hypotension. EXAM: CT HEAD WITHOUT CONTRAST TECHNIQUE: Contiguous axial images were obtained from the base of the skull through the vertex without intravenous contrast. COMPARISON:  CT head 03/28/2019. brain MRI 06/10/2018. FINDINGS: Brain: Cerebral atrophy, disproportionately affecting the bilateral parietal lobes. Moderate patchy and ill-defined hypoattenuation within the cerebral white matter which is nonspecific, but compatible with chronic small vessel ischemic disease. There is no acute intracranial hemorrhage. No demarcated cortical infarct. No extra-axial fluid collection. No evidence of intracranial mass. No midline shift. Vascular: No hyperdense vessel.  Atherosclerotic calcifications. Skull: Normal. Negative for fracture or focal lesion. Sinuses/Orbits: Visualized orbits show no acute finding. Trace bilateral ethmoid sinus mucosal thickening. IMPRESSION: No evidence of acute intracranial abnormality. Redemonstrated parietal lobe predominant cerebral atrophy. Moderate cerebral white matter chronic small vessel ischemic disease. Electronically Signed   By: Jackey Loge DO   On: 02/09/2021 14:39   DG Chest Port 1 View  Result Date: 02/09/2021 CLINICAL DATA:  Weakness,  syncope EXAM: PORTABLE CHEST 1 VIEW COMPARISON:  Chest radiograph dated 11/26/2019 FINDINGS: The heart size is normal. Vascular calcifications are seen in the aortic arch. There is mild left basilar atelectasis. May trace left pleural effusion is difficult to exclude.  The right lung is clear and there is no right pleural effusion. Lucency at the right lung apex is favored to reflect overlying tissues, but may represent a small pneumothorax. Degenerative changes are seen in the spine. IMPRESSION: 1. Possible small right apical pneumothorax. A repeat upright chest radiograph could be performed to confirm or exclude a pneumothorax. 2. Mild left basilar atelectasis and possible trace left pleural effusion. Electronically Signed   By: Romona Curls M.D.   On: 02/09/2021 13:55    Procedures Procedures   Medications Ordered in ED Medications  sodium chloride 0.9 % bolus 1,000 mL (0 mLs Intravenous Stopped 02/09/21 1545)    ED Course  I have reviewed the triage vital signs and the nursing notes.  Pertinent labs & imaging results that were available during my care of the patient were reviewed by me and considered in my medical decision making (see chart for details).  Clinical Course as of 02/09/21 1741  Wynelle Link Feb 09, 2021  1445 Patient son is here now.  He said he has had these spells before and they never really figured out an answer before.  He said sometimes was low blood sugar once it was a UTI.  He feels his father is back to baseline now.  Hungry.  CT showing no acute findings. [MB]  1519 Creatinine elevated so that would fit with possible dehydration as a cause of his hypotension and syncope. [MB]  1554 Patient ambulated in the department without any difficulty.  No dizziness.  Asking to eat [MB]    Clinical Course User Index [MB] Terrilee Files, MD   MDM Rules/Calculators/A&P                         This patient complains of unresponsive episode low blood pressure low heart rate; this  involves an extensive number of treatment Options and is a complaint that carries with it a high risk of complications and Morbidity. The differential includes syncope, presyncope, hypotension, hypovolemia, arrhythmia, infection, vascular, ACS  I ordered, reviewed and interpreted labs, which included CBC with normal white count normal hemoglobin, chemistries fairly normal other than elevated BUN and creatinine glucose reflecting some possible dehydration LFTs, normal lactate, urinalysis negative I ordered medication IV fluids with improvement in his symptoms I ordered imaging studies which included chest x-ray and head CT and I independently    visualized and interpreted imaging which showed no acute findings Additional history obtained from EMS and patient's son Previous records obtained and reviewed in epic, no recent admissions  After the interventions stated above, I reevaluated the patient and found patient be awake alert without any symptoms.  He is ambulated in the department without any difficulty.  He is comfortable plan for discharge and follow-up PCP.  Return instructions discussed   Final Clinical Impression(s) / ED Diagnoses Final diagnoses:  Near syncope  AKI (acute kidney injury) (HCC)  Dehydration    Rx / DC Orders ED Discharge Orders    None       Terrilee Files, MD 02/09/21 1743

## 2021-02-09 NOTE — ED Triage Notes (Signed)
Pt brought to ED via EMS from home with c/o AMS, abd pain, hypotension. Symptoms resolved PTA, per EMS. Pt A&O x 4 in ED, NAD, VSS.   EMS v/s: 80/60 initial BP, increased to 140/80 46 HR, increased to 60 RBBB 150 mL NS given PTA

## 2021-02-10 DIAGNOSIS — N1831 Chronic kidney disease, stage 3a: Secondary | ICD-10-CM | POA: Diagnosis not present

## 2021-02-10 DIAGNOSIS — E1122 Type 2 diabetes mellitus with diabetic chronic kidney disease: Secondary | ICD-10-CM | POA: Diagnosis not present

## 2021-02-10 DIAGNOSIS — E7800 Pure hypercholesterolemia, unspecified: Secondary | ICD-10-CM | POA: Diagnosis not present

## 2021-02-10 DIAGNOSIS — E1165 Type 2 diabetes mellitus with hyperglycemia: Secondary | ICD-10-CM | POA: Diagnosis not present

## 2021-02-10 DIAGNOSIS — I10 Essential (primary) hypertension: Secondary | ICD-10-CM | POA: Diagnosis not present

## 2021-02-10 DIAGNOSIS — E1169 Type 2 diabetes mellitus with other specified complication: Secondary | ICD-10-CM | POA: Diagnosis not present

## 2021-02-18 DIAGNOSIS — H401132 Primary open-angle glaucoma, bilateral, moderate stage: Secondary | ICD-10-CM | POA: Diagnosis not present

## 2021-04-14 DIAGNOSIS — Z23 Encounter for immunization: Secondary | ICD-10-CM | POA: Diagnosis not present

## 2021-04-22 DIAGNOSIS — E1165 Type 2 diabetes mellitus with hyperglycemia: Secondary | ICD-10-CM | POA: Diagnosis not present

## 2021-04-22 DIAGNOSIS — E119 Type 2 diabetes mellitus without complications: Secondary | ICD-10-CM | POA: Diagnosis not present

## 2021-04-22 DIAGNOSIS — E7800 Pure hypercholesterolemia, unspecified: Secondary | ICD-10-CM | POA: Diagnosis not present

## 2021-04-22 DIAGNOSIS — N189 Chronic kidney disease, unspecified: Secondary | ICD-10-CM | POA: Diagnosis not present

## 2021-04-22 DIAGNOSIS — I10 Essential (primary) hypertension: Secondary | ICD-10-CM | POA: Diagnosis not present

## 2021-04-22 DIAGNOSIS — E1169 Type 2 diabetes mellitus with other specified complication: Secondary | ICD-10-CM | POA: Diagnosis not present

## 2021-04-22 DIAGNOSIS — E1122 Type 2 diabetes mellitus with diabetic chronic kidney disease: Secondary | ICD-10-CM | POA: Diagnosis not present

## 2021-05-27 DIAGNOSIS — Z20822 Contact with and (suspected) exposure to covid-19: Secondary | ICD-10-CM | POA: Diagnosis not present

## 2021-06-11 DIAGNOSIS — Z20822 Contact with and (suspected) exposure to covid-19: Secondary | ICD-10-CM | POA: Diagnosis not present

## 2021-06-12 DIAGNOSIS — N189 Chronic kidney disease, unspecified: Secondary | ICD-10-CM | POA: Diagnosis not present

## 2021-06-12 DIAGNOSIS — E1122 Type 2 diabetes mellitus with diabetic chronic kidney disease: Secondary | ICD-10-CM | POA: Diagnosis not present

## 2021-06-12 DIAGNOSIS — E1169 Type 2 diabetes mellitus with other specified complication: Secondary | ICD-10-CM | POA: Diagnosis not present

## 2021-06-12 DIAGNOSIS — I10 Essential (primary) hypertension: Secondary | ICD-10-CM | POA: Diagnosis not present

## 2021-06-12 DIAGNOSIS — E119 Type 2 diabetes mellitus without complications: Secondary | ICD-10-CM | POA: Diagnosis not present

## 2021-06-12 DIAGNOSIS — E1165 Type 2 diabetes mellitus with hyperglycemia: Secondary | ICD-10-CM | POA: Diagnosis not present

## 2021-06-12 DIAGNOSIS — E7800 Pure hypercholesterolemia, unspecified: Secondary | ICD-10-CM | POA: Diagnosis not present

## 2021-06-24 DIAGNOSIS — H401132 Primary open-angle glaucoma, bilateral, moderate stage: Secondary | ICD-10-CM | POA: Diagnosis not present

## 2021-07-06 DIAGNOSIS — Z20822 Contact with and (suspected) exposure to covid-19: Secondary | ICD-10-CM | POA: Diagnosis not present

## 2021-08-06 DIAGNOSIS — E1122 Type 2 diabetes mellitus with diabetic chronic kidney disease: Secondary | ICD-10-CM | POA: Diagnosis not present

## 2021-08-06 DIAGNOSIS — N1831 Chronic kidney disease, stage 3a: Secondary | ICD-10-CM | POA: Diagnosis not present

## 2021-08-06 DIAGNOSIS — G720 Drug-induced myopathy: Secondary | ICD-10-CM | POA: Diagnosis not present

## 2021-08-06 DIAGNOSIS — Z23 Encounter for immunization: Secondary | ICD-10-CM | POA: Diagnosis not present

## 2021-08-06 DIAGNOSIS — R600 Localized edema: Secondary | ICD-10-CM | POA: Diagnosis not present

## 2021-08-06 DIAGNOSIS — I10 Essential (primary) hypertension: Secondary | ICD-10-CM | POA: Diagnosis not present

## 2021-08-06 DIAGNOSIS — E7800 Pure hypercholesterolemia, unspecified: Secondary | ICD-10-CM | POA: Diagnosis not present

## 2021-08-18 DIAGNOSIS — E7800 Pure hypercholesterolemia, unspecified: Secondary | ICD-10-CM | POA: Diagnosis not present

## 2021-08-18 DIAGNOSIS — E1122 Type 2 diabetes mellitus with diabetic chronic kidney disease: Secondary | ICD-10-CM | POA: Diagnosis not present

## 2021-08-18 DIAGNOSIS — N1831 Chronic kidney disease, stage 3a: Secondary | ICD-10-CM | POA: Diagnosis not present

## 2021-08-18 DIAGNOSIS — E1169 Type 2 diabetes mellitus with other specified complication: Secondary | ICD-10-CM | POA: Diagnosis not present

## 2021-08-18 DIAGNOSIS — I10 Essential (primary) hypertension: Secondary | ICD-10-CM | POA: Diagnosis not present

## 2021-09-30 DIAGNOSIS — Z23 Encounter for immunization: Secondary | ICD-10-CM | POA: Diagnosis not present

## 2021-09-30 DIAGNOSIS — H401132 Primary open-angle glaucoma, bilateral, moderate stage: Secondary | ICD-10-CM | POA: Diagnosis not present

## 2021-09-30 DIAGNOSIS — H04123 Dry eye syndrome of bilateral lacrimal glands: Secondary | ICD-10-CM | POA: Diagnosis not present

## 2021-11-03 DIAGNOSIS — Z20822 Contact with and (suspected) exposure to covid-19: Secondary | ICD-10-CM | POA: Diagnosis not present

## 2021-12-01 DIAGNOSIS — I10 Essential (primary) hypertension: Secondary | ICD-10-CM | POA: Diagnosis not present

## 2021-12-01 DIAGNOSIS — E1122 Type 2 diabetes mellitus with diabetic chronic kidney disease: Secondary | ICD-10-CM | POA: Diagnosis not present

## 2021-12-01 DIAGNOSIS — E7800 Pure hypercholesterolemia, unspecified: Secondary | ICD-10-CM | POA: Diagnosis not present

## 2021-12-01 DIAGNOSIS — N1831 Chronic kidney disease, stage 3a: Secondary | ICD-10-CM | POA: Diagnosis not present

## 2021-12-01 DIAGNOSIS — E1165 Type 2 diabetes mellitus with hyperglycemia: Secondary | ICD-10-CM | POA: Diagnosis not present

## 2021-12-01 DIAGNOSIS — E1169 Type 2 diabetes mellitus with other specified complication: Secondary | ICD-10-CM | POA: Diagnosis not present

## 2022-01-02 DIAGNOSIS — H401132 Primary open-angle glaucoma, bilateral, moderate stage: Secondary | ICD-10-CM | POA: Diagnosis not present

## 2022-01-02 DIAGNOSIS — H04123 Dry eye syndrome of bilateral lacrimal glands: Secondary | ICD-10-CM | POA: Diagnosis not present

## 2022-01-02 DIAGNOSIS — H2513 Age-related nuclear cataract, bilateral: Secondary | ICD-10-CM | POA: Diagnosis not present

## 2022-02-01 DIAGNOSIS — Z20822 Contact with and (suspected) exposure to covid-19: Secondary | ICD-10-CM | POA: Diagnosis not present

## 2022-02-04 DIAGNOSIS — E1122 Type 2 diabetes mellitus with diabetic chronic kidney disease: Secondary | ICD-10-CM | POA: Diagnosis not present

## 2022-02-04 DIAGNOSIS — Z1389 Encounter for screening for other disorder: Secondary | ICD-10-CM | POA: Diagnosis not present

## 2022-02-04 DIAGNOSIS — I8310 Varicose veins of unspecified lower extremity with inflammation: Secondary | ICD-10-CM | POA: Diagnosis not present

## 2022-02-04 DIAGNOSIS — E7800 Pure hypercholesterolemia, unspecified: Secondary | ICD-10-CM | POA: Diagnosis not present

## 2022-02-04 DIAGNOSIS — N1831 Chronic kidney disease, stage 3a: Secondary | ICD-10-CM | POA: Diagnosis not present

## 2022-02-04 DIAGNOSIS — Z0000 Encounter for general adult medical examination without abnormal findings: Secondary | ICD-10-CM | POA: Diagnosis not present

## 2022-02-04 DIAGNOSIS — I700 Atherosclerosis of aorta: Secondary | ICD-10-CM | POA: Diagnosis not present

## 2022-02-04 DIAGNOSIS — H409 Unspecified glaucoma: Secondary | ICD-10-CM | POA: Diagnosis not present

## 2022-02-04 DIAGNOSIS — G720 Drug-induced myopathy: Secondary | ICD-10-CM | POA: Diagnosis not present

## 2022-02-04 DIAGNOSIS — M25569 Pain in unspecified knee: Secondary | ICD-10-CM | POA: Diagnosis not present

## 2022-02-04 DIAGNOSIS — I10 Essential (primary) hypertension: Secondary | ICD-10-CM | POA: Diagnosis not present

## 2022-02-04 DIAGNOSIS — E1169 Type 2 diabetes mellitus with other specified complication: Secondary | ICD-10-CM | POA: Diagnosis not present

## 2022-02-06 DIAGNOSIS — H401132 Primary open-angle glaucoma, bilateral, moderate stage: Secondary | ICD-10-CM | POA: Diagnosis not present

## 2022-02-20 DIAGNOSIS — Z20822 Contact with and (suspected) exposure to covid-19: Secondary | ICD-10-CM | POA: Diagnosis not present

## 2022-03-04 DIAGNOSIS — R059 Cough, unspecified: Secondary | ICD-10-CM | POA: Diagnosis not present

## 2022-03-04 DIAGNOSIS — Z20822 Contact with and (suspected) exposure to covid-19: Secondary | ICD-10-CM | POA: Diagnosis not present

## 2022-03-04 DIAGNOSIS — R051 Acute cough: Secondary | ICD-10-CM | POA: Diagnosis not present

## 2022-03-09 DIAGNOSIS — Z20822 Contact with and (suspected) exposure to covid-19: Secondary | ICD-10-CM | POA: Diagnosis not present

## 2022-03-18 DIAGNOSIS — Z20822 Contact with and (suspected) exposure to covid-19: Secondary | ICD-10-CM | POA: Diagnosis not present

## 2022-03-20 DIAGNOSIS — E1122 Type 2 diabetes mellitus with diabetic chronic kidney disease: Secondary | ICD-10-CM | POA: Diagnosis not present

## 2022-03-20 DIAGNOSIS — N1831 Chronic kidney disease, stage 3a: Secondary | ICD-10-CM | POA: Diagnosis not present

## 2022-03-20 DIAGNOSIS — E7800 Pure hypercholesterolemia, unspecified: Secondary | ICD-10-CM | POA: Diagnosis not present

## 2022-03-20 DIAGNOSIS — I10 Essential (primary) hypertension: Secondary | ICD-10-CM | POA: Diagnosis not present

## 2022-03-24 DIAGNOSIS — Z20822 Contact with and (suspected) exposure to covid-19: Secondary | ICD-10-CM | POA: Diagnosis not present

## 2022-03-30 DIAGNOSIS — Z20822 Contact with and (suspected) exposure to covid-19: Secondary | ICD-10-CM | POA: Diagnosis not present

## 2022-05-15 DIAGNOSIS — H401132 Primary open-angle glaucoma, bilateral, moderate stage: Secondary | ICD-10-CM | POA: Diagnosis not present

## 2022-06-11 ENCOUNTER — Emergency Department (HOSPITAL_COMMUNITY): Payer: Medicare Other

## 2022-06-11 ENCOUNTER — Observation Stay (HOSPITAL_COMMUNITY)
Admission: EM | Admit: 2022-06-11 | Discharge: 2022-06-12 | Disposition: A | Discharge status: Home Health | Payer: Medicare Other | Attending: Internal Medicine | Admitting: Internal Medicine

## 2022-06-11 ENCOUNTER — Encounter (HOSPITAL_COMMUNITY): Payer: Self-pay

## 2022-06-11 ENCOUNTER — Other Ambulatory Visit: Payer: Self-pay

## 2022-06-11 DIAGNOSIS — Z79899 Other long term (current) drug therapy: Secondary | ICD-10-CM | POA: Insufficient documentation

## 2022-06-11 DIAGNOSIS — I890 Lymphedema, not elsewhere classified: Secondary | ICD-10-CM | POA: Insufficient documentation

## 2022-06-11 DIAGNOSIS — Z7982 Long term (current) use of aspirin: Secondary | ICD-10-CM | POA: Insufficient documentation

## 2022-06-11 DIAGNOSIS — N179 Acute kidney failure, unspecified: Secondary | ICD-10-CM | POA: Diagnosis present

## 2022-06-11 DIAGNOSIS — E1169 Type 2 diabetes mellitus with other specified complication: Secondary | ICD-10-CM | POA: Diagnosis present

## 2022-06-11 DIAGNOSIS — Z87891 Personal history of nicotine dependence: Secondary | ICD-10-CM | POA: Insufficient documentation

## 2022-06-11 DIAGNOSIS — R531 Weakness: Secondary | ICD-10-CM | POA: Diagnosis not present

## 2022-06-11 DIAGNOSIS — I214 Non-ST elevation (NSTEMI) myocardial infarction: Secondary | ICD-10-CM | POA: Diagnosis not present

## 2022-06-11 DIAGNOSIS — I10 Essential (primary) hypertension: Secondary | ICD-10-CM | POA: Diagnosis not present

## 2022-06-11 DIAGNOSIS — R4182 Altered mental status, unspecified: Secondary | ICD-10-CM | POA: Diagnosis not present

## 2022-06-11 DIAGNOSIS — U071 COVID-19: Principal | ICD-10-CM | POA: Diagnosis present

## 2022-06-11 DIAGNOSIS — E1122 Type 2 diabetes mellitus with diabetic chronic kidney disease: Secondary | ICD-10-CM | POA: Diagnosis not present

## 2022-06-11 DIAGNOSIS — M6281 Muscle weakness (generalized): Secondary | ICD-10-CM | POA: Diagnosis not present

## 2022-06-11 DIAGNOSIS — Z7984 Long term (current) use of oral hypoglycemic drugs: Secondary | ICD-10-CM | POA: Diagnosis not present

## 2022-06-11 DIAGNOSIS — E785 Hyperlipidemia, unspecified: Secondary | ICD-10-CM | POA: Diagnosis not present

## 2022-06-11 DIAGNOSIS — I129 Hypertensive chronic kidney disease with stage 1 through stage 4 chronic kidney disease, or unspecified chronic kidney disease: Secondary | ICD-10-CM | POA: Insufficient documentation

## 2022-06-11 DIAGNOSIS — N1831 Chronic kidney disease, stage 3a: Secondary | ICD-10-CM | POA: Diagnosis not present

## 2022-06-11 DIAGNOSIS — E1129 Type 2 diabetes mellitus with other diabetic kidney complication: Secondary | ICD-10-CM | POA: Diagnosis present

## 2022-06-11 DIAGNOSIS — Z8616 Personal history of COVID-19: Secondary | ICD-10-CM | POA: Diagnosis not present

## 2022-06-11 HISTORY — DX: Chest pain, unspecified: R07.9

## 2022-06-11 HISTORY — DX: Cardiomyopathy, unspecified: I42.9

## 2022-06-11 LAB — CBC WITH DIFFERENTIAL/PLATELET
Abs Immature Granulocytes: 0.05 10*3/uL (ref 0.00–0.07)
Basophils Absolute: 0 10*3/uL (ref 0.0–0.1)
Basophils Relative: 1 %
Eosinophils Absolute: 0.1 10*3/uL (ref 0.0–0.5)
Eosinophils Relative: 2 %
HCT: 50 % (ref 39.0–52.0)
Hemoglobin: 16.5 g/dL (ref 13.0–17.0)
Immature Granulocytes: 1 %
Lymphocytes Relative: 6 %
Lymphs Abs: 0.4 10*3/uL — ABNORMAL LOW (ref 0.7–4.0)
MCH: 28.5 pg (ref 26.0–34.0)
MCHC: 33 g/dL (ref 30.0–36.0)
MCV: 86.5 fL (ref 80.0–100.0)
Monocytes Absolute: 0.9 10*3/uL (ref 0.1–1.0)
Monocytes Relative: 15 %
Neutro Abs: 4.4 10*3/uL (ref 1.7–7.7)
Neutrophils Relative %: 75 %
Platelets: 128 10*3/uL — ABNORMAL LOW (ref 150–400)
RBC: 5.78 MIL/uL (ref 4.22–5.81)
RDW: 14.4 % (ref 11.5–15.5)
WBC: 5.8 10*3/uL (ref 4.0–10.5)
nRBC: 0 % (ref 0.0–0.2)

## 2022-06-11 LAB — URINALYSIS, ROUTINE W REFLEX MICROSCOPIC
Bacteria, UA: NONE SEEN
Bilirubin Urine: NEGATIVE
Glucose, UA: NEGATIVE mg/dL
Ketones, ur: 5 mg/dL — AB
Leukocytes,Ua: NEGATIVE
Nitrite: NEGATIVE
Protein, ur: 100 mg/dL — AB
Specific Gravity, Urine: 1.01 (ref 1.005–1.030)
pH: 7 (ref 5.0–8.0)

## 2022-06-11 LAB — C-REACTIVE PROTEIN: CRP: 10.4 mg/dL — ABNORMAL HIGH (ref <1.0)

## 2022-06-11 LAB — COMPREHENSIVE METABOLIC PANEL
ALT: 16 U/L (ref 0–44)
AST: 37 U/L (ref 15–41)
Albumin: 3.9 g/dL (ref 3.5–5.0)
Alkaline Phosphatase: 73 U/L (ref 38–126)
Anion gap: 10 (ref 5–15)
BUN: 16 mg/dL (ref 8–23)
CO2: 24 mmol/L (ref 22–32)
Calcium: 9 mg/dL (ref 8.9–10.3)
Chloride: 109 mmol/L (ref 98–111)
Creatinine, Ser: 1.78 mg/dL — ABNORMAL HIGH (ref 0.61–1.24)
GFR, Estimated: 37 mL/min — ABNORMAL LOW (ref >60)
Glucose, Bld: 122 mg/dL — ABNORMAL HIGH (ref 70–99)
Potassium: 4.2 mmol/L (ref 3.5–5.1)
Sodium: 143 mmol/L (ref 135–145)
Total Bilirubin: 0.7 mg/dL (ref 0.3–1.2)
Total Protein: 7.1 g/dL (ref 6.5–8.1)

## 2022-06-11 LAB — CBG MONITORING, ED: Glucose-Capillary: 118 mg/dL — ABNORMAL HIGH (ref 70–99)

## 2022-06-11 LAB — HEMOGLOBIN A1C
Hgb A1c MFr Bld: 6.6 % — ABNORMAL HIGH (ref 4.8–5.6)
Mean Plasma Glucose: 142.72 mg/dL

## 2022-06-11 LAB — TROPONIN I (HIGH SENSITIVITY)
Troponin I (High Sensitivity): 343 ng/L (ref <18)
Troponin I (High Sensitivity): 419 ng/L (ref <18)
Troponin I (High Sensitivity): 398 ng/L (ref <18)
Troponin I (High Sensitivity): 416 ng/L (ref <18)

## 2022-06-11 LAB — LACTATE DEHYDROGENASE: LDH: 241 U/L — ABNORMAL HIGH (ref 98–192)

## 2022-06-11 LAB — SARS CORONAVIRUS 2 BY RT PCR: SARS Coronavirus 2 by RT PCR: POSITIVE — AB

## 2022-06-11 LAB — FIBRINOGEN: Fibrinogen: 596 mg/dL — ABNORMAL HIGH (ref 210–475)

## 2022-06-11 LAB — PROCALCITONIN: Procalcitonin: <0.1 ng/mL

## 2022-06-11 LAB — GLUCOSE, CAPILLARY
Glucose-Capillary: 90 mg/dL (ref 70–99)
Glucose-Capillary: 100 mg/dL — ABNORMAL HIGH (ref 70–99)

## 2022-06-11 LAB — FERRITIN: Ferritin: 74 ng/mL (ref 24–336)

## 2022-06-11 LAB — D-DIMER, QUANTITATIVE: D-Dimer, Quant: 0.71 ug/mL-FEU — ABNORMAL HIGH (ref 0.00–0.50)

## 2022-06-11 LAB — BRAIN NATRIURETIC PEPTIDE: B Natriuretic Peptide: 391 pg/mL — ABNORMAL HIGH (ref 0.0–100.0)

## 2022-06-11 LAB — MAGNESIUM: Magnesium: 2 mg/dL (ref 1.7–2.4)

## 2022-06-11 MED ORDER — GUAIFENESIN-DM 100-10 MG/5ML PO SYRP: 10.0000 mL | ORAL_SOLUTION | ORAL | Status: DC | PRN

## 2022-06-11 MED ORDER — BRIMONIDINE TARTRATE-TIMOLOL 0.2-0.5 % OP SOLN
1.0000 [drp] | Freq: Two times a day (BID) | OPHTHALMIC | Status: DC
  Filled 2022-06-11: qty 5

## 2022-06-11 MED ORDER — ONDANSETRON HCL 4 MG/2ML IJ SOLN: 4.0000 mg | Freq: Four times a day (QID) | INTRAMUSCULAR | Status: DC | PRN

## 2022-06-11 MED ORDER — ACETAMINOPHEN 325 MG PO TABS
650.0000 mg | ORAL_TABLET | Freq: Four times a day (QID) | ORAL | Status: DC | PRN
  Administered 2022-06-12: 650 mg via ORAL
  Filled 2022-06-11: qty 2

## 2022-06-11 MED ORDER — NIRMATRELVIR/RITONAVIR (PAXLOVID) TABLET (RENAL DOSING)
1.0000 | ORAL_TABLET | Freq: Two times a day (BID) | ORAL | Status: DC
  Administered 2022-06-11 – 2022-06-12 (×2): 1 via ORAL
  Filled 2022-06-11: qty 20

## 2022-06-11 MED ORDER — EZETIMIBE 10 MG PO TABS
10.0000 mg | ORAL_TABLET | Freq: Every day | ORAL | Status: DC
  Administered 2022-06-12: 10 mg via ORAL
  Filled 2022-06-11: qty 1

## 2022-06-11 MED ORDER — BRIMONIDINE TARTRATE 0.2 % OP SOLN
1.0000 [drp] | Freq: Two times a day (BID) | OPHTHALMIC | Status: DC
  Administered 2022-06-11 – 2022-06-12 (×2): 1 [drp] via OPHTHALMIC
  Filled 2022-06-11: qty 5

## 2022-06-11 MED ORDER — POLYVINYL ALCOHOL 1.4 % OP SOLN
1.0000 [drp] | Freq: Three times a day (TID) | OPHTHALMIC | Status: DC
  Administered 2022-06-11 – 2022-06-12 (×2): 1 [drp] via OPHTHALMIC
  Filled 2022-06-11: qty 15

## 2022-06-11 MED ORDER — ACETAMINOPHEN 650 MG RE SUPP: 650.0000 mg | Freq: Four times a day (QID) | RECTAL | Status: DC | PRN

## 2022-06-11 MED ORDER — ALBUTEROL SULFATE (2.5 MG/3ML) 0.083% IN NEBU: 2.5000 mg | INHALATION_SOLUTION | Freq: Four times a day (QID) | RESPIRATORY_TRACT | Status: DC | PRN

## 2022-06-11 MED ORDER — ENOXAPARIN SODIUM 40 MG/0.4ML IJ SOSY
40.0000 mg | PREFILLED_SYRINGE | INTRAMUSCULAR | Status: DC
Start: 2022-06-11 — End: 2022-06-13
  Administered 2022-06-11: 40 mg via SUBCUTANEOUS
  Filled 2022-06-11: qty 0.4

## 2022-06-11 MED ORDER — METOPROLOL SUCCINATE ER 100 MG PO TB24
100.0000 mg | ORAL_TABLET | Freq: Every day | ORAL | Status: DC
  Administered 2022-06-12: 100 mg via ORAL
  Filled 2022-06-11: qty 1

## 2022-06-11 MED ORDER — ASPIRIN 81 MG PO CHEW
81.0000 mg | CHEWABLE_TABLET | Freq: Every day | ORAL | Status: DC
  Administered 2022-06-12: 81 mg via ORAL
  Filled 2022-06-11: qty 1

## 2022-06-11 MED ORDER — SODIUM CHLORIDE 0.9 % IV SOLN: INTRAVENOUS | Status: AC

## 2022-06-11 MED ORDER — TIMOLOL MALEATE 0.5 % OP SOLN
1.0000 [drp] | Freq: Two times a day (BID) | OPHTHALMIC | Status: DC
  Administered 2022-06-11 – 2022-06-12 (×2): 1 [drp] via OPHTHALMIC
  Filled 2022-06-11: qty 5

## 2022-06-11 MED ORDER — HEPARIN BOLUS VIA INFUSION
4000.0000 [IU] | Freq: Once | INTRAVENOUS | Status: DC
  Filled 2022-06-11: qty 4000

## 2022-06-11 MED ORDER — ONDANSETRON HCL 4 MG PO TABS: 4.0000 mg | ORAL_TABLET | Freq: Four times a day (QID) | ORAL | Status: DC | PRN

## 2022-06-11 MED ORDER — ARTIFICIAL TEARS OP SOLN: 1.0000 [drp] | Freq: Three times a day (TID) | OPHTHALMIC | Status: DC

## 2022-06-11 MED ORDER — HEPARIN (PORCINE) 25000 UT/250ML-% IV SOLN: 1100.0000 [IU]/h | INTRAVENOUS | Status: DC

## 2022-06-11 MED ORDER — LATANOPROST 0.005 % OP SOLN
1.0000 [drp] | Freq: Every day | OPHTHALMIC | Status: DC
  Administered 2022-06-11: 1 [drp] via OPHTHALMIC
  Filled 2022-06-11: qty 2.5

## 2022-06-11 MED ORDER — ARTIFICIAL TEARS OPHTHALMIC OINT
1.0000 | TOPICAL_OINTMENT | Freq: Every day | OPHTHALMIC | Status: DC
  Administered 2022-06-11: 1 via OPHTHALMIC
  Filled 2022-06-11: qty 3.5

## 2022-06-11 MED ORDER — ASPIRIN 81 MG PO CHEW
324.0000 mg | CHEWABLE_TABLET | Freq: Once | ORAL | Status: AC
  Administered 2022-06-11: 324 mg via ORAL
  Filled 2022-06-11: qty 4

## 2022-06-11 MED ORDER — INSULIN ASPART 100 UNIT/ML IJ SOLN: 0.0000 [IU] | Freq: Three times a day (TID) | INTRAMUSCULAR | Status: DC

## 2022-06-11 NOTE — Assessment & Plan Note (Addendum)
87 year old male with history of HTN, HLD intolerant to statins who presents with 1 day of lower back pain, urinary frequency, cough and intermittent shortness of breath found to have covid-19. Denies any chest pain or palpitations, but troponin elevated to 343 with repeat at 419. Risk factors for cardiac disease include age, diabetes, HTN, HLD.  -obs to telemetry -no active chest pain, heparin initially ordered in ED, but cardiology asked to hold this. Discussed with cardiology and likely more demand ischemia. Continue to hold heparin.  -check echo -trend troponin to peak  -check lipid panel/A1C -continue beta blocker  -continue ASA  -f/u on cardiology recommendation

## 2022-06-11 NOTE — Plan of Care (Signed)
  Problem: Education: Goal: Knowledge of General Education information will improve Description: Including pain rating scale, medication(s)/side effects and non-pharmacologic comfort measures Outcome: Progressing   Problem: Health Behavior/Discharge Planning: Goal: Ability to manage health-related needs will improve Outcome: Progressing   Problem: Activity: Goal: Risk for activity intolerance will decrease Outcome: Progressing   Problem: Nutrition: Goal: Adequate nutrition will be maintained Outcome: Progressing   Problem: Coping: Goal: Level of anxiety will decrease Outcome: Progressing   Problem: Elimination: Goal: Will not experience complications related to bowel motility Outcome: Progressing Goal: Will not experience complications related to urinary retention Outcome: Progressing   Problem: Pain Managment: Goal: General experience of comfort will improve Outcome: Progressing   Problem: Safety: Goal: Ability to remain free from injury will improve Outcome: Progressing   Problem: Skin Integrity: Goal: Risk for impaired skin integrity will decrease Outcome: Progressing   Problem: Coping: Goal: Ability to adjust to condition or change in health will improve Outcome: Progressing   Problem: Nutritional: Goal: Maintenance of adequate nutrition will improve Outcome: Progressing Goal: Progress toward achieving an optimal weight will improve Outcome: Progressing   Problem: Skin Integrity: Goal: Risk for impaired skin integrity will decrease Outcome: Progressing   Problem: Tissue Perfusion: Goal: Adequacy of tissue perfusion will improve Outcome: Progressing   Problem: Respiratory: Goal: Will maintain a patent airway Outcome: Progressing Goal: Complications related to the disease process, condition or treatment will be avoided or minimized Outcome: Progressing

## 2022-06-11 NOTE — H&P (Addendum)
History and Physical    Patient: Steven Myers [id number] DOB: 1935-07-27 DOA: 06/11/2022 DOS: the patient was seen and examined on 06/11/2022 PCP: Seward Carol, MD  Patient coming from: Home - lives with his wife. Uses cane to get around.    Chief Complaint: back pain, cough, urinary frequency   HPI: Steven Myers is a 87 y.o. male with medical history significant of CKD stage 3, T2DM, HLD, HTN, venous stasis who presented for 2 day history  back pain and trouble walking, urinary frequency, cough and dyspnea. His daughter thought he was breathing harder and had a wheeze. His cough is dry and sometimes makes him gag. He has not been eating or drinking well for about one week. He states he has been short or breath at times.  He denies any sick contacts. His wife has been hospitalized for one day.    He has been feeling good. Denies any fever/chills, vision changes/headaches, chest pain or palpitations,abdominal pain, N/V/D, dysuria or leg swelling.    History of covid in 2021 and treated with remdesivir. Has had 4 vaccines.   He does not smoke or drink alcohol   ER Course:  vitals: afebrile, bp: 149/107, HR; 95, RR: 24, oxygen: 98%RA Pertinent labs: platelets 128, creatinine: 1.78 (1.3-1.4), bnp: 391, troponin 343>419       COVID POSITIVE  CXR: no acute process CT head: no acute finding  In ED: given ASA, started on heparin. Cardiology consulted. [facility] asked to admit.    Review of Systems: As mentioned in the history of present illness. All other systems reviewed and are negative. Past Medical History:  Diagnosis Date   Bilateral cataracts    CKD (chronic kidney disease) stage 3, GFR 30-59 ml/min (HCC)    DM (diabetes mellitus) type II controlled with renal manifestation (HCC)    Not on insulin   Essential hypertension    Glaucoma    Optho: Dr. Valetta Close   History of colonoscopy    Hyperlipidemia associated with type 2 diabetes mellitus ([place])    Intolerant to statins -->  because of elevated LFTs.   Obesity due to excess calories without serious comorbidity    BMI ~32 - mostly truncal obesity (mesomorphic)   Osteoarthritis of hip    Right > Left -- limits walking   RBBB (right bundle branch block)    Venous stasis dermatitis of both lower extremities - with edema    L leg worse than R 2/2 prior injury.; controlled somewhat with support hose   Past Surgical History:  Procedure Laterality Date   NM MYOVIEW LTD  04/2010   LEXISCAN: EF 62%. NO ISCHEMIA OR INFARCTION. LOW RISK. No RWMA    Social History:  reports that he quit smoking about 43 years ago. His smoking use included cigarettes. He has never used smokeless tobacco. He reports that he does not currently use alcohol. He reports that he does not use drugs.  No Known Allergies  Family History  Problem Relation Age of Onset   Diabetes Mother    Heart disease Father 63   Colon cancer Neg Hx     Prior to Admission medications   Medication Sig Start Date End Date Taking? Authorizing Provider  albuterol (VENTOLIN HFA) 108 (90 Base) MCG/ACT inhaler Inhale 2 puffs into the lungs every 4 (four) hours as needed for wheezing or shortness of breath. 11/23/19   [provider]  amLODipine (NORVASC) 5 MG tablet Take 5 mg by mouth daily. 04/03/18   [provider]  aspirin 81 MG tablet Take 81 mg by mouth daily.      [provider]  benzonatate (TESSALON) 100 MG capsule Take 1-2 capsules (100-200 mg total) by mouth 3 (three) times daily as needed. Patient taking differently: Take 100-200 mg by mouth 3 (three) times daily as needed for cough.  11/19/19   Wallis Bamberg, PA-C  brimonidine (ALPHAGAN) 0.15 % ophthalmic solution Place 1 drop into both eyes daily. 05/23/18   [provider]  dorzolamide-timolol (COSOPT) 22.3-6.8 MG/ML ophthalmic solution Place 1 drop into both eyes 2 (two) times daily.  05/27/18   [provider]  ezetimibe (ZETIA) 10 MG tablet Take 10 mg by mouth  daily. 03/21/18   [provider]  furosemide (LASIX) 20 MG tablet take 1 tablet by mouth twice a day Patient taking differently: Take 20 mg by mouth 2 (two) times daily.  07/08/12   Wendall Stade, MD  glimepiride (AMARYL) 4 MG tablet Take 4 mg by mouth daily before breakfast.    [provider]  LUMIGAN 0.01 % SOLN Place 1 drop into both eyes daily. 04/13/18   [provider]  metFORMIN (GLUCOPHAGE) 1000 MG tablet Take 1,000 mg by mouth 2 (two) times daily. 05/02/18   [provider]  metoprolol succinate (TOPROL-XL) 100 MG 24 hr tablet take 1 tablet by mouth once daily Patient taking differently: Take 100 mg by mouth daily.  05/28/12   Anders Simmonds, PA-C  promethazine-dextromethorphan (PROMETHAZINE-DM) 6.25-15 MG/5ML syrup Take 5 mLs by mouth at bedtime as needed for cough. 11/19/19   Wallis Bamberg, PA-C  trandolapril (MAVIK) 4 MG tablet take 1 tablet by mouth twice a day NEEDS OFFICE VISIT/LABS FOR REFILLS Patient taking differently: Take 4 mg by mouth 2 (two) times a day.  07/13/12   Dunn, Raymon Mutton, PA-C  Travoprost, BAK Free, (TRAVATAN) 0.004 % SOLN ophthalmic solution Place 1 drop into both eyes every evening. 01/28/21   [provider]    Physical Exam: Vitals:   06/11/22 1700 06/11/22 1715 06/11/22 1735 06/11/22 1802  BP:   (!) 141/97 (!) 147/98  Pulse: 85 85 84 87  Resp: (!) 34 15 (!) 22 20  Temp:   99.4 F (37.4 C) 99 F (37.2 C)  TempSrc:   Oral Oral  SpO2: 97% 96% 96% 97%  Weight:    83.9 kg  Height:    6' (1.829 m)   General:  Appears calm and comfortable and is in NAD Eyes:  PERRL, EOMI, normal lids, iris ENT:  grossly normal hearing, lips & tongue, mmm; appropriate dentition Neck:  no LAD, masses or thyromegaly; no carotid bruits, no JVD Cardiovascular:  RRR, no m/r/g. No LE edema.  Respiratory:   CTA bilaterally with no wheezes/rales/rhonchi.  Normal respiratory effort. Some dyspnea on talking.  Abdomen:  soft, NT, ND,  NABS Back:   normal alignment, no CVAT Skin:  no rash or induration seen on limited exam Musculoskeletal:  grossly normal tone BUE/BLE, good ROM, no bony abnormality Lower extremity:  No LE edema.  Limited foot exam with no ulcerations.  2+ distal pulses. Psychiatric:  grossly normal mood and affect, speech fluent and appropriate, AOx3 Neurologic:  CN 2-12 grossly intact, moves all extremities in coordinated fashion, sensation intact   Radiological Exams on Admission: Independently reviewed - see discussion in A/P where applicable  CT HEAD WO CONTRAST ( )  Result Date: 06/11/2022 CLINICAL DATA:  Mental status change.  Generalized weakness. EXAM: CT HEAD WITHOUT  CONTRAST TECHNIQUE: Contiguous axial images were obtained from the base of the skull through the vertex without intravenous contrast. RADIATION DOSE REDUCTION: This exam was performed according to the departmental dose-optimization program which includes automated exposure control, adjustment of the mA and/or kV according to patient size and/or use of iterative reconstruction technique. COMPARISON:  03/28/2019 FINDINGS: Brain: No evidence of acute infarction, hemorrhage, extra-axial collection, ventriculomegaly, or mass effect. Generalized cerebral atrophy. Periventricular white matter low attenuation likely secondary to microangiopathy. Vascular: Cerebrovascular atherosclerotic calcifications are noted. Skull: Negative for fracture or focal lesion. Sinuses/Orbits: Visualized portions of the orbits are unremarkable. Visualized portions of the paranasal sinuses are unremarkable. Visualized portions of the mastoid air cells are unremarkable. Other: None. IMPRESSION: 1. No acute intracranial findings. 2. Chronic small vessel ischemic changes and cerebral atrophy. Electronically Signed   By: Elige Ko M.D.   On: 06/11/2022 12:48   DG Chest 2 View  Result Date: 06/11/2022 CLINICAL DATA:  Generalized weakness. EXAM: CHEST - 2 VIEW COMPARISON:   February 09, 2021. FINDINGS: Stable cardiomediastinal silhouette. Both lungs are clear. The visualized skeletal structures are unremarkable. IMPRESSION: No active cardiopulmonary disease. Electronically Signed   By: Lupita Raider M.D.   On: 06/11/2022 12:45    EKG: Independently reviewed.  NSR with rate 95; nonspecific ST changes with no evidence of acute ischemia   Labs on Admission: I have personally reviewed the available labs and imaging studies at the time of the admission.  Pertinent labs:   platelets 128,  creatinine: 1.78 (1.3-1.4),  bnp: 391,  troponin 343>419        Assessment and Plan: Principal Problem:   NSTEMI vs. demand ischemia in setting of covid-19 Active Problems:   COVID-19   Acute renal failure superimposed on stage 3a chronic kidney disease (HCC)   Essential hypertension   DM (diabetes mellitus) type II controlled with renal manifestation (HCC)   Hyperlipidemia associated with type 2 diabetes mellitus (HCC)    Assessment and Plan: * NSTEMI vs. demand ischemia in setting of covid-[age] year old male with history of HTN, HLD intolerant to statins who presents with 1 day of lower back pain, urinary frequency, cough and intermittent shortness of breath found to have covid-19. Denies any chest pain or palpitations, but troponin elevated to 343 with repeat at 419. Risk factors for cardiac disease include age, diabetes, HTN, HLD.  -obs to telemetry -no active chest pain, heparin initially ordered in ED, but cardiology asked to hold this. Discussed with cardiology and likely more demand ischemia. Continue to hold heparin.  -check echo -trend troponin to peak  -check lipid panel/A1C -continue beta blocker  -continue ASA  -f/u on cardiology recommendation   COVID-19 History of covid infection in 2021 with remdesivir treatment  x4 covid vaccines  Respiratory droplet/contact precautions Check covid labs/inflammatory markers CXR clear and satting 94-96%RA Start  paxlovid, renally adjusted. Hold CCB Robitussin Prn Albuterol prn   Acute renal failure superimposed on stage 3a chronic kidney disease (HCC) Baseline creatinine of 1.3-1.4, up to 1.78 UA with ketones, no hgb Strict I/O Hold nephrotoxic drugs -ACEI and lasix  Gentle, time limited IVF x 7 hours Trend renal function    Essential hypertension Slightly elevated Hold norvasc while on paxlovid Continue home metoprolol Hold mavik with acute on chronic kidney failure  Prn hydralazine for elevated pressure  DM (diabetes mellitus) type II controlled with renal manifestation (HCC) Update A1C, last in chart from 2021 and was 6.6 Diet controlled  SSI and  accuchecks QAC/HS   Hyperlipidemia associated with type 2 diabetes mellitus ([place]) Continue zetia. Statin intolerant  Repeat fasting lipid panel in AM     Advance Care Planning:   Code Status: Full Code   Consults: cardiology   DVT Prophylaxis: lovenox   Family Communication: daughter at bedside: Dharmender Mcphee, son: Keigen Plaut   Severity of Illness: The appropriate patient status for this patient is OBSERVATION. Observation status is judged to be reasonable and necessary in order to provide the required intensity of service to ensure the patient's safety. The patient's presenting symptoms, physical exam findings, and initial radiographic and laboratory data in the context of their medical condition is felt to place them at decreased risk for further clinical deterioration. Furthermore, it is anticipated that the patient will be medically stable for discharge from the hospital within 2 midnights of admission.   Author: Orma Flaming, MD 06/11/2022 7:48 PM  For on call review [email or web address].

## 2022-06-11 NOTE — Assessment & Plan Note (Addendum)
Update A1C, last in chart from 2021 and was 6.6 Diet controlled  SSI and accuchecks QAC/HS

## 2022-06-11 NOTE — ED Notes (Signed)
Pt alert, NAD, calm, interactive, resps e/u, speaking in clear complete sentences, some increased wob when laughing and speaking. Family at [facility]. Denies pain or nausea. Pending bed clean 6E01.

## 2022-06-11 NOTE — ED Provider Notes (Signed)
[facility] EMERGENCY DEPARTMENT Provider Note   CSN: [id number] Arrival date & time: 06/11/22  1146     History  No chief complaint on file.   Steven Myers is a 87 y.o. male with history of high blood pressure, high cholesterol, diabetes, presented to ED with generalized weakness.  The patient is coming from home where he lives with his wife.  He reports that for the past 4 to 5 days he has had generalized weakness.  He describes pain in his arms and legs and in his back.  He said he had no strength to straighten up or even lift anything up yesterday.  He denies nausea, vomiting, chest pain, lightheadedness.  He presents by EMS, reports that the family was assisting to come for evaluation.  He typically does walk with a walker.  Patient's children report he has been sick for about 2 days. He had covid 2 years ago. He has had vaccinations for covid.  He reportedly does have a history of recurring UTIs sparse upon history [facility] by EMS  External records reviewed, the patient last had an echocardiogram in July 2019 which showed a normal EF of 60 to 65%.  HPI     Home Medications Prior to Admission medications   Medication Sig Start Date End Date Taking? Authorizing Provider  albuterol (VENTOLIN HFA) 108 (90 Base) MCG/ACT inhaler Inhale 2 puffs into the lungs every 4 (four) hours as needed for wheezing or shortness of breath. 11/23/19   [provider]  amLODipine (NORVASC) 5 MG tablet Take 5 mg by mouth daily. 04/03/18   [provider]  aspirin 81 MG tablet Take 81 mg by mouth daily.      [provider]  benzonatate (TESSALON) 100 MG capsule Take 1-2 capsules (100-200 mg total) by mouth 3 (three) times daily as needed. Patient taking differently: Take 100-200 mg by mouth 3 (three) times daily as needed for cough.  11/19/19   Jaynee Eagles, PA-C  brimonidine (ALPHAGAN) 0.15 % ophthalmic solution Place 1 drop into both eyes daily. 05/23/18    [provider]  dorzolamide-timolol (COSOPT) 22.3-6.8 MG/ML ophthalmic solution Place 1 drop into both eyes 2 (two) times daily.  05/27/18   [provider]  ezetimibe (ZETIA) 10 MG tablet Take 10 mg by mouth daily. 03/21/18   [provider]  furosemide (LASIX) 20 MG tablet take 1 tablet by mouth twice a day Patient taking differently: Take 20 mg by mouth 2 (two) times daily.  07/08/12   Josue Hector, MD  glimepiride (AMARYL) 4 MG tablet Take 4 mg by mouth daily before breakfast.    [provider]  LUMIGAN 0.01 % SOLN Place 1 drop into both eyes daily. 04/13/18   [provider]  metFORMIN (GLUCOPHAGE) 1000 MG tablet Take 1,000 mg by mouth 2 (two) times daily. 05/02/18   [provider]  metoprolol succinate (TOPROL-XL) 100 MG 24 hr tablet take 1 tablet by mouth once daily Patient taking differently: Take 100 mg by mouth daily.  05/28/12   Argentina Donovan, PA-C  promethazine-dextromethorphan (PROMETHAZINE-DM) 6.25-15 MG/5ML syrup Take 5 mLs by mouth at bedtime as needed for cough. 11/19/19   Jaynee Eagles, PA-C  trandolapril (MAVIK) 4 MG tablet take 1 tablet by mouth twice a day NEEDS OFFICE VISIT/LABS FOR REFILLS Patient taking differently: Take 4 mg by mouth 2 (two) times a day.  07/13/12   Dunn, Areta Haber, PA-C  Travoprost, BAK Free, (TRAVATAN) 0.004 %  SOLN ophthalmic solution Place 1 drop into both eyes every evening. 01/28/21   [provider]      Allergies    Patient has no known allergies.    Review of Systems   Review of Systems  Physical Exam Updated Vital Signs BP (!) 155/95   Pulse 83   Temp 98.5 F (36.9 C) (Oral)   Resp 19   Ht 6' (1.829 m)   Wt 83.9 kg   SpO2 99%   BMI 25.09 kg/m  Physical Exam Constitutional:      General: He is not in acute distress. HENT:     Head: Normocephalic and atraumatic.  Eyes:     Conjunctiva/sclera: Conjunctivae normal.     Pupils: Pupils are equal, round, and reactive to light.   Cardiovascular:     Rate and Rhythm: Normal rate and regular rhythm.  Pulmonary:     Effort: Pulmonary effort is normal. No respiratory distress.  Abdominal:     Tenderness: There is no abdominal tenderness.  Musculoskeletal:     Comments: 4/5 strength to bilateral hip flexion  Skin:    General: Skin is warm and dry.  Neurological:     General: No focal deficit present.     Mental Status: He is alert. Mental status is at baseline.     Sensory: No sensory deficit.     Motor: No weakness.  Psychiatric:        Mood and Affect: Mood normal.        Behavior: Behavior normal.     ED Results / Procedures / Treatments   Labs (all labs ordered are listed, but only abnormal results are displayed) Labs Reviewed  SARS CORONAVIRUS 2 BY RT PCR - Abnormal; Notable for the following components:      Result Value   SARS Coronavirus 2 by RT PCR POSITIVE (*)    All other components within normal limits  COMPREHENSIVE METABOLIC PANEL - Abnormal; Notable for the following components:   Glucose, Bld 122 (*)    Creatinine, Ser 1.78 (*)    GFR, Estimated 37 (*)    All other components within normal limits  CBC WITH DIFFERENTIAL/PLATELET - Abnormal; Notable for the following components:   Platelets 128 (*)    Lymphs Abs 0.4 (*)    All other components within normal limits  BRAIN NATRIURETIC PEPTIDE - Abnormal; Notable for the following components:   B Natriuretic Peptide 391.0 (*)    All other components within normal limits  URINALYSIS, ROUTINE W REFLEX MICROSCOPIC - Abnormal; Notable for the following components:   Hgb urine dipstick SMALL (*)    Ketones, ur 5 (*)    Protein, ur 100 (*)    All other components within normal limits  CBG MONITORING, ED - Abnormal; Notable for the following components:   Glucose-Capillary 118 (*)    All other components within normal limits  TROPONIN I (HIGH SENSITIVITY) - Abnormal; Notable for the following components:   Troponin I (High Sensitivity) 343  (*)    All other components within normal limits  TROPONIN I (HIGH SENSITIVITY) - Abnormal; Notable for the following components:   Troponin I (High Sensitivity) 419 (*)    All other components within normal limits  MAGNESIUM  C-REACTIVE PROTEIN  D-DIMER, QUANTITATIVE  FERRITIN  FIBRINOGEN  LACTATE DEHYDROGENASE  PROCALCITONIN  HEMOGLOBIN A1C  TROPONIN I (HIGH SENSITIVITY)    EKG EKG Interpretation  Date/Time:  Thursday June 11 2022 11:54:39 EDT Ventricular Rate:  95 PR  Interval:  187 QRS Duration: 137 QT Interval:  382 QTC Calculation: 481 R Axis:   -88 Text Interpretation: Sinus rhythm No sig chances from February 09 2021 tracing, waves noted on prior, RBBB unchanged Confirmed by Alvester Chou [phone]) on 06/11/2022 12:05:17 PM  Radiology CT HEAD WO CONTRAST ( )  Result Date: 06/11/2022 CLINICAL DATA:  Mental status change.  Generalized weakness. EXAM: CT HEAD WITHOUT CONTRAST TECHNIQUE: Contiguous axial images were obtained from the base of the skull through the vertex without intravenous contrast. RADIATION DOSE REDUCTION: This exam was performed according to the departmental dose-optimization program which includes automated exposure control, adjustment of the mA and/or kV according to patient size and/or use of iterative reconstruction technique. COMPARISON:  03/28/2019 FINDINGS: Brain: No evidence of acute infarction, hemorrhage, extra-axial collection, ventriculomegaly, or mass effect. Generalized cerebral atrophy. Periventricular white matter low attenuation likely secondary to microangiopathy. Vascular: Cerebrovascular atherosclerotic calcifications are noted. Skull: Negative for fracture or focal lesion. Sinuses/Orbits: Visualized portions of the orbits are unremarkable. Visualized portions of the paranasal sinuses are unremarkable. Visualized portions of the mastoid air cells are unremarkable. Other: None. IMPRESSION: 1. No acute intracranial findings. 2. Chronic small  vessel ischemic changes and cerebral atrophy. Electronically Signed   By: Elige Ko M.D.   On: 06/11/2022 12:48   DG Chest 2 View  Result Date: 06/11/2022 CLINICAL DATA:  Generalized weakness. EXAM: CHEST - 2 VIEW COMPARISON:  February 09, 2021. FINDINGS: Stable cardiomediastinal silhouette. Both lungs are clear. The visualized skeletal structures are unremarkable. IMPRESSION: No active cardiopulmonary disease. Electronically Signed   By: Lupita Raider M.D.   On: 06/11/2022 12:45    Procedures Procedures    Medications Ordered in ED Medications  insulin aspart (novoLOG) injection 0-9 Units (has no administration in time range)  nirmatrelvir/ritonavir EUA (renal dosing) (PAXLOVID) 1 tablet (has no administration in time range)  albuterol (PROVENTIL) (2.5 MG/3ML) 0.083% nebulizer solution 2.5 mg (has no administration in time range)  guaiFENesin-dextromethorphan (ROBITUSSIN DM) 100-10 MG/5ML syrup 10 mL (has no administration in time range)  0.9 %  sodium chloride infusion (has no administration in time range)  acetaminophen (TYLENOL) tablet 650 mg (has no administration in time range)    Or  acetaminophen (TYLENOL) suppository 650 mg (has no administration in time range)  ondansetron (ZOFRAN) tablet 4 mg (has no administration in time range)    Or  ondansetron (ZOFRAN) injection 4 mg (has no administration in time range)  aspirin chewable tablet 324 mg (324 mg Oral Given 06/11/22 1427)    ED Course/ Medical Decision Making/ A&P Clinical Course as of 06/11/22 1657  Thu Jun 11, 2022  1405 Cardiology paged regarding NSTEMI [MT]  1407 Aspirin, heparin ordered. [MT]  1409 Spoke to [employer] - they will consult [MT]  1421 SARS Coronavirus 2 by RT PCR(!): POSITIVE [MT]  1435 In the setting of COVID this is very likely demand ischemia related to his viral illness.  We will temporarily pause or hold the heparin until we have a repeat troponin level, if there is no significant  elevation is less likely an ongoing ACS. [MT]  1528 Admitted to hospitalist Dr Artis Flock - cardiology consult pending [MT]    Clinical Course User Index [MT] Renaye Rakers Kermit Balo, MD                           Medical Decision Making Amount and/or Complexity of Data Reviewed Labs: ordered. Decision-making details documented in  ED Course. Radiology: ordered.  Risk OTC drugs. Decision regarding hospitalization.   This patient presents to the ED with concern for generalized weakness. This involves an extensive number of treatment options, and is a complaint that carries with it a high risk of complications and morbidity.  The differential diagnosis includes dehydration versus electrolyte derangement versus blood sugar derangement versus anemia versus infection versus UTI versus CVA versus other  Co-morbidities that complicate the patient evaluation: Several cardiac risk factors including high blood pressure, diabetes, cholesterol, raise concern for typical and atypical ACS  Additional history obtained from EMS, patient's children at bedside  External records from outside source obtained and reviewed including echocardiogram report as noted above. Covid positive in 2020 (December)  I ordered and personally interpreted labs.  The pertinent results include:  trop 343 -> 419.  Covid positive.  No acute anemia.  WBC wnl.    I ordered imaging studies including CT scan of the head, x-ray of the chest I independently visualized and interpreted imaging which showed no emergent findings I agree with the radiologist interpretation  The patient was maintained on a cardiac monitor.  I personally viewed and interpreted the cardiac monitored which showed an underlying rhythm of: Normal sinus rhythm  Per my interpretation the patient's ECG shows chronic right bundle branch block, no significant changes from prior EKG in March 2022, no acute ischemic finding  I ordered medication including aspirin for  NSTEMI  I have reviewed the patients home medicines and have made adjustments as needed  Test Considered: doubt acute PE clinically at this time.  I requested consultation with the cardiology,  and discussed lab and imaging findings as well as pertinent plan - they recommend: consult pending  After the interventions noted above, I reevaluated the patient and found that they have: stayed the same   Dispostion:  After consideration of the diagnostic results and the patients response to treatment, I feel that the patent would benefit from medical admission..         Final Clinical Impression(s) / ED Diagnoses Final diagnoses:  COVID-19  NSTEMI (non-ST elevated myocardial infarction) [facility])    Rx / DC Orders ED Discharge Orders     None         Laverna Dossett, Kermit Balo, MD 06/11/22 1659

## 2022-06-11 NOTE — Assessment & Plan Note (Addendum)
Continue zetia. Statin intolerant  Repeat fasting lipid panel in AM

## 2022-06-11 NOTE — Assessment & Plan Note (Addendum)
Baseline creatinine of 1.3-1.4, up to 1.78 UA with ketones, no hgb Strict I/O Hold nephrotoxic drugs -ACEI and lasix  Gentle, time limited IVF x 7 hours Trend renal function

## 2022-06-11 NOTE — ED Triage Notes (Addendum)
Pt BIB [facility] coming from home. Pt had a fall by rolling off the bed today with no reports of injury.  EMS report pt has had generalized weakness x 2 days. Pt is A/Ox4. Pt with hx of frequent UTIs

## 2022-06-11 NOTE — Assessment & Plan Note (Addendum)
History of covid infection in 2021 with remdesivir treatment  x4 covid vaccines  Respiratory droplet/contact precautions Check covid labs/inflammatory markers CXR clear and satting 94-96%RA Start paxlovid, renally adjusted. Hold CCB Robitussin Prn Albuterol prn

## 2022-06-11 NOTE — Progress Notes (Signed)
ANTICOAGULATION CONSULT NOTE - Initial Consult  Pharmacy Consult for heparin Indication: chest pain/ACS  No Known Allergies  Patient Measurements: Height: 6' (182.9 cm) Weight: 83.9 kg (185 lb) IBW/kg (Calculated) : 77.6 Heparin Dosing Weight: TBW  Vital Signs: Temp: 98.5 F (36.9 C) (07/20 1152) Temp Source: Oral (07/20 1152) BP: 164/109 (07/20 1400) Pulse Rate: 87 (07/20 1400)  Labs: Recent Labs    06/11/22 1215  HGB 16.5  HCT 50.0  PLT 128*  CREATININE 1.78*  TROPONINIHS 343*    Estimated Creatinine Clearance: 33.9 mL/min (A) (by C-G formula based on SCr of 1.78 mg/dL (H)).   Medical History: Past Medical History:  Diagnosis Date   Bilateral cataracts    CKD (chronic kidney disease) stage 3, GFR 30-59 ml/min (HCC)    DM (diabetes mellitus) type II controlled with renal manifestation (HCC)    Not on insulin   Essential hypertension    Glaucoma    Optho: Dr. Cathey Endow   History of colonoscopy    Hyperlipidemia associated with type 2 diabetes mellitus (HCC)    Intolerant to statins --> because of elevated LFTs.   Obesity due to excess calories without serious comorbidity    BMI ~32 - mostly truncal obesity (mesomorphic)   Osteoarthritis of hip    Right > Left -- limits walking   RBBB (right bundle branch block)    Venous stasis dermatitis of both lower extremities - with edema    L leg worse than R 2/2 prior injury.; controlled somewhat with support hose    Assessment: 81 YOM presenting with fall from home, generalized weakness, elevated troponin.  He is not on anticoagulation PTA, CBC wnl.  Goal of Therapy:  Heparin level 0.3-0.7 units/ml Monitor platelets by anticoagulation protocol: Yes   Plan:  Heparin 4000 units IV x 1, and gtt at 1100 units/hr F/u 8 hour heparin level F/u cards eval and recs  Daylene Posey, PharmD Clinical Pharmacist ED Pharmacist Phone # [phone] 06/11/2022 2:29 PM

## 2022-06-11 NOTE — Assessment & Plan Note (Addendum)
Slightly elevated Hold norvasc while on paxlovid Continue home metoprolol Hold mavik with acute on chronic kidney failure  Prn hydralazine for elevated pressure

## 2022-06-12 ENCOUNTER — Other Ambulatory Visit: Payer: Self-pay | Admitting: Nurse Practitioner

## 2022-06-12 ENCOUNTER — Observation Stay (HOSPITAL_BASED_OUTPATIENT_CLINIC_OR_DEPARTMENT_OTHER): Payer: Medicare Other

## 2022-06-12 ENCOUNTER — Encounter (HOSPITAL_COMMUNITY): Payer: Self-pay | Admitting: Family Medicine

## 2022-06-12 DIAGNOSIS — I214 Non-ST elevation (NSTEMI) myocardial infarction: Secondary | ICD-10-CM | POA: Diagnosis not present

## 2022-06-12 DIAGNOSIS — N1831 Chronic kidney disease, stage 3a: Secondary | ICD-10-CM

## 2022-06-12 DIAGNOSIS — E1169 Type 2 diabetes mellitus with other specified complication: Secondary | ICD-10-CM

## 2022-06-12 DIAGNOSIS — I248 Other forms of acute ischemic heart disease: Secondary | ICD-10-CM

## 2022-06-12 DIAGNOSIS — I10 Essential (primary) hypertension: Secondary | ICD-10-CM | POA: Diagnosis not present

## 2022-06-12 DIAGNOSIS — N179 Acute kidney failure, unspecified: Secondary | ICD-10-CM

## 2022-06-12 DIAGNOSIS — E785 Hyperlipidemia, unspecified: Secondary | ICD-10-CM

## 2022-06-12 DIAGNOSIS — U071 COVID-19: Secondary | ICD-10-CM

## 2022-06-12 DIAGNOSIS — I429 Cardiomyopathy, unspecified: Secondary | ICD-10-CM

## 2022-06-12 DIAGNOSIS — E1122 Type 2 diabetes mellitus with diabetic chronic kidney disease: Secondary | ICD-10-CM

## 2022-06-12 LAB — ECHOCARDIOGRAM COMPLETE
Area-P 1/2: 4.06 cm2
Calc EF: 43.5 %
Height: 72 in
S' Lateral: 3.3 cm
Single Plane A2C EF: 45.6 %
Single Plane A4C EF: 45.9 %
Weight: 2959.46 oz

## 2022-06-12 LAB — COMPREHENSIVE METABOLIC PANEL
ALT: 15 U/L (ref 0–44)
AST: 45 U/L — ABNORMAL HIGH (ref 15–41)
Albumin: 3.2 g/dL — ABNORMAL LOW (ref 3.5–5.0)
Alkaline Phosphatase: 58 U/L (ref 38–126)
Anion gap: 9 (ref 5–15)
BUN: 16 mg/dL (ref 8–23)
CO2: 25 mmol/L (ref 22–32)
Calcium: 8.2 mg/dL — ABNORMAL LOW (ref 8.9–10.3)
Chloride: 106 mmol/L (ref 98–111)
Creatinine, Ser: 1.56 mg/dL — ABNORMAL HIGH (ref 0.61–1.24)
GFR, Estimated: 44 mL/min — ABNORMAL LOW (ref >60)
Glucose, Bld: 92 mg/dL (ref 70–99)
Potassium: 3.9 mmol/L (ref 3.5–5.1)
Sodium: 140 mmol/L (ref 135–145)
Total Bilirubin: 0.8 mg/dL (ref 0.3–1.2)
Total Protein: 6.6 g/dL (ref 6.5–8.1)

## 2022-06-12 LAB — LIPID PANEL
Cholesterol: 128 mg/dL (ref 0–200)
HDL: 47 mg/dL (ref >40)
LDL Cholesterol: 68 mg/dL (ref 0–99)
Total CHOL/HDL Ratio: 2.7 RATIO
Triglycerides: 63 mg/dL (ref <150)
VLDL: 13 mg/dL (ref 0–40)

## 2022-06-12 LAB — CBC WITH DIFFERENTIAL/PLATELET
Abs Immature Granulocytes: 0.01 10*3/uL (ref 0.00–0.07)
Basophils Absolute: 0 10*3/uL (ref 0.0–0.1)
Basophils Relative: 1 %
Eosinophils Absolute: 0 10*3/uL (ref 0.0–0.5)
Eosinophils Relative: 0 %
HCT: 48 % (ref 39.0–52.0)
Hemoglobin: 15.6 g/dL (ref 13.0–17.0)
Immature Granulocytes: 0 %
Lymphocytes Relative: 6 %
Lymphs Abs: 0.3 10*3/uL — ABNORMAL LOW (ref 0.7–4.0)
MCH: 28.3 pg (ref 26.0–34.0)
MCHC: 32.5 g/dL (ref 30.0–36.0)
MCV: 87 fL (ref 80.0–100.0)
Monocytes Absolute: 0.9 10*3/uL (ref 0.1–1.0)
Monocytes Relative: 19 %
Neutro Abs: 3.7 10*3/uL (ref 1.7–7.7)
Neutrophils Relative %: 74 %
Platelets: 115 10*3/uL — ABNORMAL LOW (ref 150–400)
RBC: 5.52 MIL/uL (ref 4.22–5.81)
RDW: 14.4 % (ref 11.5–15.5)
WBC: 5 10*3/uL (ref 4.0–10.5)
nRBC: 0 % (ref 0.0–0.2)

## 2022-06-12 LAB — GLUCOSE, CAPILLARY
Glucose-Capillary: 89 mg/dL (ref 70–99)
Glucose-Capillary: 96 mg/dL (ref 70–99)
Glucose-Capillary: 74 mg/dL (ref 70–99)

## 2022-06-12 MED ORDER — GUAIFENESIN-DM 100-10 MG/5ML PO SYRP: 10.0000 mL | ORAL_SOLUTION | ORAL | 0 refills | Status: DC | PRN

## 2022-06-12 MED ORDER — AMLODIPINE BESYLATE 5 MG PO TABS
5.0000 mg | ORAL_TABLET | Freq: Every day | ORAL | Status: DC
  Administered 2022-06-12: 5 mg via ORAL
  Filled 2022-06-12: qty 1

## 2022-06-12 MED ORDER — NIRMATRELVIR/RITONAVIR (PAXLOVID) TABLET (RENAL DOSING): 1.0000 | ORAL_TABLET | Freq: Two times a day (BID) | ORAL | 0 refills | Status: AC

## 2022-06-12 NOTE — Discharge Summary (Signed)
Physician Discharge Summary  Hamish Banks WGN:[id number] DOB: 1935/08/02 DOA: 06/11/2022  PCP: Renford Dills, MD  Admit date: 06/11/2022 Discharge date: 06/12/2022  Admitted From: Home Disposition: Home  Recommendations for Outpatient Follow-up:  Follow up with PCP in 1-2 weeks Please obtain BMP/CBC in one week Cardiology office will schedule follow-up.  Home Health: PT/OT/RN Equipment/Devices: None  Discharge Condition: Stable CODE STATUS: Full code Diet recommendation: Low-salt diet  Discharge summary: 87 year old gentleman with history of hypertension, hyperlipidemia, type 2 diabetes, CKD stage III, chronic right bundle branch block, venous stasis and obesity who was admitted to the hospital with URI symptoms, cough, nonspecific chest pain.  He was found to have COVID-19 positive results with no pneumonia and no hypoxemia, he was also found with elevated but flat troponins.  Was admitted to the hospital and treated symptomatically.  Cardiology was consulted.  Non-STEMI versus demand ischemia: Patient without active chest pain.  Elevated troponins but remained flat.  On aspirin and beta-blockers.  Echocardiogram showed new regional wall motion abnormality with depressed ejection fraction.  Patient was evaluated by cardiology, with his chronic kidney disease and active COVID-19 infection, ischemic evaluation will be arranged as outpatient.  Patient will have Lexiscan from cardiology office.  COVID-19 infection: On room air.  Constitutional symptoms.  Paxlovid for 5 days.  Chronic medical issues including hypertension, type 2 diabetes, hyperlipidemia are stable.  Patient was discharged home in a stable condition with plan for outpatient cardiology follow-up.   Discharge Diagnoses:  Principal Problem:   NSTEMI vs. demand ischemia in setting of covid-19 Active Problems:   COVID-19   Acute renal failure superimposed on stage 3a chronic kidney disease (HCC)   Essential  hypertension   DM (diabetes mellitus) type II controlled with renal manifestation (HCC)   Hyperlipidemia associated with type 2 diabetes mellitus [facility])    Discharge Instructions  Discharge Instructions     Call MD for:  difficulty breathing, headache or visual disturbances   Complete by: As directed    Call MD for:  temperature >100.4   Complete by: As directed    Diet - low sodium heart healthy   Complete by: As directed    Discharge instructions   Complete by: As directed    Can use over the counter cough medicines and tylenol as needed.   Increase activity slowly   Complete by: As directed       Allergies as of 06/12/2022   No Known Allergies      Medication List     TAKE these medications    albuterol 108 (90 Base) MCG/ACT inhaler Commonly known as: VENTOLIN HFA Inhale 2 puffs into the lungs every 4 (four) hours as needed for wheezing or shortness of breath.   amLODipine 5 MG tablet Commonly known as: NORVASC Take 5 mg by mouth daily.   artificial tears Oint ophthalmic ointment Commonly known as: LACRILUBE Place 1 Application into both eyes See admin instructions. Place as directed into both eyes as directed at bedtime- 5 minutes after Travoprost   Artificial Tears ophthalmic solution Place 1 drop into both eyes 3 (three) times daily.   aspirin 81 MG tablet Take 81 mg by mouth daily.   brimonidine-timolol 0.2-0.5 % ophthalmic solution Commonly known as: COMBIGAN Place 1 drop into both eyes in the morning and at bedtime.   ezetimibe 10 MG tablet Commonly known as: ZETIA Take 10 mg by mouth daily.   furosemide 20 MG tablet Commonly known as: LASIX take 1 tablet by mouth twice  a day What changed: when to take this   guaiFENesin-dextromethorphan 100-10 MG/5ML syrup Commonly known as: ROBITUSSIN DM Take 10 mLs by mouth every 4 (four) hours as needed for cough.   metoprolol succinate 100 MG 24 hr tablet Commonly known as: TOPROL-XL take 1 tablet by  mouth once daily   nirmatrelvir/ritonavir EUA (renal dosing) 10 x 150 MG & 10 x 100MG  Tabs Commonly known as: PAXLOVID Take 1 tablet by mouth 2 (two) times daily for 5 days. Patient GFR is 38. Take nirmatrelvir (150 mg) one tablet twice daily for 5 days and ritonavir (100 mg) one tablet twice daily for 5 days.   trandolapril 4 MG tablet Commonly known as: MAVIK take 1 tablet by mouth twice a day NEEDS OFFICE VISIT/LABS FOR REFILLS What changed: See the new instructions.   Travatan Z 0.004 % Soln ophthalmic solution Generic drug: Travoprost (BAK Free) Place 1 drop into both eyes at bedtime.        Follow-up Information     [facility] Follow up.   Why: Registered Nurse, Physical Therapy-Occupational Therapy-office to call with visit times. If you need to speak with someone please call [phone].               No Known Allergies  Consultations: Cardiology   Procedures/Studies: ECHOCARDIOGRAM COMPLETE  Result Date: 06/12/2022    ECHOCARDIOGRAM REPORT   Patient Name:   Steven Myers Date of Exam: 06/12/2022 Medical Rec #:  06/14/2022     Height:       72.0 in Accession #:    [id number]    Weight:       185.0 lb Date of Birth:  Apr 10, 1935     BSA:          2.061 m Patient Age:    87 years      BP:           149/97 mmHg Patient Gender: M             HR:           80 bpm. Exam Location:  Inpatient Procedure: 2D Echo, 3D Echo, Cardiac Doppler and Color Doppler Indications:    122-I22.9 Subsequent ST elevation (STEM) and non-ST elevation                 (NSTEMI) myocardial infarction  History:        Patient has prior history of Echocardiogram examinations, most                 recent 06/20/2018. Abnormal ECG, Arrythmias:RBBB,                 Signs/Symptoms:Shortness of Breath, Dyspnea and Edema; Risk                 Factors:Hypertension, Diabetes and Dyslipidemia. Covid positive.  Sonographer:    06/22/2018 RDCS Referring Phys: Sheralyn Boatman [facility]  Sonographer Comments:  Image acquisition challenging due to patient body habitus. IMPRESSIONS  1. Inferior basal hypokinesis. Left ventricular ejection fraction, by estimation, is 45 to 50%. The left ventricle has mildly decreased function. The left ventricle demonstrates regional wall motion abnormalities (see scoring diagram/findings for description). There is severe left ventricular hypertrophy. Left ventricular diastolic parameters are consistent with Grade I diastolic dysfunction (impaired relaxation).  2. Right ventricular systolic function is mildly reduced. The right ventricular size is mildly enlarged.  3. The mitral valve is abnormal. Trivial mitral valve regurgitation. No evidence of mitral stenosis.  4.  The aortic valve is tricuspid. There is mild calcification of the aortic valve. There is mild thickening of the aortic valve. Aortic valve regurgitation is not visualized. Aortic valve sclerosis is present, with no evidence of aortic valve stenosis.  5. Aortic dilatation noted. There is mild dilatation of the ascending aorta, measuring 39 mm.  6. The inferior vena cava is normal in size with greater than 50% respiratory variability, suggesting right atrial pressure of 3 mmHg. FINDINGS  Left Ventricle: Inferior basal hypokinesis. Left ventricular ejection fraction, by estimation, is 45 to 50%. The left ventricle has mildly decreased function. The left ventricle demonstrates regional wall motion abnormalities. The left ventricular internal cavity size was normal in size. There is severe left ventricular hypertrophy. Left ventricular diastolic parameters are consistent with Grade I diastolic dysfunction (impaired relaxation). Right Ventricle: The right ventricular size is mildly enlarged. No increase in right ventricular wall thickness. Right ventricular systolic function is mildly reduced. Left Atrium: Left atrial size was normal in size. Right Atrium: Right atrial size was normal in size. Pericardium: There is no evidence of  pericardial effusion. Mitral Valve: The mitral valve is abnormal. There is mild thickening of the mitral valve leaflet(s). There is mild calcification of the mitral valve leaflet(s). Trivial mitral valve regurgitation. No evidence of mitral valve stenosis. Tricuspid Valve: The tricuspid valve is normal in structure. Tricuspid valve regurgitation is trivial. No evidence of tricuspid stenosis. Aortic Valve: The aortic valve is tricuspid. There is mild calcification of the aortic valve. There is mild thickening of the aortic valve. Aortic valve regurgitation is not visualized. Aortic valve sclerosis is present, with no evidence of aortic valve stenosis. Pulmonic Valve: The pulmonic valve was normal in structure. Pulmonic valve regurgitation is not visualized. No evidence of pulmonic stenosis. Aorta: Aortic dilatation noted. There is mild dilatation of the ascending aorta, measuring 39 mm. Venous: The inferior vena cava is normal in size with greater than 50% respiratory variability, suggesting right atrial pressure of 3 mmHg. IAS/Shunts: No atrial level shunt detected by color flow Doppler.  LEFT VENTRICLE PLAX 2D LVIDd:         4.20 cm     Diastology LVIDs:         3.30 cm     LV e' medial:    2.61 cm/s LV PW:         1.80 cm     LV E/e' medial:  20.1 LV IVS:        1.90 cm     LV e' lateral:   4.12 cm/s LVOT diam:     2.40 cm     LV E/e' lateral: 12.7 LV SV:         75 LV SV Index:   36 LVOT Area:     4.52 cm                             3D Volume EF: LV Volumes (MOD)           3D EF:        47 % LV vol d, MOD A2C: 69.9 ml LV EDV:       102 ml LV vol d, MOD A4C: 73.7 ml LV ESV:       54 ml LV vol s, MOD A2C: 38.0 ml LV SV:        48 ml LV vol s, MOD A4C: 39.9 ml LV SV MOD A2C:  31.9 ml LV SV MOD A4C:     73.7 ml LV SV MOD BP:      31.5 ml RIGHT VENTRICLE             IVC RV S prime:     13.40 cm/s  IVC diam: 1.20 cm TAPSE (M-mode): 1.8 cm LEFT ATRIUM             Index        RIGHT ATRIUM           Index LA diam:         2.90 cm 1.41 cm/m   RA Area:     24.10 cm LA Vol (A2C):   46.6 ml 22.61 ml/m  RA Volume:   83.60 ml  40.56 ml/m LA Vol (A4C):   56.4 ml 27.37 ml/m LA Biplane Vol: 53.4 ml 25.91 ml/m  AORTIC VALVE             PULMONIC VALVE LVOT Vmax:   99.50 cm/s  PR End Diast Vel: 1.52 msec LVOT Vmean:  67.400 cm/s LVOT VTI:    0.165 m  AORTA Ao Root diam: 3.60 cm Ao Asc diam:  3.80 cm MITRAL VALVE MV Area (PHT): 4.06 cm    SHUNTS MV Decel Time: 187 msec    Systemic VTI:  0.16 m MV E velocity: 52.50 cm/s  Systemic Diam: 2.40 cm MV A velocity: 80.50 cm/s MV E/A ratio:  0.65 Jenkins Rouge MD Electronically signed by Jenkins Rouge MD Signature Date/Time: 06/12/2022/9:50:54 AM    Final    CT HEAD WO CONTRAST (5MM)  Result Date: 06/11/2022 CLINICAL DATA:  Mental status change.  Generalized weakness. EXAM: CT HEAD WITHOUT CONTRAST TECHNIQUE: Contiguous axial images were obtained from the base of the skull through the vertex without intravenous contrast. RADIATION DOSE REDUCTION: This exam was performed according to the departmental dose-optimization program which includes automated exposure control, adjustment of the mA and/or kV according to patient size and/or use of iterative reconstruction technique. COMPARISON:  03/28/2019 FINDINGS: Brain: No evidence of acute infarction, hemorrhage, extra-axial collection, ventriculomegaly, or mass effect. Generalized cerebral atrophy. Periventricular white matter low attenuation likely secondary to microangiopathy. Vascular: Cerebrovascular atherosclerotic calcifications are noted. Skull: Negative for fracture or focal lesion. Sinuses/Orbits: Visualized portions of the orbits are unremarkable. Visualized portions of the paranasal sinuses are unremarkable. Visualized portions of the mastoid air cells are unremarkable. Other: None. IMPRESSION: 1. No acute intracranial findings. 2. Chronic small vessel ischemic changes and cerebral atrophy. Electronically Signed   By: Kathreen Devoid M.D.    On: 06/11/2022 12:48   DG Chest 2 View  Result Date: 06/11/2022 CLINICAL DATA:  Generalized weakness. EXAM: CHEST - 2 VIEW COMPARISON:  February 09, 2021. FINDINGS: Stable cardiomediastinal silhouette. Both lungs are clear. The visualized skeletal structures are unremarkable. IMPRESSION: No active cardiopulmonary disease. Electronically Signed   By: Marijo Conception M.D.   On: 06/11/2022 12:45   (Echo, Carotid, EGD, Colonoscopy, ERCP)    Subjective: Examined patient in the morning rounds.  Evaluated before discharge.  Denies any complaints.  Has some weakness and worked with PT OT.  Dry cough.  Afebrile.   Discharge Exam: Vitals:   06/12/22 1055 06/12/22 1100  BP: (!) 156/103 (!) 156/103  Pulse: 75   Resp:  19  Temp:    SpO2:     Vitals:   06/12/22 0722 06/12/22 0834 06/12/22 1055 06/12/22 1100  BP: (!) 149/97 (!) 155/100 (!) 156/103 (!) 156/103  Pulse: 76  79 75   Resp: 16 16  19   Temp: 97.8 F (36.6 C) 98 F (36.7 C)    TempSrc:  Oral    SpO2: 97% 97%    Weight:      Height:        General: Pt is alert, awake, not in acute distress Cardiovascular: RRR, S1/S2 +, no rubs, no gallops Respiratory: CTA bilaterally, no wheezing, no rhonchi, slight conducted upper airway sounds. Abdominal: Soft, NT, ND, bowel sounds + Extremities: no edema, no cyanosis, chronic venous stasis skin changes.    The results of significant diagnostics from this hospitalization (including imaging, microbiology, ancillary and laboratory) are listed below for reference.     Microbiology: Recent Results (from the past 240 hour(s))  SARS Coronavirus 2 by RT PCR (hospital order, performed in [facility] hospital lab) *cepheid single result test*     Status: Abnormal   Collection Time: 06/11/22 12:15 PM   Specimen: Nasal Swab  Result Value Ref Range Status   SARS Coronavirus 2 by RT PCR POSITIVE (A) NEGATIVE Final    Comment: (NOTE) SARS-CoV-2 target nucleic acids are DETECTED  SARS-CoV-2 RNA is  generally detectable in upper respiratory specimens  during the acute phase of infection.  Positive results are indicative  of the presence of the identified virus, but do not rule out bacterial infection or co-infection with other pathogens not detected by the test.  Clinical correlation with patient history and  other diagnostic information is necessary to determine patient infection status.  The expected result is negative.  Fact Sheet for Patients:   https://www.patel.info/   Fact Sheet for Healthcare Providers:   https://hall.com/    This test is not yet approved or cleared by the [place] FDA and  has been authorized for detection and/or diagnosis of SARS-CoV-2 by FDA under an Emergency Use Authorization (EUA).  This EUA will remain in effect (meaning this test can be used) for the duration of  the COVID-19 declaration under Section 564(b)(1)  of the Act, 21 U.S.C. section 360-bbb-3(b)(1), unless the authorization is terminated or revoked sooner.   Performed at [facility], [place] [address], [place], [place] [address]      Labs: BNP (last 3 results) Recent Labs    06/11/22 1215  BNP [id number]*   Basic Metabolic Panel: Recent Labs  Lab 06/11/22 1215 06/12/22 0543  NA 143 140  K 4.2 3.9  CL 109 106  CO2 24 25  GLUCOSE 122* 92  BUN 16 16  CREATININE 1.78* 1.56*  CALCIUM 9.0 8.2*  MG 2.0  --    Liver Function Tests: Recent Labs  Lab 06/11/22 1215 06/12/22 0543  AST 37 45*  ALT 16 15  ALKPHOS 73 58  BILITOT 0.7 0.8  PROT 7.1 6.6  ALBUMIN 3.9 3.2*   No results for input(s): "LIPASE", "AMYLASE" in the last 168 hours. No results for input(s): "AMMONIA" in the last 168 hours. CBC: Recent Labs  Lab 06/11/22 1215 06/12/22 0543  WBC 5.8 5.0  NEUTROABS 4.4 3.7  HGB 16.5 15.6  HCT 50.0 48.0  MCV 86.5 87.0  PLT 128* 115*   Cardiac Enzymes: No results for input(s): "CKTOTAL", "CKMB", "CKMBINDEX",  "TROPONINI" in the last 168 hours. BNP: Invalid input(s): "POCBNP" CBG: Recent Labs  Lab 06/11/22 1814 06/11/22 2308 06/12/22 0830 06/12/22 1216 06/12/22 1639  GLUCAP 90 100* 89 96 74   D-Dimer Recent Labs    06/11/22 1700  DDIMER 0.71*   Hgb A1c Recent  Labs    06/11/22 1700  HGBA1C 6.6*   Lipid Profile Recent Labs    06/12/22 0543  CHOL 128  HDL 47  LDLCALC 68  TRIG 63  CHOLHDL 2.7   Thyroid function studies No results for input(s): "TSH", "T4TOTAL", "T3FREE", "THYROIDAB" in the last 72 hours.  Invalid input(s): "FREET3" Anemia work up Recent Labs    06/11/22 1700  FERRITIN 74   Urinalysis    Component Value Date/Time   COLORURINE YELLOW 06/11/2022 [place] 06/11/2022 1158   LABSPEC 1.010 06/11/2022 1158   PHURINE 7.0 06/11/2022 1158   GLUCOSEU NEGATIVE 06/11/2022 1158   HGBUR SMALL (A) 06/11/2022 1158   BILIRUBINUR NEGATIVE 06/11/2022 1158   KETONESUR 5 (A) 06/11/2022 1158   PROTEINUR 100 (A) 06/11/2022 1158   UROBILINOGEN 1.0 01/26/2012 2310   NITRITE NEGATIVE 06/11/2022 1158   LEUKOCYTESUR NEGATIVE 06/11/2022 1158   Sepsis Labs Recent Labs  Lab 06/11/22 1215 06/12/22 0543  WBC 5.8 5.0   Microbiology Recent Results (from the past 240 hour(s))  SARS Coronavirus 2 by RT PCR (hospital order, performed in [place] hospital lab) *cepheid single result test*     Status: Abnormal   Collection Time: 06/11/22 12:15 PM   Specimen: Nasal Swab  Result Value Ref Range Status   SARS Coronavirus 2 by RT PCR POSITIVE (A) NEGATIVE Final    Comment: (NOTE) SARS-CoV-2 target nucleic acids are DETECTED  SARS-CoV-2 RNA is generally detectable in upper respiratory specimens  during the acute phase of infection.  Positive results are indicative  of the presence of the identified virus, but do not rule out bacterial infection or co-infection with other pathogens not detected by the test.  Clinical correlation with patient history and   other diagnostic information is necessary to determine patient infection status.  The expected result is negative.  Fact Sheet for Patients:   https://www.patel.info/   Fact Sheet for Healthcare Providers:   https://hall.com/    This test is not yet approved or cleared by the [place] FDA and  has been authorized for detection and/or diagnosis of SARS-CoV-2 by FDA under an Emergency Use Authorization (EUA).  This EUA will remain in effect (meaning this test can be used) for the duration of  the COVID-19 declaration under Section 564(b)(1)  of the Act, 21 U.S.C. section 360-bbb-3(b)(1), unless the authorization is terminated or revoked sooner.   Performed at [facility], [place] [address] [place], [place] [address]      Time coordinating discharge: 32 minutes  SIGNED:   Barb Merino, MD  [facility] 06/12/2022, 5:32 PM

## 2022-06-12 NOTE — Consult Note (Addendum)
Cardiology Consult    Patient ID: Steven Myers MRN: [id number], DOB/AGE: [birth year]   Admit date: 06/11/2022 Date of Consult: 06/12/2022  Primary Physician: Seward Carol, MD Primary Cardiologist: None Requesting Provider: Raelyn Mora, MD  Patient Profile    Steven Myers is a 87 y.o. male with a history of HTN, HL, DMII, CKD III, RBBB, venous stasis, and obesity, who is being seen today for the evaluation of demand ischemia and cardiomyopathy at the request of Dr. Dr. Sloan Leiter.  Past Medical History   Past Medical History:  Diagnosis Date   Bilateral cataracts    Cardiomyopathy ([place])    a. 05/2018 Echo: EF 60-65%, no rwma, GrI DD. Mildly dil Ao root. Mild BAE; b. 05/2022 Echo: EF 45-50%, inferior basal HK. Sev LVH, GrI DD, mildly enlarged RV w/ mildly reduced fxn. Trivm MR. AoV sclerosis. Asc Ao 38mm.   Chest pain    a. 03/2007 MV: No isch/infarct; b. 04/2010 Lexi MV: EF 62%. No isch/infarct.   CKD (chronic kidney disease) stage 3, GFR 30-59 ml/min (HCC)    DM (diabetes mellitus) type II controlled with renal manifestation (HCC)    Not on insulin   Essential hypertension    Glaucoma    Optho: Dr. Valetta Close   History of colonoscopy    Hyperlipidemia associated with type 2 diabetes mellitus ([place])    Intolerant to statins --> because of elevated LFTs.   Obesity due to excess calories without serious comorbidity    BMI ~32 - mostly truncal obesity (mesomorphic)   Osteoarthritis of hip    Right > Left -- limits walking   RBBB (right bundle branch block)    Venous stasis dermatitis of both lower extremities - with edema    L leg worse than R 2/2 prior injury.; controlled somewhat with support hose    Past Surgical History:  Procedure Laterality Date   NM MYOVIEW LTD  04/2010   LEXISCAN: EF 62%. NO ISCHEMIA OR INFARCTION. LOW RISK. No RWMA     Allergies  No Known Allergies  History of Present Illness    87 y/o ? w /the above PMH including HTN, HL, DMII, CKD III, RBBB, venous  stasis, and obesity.  He also has a h/o chest pain w/ negative myoviews 03/2007 and 04/2010.  He was most recently evaluated in 05/2018, following an ED visit for chest pain.  Echo at that time showed nl EF  w/ mildly dil Ao root.  Coronary CTA was ordered, however pt did not follow through out of concern for his CKD and contrast usage.  Steven Myers lives locally w/ his wife.  He still drives and though he does not routinely exercise, he is able to do some house and yard work w/o significant symptoms or limitations.  He denies any recent history of chest pain.  Beginning late last week, he started noting some DOE and subsequently developed weakness and cough on 7/19.  He was having trouble getting up out of the chair and ambulating due to wkns.  As a result, his family brought him to the ED on 7/20, where ECG showed sinus rhythm @ 95, w/ RBBB.  Labs were notable for mild elevation of creat above prior known baseline (1.77), and HsTrops were elevated @ 343  419  398  416.  BNP was elevated @ 391.  CXR showed no active cardiopulmonary dzs.  COVID-19 swab returned positive and he was admitted and placed on paxlovid and nebs.  Echo performed this AM showed a  new reduction in EF to 45-50% w/ inferior basal HK, GrI DD, mildly reduced RV fxn, triv MR, Ao sclerosis, and dilated Asc Ao @ 47mm.  Currently, Steven Myers denies chest pain or dyspnea.  He cont to have an intermittent dry cough.   When I questioned him, he also denies having had any significant exertional chest tightness or pressure leading up to this event.  He did have some dyspnea right before his diagnosis, but not in the preceding week or 2. No signs symptoms of arrhythmia.  No rapid rate heartbeats or palpitations.  No syncope or near syncope.  Inpatient Medications     amLODipine  5 mg Oral Daily   artificial tears  1 Application Both Eyes QHS   aspirin  81 mg Oral Daily   brimonidine  1 drop Both Eyes BID   And   timolol  1 drop Both Eyes BID    enoxaparin (LOVENOX) injection  40 mg Subcutaneous Q24H   ezetimibe  10 mg Oral Daily   insulin aspart  0-9 Units Subcutaneous TID WC   latanoprost  1 drop Both Eyes QHS   metoprolol succinate  100 mg Oral Daily   nirmatrelvir/ritonavir EUA (renal dosing)  1 tablet Oral BID   polyvinyl alcohol  1 drop Both Eyes TID    Family History    Family History  Problem Relation Age of Onset   Diabetes Mother    Heart disease Father 54   Colon cancer Neg Hx    He indicated that his mother is deceased. He indicated that his father is deceased. He indicated that the status of his neg hx is unknown.   Social History    Social History   Socioeconomic History   Marital status: Married    Spouse name: Not on file   Number of children: 2   Years of education: Not on file   Highest education level: Not on file  Occupational History   Occupation: Paediatric nurse    Comment: self employed  Tobacco Use   Smoking status: Former    Types: Cigarettes    Quit date: 1980    Years since quitting: 43.5   Smokeless tobacco: Never   Tobacco comments:    quit 40 years ago  Vaping Use   Vaping Use: Never used  Substance and Sexual Activity   Alcohol use: Not Currently    Comment: quit over 20 yr ago   Drug use: No   Sexual activity: Not Currently  Other Topics Concern   Not on file  Social History Narrative   He is married to his wife -[name] ([place].   Accompanied by his daughter.   Still works as a Paediatric nurse    Slow decline in activity level - no routine exercise   Social Determinants of Corporate investment banker Strain: Not on file  Food Insecurity: Not on file  Transportation Needs: Not on file  Physical Activity: Not on file  Stress: Not on file  Social Connections: Not on file  Intimate Partner Violence: Not on file     Review of Systems    General:  No chills, fever, night sweats or weight changes.  Cardiovascular:  No chest pain, +++ dyspnea on exertion, +++ mild chronic L>R  LE edema, no orthopnea, palpitations, paroxysmal nocturnal dyspnea. Dermatological: No rash, lesions/masses Respiratory: +++ cough, +++ dyspnea Urologic: No hematuria, dysuria Abdominal:   No nausea, vomiting, diarrhea, bright red blood per rectum, melena, or hematemesis Neurologic:  No  visual changes, +++ wkns/malaise, changes in mental status. All other systems reviewed and are otherwise negative except as noted above.  Physical Exam    Blood pressure (!) 156/103, pulse 75, temperature 98 F (36.7 C), temperature source Oral, resp. rate 19, height 6' (1.829 m), weight 83.9 kg, SpO2 97 %.  General: Pleasant, NAD resting comfortable in bed.  Normal work of breathing. Psych: Normal affect. Neuro: Alert and oriented X 3. Moves all extremities spontaneously. HEENT: Normal  Neck: Supple without bruits or JVD. Lungs:  Resp regular and unlabored, scattered rhonchi, clear w/ cough. Heart: RRR, distant, no s3, s4, or murmurs. Abdomen: Soft, non-tender, non-distended, BS + x 4.  Extremities: No clubbing, cyanosis or trace L ankle edema. DP/PT2+, Radials 2+ and equal bilaterally.  Labs    Cardiac Enzymes Recent Labs  Lab 06/11/22 1215 06/11/22 1418 06/11/22 1700 06/11/22 1752  TROPONINIHS 343* 419* 398* 416*     BNP    Component Value Date/Time   BNP 391.0 (H) 06/11/2022 1215    Lab Results  Component Value Date   WBC 5.0 06/12/2022   HGB 15.6 06/12/2022   HCT 48.0 06/12/2022   MCV 87.0 06/12/2022   PLT 115 (L) 06/12/2022    Recent Labs  Lab 06/12/22 0543  NA 140  K 3.9  CL 106  CO2 25  BUN 16  CREATININE 1.56*  CALCIUM 8.2*  PROT 6.6  BILITOT 0.8  ALKPHOS 58  ALT 15  AST 45*  GLUCOSE 92   Lab Results  Component Value Date   CHOL 128 06/12/2022   HDL 47 06/12/2022   LDLCALC 68 06/12/2022   TRIG 63 06/12/2022   Lab Results  Component Value Date   DDIMER 0.71 (H) 06/11/2022    Lab Results  Component Value Date   HGBA1C 6.6 (H) 06/11/2022    Radiology  Studies    CT HEAD WO CONTRAST (5MM)  Result Date: 06/11/2022 CLINICAL DATA:  Mental status change.  Generalized weakness. EXAM: CT HEAD WITHOUT CONTRAST TECHNIQUE: Contiguous axial images were obtained from the base of the skull through the vertex without intravenous contrast. RADIATION DOSE REDUCTION: This exam was performed according to the departmental dose-optimization program which includes automated exposure control, adjustment of the mA and/or kV according to patient size and/or use of iterative reconstruction technique. COMPARISON:  03/28/2019 FINDINGS: Brain: No evidence of acute infarction, hemorrhage, extra-axial collection, ventriculomegaly, or mass effect. Generalized cerebral atrophy. Periventricular white matter low attenuation likely secondary to microangiopathy. Vascular: Cerebrovascular atherosclerotic calcifications are noted. Skull: Negative for fracture or focal lesion. Sinuses/Orbits: Visualized portions of the orbits are unremarkable. Visualized portions of the paranasal sinuses are unremarkable. Visualized portions of the mastoid air cells are unremarkable. Other: None. IMPRESSION: 1. No acute intracranial findings. 2. Chronic small vessel ischemic changes and cerebral atrophy. Electronically Signed   By: Kathreen Devoid M.D.   On: 06/11/2022 12:48   DG Chest 2 View  Result Date: 06/11/2022 CLINICAL DATA:  Generalized weakness. EXAM: CHEST - 2 VIEW COMPARISON:  February 09, 2021. FINDINGS: Stable cardiomediastinal silhouette. Both lungs are clear. The visualized skeletal structures are unremarkable. IMPRESSION: No active cardiopulmonary disease. Electronically Signed   By: Marijo Conception M.D.   On: 06/11/2022 12:45    ECG & Cardiac Imaging    RSR, 95, RBBB - personally reviewed.  Assessment & Plan    1.  Demand Ischemia/Cardiomyopathy:  pt w/ prior h/o chest pain and neg myoviews in 2008 and 2011.  He was last  eval in 2019 following ED eval for chest pain and cor CTA was ordered,  however pt did not follow through out of concern for CKD and contrast use.  He does reasonably well @ home w/o chest pain but over the past week, he has had DOE and weakness, and presented 7/20 and found to have COVID-19. In that setting, HsTrop 343  419  398  416.  He has not had chest pain.  Echo w/ new reduction in EF to 45-50% w/ inferior basal HK.  Creat relatively stable @ 1.56.  Suspect demand ischemia in the setting of resp infection.  He will require ischemic evaluation and cont to express a wish to avoid contrast.  In that setting, following recovery from COVID-19, we will arrange for outpt f/u and at that point can schedule a lexiscan myoview.  If high risk features are present, we will need to more strongly consider diagnostic catheterization.  Cont asa, ? blocker, and zetia therapy.  Home dose of ARB on hold.  Plan to resume provided renal fxn stable.  Also consider SGLT2i if renal fxn stable and not deemed to be cost-prohibitive.  Lasix currently held.  He does not appear to be volume overloaded at this time.  Agree, with no active chest pain symptoms and flat troponin elevation, no acute need for ischemic evaluation.  The reduced EF and wall motion normalities echo is however suggestive of possible CAD and therefore ischemic evaluation is warranted.  With his concerns about renal insufficiency, reasonable to perform outpatient stress test evaluation.  I would give him at least a week to recover from his COVID.  Would actually be reasonable to schedule an outpatient Lexiscan Myoview prior to being seen in the clinic for follow-up to discuss results.  This would save the visit.  If there is concerns of possible ischemia then would probably consider catheterization.  We can even consider staged PCI if necessary.  2.  COVID-19 infection:  Paxlovid per IM.  3.  Essential HTN:  Pressures elevated throughout admission.  He was on amlodipine 5mg  daily as well as trandolapril 4mg  daily @ home prior to  admission.  ACEI on hold in setting of mild creat elevation above baseline on admission.  Resume CCB - will need to watch bp given concomitant paxlovid rx.  4.  HL:  Cont zetia.  LDL 68.  5.  CKD III:  Creat relatively stable @ 1.56.  Follow.  Was on ARB @ home.  6.  DMII:  A1c 6.6 per IM.  7.  Chronic lymphedema: Stable w/ only trace RLE edema.  Periodically uses compression hose @ home.  Risk Assessment/Risk Scores:     Signed, , NP 06/12/2022, 5:39 PM   ATTENDING ATTESTATION  I have seen, examined and evaluated the patient this evening along with Bryan Lemma, NP.  After reviewing all the available data and chart, we discussed the patients laboratory, study & physical findings as well as symptoms in detail. I agree with his findings, examination as well as impression recommendations as per our discussion.    Attending adjustments noted in italics.  .  Patient presenting with weakness and dyspnea and fatigue found to be positive for COVID-19 which is probably the likely major etiology for her symptoms.  No complaints of chest pain or pressure, however is having some dyspnea.  Troponin elevation noted, but flat.  More concerning however is new regional wall motion [place] echo and reduced EF.  This does warrant ischemic evaluation.  Please see note above.  We will plan for outpatient Lexiscan Myoview in the [facility] office with D-Spect camera which allows for better imaging.  Otherwise agree with plan.     Glenetta Hew, M.D., M.S. Interventional Cardiologist   Pager # [phone] Phone # [phone] [address] [place], [place] [address]    For questions or updates, please contact   Please consult [email or web address] for contact info under Cardiology/STEMI.

## 2022-06-12 NOTE — Progress Notes (Signed)
  Echocardiogram 2D Echocardiogram has been performed.  Janalyn Harder 06/12/2022, 9:31 AM

## 2022-06-12 NOTE — Consult Note (Signed)
   [facility] Inpatient Consult   06/12/2022  Kelon Easom 1934/12/12 [id number]   [facility] [[facility]]  Accountable Care Organization [ACO] Patient:  Medicare ACO REACH  Primary Care Provider:  Renford Dills, MD,  Deboraha Sprang at [place] is an Independent embedded provider with a Chronic Care Management team and program, and is listed for the transition of care follow up and appointments.  Referral request for post hospital follow up needs. This provider is listed with [facility]. Review of encounter showed patient likely active with [facility].  Plan:  Call placed to [facility] Nurse and left a voicemail for return call for needs for post hospital needs. Awaiting a return call.  Please contact for further questions,  Charlesetta Shanks, RN BSN CCM [facility]  [phone] business mobile phone Toll free office [phone]  Fax number: [phone] [email or web address]

## 2022-06-12 NOTE — Care Management Obs Status (Signed)
MEDICARE OBSERVATION STATUS NOTIFICATION   Patient Details  Name: Steven Myers MRN: [id number] Date of Birth: [birth year]   Medicare Observation Status Notification Given:  Yes    Gala Lewandowsky, RN 06/12/2022, 11:01 AM

## 2022-06-12 NOTE — TOC Initial Note (Addendum)
Transition of Care [facility]) - Initial/Assessment Note    Patient Details  Name: Steven Myers MRN: [id number] Date of Birth: Feb 12, 1935  Transition of Care [facility]) CM/SW Contact:    Gala Lewandowsky, RN Phone Number: 06/12/2022, 11:12 AM  Clinical Narrative: Patient presented for back pain, cough, and urinary frequency. Case Manager spoke with patients spouse regarding disposition needs. PTA spouse states the patient is from home and he was having difficulty ambulating and had some recent falls. Patient has durable medical equipment cane and rolling walker. Spouse is open to home health and would like to use [facility] for services. Case Manager asked MD to place PT/OT consult for additional recommendations. Spouse states that the patient had [facility] in the home during April/May and felt like the patient would benefit from those services. Case Manager did reach out to [facility] Liaison and she is trying to reach out to an Radio broadcast assistant. Case Manager discussed if personal care services would be an option for the patient. Spouse states it is getting to that point. Spouse provided verbal permission to reach out to her son Ian Malkin to discuss. Case Manager discussed options and cost with son. Brochures will be left in the room for family to review to see if this is an option for ongoing care. Case Manager will continue to follow for additional transition of care needs.       06-12-22 1407 Case Manager received recommendation for [facility] Services- Referral submitted to [facility] for [facility]. [facility] can accept the referral. Office will call the patient within 24-48 hours post transition home for a visit. MD aware to place orders for PT/OT/RN. No further needs identified at this time.     Expected Discharge Plan: Home w Home Health Services Barriers to Discharge: Continued Medical Work up   Patient Goals and CMS Choice     Choice offered to / list presented to :  Spouse  Expected Discharge Plan and Services Expected Discharge Plan: Home w Home Health Services In-house Referral: [facility], [facility] is trying to reach out to [facility]) Discharge Planning Services: CM Consult Post Acute Care Choice: Home Health Living arrangements for the past 2 months: Single Family Home                           HH Arranged: RN, Disease Management, PT, OT, RN HH Agency: [facility]) (choice from spouse)        Prior Living Arrangements/Services Living arrangements for the past 2 months: Single Family Home Lives with:: Spouse Patient language and need for interpreter reviewed:: Yes Do you feel safe going back to the place where you live?: Yes      Need for Family Participation in Patient Care: Yes (Comment) Care giver support system in place?: Yes (comment) Current home services: DME (Patient has cane and rolling walker) Criminal Activity/Legal Involvement Pertinent to Current Situation/Hospitalization: No - Comment as needed  Activities of Daily Living      Permission Sought/Granted Permission sought to share information with : Family Supports, Case Production designer, theatre/television/film, Magazine features editor    Emotional Assessment Appearance:: Appears stated age       Alcohol / Substance Use: Not Applicable Psych Involvement: No (comment)  Admission diagnosis:  NSTEMI (non-ST elevated myocardial infarction) (HCC) [I21.4] COVID-19 [U07.1] Patient Active Problem List   Diagnosis Date Noted   Acute renal failure superimposed on stage 3a chronic kidney disease (HCC) 06/11/2022   NSTEMI vs. demand  ischemia in setting of covid-19 06/11/2022   COVID-19 11/27/2019   Generalized weakness 11/26/2019   COVID-19 virus infection 11/26/2019   Essential hypertension    DM (diabetes mellitus) type II controlled with renal manifestation (HCC)    Hyperlipidemia associated with type 2 diabetes mellitus (HCC)    Venous stasis dermatitis of both  lower extremities - with edema    DOE (dyspnea on exertion) 06/08/2018   Edema of both lower extremities 06/08/2018   RBBB 05/05/2010   Precordial chest pain 05/05/2010   GASTRITIS 10/14/2009   FECAL OCCULT BLOOD 09/09/2009   PCP:  Renford Dills, MD Pharmacy:   [facility] [phone] Ginette Otto, [place] - [address] AT [facility] [address] [place] [address] Phone: [phone] Fax: [phone]  Readmission Risk Interventions     No data to display

## 2022-06-12 NOTE — Progress Notes (Signed)
PROGRESS NOTE    Steven Myers  [id number] DOB: 10/17/1935 DOA: 06/11/2022 PCP: Seward Carol, MD    Brief Narrative:  87 year old gentleman with history of stage III CKD, type 2 diabetes, hypertension hyperlipidemia and venous stasis presents with 2 days history of back pain, trouble walking, urinary frequency cough and dyspnea.  In the emergency room afebrile.  On room air.  Slightly elevated creatinine.  Troponin 343-419.  COVID-19 positive.  Chest x-ray normal.  CT head normal.  EKG nonischemic.  Patient was given aspirin, started on heparin and admitted to the hospital with COVID-19 and elevated troponins.   Assessment & Plan:   Non-STEMI: Remains stable.  Without chest pain.  Mildly elevated troponins then remained flat. Initially started on heparin currently off. Possible demand ischemia. On aspirin and beta-blockers. Echocardiogram showed new regional wall motion abnormality with depressed ejection fraction.  Given new findings, cardiology consulted.  Patient currently remains fairly stable.  COVID-19 infection: With constitutional symptoms.  On room air. Droplet precautions. Since patient is on room air, symptomatic treatment with bronchodilator, mucolytic's and Paxlovid for 5 days.  No indication for steroids at this time.  AKI on CKD stage IIIa: Baseline creatinine 1.4.  Presented with 1.78.  Given gentle IV fluids overnight.  Already trending down.  Essential hypertension: Blood pressure fairly stable.  Type 2 diabetes: Well-controlled.  On diet.  Sliding scale insulin.  Hyperlipidemia: On Zetia.  Intolerant to statin.  Mobilize with PT OT.  Cardiology consultation pending.   DVT prophylaxis: enoxaparin (LOVENOX) injection 40 mg Start: 06/11/22 2200   Code Status: Full code Family Communication: None Disposition Plan: Status is: Observation The patient will require care spanning > 2 midnights and should be moved to inpatient because: Pending cardiology  evaluation, may need inpatient procedure due to new changes on his echocardiogram.     Consultants:  Cardiology  Procedures:  None  Antimicrobials:  Paxlovid 7/20--   Subjective: Patient seen and examined in the morning rounds.  He was getting echocardiogram.  He tells me he feels okay.  Afebrile overnight.  On room air.  Has some dry cough.  Denies any chest pain or shortness of breath.  Objective: Vitals:   06/12/22 0722 06/12/22 0834 06/12/22 1055 06/12/22 1100  BP: (!) 149/97 (!) 155/100 (!) 156/103 (!) 156/103  Pulse: 76 79 75   Resp: 16 16  19   Temp: 97.8 F (36.6 C) 98 F (36.7 C)    TempSrc:  Oral    SpO2: 97% 97%    Weight:      Height:        Intake/Output Summary (Last 24 hours) at 06/12/2022 1610 Last data filed at 06/11/2022 2230 Gross per 24 hour  Intake 506 ml  Output 300 ml  Net 206 ml   Filed Weights   06/11/22 1152 06/11/22 1802  Weight: 83.9 kg 83.9 kg    Examination:  General exam: Appears calm and comfortable  Alert oriented.  On room air. Respiratory system: Bilateral clear.  Some conducted upper airway sounds. Cardiovascular system: S1 & S2 heard, RRR.  Chronic venous stasis.  No pedal edema. Gastrointestinal system: Abdomen is nondistended, soft and nontender. No organomegaly or masses felt. Normal bowel sounds heard. Central nervous system: Alert and oriented. No focal neurological deficits. Extremities: Symmetric 5 x 5 power. Skin: No rashes, lesions or ulcers Psychiatry: Judgement and insight appear normal. Mood & affect appropriate.     Data Reviewed: I have personally reviewed following labs and imaging studies  CBC: Recent Labs  Lab 06/11/22 1215 06/12/22 0543  WBC 5.8 5.0  NEUTROABS 4.4 3.7  HGB 16.5 15.6  HCT 50.0 48.0  MCV 86.5 87.0  PLT 128* [id number]*   Basic Metabolic Panel: Recent Labs  Lab 06/11/22 1215 06/12/22 0543  NA 143 140  K 4.2 3.9  CL 109 106  CO2 24 25  GLUCOSE 122* 92  BUN 16 16  CREATININE  1.78* 1.56*  CALCIUM 9.0 8.2*  MG 2.0  --    GFR: Estimated Creatinine Clearance: 38.7 mL/min (A) (by C-G formula based on SCr of 1.56 mg/dL (H)). Liver Function Tests: Recent Labs  Lab 06/11/22 1215 06/12/22 0543  AST 37 45*  ALT 16 15  ALKPHOS 73 58  BILITOT 0.7 0.8  PROT 7.1 6.6  ALBUMIN 3.9 3.2*   No results for input(s): "LIPASE", "AMYLASE" in the last 168 hours. No results for input(s): "AMMONIA" in the last 168 hours. Coagulation Profile: No results for input(s): "INR", "PROTIME" in the last 168 hours. Cardiac Enzymes: No results for input(s): "CKTOTAL", "CKMB", "CKMBINDEX", "TROPONINI" in the last 168 hours. BNP (last 3 results) No results for input(s): "PROBNP" in the last 8760 hours. HbA1C: Recent Labs    06/11/22 1700  HGBA1C 6.6*   CBG: Recent Labs  Lab 06/11/22 1415 06/11/22 1814 06/11/22 2308 06/12/22 0830 06/12/22 1216  GLUCAP 118* 90 100* 89 96   Lipid Profile: Recent Labs    06/12/22 0543  CHOL 128  HDL 47  LDLCALC 68  TRIG 63  CHOLHDL 2.7   Thyroid Function Tests: No results for input(s): "TSH", "T4TOTAL", "FREET4", "T3FREE", "THYROIDAB" in the last 72 hours. Anemia Panel: Recent Labs    06/11/22 1700  FERRITIN 74   Sepsis Labs: Recent Labs  Lab 06/11/22 1700  PROCALCITON <0.10    Recent Results (from the past 240 hour(s))  SARS Coronavirus 2 by RT PCR (hospital order, performed in [facility] hospital lab) *cepheid single result test*     Status: Abnormal   Collection Time: 06/11/22 12:15 PM   Specimen: Nasal Swab  Result Value Ref Range Status   SARS Coronavirus 2 by RT PCR POSITIVE (A) NEGATIVE Final    Comment: (NOTE) SARS-CoV-2 target nucleic acids are DETECTED  SARS-CoV-2 RNA is generally detectable in upper respiratory specimens  during the acute phase of infection.  Positive results are indicative  of the presence of the identified virus, but do not rule out bacterial infection or co-infection with other  pathogens not detected by the test.  Clinical correlation with patient history and  other diagnostic information is necessary to determine patient infection status.  The expected result is negative.  Fact Sheet for Patients:   https://www.patel.info/   Fact Sheet for Healthcare Providers:   https://hall.com/    This test is not yet approved or cleared by the [place] FDA and  has been authorized for detection and/or diagnosis of SARS-CoV-2 by FDA under an Emergency Use Authorization (EUA).  This EUA will remain in effect (meaning this test can be used) for the duration of  the COVID-19 declaration under Section 564(b)(1)  of the Act, 21 U.S.C. section 360-bbb-3(b)(1), unless the authorization is terminated or revoked sooner.   Performed at [facility], [place] [address], [place], [place] [address]          Radiology Studies: ECHOCARDIOGRAM COMPLETE  Result Date: 06/12/2022    ECHOCARDIOGRAM REPORT   Patient Name:   KELDRIC SLAGHT Date of Exam: 06/12/2022 Medical  Rec #:  [id number]     Height:       72.0 in Accession #:    [id number]    Weight:       185.0 lb Date of Birth:  [birth year]     BSA:          2.061 m Patient Age:    86 years      BP:           149/97 mmHg Patient Gender: M             HR:           80 bpm. Exam Location:  Inpatient Procedure: 2D Echo, 3D Echo, Cardiac Doppler and Color Doppler Indications:    122-I22.9 Subsequent ST elevation (STEM) and non-ST elevation                 (NSTEMI) myocardial infarction  History:        Patient has prior history of Echocardiogram examinations, most                 recent 06/20/2018. Abnormal ECG, Arrythmias:RBBB,                 Signs/Symptoms:Shortness of Breath, Dyspnea and Edema; Risk                 Factors:Hypertension, Diabetes and Dyslipidemia. Covid positive.  Sonographer:    Sheralyn Boatman RDCS Referring Phys: [id number] [facility]  Sonographer Comments: Image acquisition  challenging due to patient body habitus. IMPRESSIONS  1. Inferior basal hypokinesis. Left ventricular ejection fraction, by estimation, is 45 to 50%. The left ventricle has mildly decreased function. The left ventricle demonstrates regional wall motion abnormalities (see scoring diagram/findings for description). There is severe left ventricular hypertrophy. Left ventricular diastolic parameters are consistent with Grade I diastolic dysfunction (impaired relaxation).  2. Right ventricular systolic function is mildly reduced. The right ventricular size is mildly enlarged.  3. The mitral valve is abnormal. Trivial mitral valve regurgitation. No evidence of mitral stenosis.  4. The aortic valve is tricuspid. There is mild calcification of the aortic valve. There is mild thickening of the aortic valve. Aortic valve regurgitation is not visualized. Aortic valve sclerosis is present, with no evidence of aortic valve stenosis.  5. Aortic dilatation noted. There is mild dilatation of the ascending aorta, measuring 39 mm.  6. The inferior vena cava is normal in size with greater than 50% respiratory variability, suggesting right atrial pressure of 3 mmHg. FINDINGS  Left Ventricle: Inferior basal hypokinesis. Left ventricular ejection fraction, by estimation, is 45 to 50%. The left ventricle has mildly decreased function. The left ventricle demonstrates regional wall motion abnormalities. The left ventricular internal cavity size was normal in size. There is severe left ventricular hypertrophy. Left ventricular diastolic parameters are consistent with Grade I diastolic dysfunction (impaired relaxation). Right Ventricle: The right ventricular size is mildly enlarged. No increase in right ventricular wall thickness. Right ventricular systolic function is mildly reduced. Left Atrium: Left atrial size was normal in size. Right Atrium: Right atrial size was normal in size. Pericardium: There is no evidence of pericardial effusion.  Mitral Valve: The mitral valve is abnormal. There is mild thickening of the mitral valve leaflet(s). There is mild calcification of the mitral valve leaflet(s). Trivial mitral valve regurgitation. No evidence of mitral valve stenosis. Tricuspid Valve: The tricuspid valve is normal in structure. Tricuspid valve regurgitation is trivial. No evidence of tricuspid stenosis. Aortic Valve: The aortic valve is  tricuspid. There is mild calcification of the aortic valve. There is mild thickening of the aortic valve. Aortic valve regurgitation is not visualized. Aortic valve sclerosis is present, with no evidence of aortic valve stenosis. Pulmonic Valve: The pulmonic valve was normal in structure. Pulmonic valve regurgitation is not visualized. No evidence of pulmonic stenosis. Aorta: Aortic dilatation noted. There is mild dilatation of the ascending aorta, measuring 39 mm. Venous: The inferior vena cava is normal in size with greater than 50% respiratory variability, suggesting right atrial pressure of 3 mmHg. IAS/Shunts: No atrial level shunt detected by color flow Doppler.  LEFT VENTRICLE PLAX 2D LVIDd:         4.20 cm     Diastology LVIDs:         3.30 cm     LV e' medial:    2.61 cm/s LV PW:         1.80 cm     LV E/e' medial:  20.1 LV IVS:        1.90 cm     LV e' lateral:   4.12 cm/s LVOT diam:     2.40 cm     LV E/e' lateral: 12.7 LV SV:         75 LV SV Index:   36 LVOT Area:     4.52 cm                             3D Volume EF: LV Volumes (MOD)           3D EF:        47 % LV vol d, MOD A2C: 69.9 ml LV EDV:       102 ml LV vol d, MOD A4C: 73.7 ml LV ESV:       54 ml LV vol s, MOD A2C: 38.0 ml LV SV:        48 ml LV vol s, MOD A4C: 39.9 ml LV SV MOD A2C:     31.9 ml LV SV MOD A4C:     73.7 ml LV SV MOD BP:      31.5 ml RIGHT VENTRICLE             IVC RV S prime:     13.40 cm/s  IVC diam: 1.20 cm TAPSE (M-mode): 1.8 cm LEFT ATRIUM             Index        RIGHT ATRIUM           Index LA diam:        2.90 cm 1.41  cm/m   RA Area:     24.10 cm LA Vol (A2C):   46.6 ml 22.61 ml/m  RA Volume:   83.60 ml  40.56 ml/m LA Vol (A4C):   56.4 ml 27.37 ml/m LA Biplane Vol: 53.4 ml 25.91 ml/m  AORTIC VALVE             PULMONIC VALVE LVOT Vmax:   99.50 cm/s  PR End Diast Vel: 1.52 msec LVOT Vmean:  67.400 cm/s LVOT VTI:    0.165 m  AORTA Ao Root diam: 3.60 cm Ao Asc diam:  3.80 cm MITRAL VALVE MV Area (PHT): 4.06 cm    SHUNTS MV Decel Time: 187 msec    Systemic VTI:  0.16 m MV E velocity: 52.50 cm/s  Systemic Diam: 2.40 cm MV A velocity: 80.50 cm/s MV E/A ratio:  0.65 Jenkins Rouge MD Electronically signed by Jenkins Rouge MD Signature Date/Time: 06/12/2022/9:50:54 AM    Final    CT HEAD WO CONTRAST (5MM)  Result Date: 06/11/2022 CLINICAL DATA:  Mental status change.  Generalized weakness. EXAM: CT HEAD WITHOUT CONTRAST TECHNIQUE: Contiguous axial images were obtained from the base of the skull through the vertex without intravenous contrast. RADIATION DOSE REDUCTION: This exam was performed according to the departmental dose-optimization program which includes automated exposure control, adjustment of the mA and/or kV according to patient size and/or use of iterative reconstruction technique. COMPARISON:  03/28/2019 FINDINGS: Brain: No evidence of acute infarction, hemorrhage, extra-axial collection, ventriculomegaly, or mass effect. Generalized cerebral atrophy. Periventricular white matter low attenuation likely secondary to microangiopathy. Vascular: Cerebrovascular atherosclerotic calcifications are noted. Skull: Negative for fracture or focal lesion. Sinuses/Orbits: Visualized portions of the orbits are unremarkable. Visualized portions of the paranasal sinuses are unremarkable. Visualized portions of the mastoid air cells are unremarkable. Other: None. IMPRESSION: 1. No acute intracranial findings. 2. Chronic small vessel ischemic changes and cerebral atrophy. Electronically Signed   By: Kathreen Devoid M.D.   On: 06/11/2022  12:48   DG Chest 2 View  Result Date: 06/11/2022 CLINICAL DATA:  Generalized weakness. EXAM: CHEST - 2 VIEW COMPARISON:  February 09, 2021. FINDINGS: Stable cardiomediastinal silhouette. Both lungs are clear. The visualized skeletal structures are unremarkable. IMPRESSION: No active cardiopulmonary disease. Electronically Signed   By: Marijo Conception M.D.   On: 06/11/2022 12:45        Scheduled Meds:  artificial tears  1 Application Both Eyes QHS   aspirin  81 mg Oral Daily   brimonidine  1 drop Both Eyes BID   And   timolol  1 drop Both Eyes BID   enoxaparin (LOVENOX) injection  40 mg Subcutaneous Q24H   ezetimibe  10 mg Oral Daily   insulin aspart  0-9 Units Subcutaneous TID WC   latanoprost  1 drop Both Eyes QHS   metoprolol succinate  100 mg Oral Daily   nirmatrelvir/ritonavir EUA (renal dosing)  1 tablet Oral BID   polyvinyl alcohol  1 drop Both Eyes TID   Continuous Infusions:   LOS: 0 days    Time spent: 35 minutes    Barb Merino, MD [facility] Pager [phone]

## 2022-06-12 NOTE — Evaluation (Signed)
Physical Therapy Evaluation Patient Details Name: Steven Myers MRN: [id number] DOB: 04/13/1935 Today's Date: 06/12/2022  History of Present Illness  Pt is an 87 y/o male admitted secondary to back back, difficulty walking, cough and SOB. Found to be covid +. PMH includes HTN, DM, and CKD.  Clinical Impression  Pt admitted secondary to problem above with deficits below. Pt requiring min to min guard A for mobility tasks this session. Reporting some weakness, but pt reports LE feel better than they did when he was admitted. Educated about using RW at home for increased safety. Recommending HHPT to address deficits. Will continue to follow acutely.        Recommendations for follow up therapy are one component of a multi-disciplinary discharge planning process, led by the attending physician.  Recommendations may be updated based on patient status, additional functional criteria and insurance authorization.  Follow Up Recommendations Home health PT      Assistance Recommended at Discharge Frequent or constant Supervision/Assistance  Patient can return home with the following  A little help with walking and/or transfers;A little help with bathing/dressing/bathroom;Assistance with cooking/housework;Help with stairs or ramp for entrance;Assist for transportation    Equipment Recommendations None recommended by PT  Recommendations for Other Services       Functional Status Assessment Patient has had a recent decline in their functional status and demonstrates the ability to make significant improvements in function in a reasonable and predictable amount of time.     Precautions / Restrictions Precautions Precautions: Fall Restrictions Weight Bearing Restrictions: No      Mobility  Bed Mobility Overal bed mobility: Needs Assistance Bed Mobility: Supine to Sit     Supine to sit: Min assist     General bed mobility comments: Min A for trunk elevation to come to sitting. Increased  time required and required use of bed rails.    Transfers Overall transfer level: Needs assistance Equipment used: Rolling walker (2 wheels) Transfers: Sit to/from Stand Sit to Stand: Min assist           General transfer comment: Min A for lift assist and steadying.    Ambulation/Gait Ambulation/Gait assistance: Min guard, Min assist Gait Distance (Feet): 40 Feet Assistive device: Rolling walker (2 wheels) Gait Pattern/deviations: Step-through pattern, Decreased stride length Gait velocity: Decreased     General Gait Details: Min guard to occasional min A for steadying during gait with RW. Cues for safe use of RW. Educated to use RW at home for increased safety.  Stairs            Wheelchair Mobility    Modified Rankin (Stroke Patients Only)       Balance Overall balance assessment: Needs assistance Sitting-balance support: No upper extremity supported, Feet supported Sitting balance-Leahy Scale: Fair     Standing balance support: Bilateral upper extremity supported Standing balance-Leahy Scale: Poor Standing balance comment: reliant on BUE support                             Pertinent Vitals/Pain Pain Assessment Pain Assessment: No/denies pain    Home Living Family/patient expects to be discharged to:: Private residence Living Arrangements: Spouse/significant other Available Help at Discharge: Family Type of Home: House Home Access: Stairs to enter Entrance Stairs-Rails: Right Entrance Stairs-Number of Steps: 3-4 Alternate Level Stairs-Number of Steps: flight Home Layout: Two level Home Equipment: Cane - single point;Shower seat;Grab bars - Chartered loss adjuster (2 wheels)  Prior Function Prior Level of Function : Independent/Modified Independent             Mobility Comments: Uses cane for ambulation ADLs Comments: Reports independence     Hand Dominance   Dominant Hand: Right    Extremity/Trunk Assessment    Upper Extremity Assessment Upper Extremity Assessment: Defer to OT evaluation    Lower Extremity Assessment Lower Extremity Assessment: Generalized weakness    Cervical / Trunk Assessment Cervical / Trunk Assessment: Normal  Communication   Communication: No difficulties  Cognition Arousal/Alertness: Awake/alert Behavior During Therapy: WFL for tasks assessed/performed Overall Cognitive Status: Impaired/Different from baseline Area of Impairment: Safety/judgement, Problem solving                         Safety/Judgement: Decreased awareness of deficits, Decreased awareness of safety   Problem Solving: Slow processing          General Comments      Exercises     Assessment/Plan    PT Assessment Patient needs continued PT services  PT Problem List Decreased strength;Decreased activity tolerance;Decreased balance;Decreased mobility;Decreased safety awareness;Decreased knowledge of precautions;Decreased knowledge of use of DME       PT Treatment Interventions DME instruction;Gait training;Stair training;Functional mobility training;Therapeutic activities;Therapeutic exercise;Balance training;Patient/family education    PT Goals (Current goals can be found in the Care Plan section)  Acute Rehab PT Goals Patient Stated Goal: to go home PT Goal Formulation: With patient Time For Goal Achievement: 06/26/22 Potential to Achieve Goals: Good    Frequency Min 3X/week     Co-evaluation               AM-PAC PT "6 Clicks" Mobility  Outcome Measure Help needed turning from your back to your side while in a flat bed without using bedrails?: A Little Help needed moving from lying on your back to sitting on the side of a flat bed without using bedrails?: A Little Help needed moving to and from a bed to a chair (including a wheelchair)?: A Little Help needed standing up from a chair using your arms (e.g., wheelchair or bedside chair)?: A Little Help needed to  walk in hospital room?: A Little Help needed climbing 3-5 steps with a railing? : A Lot 6 Click Score: 17    End of Session   Activity Tolerance: Patient tolerated treatment well Patient left: in chair;with nursing/sitter in room;Other (comment) (in chair at sink getting washup with NT present) Nurse Communication: Mobility status PT Visit Diagnosis: Unsteadiness on feet (R26.81);Muscle weakness (generalized) (M62.81)    Time: 2542-7062 PT Time Calculation (min) (ACUTE ONLY): 23 min   Charges:   PT Evaluation $PT Eval Moderate Complexity: 1 Mod PT Treatments $Gait Training: 8-22 mins        Cindee Salt, DPT  Acute Rehabilitation Services  Office: [phone]   Lehman Prom 06/12/2022, 12:43 PM

## 2022-06-12 NOTE — Evaluation (Signed)
Occupational Therapy Evaluation Patient Details Name: Steven Myers MRN: [id number] DOB: 04/21/1935 Today's Date: 06/12/2022   History of Present Illness Pt is an 87 y/o male admitted secondary to back back, difficulty walking, cough and SOB. Found to be covid +. PMH includes HTN, DM, and CKD.   Clinical Impression   Pt admitted for concerns listed above. PTA pt reported that he was independent with all ADL's and IADL's. At this time, pt presents with increased weakness and decreased activity tolerance. He is requiring min guard to min A for all ADL's and functional mobility. Recommending HHOT to maximize his independence and safety at home. OT will follow acutely.       Recommendations for follow up therapy are one component of a multi-disciplinary discharge planning process, led by the attending physician.  Recommendations may be updated based on patient status, additional functional criteria and insurance authorization.   Follow Up Recommendations  Home health OT    Assistance Recommended at Discharge Intermittent Supervision/Assistance  Patient can return home with the following A little help with walking and/or transfers;A little help with bathing/dressing/bathroom;Assistance with cooking/housework;Help with stairs or ramp for entrance    Functional Status Assessment  Patient has had a recent decline in their functional status and demonstrates the ability to make significant improvements in function in a reasonable and predictable amount of time.  Equipment Recommendations  None recommended by OT    Recommendations for Other Services       Precautions / Restrictions Precautions Precautions: Fall Restrictions Weight Bearing Restrictions: No      Mobility Bed Mobility Overal bed mobility: Needs Assistance Bed Mobility: Sit to Supine       Sit to supine: Supervision   General bed mobility comments: Supervision to return to supine no assist needed     Transfers Overall transfer level: Needs assistance Equipment used: Rolling walker (2 wheels) Transfers: Sit to/from Stand Sit to Stand: Min assist           General transfer comment: Min A for lift assist and steadying.      Balance Overall balance assessment: Needs assistance Sitting-balance support: No upper extremity supported, Feet supported Sitting balance-Leahy Scale: Good     Standing balance support: No upper extremity supported, During functional activity Standing balance-Leahy Scale: Fair Standing balance comment: sponge bathing at sink                           ADL either performed or assessed with clinical judgement   ADL Overall ADL's : Needs assistance/impaired                                       General ADL Comments: Min guard to min A for all ADL's due to weakness     Vision Baseline Vision/History: 1 Wears glasses Ability to See in Adequate Light: 0 Adequate Patient Visual Report: No change from baseline Vision Assessment?: No apparent visual deficits     Perception     Praxis      Pertinent Vitals/Pain Pain Assessment Pain Assessment: No/denies pain     Hand Dominance Right   Extremity/Trunk Assessment Upper Extremity Assessment Upper Extremity Assessment: Generalized weakness   Lower Extremity Assessment Lower Extremity Assessment: Defer to PT evaluation   Cervical / Trunk Assessment Cervical / Trunk Assessment: Normal   Communication Communication Communication: No difficulties   Cognition Arousal/Alertness:  Awake/alert Behavior During Therapy: WFL for tasks assessed/performed Overall Cognitive Status: Impaired/Different from baseline Area of Impairment: Safety/judgement, Problem solving                         Safety/Judgement: Decreased awareness of deficits, Decreased awareness of safety   Problem Solving: Slow processing       General Comments  VSS on RA    Exercises      Shoulder Instructions      Home Living Family/patient expects to be discharged to:: Private residence Living Arrangements: Spouse/significant other Available Help at Discharge: Family Type of Home: House Home Access: Stairs to enter Secretary/administrator of Steps: 3-4 Entrance Stairs-Rails: Right Home Layout: Two level Alternate Level Stairs-Number of Steps: flight Alternate Level Stairs-Rails: Right (L rail goes up half the way) Bathroom Shower/Tub: Chief Strategy Officer: Handicapped height     Home Equipment: Cane - single point;Shower seat;Grab bars - Chartered loss adjuster (2 wheels)          Prior Functioning/Environment Prior Level of Function : Independent/Modified Independent             Mobility Comments: Uses cane for ambulation ADLs Comments: Reports independence        OT Problem List: Decreased strength;Decreased activity tolerance;Impaired balance (sitting and/or standing);Decreased safety awareness;Cardiopulmonary status limiting activity      OT Treatment/Interventions:      OT Goals(Current goals can be found in the care plan section) Acute Rehab OT Goals Patient Stated Goal: To go home OT Goal Formulation: With patient Time For Goal Achievement: 06/26/22 Potential to Achieve Goals: Good  OT Frequency:      Co-evaluation              AM-PAC OT "6 Clicks" Daily Activity     Outcome Measure Help from another person eating meals?: A Little Help from another person taking care of personal grooming?: A Little Help from another person toileting, which includes using toliet, bedpan, or urinal?: A Little Help from another person bathing (including washing, rinsing, drying)?: A Little Help from another person to put on and taking off regular upper body clothing?: A Little Help from another person to put on and taking off regular lower body clothing?: A Little 6 Click Score: 18   End of Session Equipment Utilized During  Treatment: Rolling walker (2 wheels) Nurse Communication: Mobility status  Activity Tolerance: Patient tolerated treatment well Patient left: in bed;with call bell/phone within reach  OT Visit Diagnosis: Unsteadiness on feet (R26.81);Other abnormalities of gait and mobility (R26.89);Muscle weakness (generalized) (M62.81)                Time: 9628-3662 OT Time Calculation (min): 12 min Charges:  OT General Charges $OT Visit: 1 Visit OT Evaluation $OT Eval Moderate Complexity: 1 Mod  Gionni Vaca H., OTR/L Acute Rehabilitation  Jaymes Hang Elane Bing Plume 06/12/2022, 2:37 PM

## 2022-06-14 DIAGNOSIS — E785 Hyperlipidemia, unspecified: Secondary | ICD-10-CM | POA: Diagnosis not present

## 2022-06-14 DIAGNOSIS — Z9181 History of falling: Secondary | ICD-10-CM | POA: Diagnosis not present

## 2022-06-14 DIAGNOSIS — I4510 Unspecified right bundle-branch block: Secondary | ICD-10-CM | POA: Diagnosis not present

## 2022-06-14 DIAGNOSIS — Z7982 Long term (current) use of aspirin: Secondary | ICD-10-CM | POA: Diagnosis not present

## 2022-06-14 DIAGNOSIS — E1136 Type 2 diabetes mellitus with diabetic cataract: Secondary | ICD-10-CM | POA: Diagnosis not present

## 2022-06-14 DIAGNOSIS — E1122 Type 2 diabetes mellitus with diabetic chronic kidney disease: Secondary | ICD-10-CM | POA: Diagnosis not present

## 2022-06-14 DIAGNOSIS — R350 Frequency of micturition: Secondary | ICD-10-CM | POA: Diagnosis not present

## 2022-06-14 DIAGNOSIS — H409 Unspecified glaucoma: Secondary | ICD-10-CM | POA: Diagnosis not present

## 2022-06-14 DIAGNOSIS — I872 Venous insufficiency (chronic) (peripheral): Secondary | ICD-10-CM | POA: Diagnosis not present

## 2022-06-14 DIAGNOSIS — U071 COVID-19: Secondary | ICD-10-CM | POA: Diagnosis not present

## 2022-06-14 DIAGNOSIS — E1169 Type 2 diabetes mellitus with other specified complication: Secondary | ICD-10-CM | POA: Diagnosis not present

## 2022-06-14 DIAGNOSIS — I214 Non-ST elevation (NSTEMI) myocardial infarction: Secondary | ICD-10-CM | POA: Diagnosis not present

## 2022-06-14 DIAGNOSIS — M160 Bilateral primary osteoarthritis of hip: Secondary | ICD-10-CM | POA: Diagnosis not present

## 2022-06-14 DIAGNOSIS — I10 Essential (primary) hypertension: Secondary | ICD-10-CM | POA: Diagnosis not present

## 2022-06-14 DIAGNOSIS — Z87891 Personal history of nicotine dependence: Secondary | ICD-10-CM | POA: Diagnosis not present

## 2022-06-14 DIAGNOSIS — N1831 Chronic kidney disease, stage 3a: Secondary | ICD-10-CM | POA: Diagnosis not present

## 2022-06-15 DIAGNOSIS — I10 Essential (primary) hypertension: Secondary | ICD-10-CM | POA: Diagnosis not present

## 2022-06-15 DIAGNOSIS — N1831 Chronic kidney disease, stage 3a: Secondary | ICD-10-CM | POA: Diagnosis not present

## 2022-06-15 DIAGNOSIS — E1136 Type 2 diabetes mellitus with diabetic cataract: Secondary | ICD-10-CM | POA: Diagnosis not present

## 2022-06-15 DIAGNOSIS — U071 COVID-19: Secondary | ICD-10-CM | POA: Diagnosis not present

## 2022-06-15 DIAGNOSIS — I214 Non-ST elevation (NSTEMI) myocardial infarction: Secondary | ICD-10-CM | POA: Diagnosis not present

## 2022-06-15 DIAGNOSIS — E1122 Type 2 diabetes mellitus with diabetic chronic kidney disease: Secondary | ICD-10-CM | POA: Diagnosis not present

## 2022-06-16 ENCOUNTER — Encounter (HOSPITAL_COMMUNITY): Payer: Self-pay | Admitting: Nurse Practitioner

## 2022-06-16 DIAGNOSIS — I214 Non-ST elevation (NSTEMI) myocardial infarction: Secondary | ICD-10-CM | POA: Diagnosis not present

## 2022-06-16 DIAGNOSIS — U071 COVID-19: Secondary | ICD-10-CM | POA: Diagnosis not present

## 2022-06-16 DIAGNOSIS — I10 Essential (primary) hypertension: Secondary | ICD-10-CM | POA: Diagnosis not present

## 2022-06-16 DIAGNOSIS — N1831 Chronic kidney disease, stage 3a: Secondary | ICD-10-CM | POA: Diagnosis not present

## 2022-06-16 DIAGNOSIS — E1136 Type 2 diabetes mellitus with diabetic cataract: Secondary | ICD-10-CM | POA: Diagnosis not present

## 2022-06-16 DIAGNOSIS — E1122 Type 2 diabetes mellitus with diabetic chronic kidney disease: Secondary | ICD-10-CM | POA: Diagnosis not present

## 2022-06-18 DIAGNOSIS — E1122 Type 2 diabetes mellitus with diabetic chronic kidney disease: Secondary | ICD-10-CM | POA: Diagnosis not present

## 2022-06-18 DIAGNOSIS — N1831 Chronic kidney disease, stage 3a: Secondary | ICD-10-CM | POA: Diagnosis not present

## 2022-06-18 DIAGNOSIS — E1136 Type 2 diabetes mellitus with diabetic cataract: Secondary | ICD-10-CM | POA: Diagnosis not present

## 2022-06-18 DIAGNOSIS — I214 Non-ST elevation (NSTEMI) myocardial infarction: Secondary | ICD-10-CM | POA: Diagnosis not present

## 2022-06-18 DIAGNOSIS — U071 COVID-19: Secondary | ICD-10-CM | POA: Diagnosis not present

## 2022-06-18 DIAGNOSIS — I10 Essential (primary) hypertension: Secondary | ICD-10-CM | POA: Diagnosis not present

## 2022-06-19 ENCOUNTER — Telehealth: Payer: Self-pay | Admitting: Cardiology

## 2022-06-19 DIAGNOSIS — I509 Heart failure, unspecified: Secondary | ICD-10-CM | POA: Diagnosis not present

## 2022-06-19 DIAGNOSIS — N1831 Chronic kidney disease, stage 3a: Secondary | ICD-10-CM | POA: Diagnosis not present

## 2022-06-19 DIAGNOSIS — E1122 Type 2 diabetes mellitus with diabetic chronic kidney disease: Secondary | ICD-10-CM | POA: Diagnosis not present

## 2022-06-19 DIAGNOSIS — E7800 Pure hypercholesterolemia, unspecified: Secondary | ICD-10-CM | POA: Diagnosis not present

## 2022-06-19 DIAGNOSIS — I10 Essential (primary) hypertension: Secondary | ICD-10-CM | POA: Diagnosis not present

## 2022-06-19 NOTE — Telephone Encounter (Signed)
Spoke to patient's daughter Lupita Leash she wanted to know more about the Nuclear Stress Test father is scheduled to have done 8/22.Stated he has kidney disease and she wants to know more about this test.Advised I spoke to nuclear tech and she will call back with information today.

## 2022-06-19 NOTE — Telephone Encounter (Signed)
Patient's daughter would like to know more about her father's stress test appointment and what it involves Please advise.

## 2022-06-22 DIAGNOSIS — U071 COVID-19: Secondary | ICD-10-CM | POA: Diagnosis not present

## 2022-06-22 DIAGNOSIS — N1831 Chronic kidney disease, stage 3a: Secondary | ICD-10-CM | POA: Diagnosis not present

## 2022-06-22 DIAGNOSIS — I214 Non-ST elevation (NSTEMI) myocardial infarction: Secondary | ICD-10-CM | POA: Diagnosis not present

## 2022-06-22 DIAGNOSIS — E1122 Type 2 diabetes mellitus with diabetic chronic kidney disease: Secondary | ICD-10-CM | POA: Diagnosis not present

## 2022-06-22 DIAGNOSIS — E1136 Type 2 diabetes mellitus with diabetic cataract: Secondary | ICD-10-CM | POA: Diagnosis not present

## 2022-06-22 DIAGNOSIS — I10 Essential (primary) hypertension: Secondary | ICD-10-CM | POA: Diagnosis not present

## 2022-06-23 DIAGNOSIS — N1832 Chronic kidney disease, stage 3b: Secondary | ICD-10-CM | POA: Diagnosis not present

## 2022-06-23 DIAGNOSIS — I509 Heart failure, unspecified: Secondary | ICD-10-CM | POA: Diagnosis not present

## 2022-06-23 DIAGNOSIS — I214 Non-ST elevation (NSTEMI) myocardial infarction: Secondary | ICD-10-CM | POA: Diagnosis not present

## 2022-06-23 DIAGNOSIS — Z8616 Personal history of COVID-19: Secondary | ICD-10-CM | POA: Diagnosis not present

## 2022-06-24 ENCOUNTER — Ambulatory Visit: Payer: Medicare Other | Admitting: Podiatry

## 2022-06-26 ENCOUNTER — Ambulatory Visit: Payer: Medicare Other | Admitting: General Practice

## 2022-06-26 DIAGNOSIS — I214 Non-ST elevation (NSTEMI) myocardial infarction: Secondary | ICD-10-CM | POA: Diagnosis not present

## 2022-06-26 DIAGNOSIS — N1831 Chronic kidney disease, stage 3a: Secondary | ICD-10-CM | POA: Diagnosis not present

## 2022-06-26 DIAGNOSIS — E1122 Type 2 diabetes mellitus with diabetic chronic kidney disease: Secondary | ICD-10-CM | POA: Diagnosis not present

## 2022-06-26 DIAGNOSIS — I10 Essential (primary) hypertension: Secondary | ICD-10-CM | POA: Diagnosis not present

## 2022-06-26 DIAGNOSIS — E1136 Type 2 diabetes mellitus with diabetic cataract: Secondary | ICD-10-CM | POA: Diagnosis not present

## 2022-06-26 DIAGNOSIS — U071 COVID-19: Secondary | ICD-10-CM | POA: Diagnosis not present

## 2022-06-29 DIAGNOSIS — U071 COVID-19: Secondary | ICD-10-CM | POA: Diagnosis not present

## 2022-06-29 DIAGNOSIS — E1122 Type 2 diabetes mellitus with diabetic chronic kidney disease: Secondary | ICD-10-CM | POA: Diagnosis not present

## 2022-06-29 DIAGNOSIS — N1831 Chronic kidney disease, stage 3a: Secondary | ICD-10-CM | POA: Diagnosis not present

## 2022-06-29 DIAGNOSIS — I214 Non-ST elevation (NSTEMI) myocardial infarction: Secondary | ICD-10-CM | POA: Diagnosis not present

## 2022-06-29 DIAGNOSIS — I10 Essential (primary) hypertension: Secondary | ICD-10-CM | POA: Diagnosis not present

## 2022-06-29 DIAGNOSIS — E1136 Type 2 diabetes mellitus with diabetic cataract: Secondary | ICD-10-CM | POA: Diagnosis not present

## 2022-07-01 DIAGNOSIS — U071 COVID-19: Secondary | ICD-10-CM | POA: Diagnosis not present

## 2022-07-01 DIAGNOSIS — I10 Essential (primary) hypertension: Secondary | ICD-10-CM | POA: Diagnosis not present

## 2022-07-01 DIAGNOSIS — E1122 Type 2 diabetes mellitus with diabetic chronic kidney disease: Secondary | ICD-10-CM | POA: Diagnosis not present

## 2022-07-01 DIAGNOSIS — E1136 Type 2 diabetes mellitus with diabetic cataract: Secondary | ICD-10-CM | POA: Diagnosis not present

## 2022-07-01 DIAGNOSIS — N1831 Chronic kidney disease, stage 3a: Secondary | ICD-10-CM | POA: Diagnosis not present

## 2022-07-01 DIAGNOSIS — I214 Non-ST elevation (NSTEMI) myocardial infarction: Secondary | ICD-10-CM | POA: Diagnosis not present

## 2022-07-03 ENCOUNTER — Ambulatory Visit (INDEPENDENT_AMBULATORY_CARE_PROVIDER_SITE_OTHER): Payer: Medicare Other | Admitting: Podiatry

## 2022-07-03 DIAGNOSIS — M79674 Pain in right toe(s): Secondary | ICD-10-CM

## 2022-07-03 DIAGNOSIS — M79675 Pain in left toe(s): Secondary | ICD-10-CM

## 2022-07-03 DIAGNOSIS — B351 Tinea unguium: Secondary | ICD-10-CM | POA: Diagnosis not present

## 2022-07-03 DIAGNOSIS — E1122 Type 2 diabetes mellitus with diabetic chronic kidney disease: Secondary | ICD-10-CM | POA: Diagnosis not present

## 2022-07-06 DIAGNOSIS — E1136 Type 2 diabetes mellitus with diabetic cataract: Secondary | ICD-10-CM | POA: Diagnosis not present

## 2022-07-06 DIAGNOSIS — E1122 Type 2 diabetes mellitus with diabetic chronic kidney disease: Secondary | ICD-10-CM | POA: Diagnosis not present

## 2022-07-06 DIAGNOSIS — I214 Non-ST elevation (NSTEMI) myocardial infarction: Secondary | ICD-10-CM | POA: Diagnosis not present

## 2022-07-06 DIAGNOSIS — U071 COVID-19: Secondary | ICD-10-CM | POA: Diagnosis not present

## 2022-07-06 DIAGNOSIS — N1831 Chronic kidney disease, stage 3a: Secondary | ICD-10-CM | POA: Diagnosis not present

## 2022-07-06 DIAGNOSIS — I10 Essential (primary) hypertension: Secondary | ICD-10-CM | POA: Diagnosis not present

## 2022-07-07 DIAGNOSIS — N1831 Chronic kidney disease, stage 3a: Secondary | ICD-10-CM | POA: Diagnosis not present

## 2022-07-07 DIAGNOSIS — I214 Non-ST elevation (NSTEMI) myocardial infarction: Secondary | ICD-10-CM | POA: Diagnosis not present

## 2022-07-07 DIAGNOSIS — E1136 Type 2 diabetes mellitus with diabetic cataract: Secondary | ICD-10-CM | POA: Diagnosis not present

## 2022-07-07 DIAGNOSIS — E1122 Type 2 diabetes mellitus with diabetic chronic kidney disease: Secondary | ICD-10-CM | POA: Diagnosis not present

## 2022-07-07 DIAGNOSIS — I10 Essential (primary) hypertension: Secondary | ICD-10-CM | POA: Diagnosis not present

## 2022-07-07 DIAGNOSIS — U071 COVID-19: Secondary | ICD-10-CM | POA: Diagnosis not present

## 2022-07-08 ENCOUNTER — Telehealth: Payer: Self-pay

## 2022-07-08 DIAGNOSIS — N1831 Chronic kidney disease, stage 3a: Secondary | ICD-10-CM | POA: Diagnosis not present

## 2022-07-08 DIAGNOSIS — U071 COVID-19: Secondary | ICD-10-CM | POA: Diagnosis not present

## 2022-07-08 DIAGNOSIS — I10 Essential (primary) hypertension: Secondary | ICD-10-CM | POA: Diagnosis not present

## 2022-07-08 DIAGNOSIS — E1136 Type 2 diabetes mellitus with diabetic cataract: Secondary | ICD-10-CM | POA: Diagnosis not present

## 2022-07-08 DIAGNOSIS — E1122 Type 2 diabetes mellitus with diabetic chronic kidney disease: Secondary | ICD-10-CM | POA: Diagnosis not present

## 2022-07-08 DIAGNOSIS — I214 Non-ST elevation (NSTEMI) myocardial infarction: Secondary | ICD-10-CM | POA: Diagnosis not present

## 2022-07-08 NOTE — Telephone Encounter (Signed)
Detailed instructions left on the patient's answering machine. Asked to call back with any questions. S.Durwin Davisson EMTP 

## 2022-07-10 NOTE — Progress Notes (Signed)
  Subjective:  Patient ID: Steven Myers, male    DOB: 1934/12/17,  MRN: [id number]  Chief Complaint  Patient presents with   Nail Problem   87 y.o. male returns for the above complaint.  Patient presents with thickened elongated dystrophic toenails x10 mild pain on palpation.  Hurts with ambulation like to have a degree is not able to do it himself.  Patient is a diabetic  Objective:  There were no vitals filed for this visit. Podiatric Exam: Vascular: dorsalis pedis and posterior tibial pulses are palpable bilateral. Capillary return is immediate. Temperature gradient is WNL. Skin turgor WNL  Sensorium: Normal Semmes Weinstein monofilament test. Normal tactile sensation bilaterally. Nail Exam: Pt has thick disfigured discolored nails with subungual debris noted bilateral entire nail hallux through fifth toenails.  Pain on palpation to the nails. Ulcer Exam: There is no evidence of ulcer or pre-ulcerative changes or infection. Orthopedic Exam: Muscle tone and strength are WNL. No limitations in general ROM. No crepitus or effusions noted.  Skin: No Porokeratosis. No infection or ulcers    Assessment & Plan:   1. Pain due to onychomycosis of toenails of both feet   2. Controlled type 2 diabetes mellitus with chronic kidney disease, without long-term current use of insulin, unspecified CKD stage [facility])     Patient was evaluated and treated and all questions answered.  Onychomycosis with pain  -Nails palliatively debrided as below. -Educated on self-care  Procedure: Nail Debridement Rationale: pain  Type of Debridement: manual, sharp debridement. Instrumentation: Nail nipper, rotary burr. Number of Nails: 10  Procedures and Treatment: Consent by patient was obtained for treatment procedures. The patient understood the discussion of treatment and procedures well. All questions were answered thoroughly reviewed. Debridement of mycotic and hypertrophic toenails, 1 through 5 bilateral  and clearing of subungual debris. No ulceration, no infection noted.  Return Visit-Office Procedure: Patient instructed to return to the office for a follow up visit 3 months for continued evaluation and treatment.  Nicholes Rough, DPM    Return in about 3 months (around 10/03/2022).

## 2022-07-14 ENCOUNTER — Ambulatory Visit (HOSPITAL_COMMUNITY): Payer: Medicare Other | Attending: Cardiology

## 2022-07-14 DIAGNOSIS — Z87891 Personal history of nicotine dependence: Secondary | ICD-10-CM | POA: Diagnosis not present

## 2022-07-14 DIAGNOSIS — U071 COVID-19: Secondary | ICD-10-CM | POA: Diagnosis not present

## 2022-07-14 DIAGNOSIS — E1169 Type 2 diabetes mellitus with other specified complication: Secondary | ICD-10-CM | POA: Diagnosis not present

## 2022-07-14 DIAGNOSIS — I214 Non-ST elevation (NSTEMI) myocardial infarction: Secondary | ICD-10-CM | POA: Diagnosis not present

## 2022-07-14 DIAGNOSIS — I429 Cardiomyopathy, unspecified: Secondary | ICD-10-CM | POA: Insufficient documentation

## 2022-07-14 DIAGNOSIS — R350 Frequency of micturition: Secondary | ICD-10-CM | POA: Diagnosis not present

## 2022-07-14 DIAGNOSIS — I248 Other forms of acute ischemic heart disease: Secondary | ICD-10-CM | POA: Insufficient documentation

## 2022-07-14 DIAGNOSIS — E785 Hyperlipidemia, unspecified: Secondary | ICD-10-CM | POA: Diagnosis not present

## 2022-07-14 DIAGNOSIS — I10 Essential (primary) hypertension: Secondary | ICD-10-CM | POA: Diagnosis not present

## 2022-07-14 DIAGNOSIS — E1122 Type 2 diabetes mellitus with diabetic chronic kidney disease: Secondary | ICD-10-CM | POA: Diagnosis not present

## 2022-07-14 DIAGNOSIS — I872 Venous insufficiency (chronic) (peripheral): Secondary | ICD-10-CM | POA: Diagnosis not present

## 2022-07-14 DIAGNOSIS — H409 Unspecified glaucoma: Secondary | ICD-10-CM | POA: Diagnosis not present

## 2022-07-14 DIAGNOSIS — N1831 Chronic kidney disease, stage 3a: Secondary | ICD-10-CM | POA: Diagnosis not present

## 2022-07-14 DIAGNOSIS — Z9181 History of falling: Secondary | ICD-10-CM | POA: Diagnosis not present

## 2022-07-14 DIAGNOSIS — Z7982 Long term (current) use of aspirin: Secondary | ICD-10-CM | POA: Diagnosis not present

## 2022-07-14 DIAGNOSIS — I4510 Unspecified right bundle-branch block: Secondary | ICD-10-CM | POA: Diagnosis not present

## 2022-07-14 DIAGNOSIS — E1136 Type 2 diabetes mellitus with diabetic cataract: Secondary | ICD-10-CM | POA: Diagnosis not present

## 2022-07-14 DIAGNOSIS — M160 Bilateral primary osteoarthritis of hip: Secondary | ICD-10-CM | POA: Diagnosis not present

## 2022-07-14 LAB — MYOCARDIAL PERFUSION IMAGING
LV dias vol: 120 mL (ref 62–150)
LV sys vol: 61 mL
Nuc Stress EF: 49 %
Peak HR: 90 {beats}/min
Rest HR: 65 {beats}/min
Rest Nuclear Isotope Dose: 10.6 mCi
SDS: 1
SRS: 0
SSS: 1
ST Depression (mm): 0 mm
Stress Nuclear Isotope Dose: 31.1 mCi
TID: 0.97

## 2022-07-14 MED ORDER — TECHNETIUM TC 99M TETROFOSMIN IV KIT
31.1000 | PACK | Freq: Once | INTRAVENOUS | Status: AC | PRN
  Administered 2022-07-14: 31.1 via INTRAVENOUS

## 2022-07-14 MED ORDER — REGADENOSON 0.4 MG/5ML IV SOLN
0.4000 mg | Freq: Once | INTRAVENOUS | Status: AC
  Administered 2022-07-14: 0.4 mg via INTRAVENOUS

## 2022-07-14 MED ORDER — TECHNETIUM TC 99M TETROFOSMIN IV KIT
10.6000 | PACK | Freq: Once | INTRAVENOUS | Status: AC | PRN
  Administered 2022-07-14: 10.6 via INTRAVENOUS

## 2022-07-15 DIAGNOSIS — U071 COVID-19: Secondary | ICD-10-CM | POA: Diagnosis not present

## 2022-07-15 DIAGNOSIS — N1831 Chronic kidney disease, stage 3a: Secondary | ICD-10-CM | POA: Diagnosis not present

## 2022-07-15 DIAGNOSIS — E1136 Type 2 diabetes mellitus with diabetic cataract: Secondary | ICD-10-CM | POA: Diagnosis not present

## 2022-07-15 DIAGNOSIS — I214 Non-ST elevation (NSTEMI) myocardial infarction: Secondary | ICD-10-CM | POA: Diagnosis not present

## 2022-07-15 DIAGNOSIS — E1122 Type 2 diabetes mellitus with diabetic chronic kidney disease: Secondary | ICD-10-CM | POA: Diagnosis not present

## 2022-07-15 DIAGNOSIS — I10 Essential (primary) hypertension: Secondary | ICD-10-CM | POA: Diagnosis not present

## 2022-07-17 DIAGNOSIS — E1122 Type 2 diabetes mellitus with diabetic chronic kidney disease: Secondary | ICD-10-CM | POA: Diagnosis not present

## 2022-07-17 DIAGNOSIS — E1136 Type 2 diabetes mellitus with diabetic cataract: Secondary | ICD-10-CM | POA: Diagnosis not present

## 2022-07-17 DIAGNOSIS — I214 Non-ST elevation (NSTEMI) myocardial infarction: Secondary | ICD-10-CM | POA: Diagnosis not present

## 2022-07-17 DIAGNOSIS — N1831 Chronic kidney disease, stage 3a: Secondary | ICD-10-CM | POA: Diagnosis not present

## 2022-07-17 DIAGNOSIS — I10 Essential (primary) hypertension: Secondary | ICD-10-CM | POA: Diagnosis not present

## 2022-07-17 DIAGNOSIS — U071 COVID-19: Secondary | ICD-10-CM | POA: Diagnosis not present

## 2022-07-21 NOTE — Progress Notes (Unsigned)
Cardiology Clinic Note   Patient Name: Steven Myers Date of Encounter: 07/23/2022  Primary Care Provider:  Renford Dills, MD Primary Cardiologist:  Bryan Lemma, MD  Patient Profile    Steven Myers 87 year old male presents the clinic today for follow-up post stress testing.  Past Medical History    Past Medical History:  Diagnosis Date   Bilateral cataracts    Cardiomyopathy (HCC)    a. 05/2018 Echo: EF 60-65%, no rwma, GrI DD. Mildly dil Ao root. Mild BAE; b. 05/2022 Echo: EF 45-50%, inferior basal HK. Sev LVH, GrI DD, mildly enlarged RV w/ mildly reduced fxn. Trivm MR. AoV sclerosis. Asc Ao 56mm.   Chest pain    a. 03/2007 MV: No isch/infarct; b. 04/2010 Lexi MV: EF 62%. No isch/infarct.   CKD (chronic kidney disease) stage 3, GFR 30-59 ml/min (HCC)    DM (diabetes mellitus) type II controlled with renal manifestation (HCC)    Not on insulin   Essential hypertension    Glaucoma    Optho: Dr. Cathey Endow   History of colonoscopy    Hyperlipidemia associated with type 2 diabetes mellitus (HCC)    Intolerant to statins --> because of elevated LFTs.   Obesity due to excess calories without serious comorbidity    BMI ~32 - mostly truncal obesity (mesomorphic)   Osteoarthritis of hip    Right > Left -- limits walking   RBBB (right bundle branch block)    Venous stasis dermatitis of both lower extremities - with edema    L leg worse than R 2/2 prior injury.; controlled somewhat with support hose   Past Surgical History:  Procedure Laterality Date   NM MYOVIEW LTD  04/2010   LEXISCAN: EF 62%. NO ISCHEMIA OR INFARCTION. LOW RISK. No RWMA     Allergies  No Known Allergies  History of Present Illness    [name] has a PMH of HTN, HLD, type 2 diabetes, CKD stage III, chronic right bundle branch block, venous stasis, obesity, and mildly reduced systolic function.  He presented to the emergency department on 06/11/2022 and was discharged on 06/12/2022.  He presented with  URI symptoms.  He tested positive for COVID-19.  He was not noted to have pneumonia or hypoxemia.  His high-sensitivity troponins were elevated but flat.  It was felt that he had NSTEMI versus demand ischemia.  He denied chest pain.  His echocardiogram showed new regional wall motion abnormalities with a depressed ejection fraction.  Outpatient stress testing was recommended.  He was discharged in stable condition and instructed to follow-up with cardiology.  He underwent nuclear stress test on 07/14/2022 which showed low risk and an LVEF of 49%.  He presents to the clinic today for follow-up evaluation and states he feels fairly well.  He has returned to his normal daily activities.  He does report some slight shortness of breath with increased physical activity.  He has an upstairs bedroom and continues to cut his own grass both riding mower and push mowing.  We reviewed his recent ED visit.  He and his daughter expressed understanding.  He is doing physical therapy and balance activities currently.  We reviewed the importance of low-salt diet.  His blood pressure initially is 188/88 and on recheck is 118/72.  His blood pressure at home is in the 120s over 80s.  I will continue his current medication regimen, repeat his echocardiogram in 3 months, give salty 6 diet sheet ,and plan follow-up after testing.  Today denies  chest pain, shortness of breath, lower extremity edema, fatigue, palpitations, melena, hematuria, hemoptysis, diaphoresis, weakness, presyncope, syncope, orthopnea, and PND.   Home Medications    Prior to Admission medications   Medication Sig Start Date End Date Taking? Authorizing Provider  albuterol (VENTOLIN HFA) 108 (90 Base) MCG/ACT inhaler Inhale 2 puffs into the lungs every 4 (four) hours as needed for wheezing or shortness of breath. 11/23/19   [provider]  amLODipine (NORVASC) 5 MG tablet Take 5 mg by mouth daily. 04/03/18   [provider]  artificial  tears (LACRILUBE) OINT ophthalmic ointment Place 1 Application into both eyes See admin instructions. Place as directed into both eyes as directed at bedtime- 5 minutes after Travoprost    [provider]  Artificial Tears ophthalmic solution Place 1 drop into both eyes 3 (three) times daily.    [provider]  aspirin 81 MG tablet Take 81 mg by mouth daily.      [provider]  brimonidine-timolol (COMBIGAN) 0.2-0.5 % ophthalmic solution Place 1 drop into both eyes in the morning and at bedtime.    [provider]  ezetimibe (ZETIA) 10 MG tablet Take 10 mg by mouth daily. 03/21/18   [provider]  furosemide (LASIX) 20 MG tablet take 1 tablet by mouth twice a day Patient taking differently: Take 20 mg by mouth 2 (two) times daily. 07/08/12   Wendall Stade, MD  guaiFENesin-dextromethorphan (ROBITUSSIN DM) 100-10 MG/5ML syrup Take 10 mLs by mouth every 4 (four) hours as needed for cough. 06/12/22   Dorcas Carrow, MD  metoprolol succinate (TOPROL-XL) 100 MG 24 hr tablet take 1 tablet by mouth once daily Patient taking differently: Take 100 mg by mouth daily. 05/28/12   Anders Simmonds, PA-C  trandolapril (MAVIK) 4 MG tablet take 1 tablet by mouth twice a day NEEDS OFFICE VISIT/LABS FOR REFILLS Patient taking differently: Take 4 mg by mouth in the morning. 07/13/12   Dunn, Raymon Mutton, PA-C  Travoprost, BAK Free, (TRAVATAN Z) 0.004 % SOLN ophthalmic solution Place 1 drop into both eyes at bedtime.    [provider]    Family History    Family History  Problem Relation Age of Onset   Diabetes Mother    Heart disease Father 68   Colon cancer Neg Hx    He indicated that his mother is deceased. He indicated that his father is deceased. He indicated that the status of his neg hx is unknown.  Social History    Social History   Socioeconomic History   Marital status: Married    Spouse name: Not on file   Number of children: 2   Years of  education: Not on file   Highest education level: Not on file  Occupational History   Occupation: Paediatric nurse    Comment: self employed  Tobacco Use   Smoking status: Former    Types: Cigarettes    Quit date: 1980    Years since quitting: 43.6   Smokeless tobacco: Never   Tobacco comments:    quit 40 years ago  Vaping Use   Vaping Use: Never used  Substance and Sexual Activity   Alcohol use: Not Currently    Comment: quit over 20 yr ago   Drug use: No   Sexual activity: Not Currently  Other Topics Concern   Not on file  Social History Narrative   He is married to his wife -[name] ([place]) Barnhart.   Accompanied by his daughter.  Still works as a Art gallery manager    Slow decline in activity level - no routine exercise   Social Determinants of Radio broadcast assistant Strain: Not on file  Food Insecurity: Not on file  Transportation Needs: Not on file  Physical Activity: Not on file  Stress: Not on file  Social Connections: Not on file  Intimate Partner Violence: Not on file     Review of Systems    General:  No chills, fever, night sweats or weight changes.  Cardiovascular:  No chest pain, dyspnea on exertion, edema, orthopnea, palpitations, paroxysmal nocturnal dyspnea. Dermatological: No rash, lesions/masses Respiratory: No cough, dyspnea Urologic: No hematuria, dysuria Abdominal:   No nausea, vomiting, diarrhea, bright red blood per rectum, melena, or hematemesis Neurologic:  No visual changes, wkns, changes in mental status. All other systems reviewed and are otherwise negative except as noted above.  Physical Exam    VS:  BP 118/72   Pulse 74   Ht 6\' 1"  (1.854 m)   Wt 209 lb (94.8 kg)   SpO2 98%   BMI 27.57 kg/m  , BMI Body mass index is 27.57 kg/m. GEN: Well nourished, well developed, in no acute distress. HEENT: normal. Neck: Supple, no JVD, carotid bruits, or masses. Cardiac: RRR, no murmurs, rubs, or gallops. No clubbing, cyanosis, edema.  Radials/DP/PT 2+  and equal bilaterally.  Respiratory:  Respirations regular and unlabored, clear to auscultation bilaterally. GI: Soft, nontender, nondistended, BS + x 4. MS: no deformity or atrophy. Skin: warm and dry, no rash. Neuro:  Strength and sensation are intact. Psych: Normal affect.  Accessory Clinical Findings    Recent Labs: 06/11/2022: B Natriuretic Peptide 391.0; Magnesium 2.0 06/12/2022: ALT 15; BUN 16; Creatinine, Ser 1.56; Hemoglobin 15.6; Platelets 115; Potassium 3.9; Sodium 140   Recent Lipid Panel    Component Value Date/Time   CHOL 128 06/12/2022 0543   TRIG 63 06/12/2022 0543   HDL 47 06/12/2022 0543   CHOLHDL 2.7 06/12/2022 0543   VLDL 13 06/12/2022 0543   LDLCALC 68 06/12/2022 0543         ECG personally reviewed by me today-none today.  Nuclear stress test 07/14/2022    Findings are consistent with no prior ischemia. The study is low risk.   No ST deviation was noted.   LV perfusion is normal. There is no evidence of ischemia. There is no evidence of infarction.   Left ventricular function is abnormal. Global function is mildly reduced. There were no regional wall motion abnormalities. Nuclear stress EF: 49 %. The left ventricular ejection fraction is mildly decreased (45-54%). End diastolic cavity size is mildly enlarged. End systolic cavity size is mildly enlarged.   Prior study not available for comparison.   Mildly abnormal, low risk stress nuclear study with apical thinning but no ischemia.  Gated ejection fraction 49% with mild global hypokinesis; mild left ventricular enlargement.  Assessment & Plan   1.  Mildly reduced LV systolic function-nuclear stress test showed an LVEF of 49% with mild global hypokinesis and mild left ventricular enlargement. Continue furosemide, metoprolol Heart healthy low-sodium diet-salty 6 given Increase physical activity as tolerated Repeat echocardiogram in 3 months  NSTEMI versus demand ischemia.-No chest pain today.  Nuclear  stress test showed low risk. Continue amlodipine, aspirin, ezetimibe, metoprolol Heart healthy low-sodium diet-salty 6 given Increase physical activity as tolerated  Hyperlipidemia-LDL 68 on 06/12/2022 Continue ezetimibe, aspirin Heart healthy low-sodium diet-salty 6 given Increase physical activity as tolerated  Essential hypertension-BP today 118/72  Continue amlodipine, metoprolol Heart healthy low-sodium diet-salty 6 given Increase physical activity as tolerated  CKD stage III-creatinine 1.56 on 06/12/2022.  Baseline around 1.34-1.78 Hours with PCP  Disposition: Follow-up with Dr. Ellyn Hack or me in 4 months.   Jossie Ng. Kizzy Olafson NP-C     07/23/2022, 10:17 AM [address] Office [phone] Fax [phone]  Notice: This dictation was prepared with Dragon dictation along with smaller phrase technology. Any transcriptional errors that result from this process are unintentional and may not be corrected upon review.  I spent 15 minutes examining this patient, reviewing medications, and using patient centered shared decision making involving her cardiac care.  Prior to her visit I spent greater than 20 minutes reviewing her past medical history,  medications, and prior cardiac tests.

## 2022-07-23 ENCOUNTER — Ambulatory Visit: Payer: Medicare Other | Attending: General Practice | Admitting: General Practice

## 2022-07-23 ENCOUNTER — Encounter: Payer: Self-pay | Admitting: General Practice

## 2022-07-23 VITALS — BP 118/72 | HR 74 | Ht 73.0 in | Wt 209.0 lb

## 2022-07-23 DIAGNOSIS — N183 Chronic kidney disease, stage 3 unspecified: Secondary | ICD-10-CM | POA: Insufficient documentation

## 2022-07-23 DIAGNOSIS — I248 Other forms of acute ischemic heart disease: Secondary | ICD-10-CM | POA: Diagnosis not present

## 2022-07-23 DIAGNOSIS — I5030 Unspecified diastolic (congestive) heart failure: Secondary | ICD-10-CM | POA: Diagnosis not present

## 2022-07-23 DIAGNOSIS — E785 Hyperlipidemia, unspecified: Secondary | ICD-10-CM | POA: Diagnosis not present

## 2022-07-23 DIAGNOSIS — I429 Cardiomyopathy, unspecified: Secondary | ICD-10-CM | POA: Diagnosis not present

## 2022-07-23 DIAGNOSIS — I10 Essential (primary) hypertension: Secondary | ICD-10-CM | POA: Diagnosis not present

## 2022-07-23 DIAGNOSIS — E1169 Type 2 diabetes mellitus with other specified complication: Secondary | ICD-10-CM | POA: Insufficient documentation

## 2022-07-23 NOTE — Patient Instructions (Signed)
Medication Instructions:  The current medical regimen is effective;  continue present plan and medications as directed. Please refer to the Current Medication list given to you today.   *If you need a refill on your cardiac medications before your next appointment, please call your pharmacy*  Lab Work:    NONE      If you have labs (blood work) drawn today and your tests are completely normal, you will receive your results only by:  1-MyChart Message (if you have MyChart) OR 2- A paper copy in the mail.  If you have any lab test that is abnormal or we need to change your treatment, we will call you to review the results.  Testing/Procedures:  Echocardiogram  IN 2 MONTHS - Your physician has requested that you have an echocardiogram. Echocardiography is a painless test that uses sound waves to create images of your heart. It provides your doctor with information about the size and shape of your heart and how well your heart's chambers and valves are working. This procedure takes approximately one hour. There are no restrictions for this procedure.    Special Instructions PLEASE READ AND FOLLOW SALTY 6-ATTACHED-1,800mg  daily  PLEASE INCREASE PHYSICAL ACTIVITY AS TOLERATED   Follow-Up: Your next appointment:  AFTER  ECHO  In Person with Bryan Lemma, MD  or Edd Fabian, FNP    At [facility], you and your health needs are our priority.  As part of our continuing mission to provide you with exceptional heart care, we have created designated Provider Care Teams.  These Care Teams include your primary Cardiologist (physician) and Advanced Practice Providers (APPs -  Physician Assistants and Nurse Practitioners) who all work together to provide you with the care you need, when you need it.  Important Information About Sugar             6 SALTY THINGS TO AVOID     1,800MG  DAILY

## 2022-07-24 DIAGNOSIS — E1136 Type 2 diabetes mellitus with diabetic cataract: Secondary | ICD-10-CM | POA: Diagnosis not present

## 2022-07-24 DIAGNOSIS — I10 Essential (primary) hypertension: Secondary | ICD-10-CM | POA: Diagnosis not present

## 2022-07-24 DIAGNOSIS — U071 COVID-19: Secondary | ICD-10-CM | POA: Diagnosis not present

## 2022-07-24 DIAGNOSIS — N1831 Chronic kidney disease, stage 3a: Secondary | ICD-10-CM | POA: Diagnosis not present

## 2022-07-24 DIAGNOSIS — E1122 Type 2 diabetes mellitus with diabetic chronic kidney disease: Secondary | ICD-10-CM | POA: Diagnosis not present

## 2022-07-24 DIAGNOSIS — I214 Non-ST elevation (NSTEMI) myocardial infarction: Secondary | ICD-10-CM | POA: Diagnosis not present

## 2022-07-28 DIAGNOSIS — I214 Non-ST elevation (NSTEMI) myocardial infarction: Secondary | ICD-10-CM | POA: Diagnosis not present

## 2022-07-28 DIAGNOSIS — E1136 Type 2 diabetes mellitus with diabetic cataract: Secondary | ICD-10-CM | POA: Diagnosis not present

## 2022-07-28 DIAGNOSIS — N1831 Chronic kidney disease, stage 3a: Secondary | ICD-10-CM | POA: Diagnosis not present

## 2022-07-28 DIAGNOSIS — U071 COVID-19: Secondary | ICD-10-CM | POA: Diagnosis not present

## 2022-07-28 DIAGNOSIS — E1122 Type 2 diabetes mellitus with diabetic chronic kidney disease: Secondary | ICD-10-CM | POA: Diagnosis not present

## 2022-07-28 DIAGNOSIS — I10 Essential (primary) hypertension: Secondary | ICD-10-CM | POA: Diagnosis not present

## 2022-08-03 DIAGNOSIS — E1169 Type 2 diabetes mellitus with other specified complication: Secondary | ICD-10-CM | POA: Diagnosis not present

## 2022-08-03 DIAGNOSIS — E7800 Pure hypercholesterolemia, unspecified: Secondary | ICD-10-CM | POA: Diagnosis not present

## 2022-08-03 DIAGNOSIS — N1832 Chronic kidney disease, stage 3b: Secondary | ICD-10-CM | POA: Diagnosis not present

## 2022-08-03 DIAGNOSIS — I10 Essential (primary) hypertension: Secondary | ICD-10-CM | POA: Diagnosis not present

## 2022-08-05 DIAGNOSIS — E1136 Type 2 diabetes mellitus with diabetic cataract: Secondary | ICD-10-CM | POA: Diagnosis not present

## 2022-08-05 DIAGNOSIS — U071 COVID-19: Secondary | ICD-10-CM | POA: Diagnosis not present

## 2022-08-05 DIAGNOSIS — I10 Essential (primary) hypertension: Secondary | ICD-10-CM | POA: Diagnosis not present

## 2022-08-05 DIAGNOSIS — N1831 Chronic kidney disease, stage 3a: Secondary | ICD-10-CM | POA: Diagnosis not present

## 2022-08-05 DIAGNOSIS — I214 Non-ST elevation (NSTEMI) myocardial infarction: Secondary | ICD-10-CM | POA: Diagnosis not present

## 2022-08-05 DIAGNOSIS — E1122 Type 2 diabetes mellitus with diabetic chronic kidney disease: Secondary | ICD-10-CM | POA: Diagnosis not present

## 2022-08-06 DIAGNOSIS — I8310 Varicose veins of unspecified lower extremity with inflammation: Secondary | ICD-10-CM | POA: Diagnosis not present

## 2022-08-06 DIAGNOSIS — E1169 Type 2 diabetes mellitus with other specified complication: Secondary | ICD-10-CM | POA: Diagnosis not present

## 2022-08-06 DIAGNOSIS — G720 Drug-induced myopathy: Secondary | ICD-10-CM | POA: Diagnosis not present

## 2022-08-06 DIAGNOSIS — I10 Essential (primary) hypertension: Secondary | ICD-10-CM | POA: Diagnosis not present

## 2022-08-06 DIAGNOSIS — I700 Atherosclerosis of aorta: Secondary | ICD-10-CM | POA: Diagnosis not present

## 2022-08-06 DIAGNOSIS — N1832 Chronic kidney disease, stage 3b: Secondary | ICD-10-CM | POA: Diagnosis not present

## 2022-08-06 DIAGNOSIS — Z23 Encounter for immunization: Secondary | ICD-10-CM | POA: Diagnosis not present

## 2022-08-06 DIAGNOSIS — I429 Cardiomyopathy, unspecified: Secondary | ICD-10-CM | POA: Diagnosis not present

## 2022-08-10 DIAGNOSIS — E1136 Type 2 diabetes mellitus with diabetic cataract: Secondary | ICD-10-CM | POA: Diagnosis not present

## 2022-08-10 DIAGNOSIS — E1122 Type 2 diabetes mellitus with diabetic chronic kidney disease: Secondary | ICD-10-CM | POA: Diagnosis not present

## 2022-08-10 DIAGNOSIS — I10 Essential (primary) hypertension: Secondary | ICD-10-CM | POA: Diagnosis not present

## 2022-08-10 DIAGNOSIS — N1831 Chronic kidney disease, stage 3a: Secondary | ICD-10-CM | POA: Diagnosis not present

## 2022-08-10 DIAGNOSIS — U071 COVID-19: Secondary | ICD-10-CM | POA: Diagnosis not present

## 2022-08-10 DIAGNOSIS — I214 Non-ST elevation (NSTEMI) myocardial infarction: Secondary | ICD-10-CM | POA: Diagnosis not present

## 2022-08-13 DIAGNOSIS — R350 Frequency of micturition: Secondary | ICD-10-CM | POA: Diagnosis not present

## 2022-08-13 DIAGNOSIS — I252 Old myocardial infarction: Secondary | ICD-10-CM | POA: Diagnosis not present

## 2022-08-13 DIAGNOSIS — I4510 Unspecified right bundle-branch block: Secondary | ICD-10-CM | POA: Diagnosis not present

## 2022-08-13 DIAGNOSIS — Z87891 Personal history of nicotine dependence: Secondary | ICD-10-CM | POA: Diagnosis not present

## 2022-08-13 DIAGNOSIS — N1831 Chronic kidney disease, stage 3a: Secondary | ICD-10-CM | POA: Diagnosis not present

## 2022-08-13 DIAGNOSIS — Z7982 Long term (current) use of aspirin: Secondary | ICD-10-CM | POA: Diagnosis not present

## 2022-08-13 DIAGNOSIS — E1136 Type 2 diabetes mellitus with diabetic cataract: Secondary | ICD-10-CM | POA: Diagnosis not present

## 2022-08-13 DIAGNOSIS — U071 COVID-19: Secondary | ICD-10-CM | POA: Diagnosis not present

## 2022-08-13 DIAGNOSIS — I872 Venous insufficiency (chronic) (peripheral): Secondary | ICD-10-CM | POA: Diagnosis not present

## 2022-08-13 DIAGNOSIS — M160 Bilateral primary osteoarthritis of hip: Secondary | ICD-10-CM | POA: Diagnosis not present

## 2022-08-13 DIAGNOSIS — E1122 Type 2 diabetes mellitus with diabetic chronic kidney disease: Secondary | ICD-10-CM | POA: Diagnosis not present

## 2022-08-13 DIAGNOSIS — I10 Essential (primary) hypertension: Secondary | ICD-10-CM | POA: Diagnosis not present

## 2022-08-13 DIAGNOSIS — H409 Unspecified glaucoma: Secondary | ICD-10-CM | POA: Diagnosis not present

## 2022-08-13 DIAGNOSIS — E785 Hyperlipidemia, unspecified: Secondary | ICD-10-CM | POA: Diagnosis not present

## 2022-08-13 DIAGNOSIS — Z9181 History of falling: Secondary | ICD-10-CM | POA: Diagnosis not present

## 2022-08-13 DIAGNOSIS — E1169 Type 2 diabetes mellitus with other specified complication: Secondary | ICD-10-CM | POA: Diagnosis not present

## 2022-08-14 DIAGNOSIS — H401113 Primary open-angle glaucoma, right eye, severe stage: Secondary | ICD-10-CM | POA: Diagnosis not present

## 2022-08-18 DIAGNOSIS — E1136 Type 2 diabetes mellitus with diabetic cataract: Secondary | ICD-10-CM | POA: Diagnosis not present

## 2022-08-18 DIAGNOSIS — I10 Essential (primary) hypertension: Secondary | ICD-10-CM | POA: Diagnosis not present

## 2022-08-18 DIAGNOSIS — N1831 Chronic kidney disease, stage 3a: Secondary | ICD-10-CM | POA: Diagnosis not present

## 2022-08-18 DIAGNOSIS — U071 COVID-19: Secondary | ICD-10-CM | POA: Diagnosis not present

## 2022-08-18 DIAGNOSIS — E1122 Type 2 diabetes mellitus with diabetic chronic kidney disease: Secondary | ICD-10-CM | POA: Diagnosis not present

## 2022-08-18 DIAGNOSIS — I252 Old myocardial infarction: Secondary | ICD-10-CM | POA: Diagnosis not present

## 2022-08-26 DIAGNOSIS — E1136 Type 2 diabetes mellitus with diabetic cataract: Secondary | ICD-10-CM | POA: Diagnosis not present

## 2022-08-26 DIAGNOSIS — I10 Essential (primary) hypertension: Secondary | ICD-10-CM | POA: Diagnosis not present

## 2022-08-26 DIAGNOSIS — E1122 Type 2 diabetes mellitus with diabetic chronic kidney disease: Secondary | ICD-10-CM | POA: Diagnosis not present

## 2022-08-26 DIAGNOSIS — I252 Old myocardial infarction: Secondary | ICD-10-CM | POA: Diagnosis not present

## 2022-08-26 DIAGNOSIS — N1831 Chronic kidney disease, stage 3a: Secondary | ICD-10-CM | POA: Diagnosis not present

## 2022-08-26 DIAGNOSIS — U071 COVID-19: Secondary | ICD-10-CM | POA: Diagnosis not present

## 2022-09-03 DIAGNOSIS — N1831 Chronic kidney disease, stage 3a: Secondary | ICD-10-CM | POA: Diagnosis not present

## 2022-09-03 DIAGNOSIS — I10 Essential (primary) hypertension: Secondary | ICD-10-CM | POA: Diagnosis not present

## 2022-09-03 DIAGNOSIS — E1136 Type 2 diabetes mellitus with diabetic cataract: Secondary | ICD-10-CM | POA: Diagnosis not present

## 2022-09-03 DIAGNOSIS — E1122 Type 2 diabetes mellitus with diabetic chronic kidney disease: Secondary | ICD-10-CM | POA: Diagnosis not present

## 2022-09-03 DIAGNOSIS — U071 COVID-19: Secondary | ICD-10-CM | POA: Diagnosis not present

## 2022-09-03 DIAGNOSIS — I252 Old myocardial infarction: Secondary | ICD-10-CM | POA: Diagnosis not present

## 2022-09-06 DIAGNOSIS — I252 Old myocardial infarction: Secondary | ICD-10-CM | POA: Diagnosis not present

## 2022-09-06 DIAGNOSIS — N1831 Chronic kidney disease, stage 3a: Secondary | ICD-10-CM | POA: Diagnosis not present

## 2022-09-06 DIAGNOSIS — U071 COVID-19: Secondary | ICD-10-CM | POA: Diagnosis not present

## 2022-09-06 DIAGNOSIS — E1136 Type 2 diabetes mellitus with diabetic cataract: Secondary | ICD-10-CM | POA: Diagnosis not present

## 2022-09-06 DIAGNOSIS — I10 Essential (primary) hypertension: Secondary | ICD-10-CM | POA: Diagnosis not present

## 2022-09-06 DIAGNOSIS — E1122 Type 2 diabetes mellitus with diabetic chronic kidney disease: Secondary | ICD-10-CM | POA: Diagnosis not present

## 2022-09-11 ENCOUNTER — Ambulatory Visit (HOSPITAL_COMMUNITY): Payer: Medicare Other | Attending: Cardiology

## 2022-09-11 DIAGNOSIS — I5030 Unspecified diastolic (congestive) heart failure: Secondary | ICD-10-CM | POA: Insufficient documentation

## 2022-09-11 LAB — ECHOCARDIOGRAM COMPLETE
Area-P 1/2: 3.22 cm2
S' Lateral: 3 cm

## 2022-09-16 DIAGNOSIS — Z23 Encounter for immunization: Secondary | ICD-10-CM | POA: Diagnosis not present

## 2022-09-18 ENCOUNTER — Other Ambulatory Visit (HOSPITAL_COMMUNITY): Payer: Medicare Other

## 2022-09-24 DIAGNOSIS — E1169 Type 2 diabetes mellitus with other specified complication: Secondary | ICD-10-CM | POA: Diagnosis not present

## 2022-09-24 DIAGNOSIS — I509 Heart failure, unspecified: Secondary | ICD-10-CM | POA: Diagnosis not present

## 2022-09-24 DIAGNOSIS — N1832 Chronic kidney disease, stage 3b: Secondary | ICD-10-CM | POA: Diagnosis not present

## 2022-09-24 DIAGNOSIS — E7800 Pure hypercholesterolemia, unspecified: Secondary | ICD-10-CM | POA: Diagnosis not present

## 2022-09-24 DIAGNOSIS — I10 Essential (primary) hypertension: Secondary | ICD-10-CM | POA: Diagnosis not present

## 2022-09-24 NOTE — Progress Notes (Signed)
Cardiology Clinic Note   Patient Name: Steven Myers Date of Encounter: 09/25/2022  Primary Care Provider:  Renford Dills, MD Primary Cardiologist:  Bryan Lemma, MD  Patient Profile    Steven Myers 87 year old male presents the clinic today for follow-up of his DOE, HLD, and coronary artery disease.  Past Medical History    Past Medical History:  Diagnosis Date   Bilateral cataracts    Cardiomyopathy (HCC)    a. 05/2018 Echo: EF 60-65%, no rwma, GrI DD. Mildly dil Ao root. Mild BAE; b. 05/2022 Echo: EF 45-50%, inferior basal HK. Sev LVH, GrI DD, mildly enlarged RV w/ mildly reduced fxn. Trivm MR. AoV sclerosis. Asc Ao 60mm.   Chest pain    a. 03/2007 MV: No isch/infarct; b. 04/2010 Lexi MV: EF 62%. No isch/infarct.   CKD (chronic kidney disease) stage 3, GFR 30-59 ml/min (HCC)    DM (diabetes mellitus) type II controlled with renal manifestation (HCC)    Not on insulin   Essential hypertension    Glaucoma    Optho: Dr. Cathey Endow   History of colonoscopy    Hyperlipidemia associated with type 2 diabetes mellitus (HCC)    Intolerant to statins --> because of elevated LFTs.   Obesity due to excess calories without serious comorbidity    BMI ~32 - mostly truncal obesity (mesomorphic)   Osteoarthritis of hip    Right > Left -- limits walking   RBBB (right bundle branch block)    Venous stasis dermatitis of both lower extremities - with edema    L leg worse than R 2/2 prior injury.; controlled somewhat with support hose   Past Surgical History:  Procedure Laterality Date   NM MYOVIEW LTD  04/2010   LEXISCAN: EF 62%. NO ISCHEMIA OR INFARCTION. LOW RISK. No RWMA     Allergies  No Known Allergies  History of Present Illness    Steven Myers has a PMH of HTN, HLD, type 2 diabetes, CKD stage III, chronic right bundle branch block, venous stasis, obesity, and mildly reduced systolic function.  He presented to the emergency department on 06/11/2022 and was discharged on  06/12/2022.  He presented with URI symptoms.  He tested positive for COVID-19.  He was not noted to have pneumonia or hypoxemia.  His high-sensitivity troponins were elevated but flat.  It was felt that he had NSTEMI versus demand ischemia.  He denied chest pain.  His echocardiogram showed new regional wall motion abnormalities with a depressed ejection fraction.  Outpatient stress testing was recommended.  He was discharged in stable condition and instructed to follow-up with cardiology.  He underwent nuclear stress test on 07/14/2022 which showed low risk and an LVEF of 49%.  He presented to the clinic 07/23/2022 for follow-up evaluation and stated he felt fairly well.  He had returned to his normal daily activities.  He did report some slight shortness of breath with increased physical activity.  He had an upstairs bedroom and continued to cut his own grass both riding mower and push mowing.  We reviewed his recent ED visit.  He and his daughter expressed understanding.  He was doing physical therapy and balance activities .  We reviewed the importance of low-salt diet.  His blood pressure initially was 188/88 and on recheck is 118/72.  His blood pressure at home was in the 120s over 80s.  I  continued his  medication regimen, planned repeat echocardiogram in 3 months, gave salty 6 diet sheet ,and planned follow-up  after testing.  His echocardiogram 09/11/2022 showed LVEF of 45-50%, G2 DD, reduced RV systolic function, mildly dilated left and right atria, mild mitral valve regurgitation, and no other significant valvular abnormalities.  EF is stable.  He presents to the clinic today for follow-up evaluation states he feels well.  He continues to be somewhat physically active at home.  He enjoys walking and doing yard work.  He completed physical therapy and was instructed that he should go to [facility] to continue physical activity.  He feels he will be able to complete the physical therapy exercises and  continue his physical activity at home.  He has been monitoring his blood pressure.  We reviewed his echocardiogram.  He and his daughter expressed understanding.  I will have him maintain his heart healthy low-sodium diet, maintain his physical activity, maintain blood pressure log, and plan follow-up for 6 months.  Today denies chest pain, shortness of breath, lower extremity edema, fatigue, palpitations, melena, hematuria, hemoptysis, diaphoresis, weakness, presyncope, syncope, orthopnea, and PND.   Home Medications    Prior to Admission medications   Medication Sig Start Date End Date Taking? Authorizing Provider  albuterol (VENTOLIN HFA) 108 (90 Base) MCG/ACT inhaler Inhale 2 puffs into the lungs every 4 (four) hours as needed for wheezing or shortness of breath. 11/23/19   [provider]  amLODipine (NORVASC) 5 MG tablet Take 5 mg by mouth daily. 04/03/18   [provider]  artificial tears (LACRILUBE) OINT ophthalmic ointment Place 1 Application into both eyes See admin instructions. Place as directed into both eyes as directed at bedtime- 5 minutes after Travoprost    [provider]  Artificial Tears ophthalmic solution Place 1 drop into both eyes 3 (three) times daily.    [provider]  aspirin 81 MG tablet Take 81 mg by mouth daily.      [provider]  brimonidine-timolol (COMBIGAN) 0.2-0.5 % ophthalmic solution Place 1 drop into both eyes in the morning and at bedtime.    [provider]  ezetimibe (ZETIA) 10 MG tablet Take 10 mg by mouth daily. 03/21/18   [provider]  furosemide (LASIX) 20 MG tablet take 1 tablet by mouth twice a day Patient taking differently: Take 20 mg by mouth 2 (two) times daily. 07/08/12   Wendall Stade, MD  guaiFENesin-dextromethorphan (ROBITUSSIN DM) 100-10 MG/5ML syrup Take 10 mLs by mouth every 4 (four) hours as needed for cough. 06/12/22   Dorcas Carrow, MD  metoprolol succinate  (TOPROL-XL) 100 MG 24 hr tablet take 1 tablet by mouth once daily Patient taking differently: Take 100 mg by mouth daily. 05/28/12   Anders Simmonds, PA-C  trandolapril (MAVIK) 4 MG tablet take 1 tablet by mouth twice a day NEEDS OFFICE VISIT/LABS FOR REFILLS Patient taking differently: Take 4 mg by mouth in the morning. 07/13/12   Dunn, Raymon Mutton, PA-C  Travoprost, BAK Free, (TRAVATAN Z) 0.004 % SOLN ophthalmic solution Place 1 drop into both eyes at bedtime.    [provider]    Family History    Family History  Problem Relation Age of Onset   Diabetes Mother    Heart disease Father 70   Colon cancer Neg Hx    He indicated that his mother is deceased. He indicated that his father is deceased. He indicated that the status of his neg hx is unknown.  Social History    Social History   Socioeconomic History   Marital status: Married  Spouse name: Not on file   Number of children: 2   Years of education: Not on file   Highest education level: Not on file  Occupational History   Occupation: [employer]    Comment: self employed  Tobacco Use   Smoking status: Former    Types: Cigarettes    Quit date: 1980    Years since quitting: 43.8   Smokeless tobacco: Never   Tobacco comments:    quit 40 years ago  Vaping Use   Vaping Use: Never used  Substance and Sexual Activity   Alcohol use: Not Currently    Comment: quit over 20 yr ago   Drug use: No   Sexual activity: Not Currently  Other Topics Concern   Not on file  Social History Narrative   He is married to his wife -Stanton Kidney ([place]) [place].   Accompanied by his daughter.   Still works as a Art gallery manager    Slow decline in activity level - no routine exercise   Social Determinants of Radio broadcast assistant Strain: Not on file  Food Insecurity: Not on file  Transportation Needs: Not on file  Physical Activity: Not on file  Stress: Not on file  Social Connections: Not on file  Intimate Partner Violence: Not on file      Review of Systems    General:  No chills, fever, night sweats or weight changes.  Cardiovascular:  No chest pain, dyspnea on exertion, edema, orthopnea, palpitations, paroxysmal nocturnal dyspnea. Dermatological: No rash, lesions/masses Respiratory: No cough, dyspnea Urologic: No hematuria, dysuria Abdominal:   No nausea, vomiting, diarrhea, bright red blood per rectum, melena, or hematemesis Neurologic:  No visual changes, wkns, changes in mental status. All other systems reviewed and are otherwise negative except as noted above.  Physical Exam    VS:  BP 116/82   Pulse 68   Ht 6\' 1"  (1.854 m)   Wt 207 lb 12.8 oz (94.3 kg)   SpO2 100%   BMI 27.42 kg/m  , BMI Body mass index is 27.42 kg/m. GEN: Well nourished, well developed, in no acute distress. HEENT: normal. Neck: Supple, no JVD, carotid bruits, or masses. Cardiac: RRR, no murmurs, rubs, or gallops. No clubbing, cyanosis, edema.  Radials/DP/PT 2+ and equal bilaterally.  Respiratory:  Respirations regular and unlabored, clear to auscultation bilaterally. GI: Soft, nontender, nondistended, BS + x 4. MS: no deformity or atrophy. Skin: warm and dry, no rash. Neuro:  Strength and sensation are intact. Psych: Normal affect.  Accessory Clinical Findings    Recent Labs: 06/11/2022: B Natriuretic Peptide 391.0; Magnesium 2.0 06/12/2022: ALT 15; BUN 16; Creatinine, Ser 1.56; Hemoglobin 15.6; Platelets 115; Potassium 3.9; Sodium 140   Recent Lipid Panel    Component Value Date/Time   CHOL 128 06/12/2022 0543   TRIG 63 06/12/2022 0543   HDL 47 06/12/2022 0543   CHOLHDL 2.7 06/12/2022 0543   VLDL 13 06/12/2022 0543   LDLCALC 68 06/12/2022 0543         ECG personally reviewed by me today-none today.  Nuclear stress test 07/14/2022    Findings are consistent with no prior ischemia. The study is low risk.   No ST deviation was noted.   LV perfusion is normal. There is no evidence of ischemia. There is no evidence of  infarction.   Left ventricular function is abnormal. Global function is mildly reduced. There were no regional wall motion abnormalities. Nuclear stress EF: 49 %. The left ventricular ejection fraction is  mildly decreased (45-54%). End diastolic cavity size is mildly enlarged. End systolic cavity size is mildly enlarged.   Prior study not available for comparison.   Mildly abnormal, low risk stress nuclear study with apical thinning but no ischemia.  Gated ejection fraction 49% with mild global hypokinesis; mild left ventricular enlargement.  Echocardiogram 09/11/2022 IMPRESSIONS     1. Akinesis of the inferolateral wall with overall mildly reduced LV  function; severe LVH with speckled pattern; apical sparing noted on GLS;  consider amyloid; cannot R/O apical thrombus vs prominent trabeculae;  suggest cardiac MRI to further assess.   2. Left ventricular ejection fraction, by estimation, is 45 to 50%. The  left ventricle has mildly decreased function. The left ventricle  demonstrates regional wall motion abnormalities (see scoring  diagram/findings for description). There is severe  concentric left ventricular hypertrophy. Left ventricular diastolic  parameters are consistent with Grade II diastolic dysfunction  (pseudonormalization). The average left ventricular global longitudinal  strain is -17.8 %. The global longitudinal strain is   abnormal.   3. Right ventricular systolic function is severely reduced. The right  ventricular size is mildly enlarged.   4. Left atrial size was mildly dilated.   5. Right atrial size was mildly dilated.   6. The mitral valve is normal in structure. Mild mitral valve  regurgitation. No evidence of mitral stenosis.   7. The aortic valve is tricuspid. Aortic valve regurgitation is not  visualized. No aortic stenosis is present.   8. Aortic dilatation noted. There is borderline dilatation of the  ascending aorta, measuring 38 mm.   9. The inferior vena  cava is normal in size with greater than 50%  respiratory variability, suggesting right atrial pressure of 3 mmHg.    Assessment & Plan   1.  Mildly reduced LV systolic function-nuclear stress test showed an LVEF of 49% with mild global hypokinesis and mild left ventricular enlargement.  Follow-up echocardiogram 09/11/2022 showed EF of 45-50% and G2 DD.  Details above. Continue furosemide, metoprolol Heart healthy low-sodium diet-salty 6 reviewed Increase physical activity as tolerated Reviewed echocardiogram  Essential hypertension-BP today 116/82.  Monitoring at home. Continue amlodipine, metoprolol Heart healthy low-sodium diet Increase physical activity as tolerated Maintain blood pressure log-bring blood pressure cuff to next appointment.  NSTEMI versus demand ischemia.-No chest pain or recent episodes.  Nuclear stress test showed low risk. Continue amlodipine, aspirin, ezetimibe, metoprolol Heart healthy low-sodium diet Increase physical activity as tolerated  Hyperlipidemia-LDL 68 on 06/12/2022 Continue ezetimibe, aspirin Heart healthy low-sodium high-fiber diet Increase physical activity as tolerated  CKD stage III-creatinine noted to be 1.56 on 06/12/2022.  Baseline around 1.34-1.78 Hours with PCP  Disposition: Follow-up with Dr. Herbie Baltimore or me in 6 months.   Thomasene Ripple. Langston Summerfield NP-C     09/25/2022, 10:27 AM [facility] [address] Office [phone] Fax [phone]  Notice: This dictation was prepared with Dragon dictation along with smaller phrase technology. Any transcriptional errors that result from this process are unintentional and may not be corrected upon review.  I spent  14  minutes examining this patient, reviewing medications, and using patient centered shared decision making involving her cardiac care.  Prior to her visit I spent greater than 20 minutes reviewing her past medical history,  medications, and prior  cardiac tests.

## 2022-09-25 ENCOUNTER — Ambulatory Visit: Payer: Medicare Other | Attending: General Practice | Admitting: General Practice

## 2022-09-25 ENCOUNTER — Encounter: Payer: Self-pay | Admitting: General Practice

## 2022-09-25 VITALS — BP 116/82 | HR 68 | Ht 73.0 in | Wt 207.8 lb

## 2022-09-25 DIAGNOSIS — E1169 Type 2 diabetes mellitus with other specified complication: Secondary | ICD-10-CM | POA: Insufficient documentation

## 2022-09-25 DIAGNOSIS — I429 Cardiomyopathy, unspecified: Secondary | ICD-10-CM | POA: Insufficient documentation

## 2022-09-25 DIAGNOSIS — I214 Non-ST elevation (NSTEMI) myocardial infarction: Secondary | ICD-10-CM | POA: Diagnosis not present

## 2022-09-25 DIAGNOSIS — I10 Essential (primary) hypertension: Secondary | ICD-10-CM | POA: Diagnosis not present

## 2022-09-25 DIAGNOSIS — E785 Hyperlipidemia, unspecified: Secondary | ICD-10-CM | POA: Diagnosis not present

## 2022-09-25 DIAGNOSIS — N183 Chronic kidney disease, stage 3 unspecified: Secondary | ICD-10-CM | POA: Insufficient documentation

## 2022-09-25 NOTE — Patient Instructions (Signed)
Medication Instructions:  The current medical regimen is effective;  continue present plan and medications as directed. Please refer to the Current Medication list given to you today. *If you need a refill on your cardiac medications before your next appointment, please call your pharmacy*  Lab Work: NONE If you have labs (blood work) drawn today and your tests are completely normal, you will receive your results only by: [place] (if you have MyChart) OR A paper copy in the mail  If you have any lab test that is abnormal or we need to change your treatment, we will call you to review the results.  Testing/Procedures: NONE  Follow-Up: At [facility], you and your health needs are our priority.  As part of our continuing mission to provide you with exceptional heart care, we have created designated Provider Care Teams.  These Care Teams include your primary Cardiologist (physician) and Advanced Practice Providers (APPs -  Physician Assistants and Nurse Practitioners) who all work together to provide you with the care you need, when you need it.  Your next appointment:   6 month(s)  The format for your next appointment:   In Person  Provider:   Glenetta Hew, MD  or Coletta Memos, FNP        Other Instructions TAKE AND LOG YOUR BLOOD PRESSURE, BRING LOG BACK WITH YOU TO YOUR FOLLOW UP APPOINTMENT  Important Information About Sugar

## 2022-10-08 DIAGNOSIS — H401113 Primary open-angle glaucoma, right eye, severe stage: Secondary | ICD-10-CM | POA: Diagnosis not present

## 2022-10-09 ENCOUNTER — Ambulatory Visit (INDEPENDENT_AMBULATORY_CARE_PROVIDER_SITE_OTHER): Payer: Medicare Other | Admitting: Podiatry

## 2022-10-09 ENCOUNTER — Encounter: Payer: Self-pay | Admitting: Podiatry

## 2022-10-09 DIAGNOSIS — M79674 Pain in right toe(s): Secondary | ICD-10-CM | POA: Diagnosis not present

## 2022-10-09 DIAGNOSIS — B351 Tinea unguium: Secondary | ICD-10-CM | POA: Diagnosis not present

## 2022-10-09 DIAGNOSIS — M79675 Pain in left toe(s): Secondary | ICD-10-CM | POA: Diagnosis not present

## 2022-10-09 DIAGNOSIS — E1122 Type 2 diabetes mellitus with diabetic chronic kidney disease: Secondary | ICD-10-CM | POA: Diagnosis not present

## 2022-10-09 NOTE — Progress Notes (Signed)
This patient returns to my office for at risk foot care.  This patient requires this care by a professional since this patient will be at risk due to having diabetes and CKD.  This patient is unable to cut nails himself since the patient cannot reach his nails.These nails are painful walking and wearing shoes.  This patient presents for at risk foot care today.  General Appearance  Alert, conversant and in no acute stress.  Vascular  Dorsalis pedis and posterior tibial  pulses are palpable  bilaterally.  Capillary return is within normal limits  bilaterally. Temperature is within normal limits  bilaterally.  Neurologic  Senn-Weinstein monofilament wire test within normal limits  bilaterally. Muscle power within normal limits bilaterally.  Nails Thick disfigured discolored nails with subungual debris  from hallux to fifth toes bilaterally. No evidence of bacterial infection or drainage bilaterally.  Orthopedic  No limitations of motion  feet .  No crepitus or effusions noted.  No bony pathology or digital deformities noted.  Skin  normotropic skin with no porokeratosis noted bilaterally.  No signs of infections or ulcers noted.     Onychomycosis  Pain in right toes  Pain in left toes  Consent was obtained for treatment procedures.   Mechanical debridement of nails 1-5  bilaterally performed with a nail nipper.  Filed with dremel without incident.    Return office visit   3 months                   Told patient to return for periodic foot care and evaluation due to potential at risk complications.   Carman Essick DPM   

## 2022-11-14 ENCOUNTER — Encounter (HOSPITAL_COMMUNITY): Payer: Self-pay

## 2022-11-14 ENCOUNTER — Observation Stay (HOSPITAL_COMMUNITY)
Admission: EM | Admit: 2022-11-14 | Discharge: 2022-11-15 | Disposition: A | Discharge status: Home / Self Care | Payer: Medicare Other | Attending: Internal Medicine | Admitting: Internal Medicine

## 2022-11-14 ENCOUNTER — Emergency Department (HOSPITAL_COMMUNITY): Payer: Medicare Other

## 2022-11-14 ENCOUNTER — Other Ambulatory Visit: Payer: Self-pay

## 2022-11-14 DIAGNOSIS — Z79899 Other long term (current) drug therapy: Secondary | ICD-10-CM | POA: Diagnosis not present

## 2022-11-14 DIAGNOSIS — E875 Hyperkalemia: Secondary | ICD-10-CM | POA: Diagnosis not present

## 2022-11-14 DIAGNOSIS — I130 Hypertensive heart and chronic kidney disease with heart failure and stage 1 through stage 4 chronic kidney disease, or unspecified chronic kidney disease: Secondary | ICD-10-CM | POA: Insufficient documentation

## 2022-11-14 DIAGNOSIS — R531 Weakness: Secondary | ICD-10-CM | POA: Diagnosis not present

## 2022-11-14 DIAGNOSIS — I5043 Acute on chronic combined systolic (congestive) and diastolic (congestive) heart failure: Secondary | ICD-10-CM | POA: Insufficient documentation

## 2022-11-14 DIAGNOSIS — Z87891 Personal history of nicotine dependence: Secondary | ICD-10-CM | POA: Diagnosis not present

## 2022-11-14 DIAGNOSIS — N1831 Chronic kidney disease, stage 3a: Secondary | ICD-10-CM | POA: Diagnosis not present

## 2022-11-14 DIAGNOSIS — Z7982 Long term (current) use of aspirin: Secondary | ICD-10-CM | POA: Diagnosis not present

## 2022-11-14 DIAGNOSIS — E860 Dehydration: Secondary | ICD-10-CM | POA: Insufficient documentation

## 2022-11-14 DIAGNOSIS — R112 Nausea with vomiting, unspecified: Secondary | ICD-10-CM | POA: Diagnosis not present

## 2022-11-14 DIAGNOSIS — N179 Acute kidney failure, unspecified: Secondary | ICD-10-CM | POA: Diagnosis not present

## 2022-11-14 DIAGNOSIS — R55 Syncope and collapse: Principal | ICD-10-CM | POA: Diagnosis present

## 2022-11-14 DIAGNOSIS — R42 Dizziness and giddiness: Secondary | ICD-10-CM | POA: Diagnosis not present

## 2022-11-14 DIAGNOSIS — I739 Peripheral vascular disease, unspecified: Secondary | ICD-10-CM | POA: Diagnosis not present

## 2022-11-14 DIAGNOSIS — R2689 Other abnormalities of gait and mobility: Secondary | ICD-10-CM | POA: Diagnosis not present

## 2022-11-14 DIAGNOSIS — I959 Hypotension, unspecified: Secondary | ICD-10-CM | POA: Diagnosis not present

## 2022-11-14 DIAGNOSIS — G4489 Other headache syndrome: Secondary | ICD-10-CM | POA: Diagnosis not present

## 2022-11-14 DIAGNOSIS — R410 Disorientation, unspecified: Secondary | ICD-10-CM | POA: Diagnosis not present

## 2022-11-14 LAB — CBC WITH DIFFERENTIAL/PLATELET
Abs Immature Granulocytes: 0.02 10*3/uL (ref 0.00–0.07)
Basophils Absolute: 0 10*3/uL (ref 0.0–0.1)
Basophils Relative: 0 %
Eosinophils Absolute: 0.1 10*3/uL (ref 0.0–0.5)
Eosinophils Relative: 2 %
HCT: 52.8 % — ABNORMAL HIGH (ref 39.0–52.0)
Hemoglobin: 17.1 g/dL — ABNORMAL HIGH (ref 13.0–17.0)
Immature Granulocytes: 0 %
Lymphocytes Relative: 10 %
Lymphs Abs: 0.6 10*3/uL — ABNORMAL LOW (ref 0.7–4.0)
MCH: 28.8 pg (ref 26.0–34.0)
MCHC: 32.4 g/dL (ref 30.0–36.0)
MCV: 88.9 fL (ref 80.0–100.0)
Monocytes Absolute: 0.4 10*3/uL (ref 0.1–1.0)
Monocytes Relative: 7 %
Neutro Abs: 4.7 10*3/uL (ref 1.7–7.7)
Neutrophils Relative %: 81 %
Platelets: 143 10*3/uL — ABNORMAL LOW (ref 150–400)
RBC: 5.94 MIL/uL — ABNORMAL HIGH (ref 4.22–5.81)
RDW: 14.5 % (ref 11.5–15.5)
WBC: 5.8 10*3/uL (ref 4.0–10.5)
nRBC: 0 % (ref 0.0–0.2)

## 2022-11-14 LAB — URINALYSIS, ROUTINE W REFLEX MICROSCOPIC
Bilirubin Urine: NEGATIVE
Glucose, UA: NEGATIVE mg/dL
Hgb urine dipstick: NEGATIVE
Ketones, ur: NEGATIVE mg/dL
Leukocytes,Ua: NEGATIVE
Nitrite: NEGATIVE
Protein, ur: NEGATIVE mg/dL
Specific Gravity, Urine: 1.012 (ref 1.005–1.030)
pH: 6 (ref 5.0–8.0)

## 2022-11-14 LAB — I-STAT CHEM 8, ED
BUN: 52 mg/dL — ABNORMAL HIGH (ref 8–23)
Calcium, Ion: 1 mmol/L — ABNORMAL LOW (ref 1.15–1.40)
Chloride: 107 mmol/L (ref 98–111)
Creatinine, Ser: 2.1 mg/dL — ABNORMAL HIGH (ref 0.61–1.24)
Glucose, Bld: 132 mg/dL — ABNORMAL HIGH (ref 70–99)
HCT: 54 % — ABNORMAL HIGH (ref 39.0–52.0)
Hemoglobin: 18.4 g/dL — ABNORMAL HIGH (ref 13.0–17.0)
Potassium: 5.9 mmol/L — ABNORMAL HIGH (ref 3.5–5.1)
Sodium: 141 mmol/L (ref 135–145)
TCO2: 26 mmol/L (ref 22–32)

## 2022-11-14 LAB — PROTIME-INR
INR: 1.2 (ref 0.8–1.2)
Prothrombin Time: 14.6 seconds (ref 11.4–15.2)

## 2022-11-14 LAB — COMPREHENSIVE METABOLIC PANEL
ALT: 16 U/L (ref 0–44)
AST: 23 U/L (ref 15–41)
Albumin: 3.2 g/dL — ABNORMAL LOW (ref 3.5–5.0)
Alkaline Phosphatase: 59 U/L (ref 38–126)
Anion gap: 9 (ref 5–15)
BUN: 31 mg/dL — ABNORMAL HIGH (ref 8–23)
CO2: 25 mmol/L (ref 22–32)
Calcium: 8.2 mg/dL — ABNORMAL LOW (ref 8.9–10.3)
Chloride: 107 mmol/L (ref 98–111)
Creatinine, Ser: 1.97 mg/dL — ABNORMAL HIGH (ref 0.61–1.24)
GFR, Estimated: 33 mL/min — ABNORMAL LOW (ref >60)
Glucose, Bld: 102 mg/dL — ABNORMAL HIGH (ref 70–99)
Potassium: 4.3 mmol/L (ref 3.5–5.1)
Sodium: 141 mmol/L (ref 135–145)
Total Bilirubin: 0.5 mg/dL (ref 0.3–1.2)
Total Protein: 5.6 g/dL — ABNORMAL LOW (ref 6.5–8.1)

## 2022-11-14 LAB — MAGNESIUM: Magnesium: 2 mg/dL (ref 1.7–2.4)

## 2022-11-14 LAB — POTASSIUM: Potassium: 4.4 mmol/L (ref 3.5–5.1)

## 2022-11-14 LAB — TROPONIN I (HIGH SENSITIVITY)
Troponin I (High Sensitivity): 98 ng/L — ABNORMAL HIGH (ref <18)
Troponin I (High Sensitivity): 97 ng/L — ABNORMAL HIGH (ref <18)

## 2022-11-14 LAB — CBG MONITORING, ED: Glucose-Capillary: 145 mg/dL — ABNORMAL HIGH (ref 70–99)

## 2022-11-14 MED ORDER — HYDRALAZINE HCL 25 MG PO TABS: 25.0000 mg | ORAL_TABLET | Freq: Four times a day (QID) | ORAL | Status: DC | PRN

## 2022-11-14 MED ORDER — BRIMONIDINE TARTRATE 0.15 % OP SOLN
1.0000 [drp] | Freq: Two times a day (BID) | OPHTHALMIC | Status: DC
  Administered 2022-11-14 – 2022-11-15 (×2): 1 [drp] via OPHTHALMIC
  Filled 2022-11-14: qty 5

## 2022-11-14 MED ORDER — SODIUM CHLORIDE 0.9% FLUSH
3.0000 mL | Freq: Two times a day (BID) | INTRAVENOUS | Status: DC
  Administered 2022-11-14 – 2022-11-15 (×2): 3 mL via INTRAVENOUS

## 2022-11-14 MED ORDER — LATANOPROST 0.005 % OP SOLN
1.0000 [drp] | Freq: Every day | OPHTHALMIC | Status: DC
  Administered 2022-11-14: 1 [drp] via OPHTHALMIC
  Filled 2022-11-14: qty 2.5

## 2022-11-14 MED ORDER — EZETIMIBE 10 MG PO TABS
10.0000 mg | ORAL_TABLET | Freq: Every day | ORAL | Status: DC
  Administered 2022-11-14 – 2022-11-15 (×2): 10 mg via ORAL
  Filled 2022-11-14 (×2): qty 1

## 2022-11-14 MED ORDER — HEPARIN SODIUM (PORCINE) 5000 UNIT/ML IJ SOLN
5000.0000 [IU] | Freq: Two times a day (BID) | INTRAMUSCULAR | Status: DC
  Administered 2022-11-14 – 2022-11-15 (×2): 5000 [IU] via SUBCUTANEOUS
  Filled 2022-11-14 (×2): qty 1

## 2022-11-14 MED ORDER — ARTIFICIAL TEARS OPHTHALMIC OINT
1.0000 | TOPICAL_OINTMENT | Freq: Every day | OPHTHALMIC | Status: DC
  Administered 2022-11-14: 1 via OPHTHALMIC
  Filled 2022-11-14: qty 3.5

## 2022-11-14 MED ORDER — TRANDOLAPRIL 1 MG PO TABS
4.0000 mg | ORAL_TABLET | Freq: Every morning | ORAL | Status: DC
  Administered 2022-11-15: 4 mg via ORAL
  Filled 2022-11-14: qty 4

## 2022-11-14 MED ORDER — TIMOLOL MALEATE 0.5 % OP SOLN
1.0000 [drp] | Freq: Two times a day (BID) | OPHTHALMIC | Status: DC
  Administered 2022-11-14 – 2022-11-15 (×2): 1 [drp] via OPHTHALMIC
  Filled 2022-11-14: qty 5

## 2022-11-14 MED ORDER — AMLODIPINE BESYLATE 5 MG PO TABS: 5.0000 mg | ORAL_TABLET | Freq: Every day | ORAL | Status: DC

## 2022-11-14 MED ORDER — SODIUM ZIRCONIUM CYCLOSILICATE 5 G PO PACK
5.0000 g | PACK | Freq: Once | ORAL | Status: AC
  Administered 2022-11-14: 5 g via ORAL
  Filled 2022-11-14: qty 1

## 2022-11-14 MED ORDER — LACTATED RINGERS IV BOLUS
500.0000 mL | Freq: Once | INTRAVENOUS | Status: AC
  Administered 2022-11-14: 500 mL via INTRAVENOUS

## 2022-11-14 MED ORDER — BRIMONIDINE TARTRATE-TIMOLOL 0.2-0.5 % OP SOLN: 1.0000 [drp] | Freq: Two times a day (BID) | OPHTHALMIC | Status: DC

## 2022-11-14 MED ORDER — METOPROLOL SUCCINATE ER 100 MG PO TB24
100.0000 mg | ORAL_TABLET | Freq: Every day | ORAL | Status: DC
  Administered 2022-11-15: 100 mg via ORAL
  Filled 2022-11-14: qty 1

## 2022-11-14 MED ORDER — DIPHENHYDRAMINE HCL 25 MG PO CAPS
25.0000 mg | ORAL_CAPSULE | Freq: Once | ORAL | Status: AC | PRN
  Administered 2022-11-14: 25 mg via ORAL
  Filled 2022-11-14: qty 1

## 2022-11-14 MED ORDER — ASPIRIN 81 MG PO TBEC
81.0000 mg | DELAYED_RELEASE_TABLET | Freq: Every day | ORAL | Status: DC
  Administered 2022-11-14 – 2022-11-15 (×2): 81 mg via ORAL
  Filled 2022-11-14 (×2): qty 1

## 2022-11-14 MED ORDER — POLYVINYL ALCOHOL 1.4 % OP SOLN
1.0000 [drp] | Freq: Three times a day (TID) | OPHTHALMIC | Status: DC
  Administered 2022-11-15: 1 [drp] via OPHTHALMIC
  Filled 2022-11-14: qty 15

## 2022-11-14 NOTE — ED Notes (Signed)
Patient transported to CT 

## 2022-11-14 NOTE — ED Provider Notes (Signed)
[facility] EMERGENCY DEPARTMENT Provider Note   CSN: [id number] Arrival date & time: 11/14/22  1039     History  Chief Complaint  Patient presents with   Loss of Consciousness    Steven Myers is a 87 y.o. male.  HPI Patient presents after syncopal episode.  Medical history includes RBBB, HTN, DM, HLD, gastritis, CKD, CAD.  He states that he woke up feeling woozy.  While sitting at the kitchen table, he had a loss of consciousness.  This was witnessed by his wife at home.  He slumped forward onto the table and was unresponsive for 2 to 3 minutes.  He states that he continued to feel woozy when he came to.  When first responders arrived on scene, he was bradycardic and hypotensive.  Heart rate was approximately 40.  When ambulance team arrived, heart rate had improved.  Initial twelve-lead shows RBBB with rate of 66.  Blood pressure improved during transit to 90s SBP.  He remained alert and oriented.  Currently, endorses fatigue and generalized weakness only.  He states that he did take his morning medications today.  Per chart review, this includes amlodipine, ASA, Lasix, metoprolol, and trandolapril.  He is followed by [facility].  His cardiologist is Dr. Herbie Baltimore.  He most recently had echocardiogram 2 months ago.  It showed diminished LVEF as well as diastolic dysfunction.    Home Medications Prior to Admission medications   Medication Sig Start Date End Date Taking? Authorizing Provider  amLODipine (NORVASC) 5 MG tablet Take 5 mg by mouth daily. 04/03/18  Yes [provider]  artificial tears (LACRILUBE) OINT ophthalmic ointment Place 1 Application into both eyes See admin instructions. Place as directed into both eyes as directed at bedtime- 5 minutes after Travoprost   Yes [provider]  Artificial Tears ophthalmic solution Place 1 drop into both eyes 3 (three) times daily.   Yes [provider]  aspirin 81 MG tablet Take 81 mg by mouth  daily.     Yes [provider]  brimonidine-timolol (COMBIGAN) 0.2-0.5 % ophthalmic solution Place 1 drop into both eyes in the morning and at bedtime.   Yes [provider]  ezetimibe (ZETIA) 10 MG tablet Take 10 mg by mouth daily. 03/21/18  Yes [provider]  furosemide (LASIX) 20 MG tablet take 1 tablet by mouth twice a day Patient taking differently: Take 20 mg by mouth 2 (two) times daily. 07/08/12  Yes Wendall Stade, MD  metoprolol succinate (TOPROL-XL) 100 MG 24 hr tablet take 1 tablet by mouth once daily Patient taking differently: Take 100 mg by mouth daily. 05/28/12  Yes McClung, Angela M, PA-C  trandolapril (MAVIK) 4 MG tablet take 1 tablet by mouth twice a day NEEDS OFFICE VISIT/LABS FOR REFILLS Patient taking differently: Take 4 mg by mouth in the morning. 07/13/12  Yes Dunn, Raymon Mutton, PA-C  Travoprost, BAK Free, (TRAVATAN Z) 0.004 % SOLN ophthalmic solution Place 1 drop into both eyes at bedtime.   Yes [provider]      Allergies    Patient has no known allergies.    Review of Systems   Review of Systems  Constitutional:  Positive for fatigue.  Neurological:  Positive for syncope and weakness (Generalized).  All other systems reviewed and are negative.   Physical Exam Updated Vital Signs BP 136/82   Pulse 72   Temp 98.2 F (36.8 C) (Oral)   Resp (!) 24   Ht 6'  1" (1.854 m)   Wt 94.3 kg   SpO2 100%   BMI 27.43 kg/m  Physical Exam Vitals and nursing note reviewed.  Constitutional:      General: He is not in acute distress.    Appearance: Normal appearance. He is well-developed. He is not ill-appearing, toxic-appearing or diaphoretic.  HENT:     Head: Normocephalic and atraumatic.     Right Ear: External ear normal.     Left Ear: External ear normal.     Nose: Nose normal.     Mouth/Throat:     Mouth: Mucous membranes are moist.     Pharynx: Oropharynx is clear.  Eyes:     Extraocular Movements: Extraocular movements  intact.     Conjunctiva/sclera: Conjunctivae normal.  Cardiovascular:     Rate and Rhythm: Normal rate and regular rhythm.     Heart sounds: No murmur heard. Pulmonary:     Effort: Pulmonary effort is normal. No respiratory distress.     Breath sounds: Normal breath sounds. No wheezing or rales.  Chest:     Chest wall: No tenderness.  Abdominal:     General: There is no distension.     Palpations: Abdomen is soft.     Tenderness: There is no abdominal tenderness.  Musculoskeletal:        General: No swelling.     Cervical back: Normal range of motion and neck supple.     Right lower leg: Edema present.     Left lower leg: Edema present.  Skin:    General: Skin is warm and dry.     Capillary Refill: Capillary refill takes less than 2 seconds.     Coloration: Skin is not jaundiced or pale.  Neurological:     General: No focal deficit present.     Mental Status: He is alert and oriented to person, place, and time.     Cranial Nerves: No cranial nerve deficit.     Sensory: No sensory deficit.     Motor: Weakness (Global) present.  Psychiatric:        Mood and Affect: Mood normal.        Behavior: Behavior normal.     ED Results / Procedures / Treatments   Labs (all labs ordered are listed, but only abnormal results are displayed) Labs Reviewed  CBC WITH DIFFERENTIAL/PLATELET - Abnormal; Notable for the following components:      Result Value   RBC 5.94 (*)    Hemoglobin 17.1 (*)    HCT 52.8 (*)    Platelets 143 (*)    Lymphs Abs 0.6 (*)    All other components within normal limits  CBG MONITORING, ED - Abnormal; Notable for the following components:   Glucose-Capillary 145 (*)    All other components within normal limits  I-STAT CHEM 8, ED - Abnormal; Notable for the following components:   Potassium 5.9 (*)    BUN 52 (*)    Creatinine, Ser 2.10 (*)    Glucose, Bld 132 (*)    Calcium, Ion 1.00 (*)    Hemoglobin 18.4 (*)    HCT 54.0 (*)    All other components  within normal limits  TROPONIN I (HIGH SENSITIVITY) - Abnormal; Notable for the following components:   Troponin I (High Sensitivity) 98 (*)    All other components within normal limits  TROPONIN I (HIGH SENSITIVITY) - Abnormal; Notable for the following components:   Troponin I (High Sensitivity) 97 (*)  All other components within normal limits  PROTIME-INR  URINALYSIS, ROUTINE W REFLEX MICROSCOPIC  COMPREHENSIVE METABOLIC PANEL  MAGNESIUM  POTASSIUM    EKG EKG Interpretation  Date/Time:  Saturday November 14 2022 10:56:05 EST Ventricular Rate:  78 PR Interval:  185 QRS Duration: 143 QT Interval:  447 QTC Calculation: 510 R Axis:   256 Text Interpretation: Sinus rhythm Ventricular premature complex Right bundle branch block Confirmed by Gloris Manchesterixon, Anakin Varkey [phone] on 11/14/2022 11:43:29 AM  Radiology CT HEAD WO CONTRAST  Result Date: 11/14/2022 CLINICAL DATA:  Syncope/presyncope EXAM: CT HEAD WITHOUT CONTRAST TECHNIQUE: Contiguous axial images were obtained from the base of the skull through the vertex without intravenous contrast. RADIATION DOSE REDUCTION: This exam was performed according to the departmental dose-optimization program which includes automated exposure control, adjustment of the mA and/or kV according to patient size and/or use of iterative reconstruction technique. COMPARISON:  06/11/2022 FINDINGS: Brain: No acute intracranial findings are seen. Cortical sulci are prominent. There is decreased density in periventricular and subcortical white matter. There is no focal edema or mass effect. Vascular: Unremarkable. Skull: Unremarkable. Sinuses/Orbits: Unremarkable. Other: There is increased amount of CSF in the sella suggesting partial empty sella. IMPRESSION: No acute intracranial findings are seen in noncontrast CT brain. Atrophy. Small-vessel disease. Electronically Signed   By: Ernie AvenaPalani  Rathinasamy M.D.   On: 11/14/2022 12:12   DG Chest Port 1 View  Result Date:  11/14/2022 CLINICAL DATA:  Syncope. Dizziness and weakness which began this morning. EXAM: PORTABLE CHEST 1 VIEW COMPARISON:  06/11/2022 FINDINGS: Heart size and mediastinal contours are normal. No pleural effusion or edema. No airspace opacities identified. Visualized osseous structures are unremarkable. IMPRESSION: No active disease. Electronically Signed   By: Signa Kellaylor  Stroud M.D.   On: 11/14/2022 11:43    Procedures Procedures    Medications Ordered in ED Medications  aspirin EC tablet 81 mg (has no administration in time range)  amLODipine (NORVASC) tablet 5 mg (has no administration in time range)  ezetimibe (ZETIA) tablet 10 mg (has no administration in time range)  metoprolol succinate (TOPROL-XL) 24 hr tablet 100 mg (has no administration in time range)  trandolapril (MAVIK) tablet 4 mg (has no administration in time range)  artificial tears (LACRILUBE) ophthalmic ointment 1 Application (has no administration in time range)  Artificial Tears SOLN 1 drop (has no administration in time range)  brimonidine-timolol (COMBIGAN) 0.2-0.5 % ophthalmic solution 1 drop (has no administration in time range)  latanoprost (XALATAN) 0.005 % ophthalmic solution 1 drop (has no administration in time range)  sodium chloride flush (NS) 0.9 % injection 3 mL (has no administration in time range)  heparin injection 5,000 Units (has no administration in time range)  hydrALAZINE (APRESOLINE) tablet 25 mg (has no administration in time range)  sodium zirconium cyclosilicate (LOKELMA) packet 5 g (has no administration in time range)  lactated ringers bolus 500 mL (0 mLs Intravenous Stopped 11/14/22 1224)    ED Course/ Medical Decision Making/ A&P                           Medical Decision Making Amount and/or Complexity of Data Reviewed Labs: ordered. Radiology: ordered. ECG/medicine tests: ordered.   This patient presents to the ED for concern of syncope, this involves an extensive number of  treatment options, and is a complaint that carries with it a high risk of complications and morbidity.  The differential diagnosis includes arrhythmia, dehydration, polypharmacy, anemia, seizure, TIA  Co morbidities that complicate the patient evaluation  RBBB, HTN, DM, HLD, gastritis, CKD, CAD   Additional history obtained:  Additional history obtained from EMS External records from outside source obtained and reviewed including EMR   Lab Tests:  I Ordered, and personally interpreted labs.  The pertinent results include: Elevated hemoglobin suggestive of hemoconcentration; increased creatinine with mild hyperkalemia on i-STAT.  Mild elevation in troponin with no change on delta.   Imaging Studies ordered:  I ordered imaging studies including chest x-ray, CT head I independently visualized and interpreted imaging which showed no acute findings I agree with the radiologist interpretation   Cardiac Monitoring: / EKG:  The patient was maintained on a cardiac monitor.  I personally viewed and interpreted the cardiac monitored which showed an underlying rhythm of: Sinus rhythm  Problem List / ED Course / Critical interventions / Medication management  Patient presents after syncopal episode.  This was witnessed at home.  Per EMS, it lasted for several minutes.  Patient reported feeling woozy since waking up this morning with continued generalized weakness upon arrival in the ED.  EMS noted hypotension and bradycardia on scene.  These improved during transit.  On arrival, SBP's are in the range of 90.  Patient has no focal neurologic deficits on exam.  Lungs are clear to auscultation.  Breathing is unlabored.  He received 500 cc IVF prior to arrival.  Given his ongoing saw blood pressures, additional IV fluids were ordered.  Patient was kept on bedside cardiac monitor and laboratory workup was initiated.  Patient underwent x-ray of chest and CT of head which did not show any acute  findings.  He remained in normal sinus rhythm on monitor.  Lab work shows increased hemoglobin suggestive of hemoconcentration.  Patient does report not drinking much water, taking his daily Lasix, and having 2-3 episodes of diarrhea recently.  On i-STAT, there is concern of AKI and hyperkalemia.  Plan is to wait for confirmation of hyperkalemia on CMP prior to treatment.  Unfortunately, initial CMP was hemolyzed and repeat lab work had to be sent.  Patient was admitted to hospitalist for further management. I ordered medication including IV fluids for hydration Reevaluation of the patient after these medicines showed that the patient improved I have reviewed the patients home medicines and have made adjustments as needed   Social Determinants of Health:  Lives independently         Final Clinical Impression(s) / ED Diagnoses Final diagnoses:  Syncope, unspecified syncope type  Dehydration    Rx / DC Orders ED Discharge Orders     None         Gloris Manchester, MD 11/14/22 1607

## 2022-11-14 NOTE — ED Triage Notes (Signed)
Pt arrived via [facility] from home. Pt had a witnessed syncopal episode while sitting at table. Per EMS, pt's wife stated pt did not fall. Pt initial bp was systolic 50 palp. Last bp 86/56, p-76. EMS gave NS 500 ML. Pt is A&Ox4.

## 2022-11-14 NOTE — ED Notes (Signed)
Pt attempted to urinate, but was unable to urinate at this time

## 2022-11-14 NOTE — H&P (Signed)
History and Physical    Spencer Cardinal CBU:[id number] DOB: [birth year] DOA: 11/14/2022  PCP: Renford Dills, MD (Confirm with patient/family/NH records and if not entered, this has to be entered at [facility] point of entry) Patient coming from: Home  I have personally briefly reviewed patient's old medical records in [facility]  Chief Complaint: Feeling better  HPI: Joenathan Sakuma is a 87 y.o. male with medical history significant of nonischemic cardiomyopathy secondary to questionable amyloidosis cardiac, with mild decreased LVEF 45-54% on stress test, with severe LVH and diastolic dysfunction, severe RV function reduction/core pulmonology, CKD stage IIIa, presented with syncope episode.  Patient reported nighttime pain not taking enough p.o. fluid for the last 1 to 2 days and started to have lightheadedness this morning, while at breakfast table, patient started to feel lightheadedness blurry vision and then slumped back to the chair unresponsive for few minutes, no fall.  Recovered consciousness himself, denies any chest pain shortness of breath leg swelling recent weight changes, and has been on the same regimen of CHF medication including beta-blocker, amlodipine, ACEI and Lasix chronically.  EMS around found patient in bradycardia heart rate in the 40s, blood pressure SBP 70s.  No hypoxia.  EMS gave 800 mL IV bolus, blood pressure improved to 86/56 on arrival to ED.  More awake, no significant complaints.  CT head negative for acute findings, EKG showed sinus rhythm, no ischemic changes, blood work showed hemoconcentration hemoglobin 17 compared to baseline less than 15, K5.9, creatinine 2.1 compared to baseline 1.5-1.7 several months ago.  Patient was given another 500 mL bolus of IV fluid and blood pressure further improved no more bradycardia.  Review of Systems: As per HPI otherwise 14 point review of systems negative.    Past Medical History:  Diagnosis Date   Bilateral cataracts     Cardiomyopathy (HCC)    a. 05/2018 Echo: EF 60-65%, no rwma, GrI DD. Mildly dil Ao root. Mild BAE; b. 05/2022 Echo: EF 45-50%, inferior basal HK. Sev LVH, GrI DD, mildly enlarged RV w/ mildly reduced fxn. Trivm MR. AoV sclerosis. Asc Ao 45mm.   Chest pain    a. 03/2007 MV: No isch/infarct; b. 04/2010 Lexi MV: EF 62%. No isch/infarct.   CKD (chronic kidney disease) stage 3, GFR 30-59 ml/min (HCC)    DM (diabetes mellitus) type II controlled with renal manifestation (HCC)    Not on insulin   Essential hypertension    Glaucoma    Optho: Dr. Cathey Endow   History of colonoscopy    Hyperlipidemia associated with type 2 diabetes mellitus (HCC)    Intolerant to statins --> because of elevated LFTs.   Obesity due to excess calories without serious comorbidity    BMI ~32 - mostly truncal obesity (mesomorphic)   Osteoarthritis of hip    Right > Left -- limits walking   RBBB (right bundle branch block)    Venous stasis dermatitis of both lower extremities - with edema    L leg worse than R 2/2 prior injury.; controlled somewhat with support hose    Past Surgical History:  Procedure Laterality Date   NM MYOVIEW LTD  04/2010   LEXISCAN: EF 62%. NO ISCHEMIA OR INFARCTION. LOW RISK. No RWMA      reports that he quit smoking about 44 years ago. His smoking use included cigarettes. He has never used smokeless tobacco. He reports that he does not currently use alcohol. He reports that he does not use drugs.  No Known Allergies  Family  History  Problem Relation Age of Onset   Diabetes Mother    Heart disease Father [id number]   Colon cancer Neg Hx     Prior to Admission medications   Medication Sig Start Date End Date Taking? Authorizing Provider  amLODipine (NORVASC) 5 MG tablet Take 5 mg by mouth daily. 04/03/18  Yes [provider]  artificial tears (LACRILUBE) OINT ophthalmic ointment Place 1 Application into both eyes See admin instructions. Place as directed into both eyes as directed at bedtime-  5 minutes after Travoprost   Yes [provider]  Artificial Tears ophthalmic solution Place 1 drop into both eyes 3 (three) times daily.   Yes [provider]  aspirin 81 MG tablet Take 81 mg by mouth daily.     Yes [provider]  brimonidine-timolol (COMBIGAN) 0.2-0.5 % ophthalmic solution Place 1 drop into both eyes in the morning and at bedtime.   Yes [provider]  ezetimibe (ZETIA) 10 MG tablet Take 10 mg by mouth daily. 03/21/18  Yes [provider]  furosemide (LASIX) 20 MG tablet take 1 tablet by mouth twice a day Patient taking differently: Take 20 mg by mouth 2 (two) times daily. 07/08/12  Yes Wendall StadeNishan, Peter C, MD  metoprolol succinate (TOPROL-XL) 100 MG 24 hr tablet take 1 tablet by mouth once daily Patient taking differently: Take 100 mg by mouth daily. 05/28/12  Yes McClung, Angela M, PA-C  trandolapril (MAVIK) 4 MG tablet take 1 tablet by mouth twice a day NEEDS OFFICE VISIT/LABS FOR REFILLS Patient taking differently: Take 4 mg by mouth in the morning. 07/13/12  Yes Dunn, Raymon Muttonyan M, PA-C  Travoprost, BAK Free, (TRAVATAN Z) 0.004 % SOLN ophthalmic solution Place 1 drop into both eyes at bedtime.   Yes [provider]    Physical Exam: Vitals:   11/14/22 1430 11/14/22 1500 11/14/22 1530 11/14/22 1549  BP: 129/89 124/87 136/82   Pulse: 70 76 72   Resp: 20 (!) 21 (!) 24   Temp:    98.2 F (36.8 C)  TempSrc:    Oral  SpO2: 98% 97% 100%   Weight:      Height:        Constitutional: NAD, calm, comfortable Vitals:   11/14/22 1430 11/14/22 1500 11/14/22 1530 11/14/22 1549  BP: 129/89 124/87 136/82   Pulse: 70 76 72   Resp: 20 (!) 21 (!) 24   Temp:    98.2 F (36.8 C)  TempSrc:    Oral  SpO2: 98% 97% 100%   Weight:      Height:       Eyes: PERRL, lids and conjunctivae normal ENMT: Mucous membranes are moist. Posterior pharynx clear of any exudate or lesions.Normal dentition.  Neck: normal, supple, no masses, no  thyromegaly Respiratory: clear to auscultation bilaterally, no wheezing, no crackles. Normal respiratory effort. No accessory muscle use.  Cardiovascular: Regular rate and rhythm, no murmurs / rubs / gallops. No extremity edema. 2+ pedal pulses. No carotid bruits.  Abdomen: no tenderness, no masses palpated. No hepatosplenomegaly. Bowel sounds positive.  Musculoskeletal: no clubbing / cyanosis. No joint deformity upper and lower extremities. Good ROM, no contractures. Normal muscle tone.  Skin: no rashes, lesions, ulcers. No induration Neurologic: CN 2-12 grossly intact. Sensation intact, DTR normal. Strength 5/5 in all 4.  Psychiatric: Normal judgment and insight. Alert and oriented x 3. Normal mood.     Labs on Admission: I have personally reviewed following labs and imaging studies  CBC: Recent Labs  Lab 11/14/22 1143 11/14/22 1205  WBC 5.8  --   NEUTROABS 4.7  --   HGB 17.1* 18.4*  HCT 52.8* 54.0*  MCV 88.9  --   PLT 143*  --    Basic Metabolic Panel: Recent Labs  Lab 11/14/22 1205  NA 141  K 5.9*  CL 107  GLUCOSE 132*  BUN 52*  CREATININE 2.10*   GFR: Estimated Creatinine Clearance: 29.1 mL/min (A) (by C-G formula based on SCr of 2.1 mg/dL (H)). Liver Function Tests: No results for input(s): "AST", "ALT", "ALKPHOS", "BILITOT", "PROT", "ALBUMIN" in the last 168 hours. No results for input(s): "LIPASE", "AMYLASE" in the last 168 hours. No results for input(s): "AMMONIA" in the last 168 hours. Coagulation Profile: Recent Labs  Lab 11/14/22 1143  INR 1.2   Cardiac Enzymes: No results for input(s): "CKTOTAL", "CKMB", "CKMBINDEX", "TROPONINI" in the last 168 hours. BNP (last 3 results) No results for input(s): "PROBNP" in the last 8760 hours. HbA1C: No results for input(s): "HGBA1C" in the last 72 hours. CBG: Recent Labs  Lab 11/14/22 1249  GLUCAP 145*   Lipid Profile: No results for input(s): "CHOL", "HDL", "LDLCALC", "TRIG", "CHOLHDL", "LDLDIRECT" in the  last 72 hours. Thyroid Function Tests: No results for input(s): "TSH", "T4TOTAL", "FREET4", "T3FREE", "THYROIDAB" in the last 72 hours. Anemia Panel: No results for input(s): "VITAMINB12", "FOLATE", "FERRITIN", "TIBC", "IRON", "RETICCTPCT" in the last 72 hours. Urine analysis:    Component Value Date/Time   COLORURINE YELLOW 11/14/2022 1052   APPEARANCEUR CLEAR 11/14/2022 1052   LABSPEC 1.012 11/14/2022 1052   PHURINE 6.0 11/14/2022 1052   GLUCOSEU NEGATIVE 11/14/2022 1052   HGBUR NEGATIVE 11/14/2022 1052   BILIRUBINUR NEGATIVE 11/14/2022 1052   KETONESUR NEGATIVE 11/14/2022 1052   PROTEINUR NEGATIVE 11/14/2022 1052   UROBILINOGEN 1.0 01/26/2012 2310   NITRITE NEGATIVE 11/14/2022 1052   LEUKOCYTESUR NEGATIVE 11/14/2022 1052    Radiological Exams on Admission: CT HEAD WO CONTRAST  Result Date: 11/14/2022 CLINICAL DATA:  Syncope/presyncope EXAM: CT HEAD WITHOUT CONTRAST TECHNIQUE: Contiguous axial images were obtained from the base of the skull through the vertex without intravenous contrast. RADIATION DOSE REDUCTION: This exam was performed according to the departmental dose-optimization program which includes automated exposure control, adjustment of the mA and/or kV according to patient size and/or use of iterative reconstruction technique. COMPARISON:  06/11/2022 FINDINGS: Brain: No acute intracranial findings are seen. Cortical sulci are prominent. There is decreased density in periventricular and subcortical white matter. There is no focal edema or mass effect. Vascular: Unremarkable. Skull: Unremarkable. Sinuses/Orbits: Unremarkable. Other: There is increased amount of CSF in the sella suggesting partial empty sella. IMPRESSION: No acute intracranial findings are seen in noncontrast CT brain. Atrophy. Small-vessel disease. Electronically Signed   By: Ernie Avena M.D.   On: 11/14/2022 12:12   DG Chest Port 1 View  Result Date: 11/14/2022 CLINICAL DATA:  Syncope. Dizziness  and weakness which began this morning. EXAM: PORTABLE CHEST 1 VIEW COMPARISON:  06/11/2022 FINDINGS: Heart size and mediastinal contours are normal. No pleural effusion or edema. No airspace opacities identified. Visualized osseous structures are unremarkable. IMPRESSION: No active disease. Electronically Signed   By: Signa Kell M.D.   On: 11/14/2022 11:43    EKG: Independently reviewed.  Sinus, chronic RBBB, no acute ST changes.  Assessment/Plan Principal Problem:   Syncope Active Problems:   Syncope, vasovagal   Acute on chronic combined systolic and diastolic CHF (congestive heart failure) (HCC)  (please populate well all problems  here in Problem List. (For example, if patient is on BP meds at home and you resume or decide to hold them, it is a problem that needs to be her. Same for CAD, COPD, HLD and so on)  Syncope -Vasovagal, likely secondary to symptomatic hypotension, combined with volume contraction/dehydration probably from poor oral intake. -Symptoms resolved after IV fluid with EMS and ED total of 1000 mL. -Hold off CHF/BP medication tonight, resume home BP medication tomorrow -As needed hydralazine for -Hold off Lasix -Given the clear clinical presentation and recent complete cardiac workup including echocardiogram stress test, will not pursue further cardiology workup at this time. -Orthostatic vital signs, PT evaluation  Chronic combined HFpEF and HFrEF -Clinically patient volume contracted hold off CHF/BP medication as above -Next cardiology visit 2 weeks from now, recommend patient call his cardiology after discharge, versus check BP frequently at home to modify his home CHF/BP medications.  AKI on CKD stage III -Volume contracted, hold off BP/send medication tonight, resume home BP medication tomorrow if creatinine level improves. -Will not continue maintenance IV fluids given strong history of CHF, encourage p.o. intake today, and patient  agreed.  Hyperkalemia -Secondary to AKI and volume contraction as well as continue use of ACEI -Mild, no significant T wave changes on EKG -1 dose Lokelma given, recheck K level tonight and tomorrow morning -Hold off ACEI, resume ACEI's tomorrow if K level normalized.  DVT prophylaxis: Heparin subcu Code Status: Full code Family Communication: None at bedside Disposition Plan: Expect less than 2 midnight hospital stay Consults called: None Admission status: Tele obs   Emeline General MD [facility] Pager [phone]  11/14/2022, 4:00 PM

## 2022-11-15 DIAGNOSIS — R55 Syncope and collapse: Secondary | ICD-10-CM | POA: Diagnosis not present

## 2022-11-15 LAB — BASIC METABOLIC PANEL
Anion gap: 8 (ref 5–15)
BUN: 29 mg/dL — ABNORMAL HIGH (ref 8–23)
CO2: 23 mmol/L (ref 22–32)
Calcium: 8.1 mg/dL — ABNORMAL LOW (ref 8.9–10.3)
Chloride: 109 mmol/L (ref 98–111)
Creatinine, Ser: 1.82 mg/dL — ABNORMAL HIGH (ref 0.61–1.24)
GFR, Estimated: 36 mL/min — ABNORMAL LOW (ref >60)
Glucose, Bld: 107 mg/dL — ABNORMAL HIGH (ref 70–99)
Potassium: 4 mmol/L (ref 3.5–5.1)
Sodium: 140 mmol/L (ref 135–145)

## 2022-11-15 LAB — CBC
HCT: 47.1 % (ref 39.0–52.0)
Hemoglobin: 15.2 g/dL (ref 13.0–17.0)
MCH: 28.6 pg (ref 26.0–34.0)
MCHC: 32.3 g/dL (ref 30.0–36.0)
MCV: 88.7 fL (ref 80.0–100.0)
Platelets: 139 10*3/uL — ABNORMAL LOW (ref 150–400)
RBC: 5.31 MIL/uL (ref 4.22–5.81)
RDW: 14.4 % (ref 11.5–15.5)
WBC: 5.3 10*3/uL (ref 4.0–10.5)
nRBC: 0 % (ref 0.0–0.2)

## 2022-11-15 LAB — GLUCOSE, CAPILLARY: Glucose-Capillary: 89 mg/dL (ref 70–99)

## 2022-11-15 MED ORDER — FUROSEMIDE 20 MG PO TABS: 20.0000 mg | ORAL_TABLET | Freq: Every day | ORAL | 6 refills | Status: DC | PRN

## 2022-11-15 NOTE — Evaluation (Signed)
Physical Therapy Evaluation & Discharge Patient Details Name: Steven Myers MRN: [id number] DOB: Apr 06, 1935 Today's Date: 11/15/2022  History of Present Illness  Pt is an 87 y.o. male admitted 11/14/22 with syncopal episode; suspect vasovagal response secondary to symptomatic hypotension with dehydration. Head CT negative for acute findings. PMH includes NICM, CKD 3, DM, HTN, glaucoma, cataracts, OA.   Clinical Impression  Patient evaluated by Physical Therapy with no further acute PT needs identified. PTA, pt reports mod indep with intermittent use of SPC, lives with wife who assists with ADL/iADLs as needed. Today, pt requiring intermittent min guard for balance with standing mobility and ADL tasks; pt denies dizziness, reports mild SOB baseline. Pt preparing for d/c home, denies need for follow-up PT services. All education has been completed and the patient has no further questions. Acute PT is signing off. Thank you for this referral.     Recommendations for follow up therapy are one component of a multi-disciplinary discharge planning process, led by the attending physician.  Recommendations may be updated based on patient status, additional functional criteria and insurance authorization.  Follow Up Recommendations No PT follow up (pt declined)      Assistance Recommended at Discharge Frequent or constant Supervision/Assistance  Patient can return home with the following  A little help with bathing/dressing/bathroom;Assistance with cooking/housework;Assist for transportation;Help with stairs or ramp for entrance    Equipment Recommendations None recommended by PT  Recommendations for Other Services  Mobility Specialist     Functional Status Assessment       Precautions / Restrictions Precautions Precautions: Fall Restrictions Weight Bearing Restrictions: No      Mobility  Bed Mobility               General bed mobility comments: received sitting in recliner     Transfers Overall transfer level: Needs assistance Equipment used: None Transfers: Sit to/from Stand Sit to Stand: Min guard           General transfer comment: min guard for balance standing without DME multiple times from recliner    Ambulation/Gait Ambulation/Gait assistance: Min guard   Assistive device: None         General Gait Details: pt walking a few steps in front of recliner performing ADL tasks in preparation for d/c home, min guard for balance without DME. pt declines further distance, reports, "I can walk"  Stairs            Wheelchair Mobility    Modified Rankin (Stroke Patients Only)       Balance Overall balance assessment: Needs assistance   Sitting balance-Leahy Scale: Good Sitting balance - Comments: indep to don pant legs and shoes sitting edge of recliner   Standing balance support: No upper extremity supported, During functional activity Standing balance-Leahy Scale: Fair Standing balance comment: min guard due to apparent instability pulling up pants, no overt LOB                             Pertinent Vitals/Pain Pain Assessment Pain Assessment: No/denies pain    Home Living Family/patient expects to be discharged to:: Private residence Living Arrangements: Spouse/significant other Available Help at Discharge: Family;Available 24 hours/day Type of Home: House Home Access: Stairs to enter Entrance Stairs-Rails: Right Entrance Stairs-Number of Steps: 3-4 Alternate Level Stairs-Number of Steps: flight Home Layout: Two level Home Equipment: Cane - single point;Shower seat;Grab bars - tub/shower;Rolling Environmental consultant (2 wheels)      Prior  Function Prior Level of Function : Independent/Modified Independent             Mobility Comments: pt reports mod indep with intermittent use of SPC ADLs Comments: reports indep with ADLs, wife assists with iADLs as needed     Hand Dominance   Dominant Hand: Right     Extremity/Trunk Assessment   Upper Extremity Assessment Upper Extremity Assessment: Overall WFL for tasks assessed    Lower Extremity Assessment Lower Extremity Assessment: Overall WFL for tasks assessed       Communication   Communication: No difficulties  Cognition Arousal/Alertness: Awake/alert Behavior During Therapy: WFL for tasks assessed/performed Overall Cognitive Status: No family/caregiver present to determine baseline cognitive functioning                                 General Comments: pt following simple commands and answering questions appropriately; suspect decreased awareness of safety/deficits        General Comments General comments (skin integrity, edema, etc.): pt denies dizziness with mobility; reports SOB (noted DOE 2/4) is baseline. pt preparing for d/c home, reports having necessary assist from family and DME; reviewed recommendations for [facility] use and fall risk reduction    Exercises     Assessment/Plan    PT Assessment Patient does not need any further PT services (discharging)  PT Problem List         PT Treatment Interventions      PT Goals (Current goals can be found in the Care Plan section)  Acute Rehab PT Goals PT Goal Formulation: All assessment and education complete, DC therapy    Frequency       Co-evaluation               AM-PAC PT "6 Clicks" Mobility  Outcome Measure Help needed turning from your back to your side while in a flat bed without using bedrails?: None Help needed moving from lying on your back to sitting on the side of a flat bed without using bedrails?: None Help needed moving to and from a bed to a chair (including a wheelchair)?: A Little Help needed standing up from a chair using your arms (e.g., wheelchair or bedside chair)?: A Little Help needed to walk in hospital room?: A Little Help needed climbing 3-5 steps with a railing? : A Little 6 Click Score: 20    End of Session    Activity Tolerance: Patient tolerated treatment well Patient left: in chair;with call bell/phone within reach;with nursing/sitter in room Nurse Communication: Mobility status PT Visit Diagnosis: Other abnormalities of gait and mobility (R26.89)    Time: 1856-3149 PT Time Calculation (min) (ACUTE ONLY): 13 min   Charges:   PT Evaluation $PT Eval Low Complexity: 1 Low        Steven Myers, PT, DPT Acute Rehabilitation Services  Personal: Secure Chat Rehab Office: [phone]  Steven Myers 11/15/2022, 1:41 PM

## 2022-11-15 NOTE — Progress Notes (Signed)
Wynelle Beckmann to be D/C'd Home per MD order.  Discussed with the patient and all questions fully answered.  VSS, Skin clean, dry and intact without evidence of skin break down, no evidence of skin tears noted. IV catheter discontinued intact. Site without signs and symptoms of complications. Dressing and pressure applied.  An After Visit Summary was printed and given to the patient. Patient prescription sent to pharmacy.  D/c education completed with patient/family including follow up instructions, medication list, d/c activities limitations if indicated, with other d/c instructions as indicated by MD - patient able to verbalize understanding, all questions fully answered.   Patient instructed to return to ED, call 911, or call MD for any changes in condition.   Patient escorted via WC, and D/C home via private auto.  Pauletta Browns 11/15/2022 11:57 AM

## 2022-11-16 NOTE — Discharge Summary (Signed)
Physician Discharge Summary   Patient: Steven Myers MRN: [id number] DOB: 1935/07/03  Admit date:     11/14/2022  Discharge date: 11/15/2022  Discharge Physician: Berle Mull  PCP: Seward Carol, MD  Recommendations at discharge: Follow-up with PCP in 1 week.   Follow-up Information     Seward Carol, MD. Schedule an appointment as soon as possible for a visit in 1 week(s).   Specialty: Internal Medicine Contact information: [address] [place] [address] [phone]                Discharge Diagnoses: Principal Problem:   Syncope Active Problems:   Syncope, vasovagal   Acute on chronic combined systolic and diastolic CHF (congestive heart failure) (HCC)  Assessment and Plan  Syncope Vasovagal, likely secondary to symptomatic hypotension, combined with volume contraction/dehydration probably from poor oral intake. Symptoms resolved after IV fluid with EMS and ED total of 1000 mL. Given the clear clinical presentation and recent complete cardiac workup including echocardiogram stress test, will not pursue further cardiology workup at this time. Orthostatic vital signs negative. PT evaluation also reassuring.   Chronic combined HFpEF and HFrEF Clinically patient volume contracted hold off CHF/BP medication as above I will hold amlodipine on discharge. Continue rest of the home medication.   AKI on CKD stage III Baseline serum creatinine around 1.7.  On presentation serum creatinine around 2.1. Currently creatinine 1.8. Changing Lasix to as needed.  Continue rest of the medication.  Hold Norvasc.   Hyperkalemia Resolved. Secondary to AKI and volume contraction as well as continue use of ACEI  Consultants:  none  Procedures performed:  none  DISCHARGE MEDICATION: Allergies as of 11/15/2022   No Known Allergies      Medication List     STOP taking these medications    amLODipine 5 MG tablet Commonly known as: NORVASC        TAKE these medications    artificial tears Oint ophthalmic ointment Commonly known as: LACRILUBE Place 1 Application into both eyes See admin instructions. Place as directed into both eyes as directed at bedtime- 5 minutes after Travoprost   Artificial Tears ophthalmic solution Place 1 drop into both eyes 3 (three) times daily.   aspirin 81 MG tablet Take 81 mg by mouth daily.   brimonidine-timolol 0.2-0.5 % ophthalmic solution Commonly known as: COMBIGAN Place 1 drop into both eyes in the morning and at bedtime.   ezetimibe 10 MG tablet Commonly known as: ZETIA Take 10 mg by mouth daily.   furosemide 20 MG tablet Commonly known as: LASIX Take 1 tablet (20 mg total) by mouth daily as needed (take 20 mg daily as needed for weight gain of 3lbs in 1 day or 5 lbs in 1 week.). What changed:  when to take this reasons to take this   metoprolol succinate 100 MG 24 hr tablet Commonly known as: TOPROL-XL take 1 tablet by mouth once daily   trandolapril 4 MG tablet Commonly known as: [place] take 1 tablet by mouth twice a day NEEDS OFFICE VISIT/LABS FOR REFILLS What changed: See the new instructions.   Travatan Z 0.004 % Soln ophthalmic solution Generic drug: Travoprost (BAK Free) Place 1 drop into both eyes at bedtime.       Disposition: Home Diet recommendation: Cardiac diet  Discharge Exam: Vitals:   11/15/22 0652 11/15/22 0705 11/15/22 0842 11/15/22 1053  BP: 98/71  (!) 141/99 (!) 146/100  Pulse: 82  80   Resp: 18 18  14   Temp: 98 F (36.7 C) 97.6 F (36.4 C)  97.6 F (36.4 C)  TempSrc: Oral Oral  Oral  SpO2: 100%  100%   Weight: 95.2 kg     Height:       General: Appear in no distress; no visible Abnormal Neck Mass Or lumps, Conjunctiva normal Cardiovascular: S1 and S2 Present, no Murmur, Respiratory: good respiratory effort, Bilateral Air entry present and CTA, no Crackles, no wheezes Abdomen: Bowel Sound present, Non tender  Extremities: no Pedal  edema Neurology: alert and oriented to time, place, and person  Filed Weights   11/14/22 1056 11/14/22 1823 11/15/22 0652  Weight: 94.3 kg 96.5 kg 95.2 kg   Condition at discharge: stable  The results of significant diagnostics from this hospitalization (including imaging, microbiology, ancillary and laboratory) are listed below for reference.   Imaging Studies: CT HEAD WO CONTRAST  Result Date: 11/14/2022 CLINICAL DATA:  Syncope/presyncope EXAM: CT HEAD WITHOUT CONTRAST TECHNIQUE: Contiguous axial images were obtained from the base of the skull through the vertex without intravenous contrast. RADIATION DOSE REDUCTION: This exam was performed according to the departmental dose-optimization program which includes automated exposure control, adjustment of the mA and/or kV according to patient size and/or use of iterative reconstruction technique. COMPARISON:  06/11/2022 FINDINGS: Brain: No acute intracranial findings are seen. Cortical sulci are prominent. There is decreased density in periventricular and subcortical white matter. There is no focal edema or mass effect. Vascular: Unremarkable. Skull: Unremarkable. Sinuses/Orbits: Unremarkable. Other: There is increased amount of CSF in the sella suggesting partial empty sella. IMPRESSION: No acute intracranial findings are seen in noncontrast CT brain. Atrophy. Small-vessel disease. Electronically Signed   By: Ernie Avena M.D.   On: 11/14/2022 12:12   DG Chest Port 1 View  Result Date: 11/14/2022 CLINICAL DATA:  Syncope. Dizziness and weakness which began this morning. EXAM: PORTABLE CHEST 1 VIEW COMPARISON:  06/11/2022 FINDINGS: Heart size and mediastinal contours are normal. No pleural effusion or edema. No airspace opacities identified. Visualized osseous structures are unremarkable. IMPRESSION: No active disease. Electronically Signed   By: Signa Kell M.D.   On: 11/14/2022 11:43    Microbiology: Results for orders placed or  performed during the hospital encounter of 06/11/22  SARS Coronavirus 2 by RT PCR (hospital order, performed in [facility] hospital lab) *cepheid single result test*     Status: Abnormal   Collection Time: 06/11/22 12:15 PM   Specimen: Nasal Swab  Result Value Ref Range Status   SARS Coronavirus 2 by RT PCR POSITIVE (A) NEGATIVE Final    Comment: (NOTE) SARS-CoV-2 target nucleic acids are DETECTED  SARS-CoV-2 RNA is generally detectable in upper respiratory specimens  during the acute phase of infection.  Positive results are indicative  of the presence of the identified virus, but do not rule out bacterial infection or co-infection with other pathogens not detected by the test.  Clinical correlation with patient history and  other diagnostic information is necessary to determine patient infection status.  The expected result is negative.  Fact Sheet for Patients:   RoadLapTop.co.za   Fact Sheet for Healthcare Providers:   http://kim-miller.com/    This test is not yet approved or cleared by the [place] FDA and  has been authorized for detection and/or diagnosis of SARS-CoV-2 by FDA under an Emergency Use Authorization (EUA).  This EUA will remain in effect (meaning this test can be used) for the duration of  the COVID-19 declaration under Section 564(b)(1)  of the Act, 21 U.S.C. section 360-bbb-3(b)(1), unless the authorization is terminated or revoked sooner.   Performed at [facility], [place] [address], [place], [place] [address]    Labs: CBC: Recent Labs  Lab 11/14/22 1143 11/14/22 1205 11/15/22 0129  WBC 5.8  --  5.3  NEUTROABS 4.7  --   --   HGB 17.1* 18.4* 15.2  HCT 52.8* 54.0* 47.1  MCV 88.9  --  88.7  PLT 143*  --  [id number]*   Basic Metabolic Panel: Recent Labs  Lab 11/14/22 1205 11/14/22 1705 11/14/22 2028 11/15/22 0129  NA 141 141  --  140  K 5.9* 4.3 4.4 4.0  CL 107 107  --  109  CO2  --  25  --   23  GLUCOSE 132* 102*  --  107*  BUN 52* 31*  --  29*  CREATININE 2.10* 1.97*  --  1.82*  CALCIUM  --  8.2*  --  8.1*  MG  --  2.0  --   --    Liver Function Tests: Recent Labs  Lab 11/14/22 1705  AST 23  ALT 16  ALKPHOS 59  BILITOT 0.5  PROT 5.6*  ALBUMIN 3.2*   CBG: Recent Labs  Lab 11/14/22 1249 11/15/22 0659  GLUCAP 145* 89    Discharge time spent: greater than 30 minutes.  Signed: Berle Mull, MD [facility] 11/15/2022

## 2022-11-17 ENCOUNTER — Telehealth: Payer: Self-pay | Admitting: General Practice

## 2022-11-17 NOTE — Telephone Encounter (Signed)
  Pt's daughter calling, she said, pt was in the hospital recently and some of his medication was changed and she wants to discuss it with a nurse

## 2022-11-17 NOTE — Telephone Encounter (Signed)
Please see Telephone encounter.  

## 2022-11-17 NOTE — Telephone Encounter (Signed)
Returned call to patients daughter who states that patient was in the ER this weekend due to dehydration and BP being very low. Patients daughter states that they believed this was due to the dehydration. Per patients daughter-the Amlodipine 5mg  was stopped at the ER visit but the patients daughter and son in law feel that since the hypotension was solely related to dehydration that they were unsure about stopping the Amlodipine. Per patients daughter patient's BP today was 144/93- and reports that this was before he took medications and states that they will recheck his BP when they check on patient this afternoon and will send MyChart with reading. Per patients daughter they wanted to check with Cardiology before making any changes with medications. Per patients daughter patient currently takes Metoprolol 100mg  daily, Trandolapril 4mg  daily, and Furosemide 20mg  once daily. Also per patients daughter patient is not always compliant with medications. Will forward to Dr. for him to review and advise.   Message forwarded over to MD.

## 2022-11-20 NOTE — Telephone Encounter (Signed)
Question was  answered  through mychart patient advise request on 11/20/22 by Dr Herbie Baltimore

## 2022-11-20 NOTE — Telephone Encounter (Signed)
Not unreasonable to hold amlodipine for now.  Provided the blood pressures do not get a above 160, I would say probably safe to stay off of it. If having a hard time with him getting enough hydration, I rather his blood pressure to be higher.  Bryan Lemma, MD

## 2022-11-23 NOTE — Telephone Encounter (Signed)
If there was concern about dehydration - would change Lasix to PRN dosing for edema/orthopnea  or wgt gain > 3 lb in 1 day.  Split Amlodipine & Trandolapril to tha 1 is AM & the other is PM. [facility]

## 2022-11-26 NOTE — Telephone Encounter (Signed)
Called Luisalberto Beegle unable to leave a messages   message via [place].

## 2022-11-27 DIAGNOSIS — I5040 Unspecified combined systolic (congestive) and diastolic (congestive) heart failure: Secondary | ICD-10-CM | POA: Diagnosis not present

## 2022-11-27 DIAGNOSIS — I10 Essential (primary) hypertension: Secondary | ICD-10-CM | POA: Diagnosis not present

## 2022-11-27 DIAGNOSIS — N179 Acute kidney failure, unspecified: Secondary | ICD-10-CM | POA: Diagnosis not present

## 2022-11-27 DIAGNOSIS — R55 Syncope and collapse: Secondary | ICD-10-CM | POA: Diagnosis not present

## 2022-11-27 DIAGNOSIS — R600 Localized edema: Secondary | ICD-10-CM | POA: Diagnosis not present

## 2022-11-27 DIAGNOSIS — N1832 Chronic kidney disease, stage 3b: Secondary | ICD-10-CM | POA: Diagnosis not present

## 2023-01-15 ENCOUNTER — Encounter: Payer: Self-pay | Admitting: Podiatry

## 2023-01-15 ENCOUNTER — Ambulatory Visit (INDEPENDENT_AMBULATORY_CARE_PROVIDER_SITE_OTHER): Payer: Medicare Other | Admitting: Podiatry

## 2023-01-15 DIAGNOSIS — M79674 Pain in right toe(s): Secondary | ICD-10-CM | POA: Diagnosis not present

## 2023-01-15 DIAGNOSIS — E1122 Type 2 diabetes mellitus with diabetic chronic kidney disease: Secondary | ICD-10-CM | POA: Diagnosis not present

## 2023-01-15 DIAGNOSIS — M79675 Pain in left toe(s): Secondary | ICD-10-CM

## 2023-01-15 DIAGNOSIS — B351 Tinea unguium: Secondary | ICD-10-CM

## 2023-01-15 NOTE — Progress Notes (Signed)
This patient returns to my office for at risk foot care.  This patient requires this care by a professional since this patient will be at risk due to having diabetes and CKD.  This patient is unable to cut nails himself since the patient cannot reach his nails.These nails are painful walking and wearing shoes.  This patient presents for at risk foot care today.  General Appearance  Alert, conversant and in no acute stress.  Vascular  Dorsalis pedis and posterior tibial  pulses are weakly  palpable  bilaterally.  Capillary return is within normal limits  bilaterally. Temperature is within normal limits  bilaterally.  Neurologic  Senn-Weinstein monofilament wire test within normal limits  bilaterally. Muscle power within normal limits bilaterally.  Nails Thick disfigured discolored nails with subungual debris  from hallux to fifth toes bilaterally. No evidence of bacterial infection or drainage bilaterally.  Orthopedic  No limitations of motion  feet .  No crepitus or effusions noted.  No bony pathology or digital deformities noted.  Skin  normotropic skin with no porokeratosis noted bilaterally.  No signs of infections or ulcers noted.     Onychomycosis  Pain in right toes  Pain in left toes  Consent was obtained for treatment procedures.   Mechanical debridement of nails 1-5  bilaterally performed with a nail nipper.  Filed with dremel without incident.    Return office visit   3 months                   Told patient to return for periodic foot care and evaluation due to potential at risk complications.   Gardiner Barefoot DPM

## 2023-02-02 DIAGNOSIS — H2513 Age-related nuclear cataract, bilateral: Secondary | ICD-10-CM | POA: Diagnosis not present

## 2023-02-02 DIAGNOSIS — H401133 Primary open-angle glaucoma, bilateral, severe stage: Secondary | ICD-10-CM | POA: Diagnosis not present

## 2023-02-09 DIAGNOSIS — I5030 Unspecified diastolic (congestive) heart failure: Secondary | ICD-10-CM | POA: Diagnosis not present

## 2023-02-09 DIAGNOSIS — I10 Essential (primary) hypertension: Secondary | ICD-10-CM | POA: Diagnosis not present

## 2023-02-09 DIAGNOSIS — I700 Atherosclerosis of aorta: Secondary | ICD-10-CM | POA: Diagnosis not present

## 2023-02-09 DIAGNOSIS — N1832 Chronic kidney disease, stage 3b: Secondary | ICD-10-CM | POA: Diagnosis not present

## 2023-02-09 DIAGNOSIS — Z0000 Encounter for general adult medical examination without abnormal findings: Secondary | ICD-10-CM | POA: Diagnosis not present

## 2023-02-09 DIAGNOSIS — E7800 Pure hypercholesterolemia, unspecified: Secondary | ICD-10-CM | POA: Diagnosis not present

## 2023-02-09 DIAGNOSIS — Z1331 Encounter for screening for depression: Secondary | ICD-10-CM | POA: Diagnosis not present

## 2023-02-09 DIAGNOSIS — H409 Unspecified glaucoma: Secondary | ICD-10-CM | POA: Diagnosis not present

## 2023-02-09 DIAGNOSIS — E1122 Type 2 diabetes mellitus with diabetic chronic kidney disease: Secondary | ICD-10-CM | POA: Diagnosis not present

## 2023-03-08 DIAGNOSIS — I130 Hypertensive heart and chronic kidney disease with heart failure and stage 1 through stage 4 chronic kidney disease, or unspecified chronic kidney disease: Secondary | ICD-10-CM | POA: Diagnosis not present

## 2023-03-08 DIAGNOSIS — E7800 Pure hypercholesterolemia, unspecified: Secondary | ICD-10-CM | POA: Diagnosis not present

## 2023-03-08 DIAGNOSIS — N1832 Chronic kidney disease, stage 3b: Secondary | ICD-10-CM | POA: Diagnosis not present

## 2023-03-08 DIAGNOSIS — E1122 Type 2 diabetes mellitus with diabetic chronic kidney disease: Secondary | ICD-10-CM | POA: Diagnosis not present

## 2023-03-08 DIAGNOSIS — I10 Essential (primary) hypertension: Secondary | ICD-10-CM | POA: Diagnosis not present

## 2023-03-08 DIAGNOSIS — I5030 Unspecified diastolic (congestive) heart failure: Secondary | ICD-10-CM | POA: Diagnosis not present

## 2023-03-22 ENCOUNTER — Other Ambulatory Visit (HOSPITAL_COMMUNITY): Payer: Self-pay

## 2023-03-23 DIAGNOSIS — H401133 Primary open-angle glaucoma, bilateral, severe stage: Secondary | ICD-10-CM | POA: Diagnosis not present

## 2023-03-23 DIAGNOSIS — H2513 Age-related nuclear cataract, bilateral: Secondary | ICD-10-CM | POA: Diagnosis not present

## 2023-03-30 DIAGNOSIS — E1122 Type 2 diabetes mellitus with diabetic chronic kidney disease: Secondary | ICD-10-CM | POA: Diagnosis not present

## 2023-03-30 DIAGNOSIS — I10 Essential (primary) hypertension: Secondary | ICD-10-CM | POA: Diagnosis not present

## 2023-03-30 DIAGNOSIS — I5030 Unspecified diastolic (congestive) heart failure: Secondary | ICD-10-CM | POA: Diagnosis not present

## 2023-03-30 DIAGNOSIS — I130 Hypertensive heart and chronic kidney disease with heart failure and stage 1 through stage 4 chronic kidney disease, or unspecified chronic kidney disease: Secondary | ICD-10-CM | POA: Diagnosis not present

## 2023-03-30 DIAGNOSIS — N1832 Chronic kidney disease, stage 3b: Secondary | ICD-10-CM | POA: Diagnosis not present

## 2023-03-30 DIAGNOSIS — E7800 Pure hypercholesterolemia, unspecified: Secondary | ICD-10-CM | POA: Diagnosis not present

## 2023-04-16 ENCOUNTER — Encounter: Payer: Self-pay | Admitting: Podiatry

## 2023-04-16 ENCOUNTER — Ambulatory Visit (INDEPENDENT_AMBULATORY_CARE_PROVIDER_SITE_OTHER): Payer: Medicare Other | Admitting: Podiatry

## 2023-04-16 DIAGNOSIS — M79675 Pain in left toe(s): Secondary | ICD-10-CM | POA: Diagnosis not present

## 2023-04-16 DIAGNOSIS — E1122 Type 2 diabetes mellitus with diabetic chronic kidney disease: Secondary | ICD-10-CM | POA: Diagnosis not present

## 2023-04-16 DIAGNOSIS — B351 Tinea unguium: Secondary | ICD-10-CM | POA: Diagnosis not present

## 2023-04-16 DIAGNOSIS — M79674 Pain in right toe(s): Secondary | ICD-10-CM | POA: Diagnosis not present

## 2023-04-16 NOTE — Progress Notes (Signed)
This patient returns to my office for at risk foot care.  This patient requires this care by a professional since this patient will be at risk due to having diabetes. and CKD.   This patient is unable to cut nails himself since the patient cannot reach his nails.These nails are painful walking and wearing shoes.  This patient presents for at risk foot care today.  General Appearance  Alert, conversant and in no acute stress.  Vascular  Dorsalis pedis and posterior tibial  pulses are weaklypalpable  bilaterally.  Capillary return is within normal limits  bilaterally. Temperature is within normal limits  bilaterally.  Neurologic  Senn-Weinstein monofilament wire test within normal limits  bilaterally. Muscle power within normal limits bilaterally.  Nails Thick disfigured discolored nails with subungual debris  from hallux to fifth toes bilaterally. No evidence of bacterial infection or drainage bilaterally.  Orthopedic  No limitations of motion  feet .  No crepitus or effusions noted.  No bony pathology or digital deformities noted.  Skin  normotropic skin with no porokeratosis noted bilaterally.  No signs of infections or ulcers noted.     Onychomycosis  Pain in right toes  Pain in left toes  Consent was obtained for treatment procedures.   Mechanical debridement of nails 1-5  bilaterally performed with a nail nipper.  Filed with dremel without incident.    Return office visit    3 months                 Told patient to return for periodic foot care and evaluation due to potential at risk complications.   Isis Costanza DPM  

## 2023-04-23 DIAGNOSIS — H401133 Primary open-angle glaucoma, bilateral, severe stage: Secondary | ICD-10-CM | POA: Diagnosis not present

## 2023-06-17 DIAGNOSIS — I509 Heart failure, unspecified: Secondary | ICD-10-CM | POA: Diagnosis not present

## 2023-06-17 DIAGNOSIS — I10 Essential (primary) hypertension: Secondary | ICD-10-CM | POA: Diagnosis not present

## 2023-06-17 DIAGNOSIS — E1122 Type 2 diabetes mellitus with diabetic chronic kidney disease: Secondary | ICD-10-CM | POA: Diagnosis not present

## 2023-06-17 DIAGNOSIS — R413 Other amnesia: Secondary | ICD-10-CM | POA: Diagnosis not present

## 2023-06-17 DIAGNOSIS — R443 Hallucinations, unspecified: Secondary | ICD-10-CM | POA: Diagnosis not present

## 2023-06-18 DIAGNOSIS — H401133 Primary open-angle glaucoma, bilateral, severe stage: Secondary | ICD-10-CM | POA: Diagnosis not present

## 2023-06-18 DIAGNOSIS — H2513 Age-related nuclear cataract, bilateral: Secondary | ICD-10-CM | POA: Diagnosis not present

## 2023-07-08 DIAGNOSIS — I5022 Chronic systolic (congestive) heart failure: Secondary | ICD-10-CM | POA: Insufficient documentation

## 2023-07-08 DIAGNOSIS — I517 Cardiomegaly: Secondary | ICD-10-CM | POA: Insufficient documentation

## 2023-07-08 NOTE — Progress Notes (Signed)
Cardiology Office Note:    Date:  07/09/2023  ID:  Steven Myers, DOB [birth year], MRN [id number] PCP: Renford Dills, MD  [facility] Providers Cardiologist:  Bryan Lemma, MD       Myers Profile:      HFmrEF (heart failure with mildly reduced ejection fraction)  TTE 06/12/2022: Inferior HK, EF 45-50, severe LVH, G1 DD, mildly reduced RVSF, trivial MR, AV sclerosis, mildly dilated ascending aorta (39 mm), RAP 3 Admx in 05/2022 w COVID, pos but flat hsT Myoview 07/14/22: no ischemia/infarction, EF 49; low risk  TTE 09/11/22: Inferolateral HK, severe LVH with speckled pattern (consider amyloid), apical thrombus versus prominent trabeculae (consider CMR), EF 45-50, GR 2 DD, GLS -17.8, severely reduced RVSF, mild BAE, mild MR, borderline dilation of ascending aorta (38 mm), RAP 3 Severe LVH Hypertension Diabetes mellitus Hyperlipidemia Intol of statins 2/2 ? LFTs Chronic kidney disease Right bundle branch block Obesity Lymphedema           Discussed the use of AI scribe software for clinical note transcription with the Myers, who gave verbal consent to proceed.  History of Present Illness   Steven Myers with a history of CHF presents for a follow-up visit. The Myers was hospitalized in July with COVID-19, during which troponins were elevated but flat, consistent with demand ischemia. Steven echocardiogram at the time showed inferior hypokinesis with Steven ejection fraction (EF) of 45-50%. Outpatient myoview was negative for ischemia. A follow-up echo in October 2023 showed the same EF, severe left ventricular hypertrophy (LVH), severely reduced right ventricular systolic function (RBSF), and a questionable thrombus versus prominent trabeculae in the left ventricle. The Myers was admitted again in December 2023 with syncope, likely vasovagal in the setting of hypotension. Blood pressure medications were adjusted, and the Myers was given IV fluids with improved  symptoms.  The Myers is here today with his son. He reports progressive shortness of breath, which is a new symptom as he has always been active. He denies any chest discomfort. There have been no further episodes of syncope since December. The Myers is able to lay flat to sleep. He has noticed progressive swelling in his legs. He takes furosemide 20 mg daily twice daily.      ROS:  See the HPI No melena, hematochezia, hematuria, fever. + A.m. cough    Studies Reviewed:        Risk Assessment/Calculations:               Physical Exam:   VS:  BP 100/60   Pulse 68   Ht 5\' 10"  (1.778 m)   Wt 202 lb (91.6 kg)   SpO2 94%   BMI 28.98 kg/m    Wt Readings from Last 3 Encounters:  07/09/23 202 lb (91.6 kg)  11/15/22 209 lb 14.1 oz (95.2 kg)  09/25/22 207 lb 12.8 oz (94.3 kg)    Constitutional:      Appearance: Not in distress.  Neck:     Vascular: No JVR.  Pulmonary:     Breath sounds: No wheezing. Bibasilar Rales (faint crackles bilat) present.  Cardiovascular:     Normal rate. Regular rhythm.     Murmurs: There is no murmur.     S4 Gallop.   Edema:    Pretibial: bilateral 2+ edema of the pretibial area.    Ankle: bilateral 2+ edema of the ankle. Abdominal:     Palpations: Abdomen is soft.  Skin:    General: Skin is warm and dry.  Assessment and Plan:  Heart failure with mildly reduced ejection fraction (HFmrEF) (HCC) Progressive shortness of breath and progressive lower extremity edema over the past year. EF 45-50% with severe LVH and severely reduced RVSF. NYHA IIb-III. He is volume overloaded on exam with significant leg edema and a suggestion of rales at the bases bilaterally. We discussed the 4 pillars of CHF management. He has baseline chronic kidney disease with eGFR 38 (stage 3b). He is currently on ACE inhibitor.  -Change furosemide to torsemide 20mg  daily for better diuresis. -Reduce amlodipine from 5mg  to 2.5mg  daily due to low blood  pressure. -Continue metoprolol succinate 100 mg daily, trandolapril 4 mg twice a day -BMET today; repeat BMET 1 week -GDMT: At follow-up consider changing ACE inhibitor to Entresto (if blood pressure) or SGLT2 inhibitor. -Follow-up 3 to 4 weeks  LVH (left ventricular hypertrophy) Echocardiogram in October 2023 with severe LVH and speckled pattern as well as possible prominent trabeculae and severely reduced RV systolic function.  I reviewed his case with Dr. Herbie Baltimore. -Order SPEP/UPEP -Order PYP scan -Consider cardiac MRI if PYP scan is inconclusive.  Hyperlipidemia associated with type 2 diabetes mellitus (HCC) Intolerant to statins, currently on Zetia 10mg  daily. LDL was fair at 104 in March 2024. -Continue Zetia 10mg  daily.  Essential hypertension Decrease Amlodipine to 2.5 mg once daily. Consider stopping to allow room in BP for HF GDMT.     Informed Consent   Shared Decision Making/Informed Consent The risks (exposure to a small amount of radiation, allergic or hypersensitivity reaction), benefits (assessment for cardiac amyloidosis) and alternatives of cardiac scintigraphy were discussed in detail with Steven Myers and he agrees to proceed.     Dispo:  Return in about 4 weeks (around 08/06/2023) for Routine Follow Up with Dr. Herbie Baltimore, or [name], PA-C, or Edd Fabian, NP.  Signed, [name], PA-C

## 2023-07-09 ENCOUNTER — Ambulatory Visit: Payer: Medicare Other | Attending: Physician Assistant | Admitting: Physician Assistant

## 2023-07-09 ENCOUNTER — Encounter: Payer: Self-pay | Admitting: Physician Assistant

## 2023-07-09 VITALS — BP 100/60 | HR 68 | Ht 70.0 in | Wt 202.0 lb

## 2023-07-09 DIAGNOSIS — I517 Cardiomegaly: Secondary | ICD-10-CM | POA: Diagnosis not present

## 2023-07-09 DIAGNOSIS — R9431 Abnormal electrocardiogram [ECG] [EKG]: Secondary | ICD-10-CM | POA: Diagnosis not present

## 2023-07-09 DIAGNOSIS — E785 Hyperlipidemia, unspecified: Secondary | ICD-10-CM

## 2023-07-09 DIAGNOSIS — E1169 Type 2 diabetes mellitus with other specified complication: Secondary | ICD-10-CM

## 2023-07-09 DIAGNOSIS — I10 Essential (primary) hypertension: Secondary | ICD-10-CM

## 2023-07-09 DIAGNOSIS — I5022 Chronic systolic (congestive) heart failure: Secondary | ICD-10-CM

## 2023-07-09 MED ORDER — TORSEMIDE 20 MG PO TABS: 20.0000 mg | ORAL_TABLET | Freq: Every day | ORAL | 3 refills | Status: DC

## 2023-07-09 MED ORDER — AMLODIPINE BESYLATE 2.5 MG PO TABS: 2.5000 mg | ORAL_TABLET | Freq: Every day | ORAL | 3 refills | Status: DC

## 2023-07-09 NOTE — Assessment & Plan Note (Signed)
Intolerant to statins, currently on Zetia 10mg  daily. LDL was fair at 104 in March 2024. -Continue Zetia 10mg  daily.

## 2023-07-09 NOTE — Assessment & Plan Note (Signed)
Decrease Amlodipine to 2.5 mg once daily. Consider stopping to allow room in BP for HF GDMT.

## 2023-07-09 NOTE — Assessment & Plan Note (Signed)
Progressive shortness of breath and progressive lower extremity edema over the past year. EF 45-50% with severe LVH and severely reduced RVSF. NYHA IIb-III. He is volume overloaded on exam with significant leg edema and a suggestion of rales at the bases bilaterally. We discussed the 4 pillars of CHF management. He has baseline chronic kidney disease with eGFR 38 (stage 3b). He is currently on ACE inhibitor.  -Change furosemide to torsemide 20mg  daily for better diuresis. -Reduce amlodipine from 5mg  to 2.5mg  daily due to low blood pressure. -Continue metoprolol succinate 100 mg daily, trandolapril 4 mg twice a day -BMET today; repeat BMET 1 week -GDMT: At follow-up consider changing ACE inhibitor to Entresto (if blood pressure) or SGLT2 inhibitor. -Follow-up 3 to 4 weeks

## 2023-07-09 NOTE — Assessment & Plan Note (Signed)
Echocardiogram in October 2023 with severe LVH and speckled pattern as well as possible prominent trabeculae and severely reduced RV systolic function.  I reviewed his case with Dr. Herbie Baltimore. -Order SPEP/UPEP -Order PYP scan -Consider cardiac MRI if PYP scan is inconclusive.

## 2023-07-09 NOTE — Patient Instructions (Addendum)
Medication Instructions:  Your physician has recommended you make the following change in your medication:   1) STOP furosemide (Lasix) 2) DECREASE amlodipine to 2.5mg  daily 3) START torsemide (Demadex) 20mg  daily  *If you need a refill on your cardiac medications before your next appointment, please call your pharmacy*  Lab Work: TODAY: Marykay Lex Wednesday July 14, 2023: BMET (come to the lab any time between 7:30am and 4:00pm) If you have labs (blood work) drawn today and your tests are completely normal, you will receive your results only by: MyChart Message (if you have MyChart) OR A paper copy in the mail If you have any lab test that is abnormal or we need to change your treatment, we will call you to review the results.  Testing/Procedures: Your provider has requested you have a PYP amyloid scan. You will be contacted to schedule this test.  Follow-Up: At [facility], you and your health needs are our priority.  As part of our continuing mission to provide you with exceptional heart care, we have created designated Provider Care Teams.  These Care Teams include your primary Cardiologist (physician) and Advanced Practice Providers (APPs -  Physician Assistants and Nurse Practitioners) who all work together to provide you with the care you need, when you need it.  Your next appointment:   07/30/2023 at 10:55 AM  The format for your next appointment:   In Person  Provider:   [name], PA-C

## 2023-07-12 ENCOUNTER — Ambulatory Visit (HOSPITAL_COMMUNITY)
Admission: RE | Admit: 2023-07-12 | Discharge: 2023-07-12 | Disposition: A | Discharge status: Home / Self Care | Payer: Medicare Other | Source: Ambulatory Visit | Attending: Internal Medicine | Admitting: Internal Medicine

## 2023-07-12 ENCOUNTER — Other Ambulatory Visit (HOSPITAL_COMMUNITY): Payer: Self-pay | Admitting: Internal Medicine

## 2023-07-12 DIAGNOSIS — T148XXA Other injury of unspecified body region, initial encounter: Secondary | ICD-10-CM | POA: Diagnosis not present

## 2023-07-12 DIAGNOSIS — M79605 Pain in left leg: Secondary | ICD-10-CM

## 2023-07-12 DIAGNOSIS — M79606 Pain in leg, unspecified: Secondary | ICD-10-CM | POA: Diagnosis not present

## 2023-07-12 NOTE — Progress Notes (Signed)
Left lower extremity venous study completed.   Attempted to call preliminary results to Dr. Idelle Crouch office, but no answer.  Please see CV Procedures for preliminary results.  Eugina Row, RVT  4:19 PM 07/12/23

## 2023-07-13 ENCOUNTER — Encounter (HOSPITAL_COMMUNITY): Payer: Self-pay | Admitting: Internal Medicine

## 2023-07-13 ENCOUNTER — Telehealth: Payer: Self-pay | Admitting: Cardiology

## 2023-07-13 LAB — MULTIPLE MYELOMA PANEL, SERUM
Albumin SerPl Elph-Mcnc: 3.2 g/dL (ref 2.9–4.4)
Albumin/Glob SerPl: 1.3 (ref 0.7–1.7)
Alpha 1: 0.2 g/dL (ref 0.0–0.4)
Alpha2 Glob SerPl Elph-Mcnc: 0.6 g/dL (ref 0.4–1.0)
B-Globulin SerPl Elph-Mcnc: 0.8 g/dL (ref 0.7–1.3)
Gamma Glob SerPl Elph-Mcnc: 1 g/dL (ref 0.4–1.8)
Globulin, Total: 2.6 g/dL (ref 2.2–3.9)
IgA/Immunoglobulin A, Serum: 146 mg/dL (ref 61–437)
IgG (Immunoglobin G), Serum: 1054 mg/dL (ref 603–1613)
IgM (Immunoglobulin M), Srm: 31 mg/dL (ref 15–143)
Total Protein: 5.8 g/dL — ABNORMAL LOW (ref 6.0–8.5)

## 2023-07-13 LAB — BASIC METABOLIC PANEL
BUN/Creatinine Ratio: 11 (ref 10–24)
BUN: 22 mg/dL (ref 8–27)
CO2: 27 mmol/L (ref 20–29)
Calcium: 8.6 mg/dL (ref 8.6–10.2)
Chloride: 106 mmol/L (ref 96–106)
Creatinine, Ser: 1.95 mg/dL — ABNORMAL HIGH (ref 0.76–1.27)
Glucose: 100 mg/dL — ABNORMAL HIGH (ref 70–99)
Potassium: 4.3 mmol/L (ref 3.5–5.2)
Sodium: 144 mmol/L (ref 134–144)
eGFR: 33 mL/min/{1.73_m2} — ABNORMAL LOW (ref >59)

## 2023-07-13 NOTE — Telephone Encounter (Signed)
Daughter returned staff call to report the patient was not able to give a urine sample at his last visit.

## 2023-07-13 NOTE — Telephone Encounter (Signed)
Daughter returned the call.  Per her brother, pt was unable to produce any urine at the last office visit.  She will have him try again when he comes in for his stress test 07/22/23.

## 2023-07-14 ENCOUNTER — Ambulatory Visit: Payer: Medicare Other

## 2023-07-16 ENCOUNTER — Ambulatory Visit (INDEPENDENT_AMBULATORY_CARE_PROVIDER_SITE_OTHER): Payer: Medicare Other | Admitting: Podiatry

## 2023-07-16 ENCOUNTER — Encounter: Payer: Self-pay | Admitting: Podiatry

## 2023-07-16 DIAGNOSIS — B351 Tinea unguium: Secondary | ICD-10-CM | POA: Diagnosis not present

## 2023-07-16 DIAGNOSIS — M79674 Pain in right toe(s): Secondary | ICD-10-CM | POA: Diagnosis not present

## 2023-07-16 DIAGNOSIS — E1122 Type 2 diabetes mellitus with diabetic chronic kidney disease: Secondary | ICD-10-CM

## 2023-07-16 DIAGNOSIS — M79675 Pain in left toe(s): Secondary | ICD-10-CM | POA: Diagnosis not present

## 2023-07-16 NOTE — Progress Notes (Signed)
This patient returns to my office for at risk foot care.  This patient requires this care by a professional since this patient will be at risk due to having diabetes. and CKD.   This patient is unable to cut nails himself since the patient cannot reach his nails.These nails are painful walking and wearing shoes.  This patient presents for at risk foot care today.  General Appearance  Alert, conversant and in no acute stress.  Vascular  Dorsalis pedis and posterior tibial  pulses are weaklypalpable  bilaterally.  Capillary return is within normal limits  bilaterally. Temperature is within normal limits  bilaterally.  Neurologic  Senn-Weinstein monofilament wire test within normal limits  bilaterally. Muscle power within normal limits bilaterally.  Nails Thick disfigured discolored nails with subungual debris  from hallux to fifth toes bilaterally. No evidence of bacterial infection or drainage bilaterally.  Orthopedic  No limitations of motion  feet .  No crepitus or effusions noted.  No bony pathology or digital deformities noted.  Skin  normotropic skin with no porokeratosis noted bilaterally.  No signs of infections or ulcers noted.     Onychomycosis  Pain in right toes  Pain in left toes  Consent was obtained for treatment procedures.   Mechanical debridement of nails 1-5  bilaterally performed with a nail nipper.  Filed with dremel without incident.    Return office visit    3 months                 Told patient to return for periodic foot care and evaluation due to potential at risk complications.   Gregory Mayer DPM  

## 2023-07-20 ENCOUNTER — Telehealth (HOSPITAL_COMMUNITY): Payer: Self-pay | Admitting: *Deleted

## 2023-07-20 NOTE — Telephone Encounter (Signed)
Left message on home machine regarding amyloid imaging instructions.

## 2023-07-22 ENCOUNTER — Ambulatory Visit: Payer: Medicare Other

## 2023-07-22 ENCOUNTER — Telehealth: Payer: Self-pay | Admitting: Cardiology

## 2023-07-22 ENCOUNTER — Ambulatory Visit (HOSPITAL_COMMUNITY): Payer: Medicare Other | Attending: Physician Assistant

## 2023-07-22 DIAGNOSIS — I5022 Chronic systolic (congestive) heart failure: Secondary | ICD-10-CM | POA: Insufficient documentation

## 2023-07-22 DIAGNOSIS — I517 Cardiomegaly: Secondary | ICD-10-CM | POA: Diagnosis not present

## 2023-07-22 DIAGNOSIS — R9431 Abnormal electrocardiogram [ECG] [EKG]: Secondary | ICD-10-CM

## 2023-07-22 MED ORDER — TECHNETIUM TC 99M PYROPHOSPHATE
21.9000 | Freq: Once | INTRAVENOUS | Status: AC
  Administered 2023-07-22: 21.9 via INTRAVENOUS

## 2023-07-22 NOTE — Telephone Encounter (Signed)
Daughter calling in about patient needing to do an urine test. But there is not order. Please advise

## 2023-07-22 NOTE — Telephone Encounter (Signed)
Will forward this to Dr. Herbie Baltimore for advice.

## 2023-07-23 ENCOUNTER — Other Ambulatory Visit: Payer: Medicare Other

## 2023-07-23 LAB — BASIC METABOLIC PANEL
BUN/Creatinine Ratio: 14 (ref 10–24)
BUN: 25 mg/dL (ref 8–27)
CO2: 26 mmol/L (ref 20–29)
Calcium: 8.6 mg/dL (ref 8.6–10.2)
Chloride: 105 mmol/L (ref 96–106)
Creatinine, Ser: 1.79 mg/dL — ABNORMAL HIGH (ref 0.76–1.27)
Glucose: 108 mg/dL — ABNORMAL HIGH (ref 70–99)
Potassium: 4.6 mmol/L (ref 3.5–5.2)
Sodium: 143 mmol/L (ref 134–144)
eGFR: 36 mL/min/{1.73_m2} — ABNORMAL LOW (ref >59)

## 2023-07-23 LAB — MYOCARDIAL AMYLOID PLANAR & SPECT: H/CL Ratio: 1.85

## 2023-07-27 NOTE — Telephone Encounter (Signed)
Called to daughter and advised test still pending but will notify once results are in.

## 2023-07-27 NOTE — Telephone Encounter (Signed)
I think this is UPEP == > ordered by Mr. Alben Spittle  [facility]

## 2023-07-27 NOTE — Telephone Encounter (Signed)
Patient's daughter is returning call and is requesting a return call.

## 2023-07-27 NOTE — Telephone Encounter (Signed)
 Returned call to pt. LVM to return call

## 2023-07-27 NOTE — Telephone Encounter (Signed)
It looks like the UPEP is in progress. The results are currently pending. [name], PA-C    07/27/2023 8:36 AM

## 2023-07-28 LAB — UPEP/UIFE/LIGHT CHAINS/TP, 24-HR UR
Free Kappa Lt Chains,Ur: 69.73 mg/L (ref 1.17–86.46)
Free Lambda Lt Chains,Ur: 35.71 mg/L — ABNORMAL HIGH (ref 0.27–15.21)
Kappa/Lambda Ratio,U: 1.95 (ref 1.83–14.26)

## 2023-07-29 ENCOUNTER — Encounter: Payer: Self-pay | Admitting: Physician Assistant

## 2023-07-29 DIAGNOSIS — E854 Organ-limited amyloidosis: Secondary | ICD-10-CM

## 2023-07-29 HISTORY — DX: Cardiomyopathy in diseases classified elsewhere: E85.4

## 2023-07-29 NOTE — Progress Notes (Signed)
Cardiology Office Note:    Date:  07/30/2023  ID:  Steven Myers, DOB 06-Aug-1935, MRN [id number] PCP: Renford Dills, MD  [facility] Providers Cardiologist:  Bryan Lemma, MD       Patient Profile:      HFmrEF (heart failure with mildly reduced ejection fraction)  TTE 06/12/2022: Inferior HK, EF 45-50, severe LVH, G1 DD, mildly reduced RVSF, trivial MR, AV sclerosis, mildly dilated ascending aorta (39 mm), RAP 3 Admx in 05/2022 w COVID, pos but flat hsT Myoview 07/14/22: no ischemia/infarction, EF 49; low risk  TTE 09/11/22: Inferolateral HK, severe LVH with speckled pattern (consider amyloid), apical thrombus versus prominent trabeculae (consider CMR), EF 45-50, GR 2 DD, GLS -17.8, severely reduced RVSF, mild BAE, mild MR, borderline dilation of ascending aorta (38 mm), RAP 3 Severe LVH SPEP 06/2023 neg for monoclonal gammopathy 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 LFTs Chronic kidney disease Right bundle branch block Obesity Lymphedema         History of Present Illness:  Steven Myers is a 88 y.o. male who returns for follow up of CHF. He was last seen 07/09/23. He was volume overloaded and I changed Furosemide to Torsemide. He has a hx of severe LVH on TTE with speckled pattern. I set him up for SPEP/UPEP and PYP Scan. His SPEP did not show any monoclonal gammopathy. His UPEP is pending. His PYP scan is suggestive of TTR Amyloid. Referral to CHF Clinic will be made after our visit today. His PCP set up a venous doppler 07/12/23 that was neg for DVT on the L. He is here today with his daughter.  Since last seen, he has not noticed any improvement.  His lower extreme edema is still significant.  He notes shortness of breath with minimal activities.  He has not had orthopnea, syncope, chest pain.  We discussed the findings on his testing today.  He still needs UPEP completed.   ROS: See HPI    Studies  Reviewed:        Risk Assessment/Calculations:             Physical Exam:   VS:  BP 128/76   Pulse 84   Ht 5\' 9"  (1.753 m)   Wt 199 lb 6.4 oz (90.4 kg)   SpO2 93%   BMI 29.45 kg/m    Wt Readings from Last 3 Encounters:  07/30/23 199 lb 6.4 oz (90.4 kg)  07/22/23 202 lb (91.6 kg)  07/09/23 202 lb (91.6 kg)    Constitutional:      Appearance: Not in distress.  Neck:     Vascular: No JVR.  Pulmonary:     Breath sounds: No wheezing. Bibasilar Rales (faint crackles bilat) present.  Cardiovascular:     Normal rate. Regular rhythm.     Murmurs: There is no murmur.     S4 Gallop.   Edema:    Pretibial: bilateral 3+ edema of the pretibial area.    Ankle: bilateral 3+ edema of the ankle.    Comments: tight Abdominal:     Palpations: Abdomen is soft.  Skin:    General: Skin is warm and dry.      Assessment and Plan:   HFmrEF (heart failure with mildly reduced ejection fraction)  EF by echocardiogram in October 2023 was 45-50 with moderate diastolic dysfunction and severe LVH.  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  He is NYHA III.  He  remains volume overloaded.  We discussed the 4 pillars of CHF management. -Continue trandolapril 4 mg daily, Toprol-XL 100 mg daily -Increase torsemide to 40 mg daily -Repeat BMET 1 week -If GFR follow-up labs in 1 week remains >30, and Jardiance 10 mg daily (we discussed potential for GU side effects including infection) -Refer to CHF clinic -Follow-up here in 2 months.  If he is followed chronically in the heart failure clinic, we can push this appointment out further.  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  SPEP negative for monoclonal gammopathy.  UPEP pending.  We discussed cardiac amyloidosis and the role for tafamidis and management. -Refer to advanced CHF clinic  Hypertension Controlled. -Continue amlodipine 2.5 mg daily, Toprol-XL 100 mg daily, trandolapril 4 mg daily  Chronic kidney disease Stage III.   Recent GFR 36.  Obtain follow-up BMET in 1 week after increasing torsemide.       Dispo:  Return in about 2 months (around 09/29/2023) for Routine Follow Up w/ Dr. Herbie Baltimore, or [name], PA-C.   Signed, [name], PA-C

## 2023-07-30 ENCOUNTER — Ambulatory Visit: Payer: Medicare Other | Attending: Physician Assistant | Admitting: Physician Assistant

## 2023-07-30 ENCOUNTER — Other Ambulatory Visit: Payer: Self-pay | Admitting: *Deleted

## 2023-07-30 ENCOUNTER — Encounter: Payer: Self-pay | Admitting: Physician Assistant

## 2023-07-30 VITALS — BP 128/76 | HR 84 | Ht 69.0 in | Wt 199.4 lb

## 2023-07-30 DIAGNOSIS — I43 Cardiomyopathy in diseases classified elsewhere: Secondary | ICD-10-CM | POA: Diagnosis not present

## 2023-07-30 DIAGNOSIS — N1832 Chronic kidney disease, stage 3b: Secondary | ICD-10-CM | POA: Insufficient documentation

## 2023-07-30 DIAGNOSIS — N183 Chronic kidney disease, stage 3 unspecified: Secondary | ICD-10-CM | POA: Insufficient documentation

## 2023-07-30 DIAGNOSIS — I10 Essential (primary) hypertension: Secondary | ICD-10-CM | POA: Diagnosis not present

## 2023-07-30 DIAGNOSIS — I5022 Chronic systolic (congestive) heart failure: Secondary | ICD-10-CM | POA: Diagnosis not present

## 2023-07-30 DIAGNOSIS — E854 Organ-limited amyloidosis: Secondary | ICD-10-CM | POA: Insufficient documentation

## 2023-07-30 MED ORDER — TORSEMIDE 20 MG PO TABS: 40.0000 mg | ORAL_TABLET | Freq: Every day | ORAL | 3 refills | Status: DC

## 2023-07-30 NOTE — Patient Instructions (Addendum)
Medication Instructions:  Your physician has recommended you make the following change in your medication:   INCREASE the Torsemide to 20 taking 2 tablets daily  *If you need a refill on your cardiac medications before your next appointment, please call your pharmacy*   Lab Work: GO TO THE [facility] ON THE 1ST FLOOR TODAY AND GIVE THE LAB ORDER FOR:  UPEP  08/06/23:  COME BACK TO THE OFFICE FOR A REPEAT:  BMET, YOU CAN COME ANYTIME BETWEEN 7:15 AND 4:45 PM.  If you have labs (blood work) drawn today and your tests are completely normal, you will receive your results only by: MyChart Message (if you have MyChart) OR A paper copy in the mail If you have any lab test that is abnormal or we need to change your treatment, we will call you to review the results.   Testing/Procedures: None ordered   You have been referred to [facility].  They will call to arrange an appointment.  Follow-Up: At [facility], you and your health needs are our priority.  As part of our continuing mission to provide you with exceptional heart care, we have created designated Provider Care Teams.  These Care Teams include your primary Cardiologist (physician) and Advanced Practice Providers (APPs -  Physician Assistants and Nurse Practitioners) who all work together to provide you with the care you need, when you need it.  We recommend signing up for the patient portal called "MyChart".  Sign up information is provided on this After Visit Summary.  MyChart is used to connect with patients for Virtual Visits (Telemedicine).  Patients are able to view lab/test results, encounter notes, upcoming appointments, etc.  Non-urgent messages can be sent to your provider as well.   To learn more about what you can do with MyChart, go to [email or web address].    Your next appointment:   2 month(s)  Provider:   Bryan Lemma, MD  or [name], PA_c    Other Instructions

## 2023-08-02 DIAGNOSIS — E854 Organ-limited amyloidosis: Secondary | ICD-10-CM | POA: Diagnosis not present

## 2023-08-02 DIAGNOSIS — I43 Cardiomyopathy in diseases classified elsewhere: Secondary | ICD-10-CM | POA: Diagnosis not present

## 2023-08-03 LAB — UPEP/TP, 24-HR URINE
Albumin, U: 43.1 %
Alpha 1, Urine: 8.3 %
Alpha 2, Urine: 13 %
Beta, Urine: 25.3 %
Gamma Globulin, Urine: 10.3 %
Protein, 24H Urine: 117 mg/(24.h) (ref 30–150)
Protein, Ur: 9 mg/dL

## 2023-08-06 ENCOUNTER — Ambulatory Visit: Payer: Medicare Other | Attending: Internal Medicine

## 2023-08-06 DIAGNOSIS — I5022 Chronic systolic (congestive) heart failure: Secondary | ICD-10-CM

## 2023-08-07 LAB — BASIC METABOLIC PANEL
BUN/Creatinine Ratio: 20 (ref 10–24)
BUN: 38 mg/dL — ABNORMAL HIGH (ref 8–27)
CO2: 23 mmol/L (ref 20–29)
Calcium: 9.2 mg/dL (ref 8.6–10.2)
Chloride: 104 mmol/L (ref 96–106)
Creatinine, Ser: 1.88 mg/dL — ABNORMAL HIGH (ref 0.76–1.27)
Glucose: 120 mg/dL — ABNORMAL HIGH (ref 70–99)
Potassium: 5.3 mmol/L — ABNORMAL HIGH (ref 3.5–5.2)
Sodium: 149 mmol/L — ABNORMAL HIGH (ref 134–144)
eGFR: 34 mL/min/{1.73_m2} — ABNORMAL LOW (ref >59)

## 2023-08-09 ENCOUNTER — Telehealth: Payer: Self-pay | Admitting: *Deleted

## 2023-08-09 DIAGNOSIS — Z79899 Other long term (current) drug therapy: Secondary | ICD-10-CM

## 2023-08-09 NOTE — Telephone Encounter (Signed)
-----   Message from [name] sent at 08/07/2023  4:31 PM EDT ----- Results sent to [facility] via MyChart. See MyChart comments below. PLAN:  -BMET Monday 9/16 or Tues 9/17  Steven Myers  Your kidney function (creatinine) is stable. Your potassium and sodium are high which appears to likely be due to hemolysis of red blood cells in the test tube (falsely elevated). I think your kidney function is stable enough to try one of those newer medications we discussed Steven Myers or Steven Myers). But I would like to repeat your lab test (to check your potassium) early next week before we make any changes. The results of your urine test look to be ok. The electrophoresis portion has not returned yet. I will send you a message as soon as it is resulted. [name], PA-C

## 2023-08-10 ENCOUNTER — Ambulatory Visit: Payer: Medicare Other | Attending: Cardiovascular Disease

## 2023-08-10 DIAGNOSIS — Z79899 Other long term (current) drug therapy: Secondary | ICD-10-CM

## 2023-08-10 DIAGNOSIS — R443 Hallucinations, unspecified: Secondary | ICD-10-CM | POA: Diagnosis not present

## 2023-08-10 DIAGNOSIS — I429 Cardiomyopathy, unspecified: Secondary | ICD-10-CM | POA: Diagnosis not present

## 2023-08-10 DIAGNOSIS — R413 Other amnesia: Secondary | ICD-10-CM | POA: Diagnosis not present

## 2023-08-10 DIAGNOSIS — Z23 Encounter for immunization: Secondary | ICD-10-CM | POA: Diagnosis not present

## 2023-08-11 ENCOUNTER — Telehealth: Payer: Self-pay | Admitting: *Deleted

## 2023-08-11 DIAGNOSIS — Z79899 Other long term (current) drug therapy: Secondary | ICD-10-CM

## 2023-08-11 MED ORDER — TORSEMIDE 20 MG PO TABS: 20.0000 mg | ORAL_TABLET | Freq: Every day | ORAL | Status: DC

## 2023-08-11 MED ORDER — EMPAGLIFLOZIN 10 MG PO TABS: 10.0000 mg | ORAL_TABLET | Freq: Every day | ORAL | 3 refills | Status: DC

## 2023-08-11 NOTE — Addendum Note (Signed)
Addended by: Burnetta Sabin on: 08/11/2023 02:26 PM   Modules accepted: Orders

## 2023-08-11 NOTE — Telephone Encounter (Signed)
-----   Message from [name] sent at 08/11/2023  1:19 PM EDT ----- Results sent to [facility] via [place]. See MyChart comments below. PLAN:  -Start Jardiance 10 mg once daily (give samples + Rx) -Decrease Torsemide to 20 mg once daily  -BMET 1 week -Can we check on status of referral to CHF clinic?  Steven Myers  Your potassium is normal.  Your kidney function (creatinine) remains stable.  I will start you on Jardiance 10 mg daily to help with management of excess fluid.  With the addition of Jardiance, I would like to decrease your torsemide to 20 mg once daily. I will recheck labs again in 1 week. [name], PA-C

## 2023-08-11 NOTE — Addendum Note (Signed)
Addended by: Burnetta Sabin on: 08/11/2023 02:25 PM   Modules accepted: Orders

## 2023-08-19 ENCOUNTER — Other Ambulatory Visit: Payer: Self-pay | Admitting: Physician Assistant

## 2023-08-19 ENCOUNTER — Ambulatory Visit: Payer: Medicare Other | Attending: Physician Assistant

## 2023-08-19 DIAGNOSIS — Z79899 Other long term (current) drug therapy: Secondary | ICD-10-CM

## 2023-08-20 LAB — BASIC METABOLIC PANEL
BUN/Creatinine Ratio: 12 (ref 10–24)
BUN: 22 mg/dL (ref 8–27)
CO2: 27 mmol/L (ref 20–29)
Calcium: 9.1 mg/dL (ref 8.6–10.2)
Chloride: 102 mmol/L (ref 96–106)
Creatinine, Ser: 1.89 mg/dL — ABNORMAL HIGH (ref 0.76–1.27)
Glucose: 113 mg/dL — ABNORMAL HIGH (ref 70–99)
Potassium: 4.4 mmol/L (ref 3.5–5.2)
Sodium: 141 mmol/L (ref 134–144)
eGFR: 34 mL/min/{1.73_m2} — ABNORMAL LOW (ref >59)

## 2023-08-31 DIAGNOSIS — H2513 Age-related nuclear cataract, bilateral: Secondary | ICD-10-CM | POA: Diagnosis not present

## 2023-08-31 DIAGNOSIS — H401113 Primary open-angle glaucoma, right eye, severe stage: Secondary | ICD-10-CM | POA: Diagnosis not present

## 2023-08-31 DIAGNOSIS — H04123 Dry eye syndrome of bilateral lacrimal glands: Secondary | ICD-10-CM | POA: Diagnosis not present

## 2023-08-31 DIAGNOSIS — H401122 Primary open-angle glaucoma, left eye, moderate stage: Secondary | ICD-10-CM | POA: Diagnosis not present

## 2023-09-10 ENCOUNTER — Encounter (HOSPITAL_COMMUNITY): Payer: Medicare Other | Admitting: Cardiology

## 2023-09-29 ENCOUNTER — Ambulatory Visit: Payer: Medicare Other | Admitting: Physician Assistant

## 2023-10-08 ENCOUNTER — Ambulatory Visit (HOSPITAL_COMMUNITY)
Admission: RE | Admit: 2023-10-08 | Discharge: 2023-10-08 | Disposition: A | Discharge status: Home / Self Care | Payer: Medicare Other | Source: Ambulatory Visit | Attending: Cardiology | Admitting: Cardiology

## 2023-10-08 ENCOUNTER — Other Ambulatory Visit (HOSPITAL_COMMUNITY): Payer: Self-pay

## 2023-10-08 ENCOUNTER — Encounter (HOSPITAL_COMMUNITY): Payer: Self-pay | Admitting: Cardiology

## 2023-10-08 ENCOUNTER — Telehealth (HOSPITAL_COMMUNITY): Payer: Self-pay

## 2023-10-08 VITALS — BP 142/92 | HR 83 | Wt 196.4 lb

## 2023-10-08 DIAGNOSIS — I5022 Chronic systolic (congestive) heart failure: Secondary | ICD-10-CM | POA: Diagnosis not present

## 2023-10-08 DIAGNOSIS — I5020 Unspecified systolic (congestive) heart failure: Secondary | ICD-10-CM

## 2023-10-08 DIAGNOSIS — E1169 Type 2 diabetes mellitus with other specified complication: Secondary | ICD-10-CM

## 2023-10-08 DIAGNOSIS — R9431 Abnormal electrocardiogram [ECG] [EKG]: Secondary | ICD-10-CM | POA: Insufficient documentation

## 2023-10-08 DIAGNOSIS — E854 Organ-limited amyloidosis: Secondary | ICD-10-CM | POA: Diagnosis not present

## 2023-10-08 DIAGNOSIS — N183 Chronic kidney disease, stage 3 unspecified: Secondary | ICD-10-CM | POA: Insufficient documentation

## 2023-10-08 DIAGNOSIS — I43 Cardiomyopathy in diseases classified elsewhere: Secondary | ICD-10-CM | POA: Diagnosis not present

## 2023-10-08 DIAGNOSIS — E1122 Type 2 diabetes mellitus with diabetic chronic kidney disease: Secondary | ICD-10-CM | POA: Diagnosis not present

## 2023-10-08 DIAGNOSIS — I130 Hypertensive heart and chronic kidney disease with heart failure and stage 1 through stage 4 chronic kidney disease, or unspecified chronic kidney disease: Secondary | ICD-10-CM | POA: Insufficient documentation

## 2023-10-08 DIAGNOSIS — E785 Hyperlipidemia, unspecified: Secondary | ICD-10-CM | POA: Insufficient documentation

## 2023-10-08 DIAGNOSIS — E669 Obesity, unspecified: Secondary | ICD-10-CM | POA: Insufficient documentation

## 2023-10-08 DIAGNOSIS — Z87891 Personal history of nicotine dependence: Secondary | ICD-10-CM | POA: Diagnosis not present

## 2023-10-08 LAB — HEMOGLOBIN A1C
Hgb A1c MFr Bld: 6.5 % — ABNORMAL HIGH (ref 4.8–5.6)
Mean Plasma Glucose: 139.85 mg/dL

## 2023-10-08 LAB — BASIC METABOLIC PANEL
Anion gap: 8 (ref 5–15)
BUN: 28 mg/dL — ABNORMAL HIGH (ref 8–23)
CO2: 26 mmol/L (ref 22–32)
Calcium: 8.8 mg/dL — ABNORMAL LOW (ref 8.9–10.3)
Chloride: 107 mmol/L (ref 98–111)
Creatinine, Ser: 2.25 mg/dL — ABNORMAL HIGH (ref 0.61–1.24)
GFR, Estimated: 28 mL/min — ABNORMAL LOW (ref >60)
Glucose, Bld: 74 mg/dL (ref 70–99)
Potassium: 4.2 mmol/L (ref 3.5–5.1)
Sodium: 141 mmol/L (ref 135–145)

## 2023-10-08 LAB — LIPID PANEL
Cholesterol: 151 mg/dL (ref 0–200)
HDL: 53 mg/dL (ref >40)
LDL Cholesterol: 85 mg/dL (ref 0–99)
Total CHOL/HDL Ratio: 2.8 {ratio}
Triglycerides: 65 mg/dL (ref <150)
VLDL: 13 mg/dL (ref 0–40)

## 2023-10-08 LAB — TSH: TSH: 1.178 u[IU]/mL (ref 0.350–4.500)

## 2023-10-08 LAB — BRAIN NATRIURETIC PEPTIDE: B Natriuretic Peptide: 771 pg/mL — ABNORMAL HIGH (ref 0.0–100.0)

## 2023-10-08 MED ORDER — AMLODIPINE BESYLATE 2.5 MG PO TABS: 2.5000 mg | ORAL_TABLET | Freq: Every day | ORAL | 3 refills | Status: DC

## 2023-10-08 NOTE — Progress Notes (Addendum)
ADVANCED HEART FAILURE CLINIC NOTE  Referring Physician: Renford Dills, MD  Primary Care: Renford Dills, MD Primary Cardiologist: Dr. Herbie Baltimore  HPI: Steven Myers is a 88 y.o. male with HFrEF, cardiac amyloid, T2DM, HLD, obesity, HTN & CKD III presenitng today to establish care. He is currently followed by Dr. Herbie Baltimore and referred today for further evaluation for amyloid & HFrEF. Based on chart review his cardiac history dates back to at least 2023 when TTE demonstrated LVEF of 45-50% with severe LVH and inferolateral hypokinesis. He had a SPEP in 8/24 negative for monocolonal gammopathy followed by a PYP scan on 07/22/23  Interval hx:  - From a functional standpoint, he reports that he has 13 steps in his home that he walks up and down throughout the day with mild-moderate fatigue.  - He uses a cane to walk due to arthritis in the knees. Does not walk much outside his home due to fear of falling. He feels limited by shortness of breath and weakness in his legs.  - No peripheral neuropathy or low back pain.  - No known family history of amyloid.   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 05/2018 Echo: EF 60-65%, no rwma, GrI DD. Mildly dil Ao root. Mild BAE; b. 05/2022 Echo: EF 45-50%, inferior basal HK. Sev LVH, GrI DD, mildly enlarged RV w/ mildly reduced fxn. Trivm MR. AoV sclerosis. Asc Ao 39mm.   Chest pain    a. 03/2007 MV: No isch/infarct; b. 04/2010 Lexi MV: EF 62%. No isch/infarct.   CKD (chronic kidney disease) stage 3, GFR 30-59  ml/min (HCC)    DM (diabetes mellitus) type II controlled with renal manifestation (HCC)    Not on insulin   Essential hypertension    Glaucoma    Optho: Dr. Cathey Endow   History of colonoscopy    Hyperlipidemia associated with type 2 diabetes mellitus (HCC)    Intolerant to statins --> because of elevated LFTs.   Obesity due to excess calories without serious comorbidity    BMI ~32 - mostly truncal obesity (mesomorphic)   Osteoarthritis of hip    Right > Left -- limits walking   RBBB (right bundle branch block)    Venous stasis dermatitis of both lower extremities - with edema    L leg worse than R 2/2 prior injury.; controlled somewhat with support hose    Current Outpatient Medications  Medication Sig Dispense Refill   amLODipine (NORVASC) 2.5 MG tablet Take 1 tablet (2.5 mg total) by mouth daily. 180 tablet 3   artificial tears (LACRILUBE) OINT ophthalmic ointment Place 1 Application into both eyes See admin instructions. Place as directed into both eyes as directed at bedtime- 5 minutes after Travoprost     Artificial Tears ophthalmic solution Place 1 drop into both eyes 3 (three) times daily.     aspirin 81 MG tablet Take 81 mg by mouth daily.       brimonidine-timolol (COMBIGAN) 0.2-0.5 % ophthalmic solution Place 1 drop into both eyes in the morning and at bedtime.  empagliflozin (JARDIANCE) 10 MG TABS tablet Take 1 tablet (10 mg total) by mouth daily before breakfast. 90 tablet 3   ezetimibe (ZETIA) 10 MG tablet Take 10 mg by mouth daily.  1   metoprolol succinate (TOPROL-XL) 100 MG 24 hr tablet take 1 tablet by mouth once daily 90 tablet 3   RHOPRESSA 0.02 % SOLN Place 1 drop into both eyes at bedtime.     torsemide (DEMADEX) 20 MG tablet Take 1 tablet (20 mg total) by mouth daily.     trandolapril (MAVIK) 4 MG tablet Take 4 mg by mouth daily.     Travoprost, BAK Free, (TRAVATAN Z) 0.004 % SOLN ophthalmic solution Place 1 drop into both eyes at bedtime.     No current  facility-administered medications for this visit.    No Known Allergies    Social History   Socioeconomic History   Marital status: Married    Spouse name: Not on file   Number of children: 2   Years of education: Not on file   Highest education level: Not on file  Occupational History   Occupation: Paediatric nurse    Comment: self employed  Tobacco Use   Smoking status: Former    Current packs/day: 0.00    Types: Cigarettes    Quit date: 1980    Years since quitting: 44.9   Smokeless tobacco: Never   Tobacco comments:    quit 40 years ago  Vaping Use   Vaping status: Never Used  Substance and Sexual Activity   Alcohol use: Not Currently    Comment: quit over 20 yr ago   Drug use: No   Sexual activity: Not Currently  Other Topics Concern   Not on file  Social History Narrative   He is married to his wife -Steven Myers ([place].   Accompanied by his daughter.   Still works as a Paediatric nurse    Slow decline in activity level - no routine exercise   Social Determinants of Corporate investment banker Strain: Not on file  Food Insecurity: No Food Insecurity (11/14/2022)   Hunger Vital Sign    Worried About Running Out of Food in the Last Year: Never true    Ran Out of Food in the Last Year: Never true  Transportation Needs: No Transportation Needs (11/14/2022)   PRAPARE - Administrator, Civil Service (Medical): No    Lack of Transportation (Non-Medical): No  Physical Activity: Not on file  Stress: Not on file  Social Connections: Not on file  Intimate Partner Violence: Not At Risk (11/14/2022)   Humiliation, Afraid, Rape, and Kick questionnaire    Fear of Current or Ex-Partner: No    Emotionally Abused: No    Physically Abused: No    Sexually Abused: No      Family History  Problem Relation Age of Onset   Diabetes Mother    Heart disease Father 28   Colon cancer Neg Hx     PHYSICAL EXAM: There were no vitals filed for this visit. GENERAL: Well  nourished, well developed, and in no apparent distress at rest.  HEENT: Negative for arcus senilis or xanthelasma. There is no scleral icterus.  The mucous membranes are pink and moist.   NECK: Supple, No masses. Normal carotid upstrokes without bruits. No masses or thyromegaly.    CHEST: There are no chest wall deformities. There is no chest wall tenderness. Respirations are unlabored.  Lungs- CTA B/L CARDIAC:  JVP: 9 cm H2O  Normal S1, S2  Normal rate with regular rhythm. No murmurs, rubs or gallops.  Pulses are 2+ and symmetrical in upper and lower extremities. trace edema.  ABDOMEN: Soft, non-tender, non-distended. There are no masses or hepatomegaly. There are normal bowel sounds.  EXTREMITIES: Warm and well perfused with no cyanosis, clubbing.  LYMPHATIC: No axillary or supraclavicular lymphadenopathy.  NEUROLOGIC: Patient is oriented x3 with no focal or lateralizing neurologic deficits.  PSYCH: Patients affect is appropriate, there is no evidence of anxiety or depression.  SKIN: Warm and dry; no lesions or wounds.   DATA REVIEW  ECG: 11/14/22: NSR w/ PVC, RBBB  As per my personal interpretation  ECHO: 09/11/22: LVEF 45-50%, severe LVH As per my personal interpretation  PYP: 07/23/23:    Myocardial uptake was positive for radiotracer uptake. The visual grade of myocardial uptake relative to the ribs was Grade 3 (Myocardial uptake greater than rib uptake with mild/absent rib uptake).   Findings are suggestive (Grade 2 or 3) of cardiac ATTR amyloidosis.   Prior study not available for comparison.    ASSESSMENT & PLAN:  Heart failure with reduced EF Etiology of LK:GMWNUU nonischemic cardiomyopathy; severe LVH on TTE w/ PYP scan suggestive of cardiac amyloidosis.  NYHA class / AHA Stage:III Volume status & Diuretics: torsemide 40mg  daily Vasodilators:amlodipine 2.5mg  daily & trandopril; will repeat labs today and plan to transition to ARB Beta-Blocker:currently on  metoprolol 100mg  daily MRA:N/A; plan to start at follow up.  Cardiometabolic:jardiance 10mg  daily Devices therapies & Valvulopathies:not indicated Advanced therapies:not indicated  2. Cardiac amyloid - Negative SPEP in 8/24 - PYP Grade II/III on 07/22/23 - Extensive discussion with his daughter, son-in-law and patient today regarding screening of family members for amyloid and obtaining genetic testing for him. Will refer to Dr. Doristine Locks.  - Plan to start treatment with Tafamadis.   3. Hypertension - Well controlled on amlodipine 2.5mg  daily, toprol 100mg  daily and trandolapril 4mg  daily   4. CKD Stage III - Continue jardiance 10mg  daily  - repeat labs today.    5. Lower extremity weakness - Reports that he has slowly had decrease in his ability to walk and ambulate due to severe arthritis.   I spent 45 minutes caring for this patient today including face to face time, ordering and reviewing labs, reviewing records from Dr. Rogelia Rohrer (07/30/23), reviewing amyloid lab work, discussing genetic testing, seeing the patient, documenting in the record, and arranging follow ups.    Eian Vandervelden Advanced Heart Failure Mechanical Circulatory Support

## 2023-10-08 NOTE — Telephone Encounter (Signed)
-----   Message from [facility] sent at 10/08/2023  1:56 PM EST ----- His kidney numbers are worse and suggestive that he has a little extra fluid on him. Can we please ask him to take an extra torsemide today.   Steven Myers

## 2023-10-08 NOTE — Telephone Encounter (Signed)
Called and spoke with patients wife (okay per DPR) advised her of the below- patient's wife aware and verbalized understanding.

## 2023-10-08 NOTE — Patient Instructions (Signed)
Medication Changes:  No Changes In Medications at this time.   Lab Work:  Labs done today, your results will be available in MyChart, we will contact you for abnormal readings.  Referrals:  YOU HAVE BEEN REFERRED TO [facility]ET[facility]  THEY WILL REACH OUT TO YOU OR CALL TO ARRANGE THIS. PLEASE CALL us WITH ANY CONCERNS   Follow-Up in: 2 MONTHS PLEASE CALL OUR OFFICE AROUND DECEMBER TO GET SCHEDULED FOR YOUR APPOINTMENT. PHONE NUMBER IS [phone] OPTION 2   At the Advanced [facility] Failure Clinic, you and your health needs are our priority. We have a designated team specialized in the treatment of Heart Failure. This Care Team includes your primary Heart Failure Specialized Cardiologist (physician), Advanced Practice Providers (APPs- Physician Assistants and Nurse Practitioners), and Pharmacist who all work together to provide you with the care you need, when you need it.   You may see any of the following providers on your designated Care Team at your next follow up:  Dr. Arvilla Meres Dr. Marca Ancona Dr. Dorthula Nettles Dr. Theresia Bough Tonye Becket, NP Robbie Lis, [place] [facility] [place], [place] Brynda Peon, NP [place] Lee, NP Karle Plumber, PharmD   Please be sure to bring in all your medications bottles to every appointment.   Need to Contact us:  If you have any questions or concerns before your next appointment please send [place] a message through [employer] or call our office at [phone].    TO LEAVE A MESSAGE FOR THE NURSE SELECT OPTION 2, PLEASE LEAVE A MESSAGE INCLUDING: YOUR NAME DATE OF BIRTH CALL BACK NUMBER REASON FOR CALL**this is important as we prioritize the call backs  YOU WILL RECEIVE A CALL BACK THE SAME DAY AS LONG AS YOU CALL BEFORE 4:00 PM

## 2023-10-14 ENCOUNTER — Telehealth (HOSPITAL_COMMUNITY): Payer: Self-pay | Admitting: Pharmacy Technician

## 2023-10-14 ENCOUNTER — Other Ambulatory Visit (HOSPITAL_COMMUNITY): Payer: Self-pay

## 2023-10-14 NOTE — Telephone Encounter (Signed)
Advanced Heart Failure Patient Advocate Encounter  Prior Authorization for Vyndaqel has been approved.    PA# [id number] Effective dates: 11/23/22 through 11/22/24  Patients co-pay is $2,121.97 (insurance limits to 30 days)  Called and spoke with patient. He directed me to call daughter Steven Myers to discuss. Called and spoke with Steven Myers. Explained that the medication will be shipped out next week and that the pharmacy will call each month to set up a refill. She will be point of contact for the specialty pharmacy.  Steven Myers, CPhT

## 2023-10-14 NOTE — Telephone Encounter (Signed)
Advanced Heart Failure Patient Advocate Encounter  The patient was approved for a [employer] grant that will help cover the cost of Vyndaqel. Total amount awarded, $10,000. Eligibility, 09/14/23 - 09/12/24.  ID [id number]  BIN [id number]  PCN [id number]  Group [id number]  Archer Asa, CPhT

## 2023-10-14 NOTE — Telephone Encounter (Signed)
Patient Advocate Encounter   Received notification from [facility] that prior authorization for Vyndaqel is required.   PA submitted on CoverMyMeds Key [id number] Status is pending   Will continue to follow.

## 2023-10-18 ENCOUNTER — Encounter (HOSPITAL_COMMUNITY): Payer: Self-pay

## 2023-10-18 ENCOUNTER — Other Ambulatory Visit (HOSPITAL_COMMUNITY): Payer: Self-pay | Admitting: Pharmacist

## 2023-10-18 ENCOUNTER — Other Ambulatory Visit: Payer: Self-pay

## 2023-10-18 ENCOUNTER — Other Ambulatory Visit (HOSPITAL_COMMUNITY): Payer: Self-pay

## 2023-10-18 ENCOUNTER — Other Ambulatory Visit (HOSPITAL_COMMUNITY): Payer: Self-pay | Admitting: Pharmacy Technician

## 2023-10-18 MED ORDER — VYNDAQEL 20 MG PO CAPS
80.0000 mg | ORAL_CAPSULE | Freq: Every day | ORAL | 11 refills | Status: DC
  Filled 2023-10-18 (×2): qty 120, 30d supply, fill #0
  Filled 2023-11-10: qty 120, 30d supply, fill #1
  Filled 2023-12-07: qty 120, 30d supply, fill #2
  Filled 2024-01-06: qty 120, 30d supply, fill #3

## 2023-10-18 NOTE — Progress Notes (Signed)
Specialty Pharmacy Initiation Note   Steven Myers is a 88 y.o. male who will be followed by the specialty pharmacy service for RxSp Cardiology    Review of administration, indication, effectiveness, safety, potential side effects, storage/disposable, and missed dose instructions occurred today for patient's specialty medication(s) Tafamidis Meglumine (Cardiac)     Patient/Caregiver did not have any additional questions or concerns.   Patient's therapy is appropriate to: Initiate    Goals Addressed             This Visit's Progress    Maintain optimal adherence to therapy       Patient is initiating therapy. Patient will maintain adherence         [name] Specialty Pharmacist

## 2023-10-18 NOTE — Progress Notes (Signed)
Specialty Pharmacy Initial Fill Coordination Note  Oved Palmberg is a 88 y.o. male contacted today regarding refills of specialty medication(s) Tafamidis Meglumine (Cardiac)   Patient requested Delivery   Delivery date: 10/20/23   Verified address: [address], [place] [address]   Medication will be filled on Tues, 11/26.   Patient is aware of $2,127 copayment. Kennedy Bucker will take care of the co-pay and has been entered into [place].

## 2023-10-19 ENCOUNTER — Other Ambulatory Visit (HOSPITAL_COMMUNITY): Payer: Self-pay

## 2023-10-19 ENCOUNTER — Other Ambulatory Visit: Payer: Self-pay

## 2023-10-26 ENCOUNTER — Other Ambulatory Visit (HOSPITAL_COMMUNITY): Payer: Self-pay

## 2023-10-29 ENCOUNTER — Ambulatory Visit (INDEPENDENT_AMBULATORY_CARE_PROVIDER_SITE_OTHER): Payer: Medicare Other | Admitting: Podiatry

## 2023-10-29 ENCOUNTER — Encounter: Payer: Self-pay | Admitting: Podiatry

## 2023-10-29 DIAGNOSIS — M79675 Pain in left toe(s): Secondary | ICD-10-CM | POA: Diagnosis not present

## 2023-10-29 DIAGNOSIS — B351 Tinea unguium: Secondary | ICD-10-CM | POA: Diagnosis not present

## 2023-10-29 DIAGNOSIS — M79674 Pain in right toe(s): Secondary | ICD-10-CM | POA: Diagnosis not present

## 2023-10-29 DIAGNOSIS — E1122 Type 2 diabetes mellitus with diabetic chronic kidney disease: Secondary | ICD-10-CM | POA: Diagnosis not present

## 2023-10-29 NOTE — Progress Notes (Signed)
This patient returns to my office for at risk foot care.  This patient requires this care by a professional since this patient will be at risk due to having diabetes. and CKD.   This patient is unable to cut nails himself since the patient cannot reach his nails.These nails are painful walking and wearing shoes.  This patient presents for at risk foot care today.  General Appearance  Alert, conversant and in no acute stress.  Vascular  Dorsalis pedis and posterior tibial  pulses are weaklypalpable  bilaterally.  Capillary return is within normal limits  bilaterally. Temperature is within normal limits  bilaterally.  Neurologic  Senn-Weinstein monofilament wire test within normal limits  bilaterally. Muscle power within normal limits bilaterally.  Nails Thick disfigured discolored nails with subungual debris  from hallux to fifth toes bilaterally. No evidence of bacterial infection or drainage bilaterally.  Orthopedic  No limitations of motion  feet .  No crepitus or effusions noted.  No bony pathology or digital deformities noted.  Skin  normotropic skin with no porokeratosis noted bilaterally.  No signs of infections or ulcers noted.     Onychomycosis  Pain in right toes  Pain in left toes  Consent was obtained for treatment procedures.   Mechanical debridement of nails 1-5  bilaterally performed with a nail nipper.  Filed with dremel without incident.    Return office visit    3 months                 Told patient to return for periodic foot care and evaluation due to potential at risk complications.   Huldah Marin DPM  

## 2023-11-03 ENCOUNTER — Other Ambulatory Visit: Payer: Self-pay

## 2023-11-05 DIAGNOSIS — H401133 Primary open-angle glaucoma, bilateral, severe stage: Secondary | ICD-10-CM | POA: Diagnosis not present

## 2023-11-10 ENCOUNTER — Other Ambulatory Visit: Payer: Self-pay

## 2023-11-10 NOTE — Progress Notes (Signed)
Specialty Pharmacy Refill Coordination Note  Steven Myers is a 88 y.o. male contacted today regarding refills of specialty medication(s) Tafamidis Meglumine (Cardiac) [name])   Patient requested Delivery   Delivery date: 11/15/23   Verified address: [address], [place] [address]   Medication will be filled on 12.20.24.

## 2023-11-12 ENCOUNTER — Other Ambulatory Visit: Payer: Self-pay

## 2023-11-12 DIAGNOSIS — S81802A Unspecified open wound, left lower leg, initial encounter: Secondary | ICD-10-CM | POA: Diagnosis not present

## 2023-11-12 DIAGNOSIS — N1832 Chronic kidney disease, stage 3b: Secondary | ICD-10-CM | POA: Diagnosis not present

## 2023-11-12 DIAGNOSIS — E7800 Pure hypercholesterolemia, unspecified: Secondary | ICD-10-CM | POA: Diagnosis not present

## 2023-11-12 DIAGNOSIS — R413 Other amnesia: Secondary | ICD-10-CM | POA: Diagnosis not present

## 2023-11-12 DIAGNOSIS — R296 Repeated falls: Secondary | ICD-10-CM | POA: Diagnosis not present

## 2023-11-12 DIAGNOSIS — E1122 Type 2 diabetes mellitus with diabetic chronic kidney disease: Secondary | ICD-10-CM | POA: Diagnosis not present

## 2023-11-12 DIAGNOSIS — I10 Essential (primary) hypertension: Secondary | ICD-10-CM | POA: Diagnosis not present

## 2023-11-12 DIAGNOSIS — I429 Cardiomyopathy, unspecified: Secondary | ICD-10-CM | POA: Diagnosis not present

## 2023-11-12 DIAGNOSIS — Z23 Encounter for immunization: Secondary | ICD-10-CM | POA: Diagnosis not present

## 2023-11-12 DIAGNOSIS — I700 Atherosclerosis of aorta: Secondary | ICD-10-CM | POA: Diagnosis not present

## 2023-11-20 ENCOUNTER — Inpatient Hospital Stay (HOSPITAL_COMMUNITY)
Admission: EM | Admit: 2023-11-20 | Discharge: 2023-11-26 | DRG: 280 | Disposition: A | Discharge status: Skilled Nursing Facility | Payer: Medicare Other | Attending: Internal Medicine | Admitting: Internal Medicine

## 2023-11-20 ENCOUNTER — Emergency Department (HOSPITAL_COMMUNITY): Payer: Medicare Other

## 2023-11-20 ENCOUNTER — Other Ambulatory Visit: Payer: Self-pay

## 2023-11-20 ENCOUNTER — Encounter (HOSPITAL_COMMUNITY): Payer: Self-pay | Admitting: *Deleted

## 2023-11-20 DIAGNOSIS — D649 Anemia, unspecified: Secondary | ICD-10-CM | POA: Diagnosis present

## 2023-11-20 DIAGNOSIS — F0394 Unspecified dementia, unspecified severity, with anxiety: Secondary | ICD-10-CM | POA: Diagnosis present

## 2023-11-20 DIAGNOSIS — D509 Iron deficiency anemia, unspecified: Secondary | ICD-10-CM | POA: Diagnosis present

## 2023-11-20 DIAGNOSIS — J9601 Acute respiratory failure with hypoxia: Secondary | ICD-10-CM | POA: Diagnosis not present

## 2023-11-20 DIAGNOSIS — E872 Acidosis, unspecified: Secondary | ICD-10-CM | POA: Diagnosis present

## 2023-11-20 DIAGNOSIS — Z6824 Body mass index (BMI) 24.0-24.9, adult: Secondary | ICD-10-CM

## 2023-11-20 DIAGNOSIS — F05 Delirium due to known physiological condition: Secondary | ICD-10-CM | POA: Diagnosis present

## 2023-11-20 DIAGNOSIS — R911 Solitary pulmonary nodule: Secondary | ICD-10-CM | POA: Diagnosis present

## 2023-11-20 DIAGNOSIS — E785 Hyperlipidemia, unspecified: Secondary | ICD-10-CM | POA: Diagnosis present

## 2023-11-20 DIAGNOSIS — H409 Unspecified glaucoma: Secondary | ICD-10-CM | POA: Diagnosis present

## 2023-11-20 DIAGNOSIS — T465X5A Adverse effect of other antihypertensive drugs, initial encounter: Secondary | ICD-10-CM | POA: Diagnosis not present

## 2023-11-20 DIAGNOSIS — I43 Cardiomyopathy in diseases classified elsewhere: Secondary | ICD-10-CM | POA: Diagnosis not present

## 2023-11-20 DIAGNOSIS — N1832 Chronic kidney disease, stage 3b: Secondary | ICD-10-CM | POA: Diagnosis not present

## 2023-11-20 DIAGNOSIS — R0902 Hypoxemia: Secondary | ICD-10-CM

## 2023-11-20 DIAGNOSIS — Y848 Other medical procedures as the cause of abnormal reaction of the patient, or of later complication, without mention of misadventure at the time of the procedure: Secondary | ICD-10-CM | POA: Diagnosis not present

## 2023-11-20 DIAGNOSIS — I130 Hypertensive heart and chronic kidney disease with heart failure and stage 1 through stage 4 chronic kidney disease, or unspecified chronic kidney disease: Principal | ICD-10-CM | POA: Diagnosis present

## 2023-11-20 DIAGNOSIS — R55 Syncope and collapse: Secondary | ICD-10-CM | POA: Diagnosis not present

## 2023-11-20 DIAGNOSIS — D696 Thrombocytopenia, unspecified: Secondary | ICD-10-CM | POA: Diagnosis not present

## 2023-11-20 DIAGNOSIS — Z781 Physical restraint status: Secondary | ICD-10-CM

## 2023-11-20 DIAGNOSIS — Z7982 Long term (current) use of aspirin: Secondary | ICD-10-CM

## 2023-11-20 DIAGNOSIS — F03911 Unspecified dementia, unspecified severity, with agitation: Secondary | ICD-10-CM | POA: Diagnosis present

## 2023-11-20 DIAGNOSIS — N281 Cyst of kidney, acquired: Secondary | ICD-10-CM | POA: Diagnosis not present

## 2023-11-20 DIAGNOSIS — R109 Unspecified abdominal pain: Secondary | ICD-10-CM | POA: Diagnosis not present

## 2023-11-20 DIAGNOSIS — Z515 Encounter for palliative care: Secondary | ICD-10-CM

## 2023-11-20 DIAGNOSIS — I87323 Chronic venous hypertension (idiopathic) with inflammation of bilateral lower extremity: Secondary | ICD-10-CM | POA: Diagnosis not present

## 2023-11-20 DIAGNOSIS — R0682 Tachypnea, not elsewhere classified: Secondary | ICD-10-CM | POA: Diagnosis not present

## 2023-11-20 DIAGNOSIS — L97218 Non-pressure chronic ulcer of right calf with other specified severity: Secondary | ICD-10-CM | POA: Diagnosis not present

## 2023-11-20 DIAGNOSIS — R7989 Other specified abnormal findings of blood chemistry: Secondary | ICD-10-CM | POA: Diagnosis present

## 2023-11-20 DIAGNOSIS — I70232 Atherosclerosis of native arteries of right leg with ulceration of calf: Secondary | ICD-10-CM | POA: Diagnosis not present

## 2023-11-20 DIAGNOSIS — R778 Other specified abnormalities of plasma proteins: Secondary | ICD-10-CM | POA: Diagnosis not present

## 2023-11-20 DIAGNOSIS — I5023 Acute on chronic systolic (congestive) heart failure: Secondary | ICD-10-CM | POA: Diagnosis not present

## 2023-11-20 DIAGNOSIS — I5022 Chronic systolic (congestive) heart failure: Secondary | ICD-10-CM | POA: Diagnosis not present

## 2023-11-20 DIAGNOSIS — E854 Organ-limited amyloidosis: Secondary | ICD-10-CM | POA: Diagnosis present

## 2023-11-20 DIAGNOSIS — Z833 Family history of diabetes mellitus: Secondary | ICD-10-CM

## 2023-11-20 DIAGNOSIS — R1900 Intra-abdominal and pelvic swelling, mass and lump, unspecified site: Secondary | ICD-10-CM | POA: Diagnosis not present

## 2023-11-20 DIAGNOSIS — I509 Heart failure, unspecified: Secondary | ICD-10-CM

## 2023-11-20 DIAGNOSIS — M160 Bilateral primary osteoarthritis of hip: Secondary | ICD-10-CM | POA: Diagnosis present

## 2023-11-20 DIAGNOSIS — N179 Acute kidney failure, unspecified: Secondary | ICD-10-CM | POA: Diagnosis not present

## 2023-11-20 DIAGNOSIS — Z1152 Encounter for screening for COVID-19: Secondary | ICD-10-CM | POA: Diagnosis not present

## 2023-11-20 DIAGNOSIS — Z452 Encounter for adjustment and management of vascular access device: Secondary | ICD-10-CM | POA: Diagnosis not present

## 2023-11-20 DIAGNOSIS — Z87891 Personal history of nicotine dependence: Secondary | ICD-10-CM

## 2023-11-20 DIAGNOSIS — I771 Stricture of artery: Secondary | ICD-10-CM | POA: Diagnosis not present

## 2023-11-20 DIAGNOSIS — R441 Visual hallucinations: Secondary | ICD-10-CM | POA: Diagnosis present

## 2023-11-20 DIAGNOSIS — D61818 Other pancytopenia: Secondary | ICD-10-CM | POA: Diagnosis present

## 2023-11-20 DIAGNOSIS — Z66 Do not resuscitate: Secondary | ICD-10-CM | POA: Diagnosis not present

## 2023-11-20 DIAGNOSIS — N184 Chronic kidney disease, stage 4 (severe): Secondary | ICD-10-CM | POA: Diagnosis present

## 2023-11-20 DIAGNOSIS — I21A1 Myocardial infarction type 2: Secondary | ICD-10-CM | POA: Diagnosis not present

## 2023-11-20 DIAGNOSIS — I5043 Acute on chronic combined systolic (congestive) and diastolic (congestive) heart failure: Secondary | ICD-10-CM | POA: Diagnosis present

## 2023-11-20 DIAGNOSIS — I517 Cardiomegaly: Secondary | ICD-10-CM | POA: Diagnosis not present

## 2023-11-20 DIAGNOSIS — I10 Essential (primary) hypertension: Secondary | ICD-10-CM | POA: Diagnosis not present

## 2023-11-20 DIAGNOSIS — E6609 Other obesity due to excess calories: Secondary | ICD-10-CM | POA: Diagnosis present

## 2023-11-20 DIAGNOSIS — Z8249 Family history of ischemic heart disease and other diseases of the circulatory system: Secondary | ICD-10-CM

## 2023-11-20 DIAGNOSIS — M6281 Muscle weakness (generalized): Secondary | ICD-10-CM | POA: Diagnosis not present

## 2023-11-20 DIAGNOSIS — F03C18 Unspecified dementia, severe, with other behavioral disturbance: Secondary | ICD-10-CM | POA: Diagnosis not present

## 2023-11-20 DIAGNOSIS — R410 Disorientation, unspecified: Principal | ICD-10-CM

## 2023-11-20 DIAGNOSIS — R4189 Other symptoms and signs involving cognitive functions and awareness: Secondary | ICD-10-CM | POA: Diagnosis present

## 2023-11-20 DIAGNOSIS — R531 Weakness: Secondary | ICD-10-CM | POA: Diagnosis not present

## 2023-11-20 DIAGNOSIS — Z7984 Long term (current) use of oral hypoglycemic drugs: Secondary | ICD-10-CM

## 2023-11-20 DIAGNOSIS — I5082 Biventricular heart failure: Secondary | ICD-10-CM | POA: Diagnosis not present

## 2023-11-20 DIAGNOSIS — R0602 Shortness of breath: Secondary | ICD-10-CM | POA: Diagnosis not present

## 2023-11-20 DIAGNOSIS — Z7401 Bed confinement status: Secondary | ICD-10-CM | POA: Diagnosis not present

## 2023-11-20 DIAGNOSIS — I4510 Unspecified right bundle-branch block: Secondary | ICD-10-CM | POA: Diagnosis not present

## 2023-11-20 DIAGNOSIS — R404 Transient alteration of awareness: Secondary | ICD-10-CM | POA: Diagnosis not present

## 2023-11-20 DIAGNOSIS — H44519 Absolute glaucoma, unspecified eye: Secondary | ICD-10-CM | POA: Diagnosis not present

## 2023-11-20 DIAGNOSIS — E1169 Type 2 diabetes mellitus with other specified complication: Secondary | ICD-10-CM | POA: Diagnosis present

## 2023-11-20 DIAGNOSIS — R4182 Altered mental status, unspecified: Secondary | ICD-10-CM | POA: Diagnosis not present

## 2023-11-20 DIAGNOSIS — Z79899 Other long term (current) drug therapy: Secondary | ICD-10-CM

## 2023-11-20 DIAGNOSIS — N3289 Other specified disorders of bladder: Secondary | ICD-10-CM | POA: Diagnosis present

## 2023-11-20 DIAGNOSIS — E1129 Type 2 diabetes mellitus with other diabetic kidney complication: Secondary | ICD-10-CM | POA: Diagnosis not present

## 2023-11-20 DIAGNOSIS — T463X5A Adverse effect of coronary vasodilators, initial encounter: Secondary | ICD-10-CM | POA: Diagnosis not present

## 2023-11-20 DIAGNOSIS — N400 Enlarged prostate without lower urinary tract symptoms: Secondary | ICD-10-CM | POA: Diagnosis present

## 2023-11-20 DIAGNOSIS — R41841 Cognitive communication deficit: Secondary | ICD-10-CM | POA: Diagnosis not present

## 2023-11-20 DIAGNOSIS — R609 Edema, unspecified: Secondary | ICD-10-CM | POA: Diagnosis not present

## 2023-11-20 DIAGNOSIS — R451 Restlessness and agitation: Secondary | ICD-10-CM | POA: Diagnosis not present

## 2023-11-20 DIAGNOSIS — R188 Other ascites: Secondary | ICD-10-CM | POA: Diagnosis not present

## 2023-11-20 DIAGNOSIS — I5084 End stage heart failure: Secondary | ICD-10-CM | POA: Diagnosis not present

## 2023-11-20 DIAGNOSIS — E1122 Type 2 diabetes mellitus with diabetic chronic kidney disease: Secondary | ICD-10-CM | POA: Diagnosis present

## 2023-11-20 DIAGNOSIS — I469 Cardiac arrest, cause unspecified: Secondary | ICD-10-CM | POA: Diagnosis not present

## 2023-11-20 DIAGNOSIS — I952 Hypotension due to drugs: Secondary | ICD-10-CM | POA: Diagnosis not present

## 2023-11-20 DIAGNOSIS — M7989 Other specified soft tissue disorders: Secondary | ICD-10-CM | POA: Diagnosis not present

## 2023-11-20 DIAGNOSIS — I214 Non-ST elevation (NSTEMI) myocardial infarction: Secondary | ICD-10-CM | POA: Diagnosis not present

## 2023-11-20 LAB — CBC WITH DIFFERENTIAL/PLATELET
Abs Immature Granulocytes: 0.01 10*3/uL (ref 0.00–0.07)
Basophils Absolute: 0 10*3/uL (ref 0.0–0.1)
Basophils Relative: 1 %
Eosinophils Absolute: 0.1 10*3/uL (ref 0.0–0.5)
Eosinophils Relative: 3 %
HCT: 34.1 % — ABNORMAL LOW (ref 39.0–52.0)
Hemoglobin: 10.7 g/dL — ABNORMAL LOW (ref 13.0–17.0)
Immature Granulocytes: 0 %
Lymphocytes Relative: 14 %
Lymphs Abs: 0.5 10*3/uL — ABNORMAL LOW (ref 0.7–4.0)
MCH: 29.6 pg (ref 26.0–34.0)
MCHC: 31.4 g/dL (ref 30.0–36.0)
MCV: 94.2 fL (ref 80.0–100.0)
Monocytes Absolute: 0.4 10*3/uL (ref 0.1–1.0)
Monocytes Relative: 11 %
Neutro Abs: 2.5 10*3/uL (ref 1.7–7.7)
Neutrophils Relative %: 71 %
Platelets: 124 10*3/uL — ABNORMAL LOW (ref 150–400)
RBC: 3.62 MIL/uL — ABNORMAL LOW (ref 4.22–5.81)
RDW: 16.1 % — ABNORMAL HIGH (ref 11.5–15.5)
WBC: 3.5 10*3/uL — ABNORMAL LOW (ref 4.0–10.5)
nRBC: 0 % (ref 0.0–0.2)

## 2023-11-20 NOTE — ED Triage Notes (Signed)
Per report by EMS, Called out for AMS by pt wife, tachypnea, fluid on his abdomen. SOB. Urinary symptoms x 1 week. A/O x 4, issues with time line. 150/120, hr 80'sRBBB, 84% on RA, placed on NRB, now 100% 15 liters. CBG 187. IV established in the left ac.

## 2023-11-20 NOTE — ED Provider Notes (Signed)
[place] [facility] AT [facility] Provider Note   CSN: [id number] Arrival date & time: 11/20/23  2253     History {Add pertinent medical, surgical, social history, OB history to HPI:1} Chief Complaint  Patient presents with   Altered Mental Status    Steven Myers is a 88 y.o. male.  The history is provided by the patient, the EMS personnel and medical records.  Altered Mental Status Steven Myers is a 88 y.o. male who presents to the Emergency Department complaining of altered mental status.  EMS was called out due to change in mental status per wife with some confusion as well as difficulty breathing.  Patient reports intermittent shortness of breath with dysuria.  He states shortness of breath is worse when he is stressed.  He has an occasional cough that is nonproductive.  He does report some lower extremity edema.  No fevers, nausea, vomiting, diarrhea.  EMS report oxygen sats of 84% on room air.  Patient does report that he is seeing people looking at him through the window and it does stress him out.  He denies any SI, HI or auditory hallucinations.  He was started on medications for heart failure about 1 month ago. History of diabetes, cardiac amyloid, CKD, hypertension     Home Medications Prior to Admission medications   Medication Sig Start Date End Date Taking? Authorizing Provider  amLODipine (NORVASC) 2.5 MG tablet Take 1 tablet (2.5 mg total) by mouth daily. 10/08/23 01/06/24  Sabharwal, Eliezer Lofts, DO  artificial tears (LACRILUBE) OINT ophthalmic ointment Place 1 Application into both eyes See admin instructions. Place as directed into both eyes as directed at bedtime- 5 minutes after Travoprost    [provider]  Artificial Tears ophthalmic solution Place 1 drop into both eyes 3 (three) times daily.    [provider]  aspirin 81 MG tablet Take 81 mg by mouth daily.      [provider]  brimonidine-timolol (COMBIGAN)  0.2-0.5 % ophthalmic solution Place 1 drop into both eyes in the morning and at bedtime.    [provider]  empagliflozin (JARDIANCE) 10 MG TABS tablet Take 1 tablet (10 mg total) by mouth daily before breakfast. 08/11/23   [name] T, PA-C  ezetimibe (ZETIA) 10 MG tablet Take 10 mg by mouth daily. 03/21/18   [provider]  metoprolol succinate (TOPROL-XL) 100 MG 24 hr tablet take 1 tablet by mouth once daily 05/28/12   Anders Simmonds, PA-C  RHOPRESSA 0.02 % SOLN Place 1 drop into both eyes at bedtime. 02/02/23   [provider]  Tafamidis Meglumine, Cardiac, (VYNDAQEL) 20 MG CAPS Take 4 capsules (80 mg total) by mouth daily. 10/18/23   Sabharwal, Aditya, DO  torsemide (DEMADEX) 20 MG tablet Take 1 tablet (20 mg total) by mouth daily. 08/11/23 11/09/23  [name] T, PA-C  trandolapril (MAVIK) 4 MG tablet Take 4 mg by mouth daily.    [provider]  Travoprost, BAK Free, (TRAVATAN Z) 0.004 % SOLN ophthalmic solution Place 1 drop into both eyes at bedtime.    [provider]      Allergies    Patient has no known allergies.    Review of Systems   Review of Systems  All other systems reviewed and are negative.   Physical Exam Updated Vital Signs BP (!) 140/97   Pulse 81   Temp (!) 97.5 F (36.4 C)   Resp (!) 21   SpO2 100%  Physical Exam Vitals and nursing note reviewed.  Constitutional:      Appearance: He is well-developed.  HENT:     Head: Normocephalic and atraumatic.  Cardiovascular:     Rate and Rhythm: Normal rate and regular rhythm.     Heart sounds: No murmur heard. Pulmonary:     Effort: Pulmonary effort is normal. No respiratory distress.     Comments: Fine crackles in the bases bilaterally Abdominal:     Palpations: Abdomen is soft.     Tenderness: There is no abdominal tenderness. There is no guarding or rebound.  Musculoskeletal:        General: No tenderness.     Comments: There is 1+ edema to bilateral  lower extremities.  Skin:    General: Skin is warm and dry.  Neurological:     Mental Status: He is alert and oriented to person, place, and time.     Comments: 5 strength in all 4 extremities with sensation to light touch intact in all 4 extremities  Psychiatric:        Behavior: Behavior normal.     ED Results / Procedures / Treatments   Labs (all labs ordered are listed, but only abnormal results are displayed) Labs Reviewed  COMPREHENSIVE METABOLIC PANEL  CBC WITH DIFFERENTIAL/PLATELET  BRAIN NATRIURETIC PEPTIDE  URINALYSIS, W/ REFLEX TO CULTURE (INFECTION SUSPECTED)  TROPONIN I (HIGH SENSITIVITY)    EKG EKG Interpretation Date/Time:  Saturday November 20 2023 23:00:33 EST Ventricular Rate:  88 PR Interval:  200 QRS Duration:  142 QT Interval:  428 QTC Calculation: 518 R Axis:   -87  Text Interpretation: Sinus rhythm Probable left atrial enlargement Right bundle branch block Inferior infarct, old Anterior infarct, old Lateral leads are also involved Confirmed by Tilden Fossa [phone]) on 11/20/2023 11:05:21 PM  Radiology No results found.  Procedures Procedures  {Document cardiac monitor, telemetry assessment procedure when appropriate:1}  Medications Ordered in ED Medications - No data to display  ED Course/ Medical Decision Making/ A&P   {   Click here for ABCD2, HEART and other calculatorsREFRESH Note before signing :1}                              Medical Decision Making Amount and/or Complexity of Data Reviewed Labs: ordered. Radiology: ordered.   ***  {Document critical care time when appropriate:1} {Document review of labs and clinical decision tools ie heart score, Chads2Vasc2 etc:1}  {Document your independent review of radiology images, and any outside records:1} {Document your discussion with family members, caretakers, and with consultants:1} {Document social determinants of health affecting pt's care:1} {Document your decision making why  or why not admission, treatments were needed:1} Final Clinical Impression(s) / ED Diagnoses Final diagnoses:  None    Rx / DC Orders ED Discharge Orders     None

## 2023-11-20 NOTE — ED Notes (Signed)
Son Aarav Guida [phone] would like an update asap

## 2023-11-21 ENCOUNTER — Emergency Department (HOSPITAL_COMMUNITY): Payer: Medicare Other

## 2023-11-21 ENCOUNTER — Observation Stay (HOSPITAL_BASED_OUTPATIENT_CLINIC_OR_DEPARTMENT_OTHER): Payer: Medicare Other

## 2023-11-21 ENCOUNTER — Encounter (HOSPITAL_COMMUNITY): Payer: Self-pay | Admitting: Family Medicine

## 2023-11-21 DIAGNOSIS — I5023 Acute on chronic systolic (congestive) heart failure: Secondary | ICD-10-CM | POA: Diagnosis not present

## 2023-11-21 DIAGNOSIS — I4510 Unspecified right bundle-branch block: Secondary | ICD-10-CM | POA: Diagnosis not present

## 2023-11-21 DIAGNOSIS — N1832 Chronic kidney disease, stage 3b: Secondary | ICD-10-CM

## 2023-11-21 DIAGNOSIS — Z6824 Body mass index (BMI) 24.0-24.9, adult: Secondary | ICD-10-CM | POA: Diagnosis not present

## 2023-11-21 DIAGNOSIS — D696 Thrombocytopenia, unspecified: Secondary | ICD-10-CM | POA: Diagnosis not present

## 2023-11-21 DIAGNOSIS — I43 Cardiomyopathy in diseases classified elsewhere: Secondary | ICD-10-CM

## 2023-11-21 DIAGNOSIS — M6281 Muscle weakness (generalized): Secondary | ICD-10-CM | POA: Diagnosis not present

## 2023-11-21 DIAGNOSIS — E854 Organ-limited amyloidosis: Secondary | ICD-10-CM

## 2023-11-21 DIAGNOSIS — I517 Cardiomegaly: Secondary | ICD-10-CM | POA: Diagnosis not present

## 2023-11-21 DIAGNOSIS — I70232 Atherosclerosis of native arteries of right leg with ulceration of calf: Secondary | ICD-10-CM | POA: Diagnosis not present

## 2023-11-21 DIAGNOSIS — F05 Delirium due to known physiological condition: Secondary | ICD-10-CM | POA: Diagnosis present

## 2023-11-21 DIAGNOSIS — R109 Unspecified abdominal pain: Secondary | ICD-10-CM | POA: Diagnosis not present

## 2023-11-21 DIAGNOSIS — N281 Cyst of kidney, acquired: Secondary | ICD-10-CM | POA: Diagnosis not present

## 2023-11-21 DIAGNOSIS — H409 Unspecified glaucoma: Secondary | ICD-10-CM | POA: Diagnosis present

## 2023-11-21 DIAGNOSIS — R55 Syncope and collapse: Secondary | ICD-10-CM | POA: Diagnosis not present

## 2023-11-21 DIAGNOSIS — N3289 Other specified disorders of bladder: Secondary | ICD-10-CM | POA: Diagnosis present

## 2023-11-21 DIAGNOSIS — E785 Hyperlipidemia, unspecified: Secondary | ICD-10-CM | POA: Diagnosis present

## 2023-11-21 DIAGNOSIS — N184 Chronic kidney disease, stage 4 (severe): Secondary | ICD-10-CM | POA: Diagnosis present

## 2023-11-21 DIAGNOSIS — J9601 Acute respiratory failure with hypoxia: Secondary | ICD-10-CM | POA: Diagnosis present

## 2023-11-21 DIAGNOSIS — E872 Acidosis, unspecified: Secondary | ICD-10-CM | POA: Diagnosis present

## 2023-11-21 DIAGNOSIS — R911 Solitary pulmonary nodule: Secondary | ICD-10-CM | POA: Diagnosis present

## 2023-11-21 DIAGNOSIS — I5082 Biventricular heart failure: Secondary | ICD-10-CM | POA: Diagnosis present

## 2023-11-21 DIAGNOSIS — I21A1 Myocardial infarction type 2: Secondary | ICD-10-CM | POA: Diagnosis not present

## 2023-11-21 DIAGNOSIS — R188 Other ascites: Secondary | ICD-10-CM | POA: Diagnosis not present

## 2023-11-21 DIAGNOSIS — E1122 Type 2 diabetes mellitus with diabetic chronic kidney disease: Secondary | ICD-10-CM | POA: Diagnosis present

## 2023-11-21 DIAGNOSIS — Y848 Other medical procedures as the cause of abnormal reaction of the patient, or of later complication, without mention of misadventure at the time of the procedure: Secondary | ICD-10-CM | POA: Diagnosis not present

## 2023-11-21 DIAGNOSIS — H44519 Absolute glaucoma, unspecified eye: Secondary | ICD-10-CM | POA: Diagnosis not present

## 2023-11-21 DIAGNOSIS — I10 Essential (primary) hypertension: Secondary | ICD-10-CM | POA: Diagnosis not present

## 2023-11-21 DIAGNOSIS — N179 Acute kidney failure, unspecified: Secondary | ICD-10-CM | POA: Diagnosis not present

## 2023-11-21 DIAGNOSIS — E1129 Type 2 diabetes mellitus with other diabetic kidney complication: Secondary | ICD-10-CM | POA: Diagnosis not present

## 2023-11-21 DIAGNOSIS — D61818 Other pancytopenia: Secondary | ICD-10-CM | POA: Diagnosis present

## 2023-11-21 DIAGNOSIS — I214 Non-ST elevation (NSTEMI) myocardial infarction: Secondary | ICD-10-CM | POA: Diagnosis not present

## 2023-11-21 DIAGNOSIS — Z1152 Encounter for screening for COVID-19: Secondary | ICD-10-CM | POA: Diagnosis not present

## 2023-11-21 DIAGNOSIS — F03C18 Unspecified dementia, severe, with other behavioral disturbance: Secondary | ICD-10-CM | POA: Diagnosis not present

## 2023-11-21 DIAGNOSIS — I509 Heart failure, unspecified: Secondary | ICD-10-CM

## 2023-11-21 DIAGNOSIS — I5022 Chronic systolic (congestive) heart failure: Secondary | ICD-10-CM | POA: Diagnosis not present

## 2023-11-21 DIAGNOSIS — E6609 Other obesity due to excess calories: Secondary | ICD-10-CM | POA: Diagnosis present

## 2023-11-21 DIAGNOSIS — I469 Cardiac arrest, cause unspecified: Secondary | ICD-10-CM | POA: Diagnosis not present

## 2023-11-21 DIAGNOSIS — R41841 Cognitive communication deficit: Secondary | ICD-10-CM | POA: Diagnosis not present

## 2023-11-21 DIAGNOSIS — M7989 Other specified soft tissue disorders: Secondary | ICD-10-CM

## 2023-11-21 DIAGNOSIS — I5084 End stage heart failure: Secondary | ICD-10-CM | POA: Diagnosis not present

## 2023-11-21 DIAGNOSIS — F0394 Unspecified dementia, unspecified severity, with anxiety: Secondary | ICD-10-CM | POA: Diagnosis present

## 2023-11-21 DIAGNOSIS — R531 Weakness: Secondary | ICD-10-CM | POA: Diagnosis not present

## 2023-11-21 DIAGNOSIS — D509 Iron deficiency anemia, unspecified: Secondary | ICD-10-CM | POA: Diagnosis present

## 2023-11-21 DIAGNOSIS — R451 Restlessness and agitation: Secondary | ICD-10-CM | POA: Diagnosis not present

## 2023-11-21 DIAGNOSIS — I5043 Acute on chronic combined systolic (congestive) and diastolic (congestive) heart failure: Secondary | ICD-10-CM | POA: Diagnosis present

## 2023-11-21 DIAGNOSIS — E1169 Type 2 diabetes mellitus with other specified complication: Secondary | ICD-10-CM | POA: Diagnosis present

## 2023-11-21 DIAGNOSIS — I130 Hypertensive heart and chronic kidney disease with heart failure and stage 1 through stage 4 chronic kidney disease, or unspecified chronic kidney disease: Secondary | ICD-10-CM | POA: Diagnosis present

## 2023-11-21 DIAGNOSIS — Z7401 Bed confinement status: Secondary | ICD-10-CM | POA: Diagnosis not present

## 2023-11-21 DIAGNOSIS — Z452 Encounter for adjustment and management of vascular access device: Secondary | ICD-10-CM | POA: Diagnosis not present

## 2023-11-21 DIAGNOSIS — R410 Disorientation, unspecified: Secondary | ICD-10-CM | POA: Diagnosis present

## 2023-11-21 DIAGNOSIS — I771 Stricture of artery: Secondary | ICD-10-CM | POA: Diagnosis not present

## 2023-11-21 DIAGNOSIS — Z515 Encounter for palliative care: Secondary | ICD-10-CM | POA: Diagnosis not present

## 2023-11-21 DIAGNOSIS — I87323 Chronic venous hypertension (idiopathic) with inflammation of bilateral lower extremity: Secondary | ICD-10-CM | POA: Diagnosis not present

## 2023-11-21 DIAGNOSIS — Z66 Do not resuscitate: Secondary | ICD-10-CM | POA: Diagnosis not present

## 2023-11-21 DIAGNOSIS — R7989 Other specified abnormal findings of blood chemistry: Secondary | ICD-10-CM | POA: Diagnosis not present

## 2023-11-21 DIAGNOSIS — R4182 Altered mental status, unspecified: Secondary | ICD-10-CM | POA: Diagnosis not present

## 2023-11-21 DIAGNOSIS — L97218 Non-pressure chronic ulcer of right calf with other specified severity: Secondary | ICD-10-CM | POA: Diagnosis not present

## 2023-11-21 DIAGNOSIS — F03911 Unspecified dementia, unspecified severity, with agitation: Secondary | ICD-10-CM | POA: Diagnosis present

## 2023-11-21 LAB — URINALYSIS, W/ REFLEX TO CULTURE (INFECTION SUSPECTED)
Bacteria, UA: NONE SEEN
Bilirubin Urine: NEGATIVE
Glucose, UA: >=500 mg/dL — AB
Hgb urine dipstick: NEGATIVE
Ketones, ur: NEGATIVE mg/dL
Leukocytes,Ua: NEGATIVE
Nitrite: NEGATIVE
Protein, ur: 100 mg/dL — AB
Specific Gravity, Urine: 1.009 (ref 1.005–1.030)
pH: 6 (ref 5.0–8.0)

## 2023-11-21 LAB — RETICULOCYTES
Immature Retic Fract: 21.9 % — ABNORMAL HIGH (ref 2.3–15.9)
RBC.: 5.56 MIL/uL (ref 4.22–5.81)
Retic Count, Absolute: 111.8 10*3/uL (ref 19.0–186.0)
Retic Ct Pct: 2 % (ref 0.4–3.1)

## 2023-11-21 LAB — IRON AND TIBC
Iron: 69 ug/dL (ref 45–182)
Saturation Ratios: 19 % (ref 17.9–39.5)
TIBC: 372 ug/dL (ref 250–450)
UIBC: 303 ug/dL

## 2023-11-21 LAB — COMPREHENSIVE METABOLIC PANEL
ALT: 21 U/L (ref 0–44)
AST: 29 U/L (ref 15–41)
Albumin: 3.4 g/dL — ABNORMAL LOW (ref 3.5–5.0)
Alkaline Phosphatase: 56 U/L (ref 38–126)
Anion gap: 8 (ref 5–15)
BUN: 32 mg/dL — ABNORMAL HIGH (ref 8–23)
CO2: 25 mmol/L (ref 22–32)
Calcium: 8.8 mg/dL — ABNORMAL LOW (ref 8.9–10.3)
Chloride: 109 mmol/L (ref 98–111)
Creatinine, Ser: 2.37 mg/dL — ABNORMAL HIGH (ref 0.61–1.24)
GFR, Estimated: 26 mL/min — ABNORMAL LOW (ref >60)
Glucose, Bld: 106 mg/dL — ABNORMAL HIGH (ref 70–99)
Potassium: 4.3 mmol/L (ref 3.5–5.1)
Sodium: 142 mmol/L (ref 135–145)
Total Bilirubin: 0.3 mg/dL (ref <1.2)
Total Protein: 6.5 g/dL (ref 6.5–8.1)

## 2023-11-21 LAB — RESP PANEL BY RT-PCR (RSV, FLU A&B, COVID)  RVPGX2
Influenza A by PCR: NEGATIVE
Influenza B by PCR: NEGATIVE
Resp Syncytial Virus by PCR: NEGATIVE
SARS Coronavirus 2 by RT PCR: NEGATIVE

## 2023-11-21 LAB — TECHNOLOGIST SMEAR REVIEW
Clinical Information: ELEVATED
Plt Morphology: NORMAL

## 2023-11-21 LAB — TROPONIN I (HIGH SENSITIVITY)
Troponin I (High Sensitivity): 131 ng/L (ref <18)
Troponin I (High Sensitivity): 76 ng/L — ABNORMAL HIGH (ref <18)

## 2023-11-21 LAB — BRAIN NATRIURETIC PEPTIDE: B Natriuretic Peptide: 669.8 pg/mL — ABNORMAL HIGH (ref 0.0–100.0)

## 2023-11-21 LAB — PROTIME-INR
INR: 1 (ref 0.8–1.2)
Prothrombin Time: 13.3 s (ref 11.4–15.2)

## 2023-11-21 LAB — GLUCOSE, CAPILLARY
Glucose-Capillary: 100 mg/dL — ABNORMAL HIGH (ref 70–99)
Glucose-Capillary: 105 mg/dL — ABNORMAL HIGH (ref 70–99)

## 2023-11-21 LAB — VITAMIN B12: Vitamin B-12: 171 pg/mL — ABNORMAL LOW (ref 180–914)

## 2023-11-21 LAB — FOLATE: Folate: 16.5 ng/mL (ref >5.9)

## 2023-11-21 LAB — D-DIMER, QUANTITATIVE: D-Dimer, Quant: 1.97 ug{FEU}/mL — ABNORMAL HIGH (ref 0.00–0.50)

## 2023-11-21 LAB — CBG MONITORING, ED
Glucose-Capillary: 85 mg/dL (ref 70–99)
Glucose-Capillary: 117 mg/dL — ABNORMAL HIGH (ref 70–99)

## 2023-11-21 LAB — FERRITIN: Ferritin: 38 ng/mL (ref 24–336)

## 2023-11-21 MED ORDER — ASPIRIN 81 MG PO TBEC
81.0000 mg | DELAYED_RELEASE_TABLET | Freq: Every day | ORAL | Status: DC
  Administered 2023-11-21 – 2023-11-26 (×6): 81 mg via ORAL
  Filled 2023-11-21 (×6): qty 1

## 2023-11-21 MED ORDER — TRANDOLAPRIL 1 MG PO TABS
4.0000 mg | ORAL_TABLET | Freq: Every day | ORAL | Status: DC
  Filled 2023-11-21: qty 4

## 2023-11-21 MED ORDER — FUROSEMIDE 10 MG/ML IJ SOLN
40.0000 mg | Freq: Two times a day (BID) | INTRAMUSCULAR | Status: DC
  Administered 2023-11-21 – 2023-11-23 (×5): 40 mg via INTRAVENOUS
  Filled 2023-11-21 (×5): qty 4

## 2023-11-21 MED ORDER — METOPROLOL SUCCINATE ER 100 MG PO TB24
100.0000 mg | ORAL_TABLET | Freq: Every day | ORAL | Status: DC
  Administered 2023-11-21 – 2023-11-22 (×2): 100 mg via ORAL
  Filled 2023-11-21: qty 1
  Filled 2023-11-21: qty 4

## 2023-11-21 MED ORDER — ACETAMINOPHEN 650 MG RE SUPP: 650.0000 mg | Freq: Four times a day (QID) | RECTAL | Status: DC | PRN

## 2023-11-21 MED ORDER — INSULIN ASPART 100 UNIT/ML IJ SOLN: 0.0000 [IU] | Freq: Every day | INTRAMUSCULAR | Status: DC

## 2023-11-21 MED ORDER — HEPARIN SODIUM (PORCINE) 5000 UNIT/ML IJ SOLN
5000.0000 [IU] | Freq: Three times a day (TID) | INTRAMUSCULAR | Status: DC
  Administered 2023-11-21 – 2023-11-26 (×14): 5000 [IU] via SUBCUTANEOUS
  Filled 2023-11-21 (×18): qty 1

## 2023-11-21 MED ORDER — TAFAMIDIS MEGLUMINE (CARDIAC) 20 MG PO CAPS: 80.0000 mg | ORAL_CAPSULE | Freq: Every day | ORAL | Status: DC

## 2023-11-21 MED ORDER — AMLODIPINE BESYLATE 2.5 MG PO TABS
2.5000 mg | ORAL_TABLET | Freq: Every day | ORAL | Status: DC
  Administered 2023-11-21 – 2023-11-22 (×2): 2.5 mg via ORAL
  Filled 2023-11-21 (×2): qty 1

## 2023-11-21 MED ORDER — SODIUM CHLORIDE 0.9% FLUSH
3.0000 mL | Freq: Two times a day (BID) | INTRAVENOUS | Status: DC
  Administered 2023-11-21 – 2023-11-26 (×10): 3 mL via INTRAVENOUS

## 2023-11-21 MED ORDER — INSULIN ASPART 100 UNIT/ML IJ SOLN
0.0000 [IU] | Freq: Three times a day (TID) | INTRAMUSCULAR | Status: DC
  Administered 2023-11-25: 2 [IU] via SUBCUTANEOUS

## 2023-11-21 MED ORDER — ACETAMINOPHEN 325 MG PO TABS
650.0000 mg | ORAL_TABLET | Freq: Four times a day (QID) | ORAL | Status: DC | PRN
  Administered 2023-11-21 – 2023-11-26 (×4): 650 mg via ORAL
  Filled 2023-11-21 (×4): qty 2

## 2023-11-21 NOTE — H&P (Signed)
History and Physical    Steven Myers RUE:[id number] DOB: 03-07-1935 DOA: 11/20/2023  PCP: Renford Dills, MD   Patient coming from: Home   Chief Complaint: SOB, swelling, AMS   HPI: Steven Myers is a 88 y.o. male with medical history significant for hypertension, type 2 diabetes mellitus, CKD 3B, cardiac amyloidosis, and chronic HFmrEF who presents with shortness of breath, swelling, and altered mental status.  Patient reports insidiously worsening dyspnea over the past few days without any significant cough, no fever or chills, and no chest pain.  He also reports bilateral lower extremity swelling which has been worse on the left.  He has mild aching discomfort in the left lower extremity as well.  Patient notes that he was experiencing visual hallucinations recently, but that seems to have resolved.  He denies any auditory hallucinations.  ED Course: Upon arrival to the ED, patient is found to be afebrile and saturating well on room air while at rest with mild tachypnea and elevated blood pressure.  Labs are most notable for creatinine 2.37, troponin 131, BNP 670, and D-dimer 1.97.  Head CT is negative for acute findings.  CT of abdomen pelvis is most notable for pulmonary nodule and urinary bladder wall thickening.   Oxygen saturations dropped to the low 80s with minimal exertion and supplemental oxygen via nasal cannula was administered.  Review of Systems:  All other systems reviewed and apart from HPI, are negative.  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 05/2018 Echo: EF 60-65%, no rwma, GrI DD. Mildly dil Ao root. Mild BAE; b. 05/2022 Echo: EF 45-50%, inferior basal HK. Sev LVH, GrI DD, mildly enlarged RV w/ mildly reduced fxn. Trivm MR. AoV sclerosis. Asc Ao 39mm.   Chest pain    a. 03/2007 MV: No isch/infarct; b. 04/2010 Lexi MV: EF 62%. No isch/infarct.   CKD (chronic kidney disease) stage 3, GFR 30-59 ml/min (HCC)    DM (diabetes mellitus) type II controlled with renal manifestation (HCC)    Not on insulin   Essential hypertension    Glaucoma    Optho: Dr. Cathey Endow   History of colonoscopy    Hyperlipidemia associated with type 2 diabetes mellitus (HCC)    Intolerant to statins --> because of elevated LFTs.   Obesity due to excess calories without serious comorbidity    BMI ~32 - mostly truncal obesity (mesomorphic)   Osteoarthritis of hip    Right > Left -- limits walking   RBBB (right bundle branch block)    Venous stasis dermatitis of both lower extremities - with edema    L leg worse than R 2/2 prior injury.; controlled somewhat with support hose    Past Surgical History:  Procedure Laterality Date   NM MYOVIEW LTD  04/2010   LEXISCAN: EF 62%. NO ISCHEMIA OR INFARCTION. LOW RISK. No RWMA     Social History:   reports that he quit smoking about 45 years ago. His smoking use included cigarettes. He has never used smokeless tobacco. He reports that he does not  currently use alcohol. He reports that he does not use drugs.  No Known Allergies  Family History  Problem Relation Age of Onset   Diabetes Mother    Heart disease Father 39   Colon cancer Neg Hx      Prior to Admission medications   Medication Sig Start Date End Date Taking? Authorizing Provider  amLODipine (NORVASC) 2.5 MG tablet Take 1 tablet (2.5 mg total) by mouth daily. 10/08/23 01/06/24  Sabharwal, Eliezer Lofts, DO  artificial tears (LACRILUBE) OINT ophthalmic ointment Place 1 Application into both eyes See admin instructions. Place as  directed into both eyes as directed at bedtime- 5 minutes after Travoprost    [provider]  Artificial Tears ophthalmic solution Place 1 drop into both eyes 3 (three) times daily.    [provider]  aspirin 81 MG tablet Take 81 mg by mouth daily.      [provider]  brimonidine-timolol (COMBIGAN) 0.2-0.5 % ophthalmic solution Place 1 drop into both eyes in the morning and at bedtime.    [provider]  empagliflozin (JARDIANCE) 10 MG TABS tablet Take 1 tablet (10 mg total) by mouth daily before breakfast. 08/11/23   [name] T, PA-C  ezetimibe (ZETIA) 10 MG tablet Take 10 mg by mouth daily. 03/21/18   [provider]  metoprolol succinate (TOPROL-XL) 100 MG 24 hr tablet take 1 tablet by mouth once daily 05/28/12   Anders Simmonds, PA-C  RHOPRESSA 0.02 % SOLN Place 1 drop into both eyes at bedtime. 02/02/23   [provider]  Tafamidis Meglumine, Cardiac, (VYNDAQEL) 20 MG CAPS Take 4 capsules (80 mg total) by mouth daily. 10/18/23   Sabharwal, Aditya, DO  torsemide (DEMADEX) 20 MG tablet Take 1 tablet (20 mg total) by mouth daily. 08/11/23 11/09/23  [name] T, PA-C  trandolapril (MAVIK) 4 MG tablet Take 4 mg by mouth daily.    [provider]  Travoprost, BAK Free, (TRAVATAN Z) 0.004 % SOLN ophthalmic solution Place 1 drop into both eyes at bedtime.    [provider]    Physical Exam: Vitals:   11/21/23 0255 11/21/23 0300 11/21/23 0315 11/21/23 0358  BP:   (!) 145/101   Pulse:   76 73  Resp:   17 (!) 25  Temp: 97.7 F (36.5 C)   97.6 F (36.4 C)  TempSrc: Oral   Oral  SpO2:  99% 100% 100%    Constitutional: NAD, calm  Eyes: PERTLA, lids and conjunctivae normal ENMT: Mucous membranes are moist. Posterior pharynx clear of any exudate or lesions.   Neck: supple, no masses  Respiratory: Dyspnea with speech. No wheezing. No rhonchi.  Cardiovascular: S1 & S2 heard, regular rate and rhythm. L>R pretibial  pitting edema. Abdomen: no tenderness, soft. Bowel sounds active.  Musculoskeletal: no clubbing / cyanosis. No joint deformity upper and lower extremities.   Skin: no significant rashes, lesions, ulcers. Warm, dry, well-perfused. Neurologic: CN 2-12 grossly intact. Moving all extremities. Alert and oriented to person, place, and situation.  Psychiatric: Pleasant. Cooperative.    Labs and Imaging on Admission: I have personally reviewed following labs and imaging studies  CBC: Recent Labs  Lab 11/20/23 2319  WBC 3.5*  NEUTROABS 2.5  HGB 10.7*  HCT 34.1*  MCV 94.2  PLT 124*   Basic Metabolic Panel: Recent Labs  Lab 11/21/23 0012  NA 142  K 4.3  CL 109  CO2 25  GLUCOSE 106*  BUN 32*  CREATININE 2.37*  CALCIUM 8.8*   GFR: CrCl cannot be calculated (Unknown ideal weight.). Liver Function Tests: Recent Labs  Lab 11/21/23 0012  AST 29  ALT 21  ALKPHOS 56  BILITOT 0.3  PROT 6.5  ALBUMIN 3.4*   No results for input(s): "LIPASE", "AMYLASE" in the last 168 hours. No results for input(s): "AMMONIA" in the last 168 hours. Coagulation Profile: No results for input(s): "INR", "PROTIME" in the last 168 hours. Cardiac Enzymes: No results for input(s): "CKTOTAL", "CKMB", "CKMBINDEX", "TROPONINI" in the last 168 hours. BNP (last 3 results) No results for input(s): "PROBNP" in the last 8760 hours. HbA1C: No results for input(s): "HGBA1C" in the last 72 hours. CBG: No results for input(s): "GLUCAP" in the last 168 hours. Lipid Profile: No results for input(s): "CHOL", "HDL", "LDLCALC", "TRIG", "CHOLHDL", "LDLDIRECT" in the last 72 hours. Thyroid Function Tests: No results for input(s): "TSH", "T4TOTAL", "FREET4", "T3FREE", "THYROIDAB" in the last 72 hours. Anemia Panel: No results for input(s): "VITAMINB12", "FOLATE", "FERRITIN", "TIBC", "IRON", "RETICCTPCT" in the last 72 hours. Urine analysis:    Component Value Date/Time   COLORURINE STRAW (A) 11/21/2023 0212    APPEARANCEUR CLEAR 11/21/2023 0212   LABSPEC 1.009 11/21/2023 0212   PHURINE 6.0 11/21/2023 0212   GLUCOSEU >=500 (A) 11/21/2023 0212   HGBUR NEGATIVE 11/21/2023 0212   BILIRUBINUR NEGATIVE 11/21/2023 0212   KETONESUR NEGATIVE 11/21/2023 0212   PROTEINUR 100 (A) 11/21/2023 0212   UROBILINOGEN 1.0 01/26/2012 2310   NITRITE NEGATIVE 11/21/2023 0212   LEUKOCYTESUR NEGATIVE 11/21/2023 0212   Sepsis Labs: @LABRCNTIP (procalcitonin:4,lacticidven:4) ) Recent Results (from the past 240 hours)  Resp panel by RT-PCR (RSV, Flu A&B, Covid) Anterior Nasal Swab     Status: None   Collection Time: 11/20/23 11:08 PM   Specimen: Anterior Nasal Swab  Result Value Ref Range Status   SARS Coronavirus 2 by RT PCR NEGATIVE NEGATIVE Final   Influenza A by PCR NEGATIVE NEGATIVE Final   Influenza B by PCR NEGATIVE NEGATIVE Final    Comment: (NOTE) The Xpert Xpress SARS-CoV-2/FLU/RSV plus assay is intended as an aid in the diagnosis of influenza from Nasopharyngeal swab specimens and should not be used as a sole basis for treatment. Nasal washings and aspirates are unacceptable for Xpert Xpress SARS-CoV-2/FLU/RSV testing.  Fact Sheet for Patients: BloggerCourse.com  Fact Sheet for Healthcare Providers: SeriousBroker.it  This test is not yet approved or cleared by the [place] FDA and has been authorized for detection and/or diagnosis of SARS-CoV-2 by FDA under an Emergency Use Authorization (EUA). This EUA will remain in effect (meaning this test can be used) for the duration of the COVID-19 declaration under Section 564(b)(1) of the Act, 21 U.S.C. section 360bbb-3(b)(1), unless the authorization is terminated or revoked.     Resp Syncytial Virus by PCR NEGATIVE NEGATIVE Final    Comment: (NOTE) Fact Sheet for Patients: BloggerCourse.com  Fact Sheet for Healthcare  Providers: SeriousBroker.it  This test is not yet approved or cleared by the [place] FDA and has been authorized for detection and/or diagnosis of SARS-CoV-2 by FDA under an Emergency Use Authorization (EUA). This EUA will remain in effect (meaning this test can be used) for the duration of the COVID-19 declaration under Section 564(b)(1) of the Act, 21 U.S.C. section 360bbb-3(b)(1), unless the authorization is terminated or revoked.  Performed at [facility], [address] [place], [place] [address]      Radiological Exams on Admission: CT ABDOMEN PELVIS WO CONTRAST Result Date: 11/21/2023 CLINICAL DATA:  Abdominal pain, acute, nonlocalized tachypnea, fluid on his abdomen. SOB. Urinary symptoms x 1 week. A/O x 4, issues with time line. 150/120, hr 80'sRBBB, 84% on RA, placed on NRB, now 100% 15 liters EXAM: CT ABDOMEN AND PELVIS WITHOUT CONTRAST TECHNIQUE: Multidetector CT imaging of the abdomen and pelvis was performed following the standard protocol without IV contrast. RADIATION DOSE REDUCTION: This exam was performed according to the departmental dose-optimization program which includes automated exposure control, adjustment of the mA and/or kV according to patient size and/or use of iterative reconstruction technique. COMPARISON:  None Available. FINDINGS: Lower chest: A 0.9 x 1 cm right middle lobe subpleural nodule. Cardiomegaly. Hepatobiliary: No focal consolidation. No pulmonary nodule. No pulmonary mass. No pleural effusion. No pneumothorax. Pancreas: No focal lesion. Normal pancreatic contour. No surrounding inflammatory changes. No main pancreatic ductal dilatation. Spleen: Normal in size without focal abnormality. Adrenals/Urinary Tract: No adrenal nodule bilaterally. No nephrolithiasis and no hydronephrosis. Fluid density lesions of the kidneys likely represent simple renal cysts. Simple renal cysts, in the absence of clinically indicated  signs/symptoms, require no independent follow-up. No ureterolithiasis or hydroureter. Urinary bladder wall thickening. Stomach/Bowel: Stomach is within normal limits. No evidence of bowel wall thickening or dilatation. Appendix appears normal. Vascular/Lymphatic: No abdominal aorta or iliac aneurysm. Moderate atherosclerotic plaque of the aorta and its branches. No abdominal, pelvic, or inguinal lymphadenopathy. Reproductive: Prostate is unremarkable. Other: Presacral soft tissue density and fat stranding. No intraperitoneal free fluid. No intraperitoneal free gas. No organized fluid collection. Musculoskeletal: No abdominal wall hernia or abnormality.  Diastasis rectus. No suspicious lytic or blastic osseous lesions. No acute displaced fracture. IMPRESSION: 1. Urinary bladder wall thickening. Correlate with urinalysis for infection. Underlying malignancy not excluded. 2. Indeterminate presacral soft tissue density and fat stranding. 3. A 0.9 x 1 cm right middle lobe subpleural nodule. Consider one of the following in 3 months for both low-risk and high-risk individuals: (a) repeat chest CT, (b) follow-up PET-CT, or (c) tissue sampling. This recommendation follows the consensus statement: Guidelines for Management of Incidental Pulmonary Nodules Detected on CT Images: From the Fleischner Society 2017; Radiology 2017; 284:228-243. 4.  Aortic Atherosclerosis (ICD10-I70.0). Electronically Signed   By: Tish Frederickson M.D.   On: 11/21/2023 02:59   CT Head Wo Contrast Result Date: 11/21/2023 CLINICAL DATA:  Altered mental status EXAM: CT HEAD WITHOUT CONTRAST TECHNIQUE: Contiguous axial images were obtained from the base of the skull through the vertex without intravenous contrast. RADIATION DOSE REDUCTION: This exam was performed according to the departmental dose-optimization program which includes automated exposure control, adjustment of the mA and/or kV according to patient size and/or use of iterative  reconstruction technique. COMPARISON:  11/14/2022 FINDINGS: Brain: No evidence of acute infarction, hemorrhage, mass, mass effect, or midline shift. No hydrocephalus or extra-axial fluid collection. Periventricular white matter changes, likely the sequela of chronic small vessel ischemic disease. Age related cerebral atrophy. Vascular: No hyperdense vessel. Skull: Negative for fracture or focal lesion. Sinuses/Orbits: Mucosal thickening in the ethmoid air cells. No acute finding in the orbits. Other: The mastoid air cells are well aerated. IMPRESSION: No acute intracranial process. Electronically Signed   By: Wiliam Ke M.D.   On: 11/21/2023 02:41   DG Chest Port 1 View Result Date: 11/20/2023 CLINICAL DATA:  Short of breath, altered level of consciousness EXAM: PORTABLE CHEST 1 VIEW COMPARISON:  11/14/2022 FINDINGS: Single frontal view of the chest demonstrates stable enlargement of the cardiac silhouette. No acute airspace disease, effusion, or pneumothorax. No acute bony  abnormality. IMPRESSION: Liver would stable chest, no acute process. Electronically Signed   By: Sharlet Salina M.D.   On: 11/20/2023 23:37    EKG: Independently reviewed. Sinus rhythm, RBBB.   Assessment/Plan   1. Acute on chronic HFmrEF; cardiac amyloidosis; acute hypoxic respiratory failure  - Diurese with IV Lasix, update echocardiogram, monitor weight and I/Os, continue beta-blocker and tafamidis, hold ACE-i for now given increased creatinine    2. Elevated D-dimer  - D-dimer is 1.97 in ED  - Pt reports LLE pain and swelling  - Check LLE venous doppler and V/Q scan    3. Elevated troponin  - Troponin is decreasing, there is no chest pain, and there is low suspicion for ACS  - Follow-up echo findings, V/Q scan    4. CKD 3B  - SCr is 2.37 on admission, up from 2.25 last month and 1.8-2 in August/September  - Renally-dose medications, hold ACE-i, and monitor closely while diuresing   5. Type II DM  - A1c was 6.5%  in November 2024  - Hold Jardiance in light of increased creatinine, check CBGs, and use low-intensity SSI if needed    6. Lung nodule  - Noted on CT in ED  - Discussed with patient, close outpatient follow-up recommended   7. Urinary bladder wall-thickening  - No sign of infection, urology follow-up after discharge recommended    8. Pancytopenia  - Mild leukopenia and thrombocytopenia noted previously; normocytic anemia appears new with no overt bleeding or jaundice   - Check reticulocytes, PT/INR, and request smear review     DVT prophylaxis: sq heparin  Code Status: Full  Level of Care: Level of care: Telemetry Medical Family Communication: Son updated by phone  Disposition Plan:  Patient is from: Home  Anticipated d/c is to: TBD Anticipated d/c date is: Possibly as early as 11/22/23 Patient currently: Pending improved oxygenation, stable renal function, echocardiogram, LLE venous doppers  Consults called: None Admission status: Observation     Briscoe Deutscher, MD [facility]  11/21/2023, 5:18 AM

## 2023-11-21 NOTE — ED Notes (Signed)
Pt transported to vascular, well appearing at time of transport.

## 2023-11-21 NOTE — ED Notes (Signed)
Oxygen taken off patient and patient saturation 100% , as soon as patient stood up he dropped to low 80's. Sat patient back in bed and applied 2L [place]. Pt O2 sat in now 100% with [place] 2L.

## 2023-11-21 NOTE — ED Notes (Signed)
Pt incontinent of urine, cleaned pt, changed linens, and brief applied

## 2023-11-21 NOTE — Care Management Obs Status (Signed)
MEDICARE OBSERVATION STATUS NOTIFICATION   Patient Details  Name: Depaul Jackovich MRN: [id number] Date of Birth: [birth year]   Medicare Observation Status Notification Given:  Yes    Lockie Pares, RN 11/21/2023, 12:21 PM

## 2023-11-21 NOTE — Progress Notes (Signed)
PROGRESS NOTE    Steven Myers  UXN:[id number] DOB: [birth year] DOA: 11/20/2023 PCP: Renford Dills, MD  88/M with chronic diastolic CHF, cardiac amyloidosis, CKD 4, type 2 diabetes mellitus presented to the ED with dyspnea on exertion, swelling and some hallucinations. -In the ED he was tachypneic, hypertensive, labs noted creatinine of 2.3, troponin 131, BNP 670, D-dimer 1.9, CT abdomen with pulmonary nodule and bladder wall thickening  Subjective: Feels better, breathing is improving  Assessment and Plan:  Acute on chronic combined CHF Cardiac amyloidosis;  Acute hypoxic respiratory failure  -Last echo 10/23 with a EF 45-to 50%, severe LVH with speckled pattern, grade 2 DD, severely reduced RV -Follow-up repeat echo -Continue IV Lasix today, metoprolol, tafamidis -Hold ACE inhibitor -Monitor urine output, BMP in a.m. -Restart Jardiance if kidney function improves   Elevated D-dimer  - D-dimer is 1.97 in ED  -Clinically do not suspect PE, follow-up Dopplers and echo    Elevated troponin  -Secondary to type II MI from demand ischemia, CHF -No ACS   CKD 4  -Baseline creatinine around 2, now 2.3 -Holding ACE inhibitor, monitor with diuresis   Type II DM  - A1c was 6.5% in November 2024  -Jardiance held   Lung nodule  - Noted on CT in ED  - Discussed with patient, close outpatient follow-up recommended    Urinary bladder wall-thickening  - No sign of infection, urology follow-up after discharge recommended, likely secondary to BPH   Pancytopenia  - Mild leukopenia and thrombocytopenia noted previously; normocytic anemia appears new with no overt bleeding or jaundice   - Check reticulocytes, PT/INR, and request smear review       DVT prophylaxis: sq heparin  Code Status: Full  Family Communication: Daughter side Disposition Plan: Home likely 48 hours  Consultants:    Procedures:   Antimicrobials:    Objective: Vitals:   11/21/23 0720 11/21/23 0745  11/21/23 0800 11/21/23 0936  BP: (!) 157/114 (!) 152/105 (!) 134/98 (!) 134/98  Pulse: 80 83 80 88  Resp: 16 20 20    Temp:  98.6 F (37 C)    TempSrc:  Oral    SpO2: 100% 100% 100%    No intake or output data in the 24 hours ending 11/21/23 1029 There were no vitals filed for this visit.  Examination:  General exam: Appears calm and comfortable, AAOx2, mild cognitive deficits Respiratory system: Clear to auscultation Cardiovascular system: S1 & S2 heard, RRR.  Abd: nondistended, soft and nontender.Normal bowel sounds heard. Extremities: 1+ edema Skin: No rashes Psychiatry: Flat affect    Data Reviewed:   CBC: Recent Labs  Lab 11/20/23 2319  WBC 3.5*  NEUTROABS 2.5  HGB 10.7*  HCT 34.1*  MCV 94.2  PLT 124*   Basic Metabolic Panel: Recent Labs  Lab 11/21/23 0012  NA 142  K 4.3  CL 109  CO2 25  GLUCOSE 106*  BUN 32*  CREATININE 2.37*  CALCIUM 8.8*   GFR: CrCl cannot be calculated (Unknown ideal weight.). Liver Function Tests: Recent Labs  Lab 11/21/23 0012  AST 29  ALT 21  ALKPHOS 56  BILITOT 0.3  PROT 6.5  ALBUMIN 3.4*   No results for input(s): "LIPASE", "AMYLASE" in the last 168 hours. No results for input(s): "AMMONIA" in the last 168 hours. Coagulation Profile: Recent Labs  Lab 11/21/23 0749  INR 1.0   Cardiac Enzymes: No results for input(s): "CKTOTAL", "CKMB", "CKMBINDEX", "TROPONINI" in the last 168 hours. BNP (last 3 results) No results  for input(s): "PROBNP" in the last 8760 hours. HbA1C: No results for input(s): "HGBA1C" in the last 72 hours. CBG: Recent Labs  Lab 11/21/23 0822  GLUCAP 85   Lipid Profile: No results for input(s): "CHOL", "HDL", "LDLCALC", "TRIG", "CHOLHDL", "LDLDIRECT" in the last 72 hours. Thyroid Function Tests: No results for input(s): "TSH", "T4TOTAL", "FREET4", "T3FREE", "THYROIDAB" in the last 72 hours. Anemia Panel: Recent Labs    11/21/23 0749  VITAMINB12 171*  RETICCTPCT 2.0   Urine  analysis:    Component Value Date/Time   COLORURINE STRAW (A) 11/21/2023 0212   APPEARANCEUR CLEAR 11/21/2023 0212   LABSPEC 1.009 11/21/2023 0212   PHURINE 6.0 11/21/2023 0212   GLUCOSEU >=500 (A) 11/21/2023 0212   HGBUR NEGATIVE 11/21/2023 0212   BILIRUBINUR NEGATIVE 11/21/2023 0212   KETONESUR NEGATIVE 11/21/2023 0212   PROTEINUR 100 (A) 11/21/2023 0212   UROBILINOGEN 1.0 01/26/2012 2310   NITRITE NEGATIVE 11/21/2023 0212   LEUKOCYTESUR NEGATIVE 11/21/2023 0212   Sepsis Labs: @LABRCNTIP (procalcitonin:4,lacticidven:4)  ) Recent Results (from the past 240 hours)  Resp panel by RT-PCR (RSV, Flu A&B, Covid) Anterior Nasal Swab     Status: None   Collection Time: 11/20/23 11:08 PM   Specimen: Anterior Nasal Swab  Result Value Ref Range Status   SARS Coronavirus 2 by RT PCR NEGATIVE NEGATIVE Final   Influenza A by PCR NEGATIVE NEGATIVE Final   Influenza B by PCR NEGATIVE NEGATIVE Final    Comment: (NOTE) The Xpert Xpress SARS-CoV-2/FLU/RSV plus assay is intended as an aid in the diagnosis of influenza from Nasopharyngeal swab specimens and should not be used as a sole basis for treatment. Nasal washings and aspirates are unacceptable for Xpert Xpress SARS-CoV-2/FLU/RSV testing.  Fact Sheet for Patients: BloggerCourse.com  Fact Sheet for Healthcare Providers: SeriousBroker.it  This test is not yet approved or cleared by the [place] FDA and has been authorized for detection and/or diagnosis of SARS-CoV-2 by FDA under an Emergency Use Authorization (EUA). This EUA will remain in effect (meaning this test can be used) for the duration of the COVID-19 declaration under Section 564(b)(1) of the Act, 21 U.S.C. section 360bbb-3(b)(1), unless the authorization is terminated or revoked.     Resp Syncytial Virus by PCR NEGATIVE NEGATIVE Final    Comment: (NOTE) Fact Sheet for  Patients: BloggerCourse.com  Fact Sheet for Healthcare Providers: SeriousBroker.it  This test is not yet approved or cleared by the [place] FDA and has been authorized for detection and/or diagnosis of SARS-CoV-2 by FDA under an Emergency Use Authorization (EUA). This EUA will remain in effect (meaning this test can be used) for the duration of the COVID-19 declaration under Section 564(b)(1) of the Act, 21 U.S.C. section 360bbb-3(b)(1), unless the authorization is terminated or revoked.  Performed at [facility], [address], [place], [place] [address]      Radiology Studies: CT ABDOMEN PELVIS WO CONTRAST Result Date: 11/21/2023 CLINICAL DATA:  Abdominal pain, acute, nonlocalized tachypnea, fluid on his abdomen. SOB. Urinary symptoms x 1 week. A/O x 4, issues with time line. 150/120, hr 80'sRBBB, 84% on RA, placed on NRB, now 100% 15 liters EXAM: CT ABDOMEN AND PELVIS WITHOUT CONTRAST TECHNIQUE: Multidetector CT imaging of the abdomen and pelvis was performed following the standard protocol without IV contrast. RADIATION DOSE REDUCTION: This exam was performed according to the departmental dose-optimization program which includes automated exposure control, adjustment of the mA and/or kV according to patient size and/or use of iterative reconstruction technique. COMPARISON:  None Available. FINDINGS: Lower chest: A 0.9 x 1 cm right middle lobe subpleural nodule. Cardiomegaly. Hepatobiliary: No focal consolidation. No pulmonary nodule. No pulmonary mass. No pleural effusion. No pneumothorax. Pancreas: No focal lesion. Normal pancreatic contour. No surrounding inflammatory changes. No main pancreatic ductal dilatation. Spleen: Normal in size without focal abnormality. Adrenals/Urinary Tract: No adrenal nodule bilaterally. No nephrolithiasis and no hydronephrosis. Fluid density lesions of the kidneys likely represent simple renal  cysts. Simple renal cysts, in the absence of clinically indicated signs/symptoms, require no independent follow-up. No ureterolithiasis or hydroureter. Urinary bladder wall thickening. Stomach/Bowel: Stomach is within normal limits. No evidence of bowel wall thickening or dilatation. Appendix appears normal. Vascular/Lymphatic: No abdominal aorta or iliac aneurysm. Moderate atherosclerotic plaque of the aorta and its branches. No abdominal, pelvic, or inguinal lymphadenopathy. Reproductive: Prostate is unremarkable. Other: Presacral soft tissue density and fat stranding. No intraperitoneal free fluid. No intraperitoneal free gas. No organized fluid collection. Musculoskeletal: No abdominal wall hernia or abnormality.  Diastasis rectus. No suspicious lytic or blastic osseous lesions. No acute displaced fracture. IMPRESSION: 1. Urinary bladder wall thickening. Correlate with urinalysis for infection. Underlying malignancy not excluded. 2. Indeterminate presacral soft tissue density and fat stranding. 3. A 0.9 x 1 cm right middle lobe subpleural nodule. Consider one of the following in 3 months for both low-risk and high-risk individuals: (a) repeat chest CT, (b) follow-up PET-CT, or (c) tissue sampling. This recommendation follows the consensus statement: Guidelines for Management of Incidental Pulmonary Nodules Detected on CT Images: From the Fleischner Society 2017; Radiology 2017; 284:228-243. 4.  Aortic Atherosclerosis (ICD10-I70.0). Electronically Signed   By: Tish Frederickson M.D.   On: 11/21/2023 02:59   CT Head Wo Contrast Result Date: 11/21/2023 CLINICAL DATA:  Altered mental status EXAM: CT HEAD WITHOUT CONTRAST TECHNIQUE: Contiguous axial images were obtained from the base of the skull through the vertex without intravenous contrast. RADIATION DOSE REDUCTION: This exam was performed according to the departmental dose-optimization program which includes automated exposure control, adjustment of the mA  and/or kV according to patient size and/or use of iterative reconstruction technique. COMPARISON:  11/14/2022 FINDINGS: Brain: No evidence of acute infarction, hemorrhage, mass, mass effect, or midline shift. No hydrocephalus or extra-axial fluid collection. Periventricular white matter changes, likely the sequela of chronic small vessel ischemic disease. Age related cerebral atrophy. Vascular: No hyperdense vessel. Skull: Negative for fracture or focal lesion. Sinuses/Orbits: Mucosal thickening in the ethmoid air cells. No acute finding in the orbits. Other: The mastoid air cells are well aerated. IMPRESSION: No acute intracranial process. Electronically Signed   By: Wiliam Ke M.D.   On: 11/21/2023 02:41   DG Chest Port 1 View Result Date: 11/20/2023 CLINICAL DATA:  Short of breath, altered level of consciousness EXAM: PORTABLE CHEST 1 VIEW COMPARISON:  11/14/2022 FINDINGS: Single frontal view of the chest demonstrates stable enlargement of the cardiac silhouette. No acute airspace disease, effusion, or pneumothorax. No acute bony abnormality. IMPRESSION: Liver would stable chest, no acute process. Electronically Signed   By: Sharlet Salina M.D.   On: 11/20/2023 23:37     Scheduled Meds:  amLODipine  2.5 mg Oral Daily   aspirin EC  81 mg Oral Daily   furosemide  40 mg Intravenous BID   heparin  5,000 Units Subcutaneous Q8H   insulin aspart  0-5 Units Subcutaneous QHS   insulin aspart  0-6 Units Subcutaneous TID WC   metoprolol succinate  100 mg Oral Daily   sodium chloride flush  3  mL Intravenous Q12H   Tafamidis Meglumine (Cardiac)  80 mg Oral Daily   Continuous Infusions:   LOS: 0 days    Time spent:    Zannie Cove, MD [facility]   11/21/2023, 10:29 AM

## 2023-11-21 NOTE — Care Management (Signed)
Attempted to give MOON notice , called wife [name], left message to return call

## 2023-11-21 NOTE — ED Notes (Signed)
CCMD called to state patient was off monitor. This RN immediately went to room, patient had shut door and closed curtain, climbed out of bed and removed oxygen, bp cuff, pulse ox, cardiac leads, clothes, gown and socks and had urinated himself. When asked why patient got out of bed he stated  "I'm going home". Informed patient of plan of care  Patient was cleaned up, placed in new gown and new linen with all monitor, oxygen and cords placed back on patient.

## 2023-11-21 NOTE — ED Notes (Signed)
Pt well appearing upon transport to floor.

## 2023-11-21 NOTE — ED Notes (Signed)
ED TO INPATIENT HANDOFF REPORT  ED Nurse Name and Phone #: Osvaldo Shipper RN [phone]  S Name/Age/Gender Steven Myers 88 y.o. male Room/Bed: 012C/012C  Code Status   Code Status: Full Code  Home/SNF/Other Home Patient oriented to: self, place, time, and situation Is this baseline? Yes   Triage Complete: Triage complete  Chief Complaint Elevated d-dimer [R79.89] CHF (congestive heart failure) (HCC) [I50.9]  Triage Note Per report by EMS, Called out for AMS by pt wife, tachypnea, fluid on his abdomen. SOB. Urinary symptoms x 1 week. A/O x 4, issues with time line. 150/120, hr 80'sRBBB, 84% on RA, placed on NRB, now 100% 15 liters. CBG 187. IV established in the left ac.    Allergies No Known Allergies  Level of Care/Admitting Diagnosis ED Disposition     ED Disposition  Admit   Condition  --   Comment  Hospital Area: [facility] [[id number]]  Level of Care: Telemetry Cardiac [[id number]]  May admit patient to [facility] or [facility] if equivalent level of care is available:: No  Covid Evaluation: Asymptomatic - no recent exposure (last 10 days) testing not required  Diagnosis: CHF (congestive heart failure) [facility]) [[id number]]  Admitting Physician: Zannie Cove [[id number]]  Attending Physician: Zannie Cove [[id number]]  Certification:: I certify this patient will need inpatient services for at least 2 midnights  Expected Medical Readiness: 11/23/2023          B Medical/Surgery History Past Medical History:  Diagnosis Date   Bilateral cataracts    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 05/2018 Echo: EF 60-65%, no rwma, GrI DD. Mildly dil Ao root. Mild BAE; b. 05/2022 Echo: EF 45-50%, inferior basal HK. Sev LVH, GrI DD, mildly enlarged RV w/ mildly reduced fxn. Trivm MR. AoV sclerosis. Asc Ao 39mm.   Chest pain    a. 03/2007 MV: No isch/infarct; b. 04/2010 Lexi MV: EF 62%. No isch/infarct.   CKD (chronic kidney disease) stage 3, GFR 30-59 ml/min (HCC)    DM (diabetes mellitus) type II controlled with renal manifestation (HCC)    Not on insulin   Essential hypertension    Glaucoma    Optho: Dr. Cathey Endow   History of colonoscopy    Hyperlipidemia associated with type 2 diabetes mellitus (HCC)    Intolerant to statins --> because of elevated LFTs.   Obesity due to excess calories without serious comorbidity    BMI ~32 - mostly truncal obesity (mesomorphic)   Osteoarthritis of hip    Right > Left -- limits walking   RBBB (right bundle branch block)    Venous stasis dermatitis of both lower extremities - with edema    L leg worse than R 2/2 prior injury.; controlled somewhat with support hose   Past Surgical History:  Procedure Laterality Date   NM MYOVIEW LTD  04/2010   LEXISCAN: EF 62%. NO ISCHEMIA OR INFARCTION. LOW RISK. No RWMA      A IV Location/Drains/Wounds Patient Lines/Drains/Airways Status     Active Line/Drains/Airways     Name Placement date Placement time Site Days   Peripheral IV 11/21/23  20 G Posterior;Right Forearm 11/21/23  0807  Forearm  less than 1   External Urinary Catheter 11/21/23  1050  --  less than 1            Intake/Output Last 24 hours No intake or output data in the 24 hours ending 11/21/23 1530  Labs/Imaging Results for orders placed or performed during the hospital encounter of 11/20/23 (from the past 48 hours)  Brain natriuretic peptide     Status: Abnormal   Collection Time: 11/20/23 11:00 PM  Result Value Ref Range   B Natriuretic Peptide 669.8 (H) 0.0 - 100.0 pg/mL    Comment: Performed at [facility]  Lab, [address], [place], [place] [address]  Troponin I (High Sensitivity)     Status: Abnormal   Collection Time: 11/20/23 11:00 PM  Result Value Ref Range   Troponin I (High Sensitivity) 131 (HH) <18 ng/L    Comment: ATTEMPTED TO CALL AT 0055 CRITICAL RESULT CALLED TO, READ BACK BY AND VERIFIED WITH Kerrie Pleasure RN [phone] M. ALAMANO (NOTE) Elevated high sensitivity troponin I (hsTnI) values and significant  changes across serial measurements may suggest ACS but many other  chronic and acute conditions are known to elevate hsTnI results.  Refer to the "Links" section for chest pain algorithms and additional  guidance. Performed at [facility] Lab, [address], [place], [place] [address]   Resp panel by RT-PCR (RSV, Flu A&B, Covid) Anterior Nasal Swab     Status: None   Collection Time: 11/20/23 11:08 PM   Specimen: Anterior Nasal Swab  Result Value Ref Range   SARS Coronavirus 2 by RT PCR NEGATIVE NEGATIVE   Influenza A by PCR NEGATIVE NEGATIVE   Influenza B by PCR NEGATIVE NEGATIVE    Comment: (NOTE) The Xpert Xpress SARS-CoV-2/FLU/RSV plus assay is intended as an aid in the diagnosis of influenza from Nasopharyngeal swab specimens and should not be used as a sole basis for treatment. Nasal washings and aspirates are unacceptable for Xpert Xpress SARS-CoV-2/FLU/RSV testing.  Fact Sheet for Patients: BloggerCourse.com  Fact Sheet for Healthcare Providers: SeriousBroker.it  This test is not yet approved or cleared by the [place] FDA and has been authorized for detection and/or diagnosis of SARS-CoV-2 by FDA under an Emergency Use Authorization (EUA). This EUA will remain in effect (meaning this test can be used) for the duration of the COVID-19 declaration under Section 564(b)(1) of the Act, 21 U.S.C. section 360bbb-3(b)(1), unless the authorization is terminated or revoked.     Resp Syncytial Virus by PCR  NEGATIVE NEGATIVE    Comment: (NOTE) Fact Sheet for Patients: BloggerCourse.com  Fact Sheet for Healthcare Providers: SeriousBroker.it  This test is not yet approved or cleared by the [place] FDA and has been authorized for detection and/or diagnosis of SARS-CoV-2 by FDA under an Emergency Use Authorization (EUA). This EUA will remain in effect (meaning this test can be used) for the duration of the COVID-19 declaration under Section 564(b)(1) of the Act, 21 U.S.C. section 360bbb-3(b)(1), unless the authorization is terminated or revoked.  Performed at [facility] Lab, [address] [place], [place] [address]   CBC with Differential/Platelet     Status: Abnormal   Collection Time: 11/20/23 11:19 PM  Result Value Ref Range   WBC 3.5 (L) 4.0 - 10.5 K/uL   RBC 3.62 (L) 4.22 - 5.81 MIL/uL   Hemoglobin 10.7 (L) 13.0 - 17.0 g/dL   HCT 27.2 (L) 53.6 -  52.0 %   MCV 94.2 80.0 - 100.0 fL   MCH 29.6 26.0 - 34.0 pg   MCHC 31.4 30.0 - 36.0 g/dL   RDW 63.8 (H) 75.6 - 43.3 %   Platelets 124 (L) 150 - 400 K/uL    Comment: REPEATED TO VERIFY   nRBC 0.0 0.0 - 0.2 %   Neutrophils Relative % 71 %   Neutro Abs 2.5 1.7 - 7.7 K/uL   Lymphocytes Relative 14 %   Lymphs Abs 0.5 (L) 0.7 - 4.0 K/uL   Monocytes Relative 11 %   Monocytes Absolute 0.4 0.1 - 1.0 K/uL   Eosinophils Relative 3 %   Eosinophils Absolute 0.1 0.0 - 0.5 K/uL   Basophils Relative 1 %   Basophils Absolute 0.0 0.0 - 0.1 K/uL   Immature Granulocytes 0 %   Abs Immature Granulocytes 0.01 0.00 - 0.07 K/uL    Comment: Performed at [facility] Lab, [address] [place], [place] [address]  Comprehensive metabolic panel     Status: Abnormal   Collection Time: 11/21/23 12:12 AM  Result Value Ref Range   Sodium 142 135 - 145 mmol/L   Potassium 4.3 3.5 - 5.1 mmol/L   Chloride 109 98 - 111 mmol/L   CO2 25 22 - 32 mmol/L   Glucose, Bld 106 (H) 70 - 99 mg/dL    Comment:  Glucose reference range applies only to samples taken after fasting for at least 8 hours.   BUN 32 (H) 8 - 23 mg/dL   Creatinine, Ser 8.41 (H) 0.61 - 1.24 mg/dL   Calcium 8.8 (L) 8.9 - 10.3 mg/dL   Total Protein 6.5 6.5 - 8.1 g/dL   Albumin 3.4 (L) 3.5 - 5.0 g/dL   AST 29 15 - 41 U/L   ALT 21 0 - 44 U/L   Alkaline Phosphatase 56 38 - 126 U/L   Total Bilirubin 0.3 <1.2 mg/dL   GFR, Estimated 26 (L) >60 mL/min    Comment: (NOTE) Calculated using the CKD-EPI Creatinine Equation (2021)    Anion gap 8 5 - 15    Comment: Performed at [facility] Lab, [address], [place], [place] [address]  Urinalysis, w/ Reflex to Culture (Infection Suspected) -Urine, Clean Catch     Status: Abnormal   Collection Time: 11/21/23  2:12 AM  Result Value Ref Range   Specimen Source URINE, CLEAN CATCH    Color, Urine STRAW (A) YELLOW   APPearance CLEAR CLEAR   Specific Gravity, Urine 1.009 1.005 - 1.030   pH 6.0 5.0 - 8.0   Glucose, UA >=500 (A) NEGATIVE mg/dL   Hgb urine dipstick NEGATIVE NEGATIVE   Bilirubin Urine NEGATIVE NEGATIVE   Ketones, ur NEGATIVE NEGATIVE mg/dL   Protein, ur 016 (A) NEGATIVE mg/dL   Nitrite NEGATIVE NEGATIVE   Leukocytes,Ua NEGATIVE NEGATIVE   RBC / HPF 0-5 0 - 5 RBC/hpf   WBC, UA 0-5 0 - 5 WBC/hpf    Comment:        Reflex urine culture not performed if WBC <=10, OR if Squamous epithelial cells >5. If Squamous epithelial cells >5 suggest recollection.    Bacteria, UA NONE SEEN NONE SEEN   Squamous Epithelial / HPF 0-5 0 - 5 /HPF    Comment: Performed at [facility] Lab, [address], [place], [place] [address]  Troponin I (High Sensitivity)     Status: Abnormal   Collection Time: 11/21/23  2:29 AM  Result Value Ref  Range   Troponin I (High Sensitivity) 76 (H) <18 ng/L    Comment: (NOTE) Elevated high sensitivity troponin I (hsTnI) values and significant  changes across serial measurements may suggest ACS but many other  chronic and acute conditions are  known to elevate hsTnI results.  Refer to the "Links" section for chest pain algorithms and additional  guidance. Performed at [facility] Lab, [address], [place], [place] [address]   D-dimer, quantitative     Status: Abnormal   Collection Time: 11/21/23  3:40 AM  Result Value Ref Range   D-Dimer, Quant 1.97 (H) 0.00 - 0.50 ug/mL-FEU    Comment: (NOTE) At the manufacturer cut-off value of 0.5 g/mL FEU, this assay has a negative predictive value of 95-100%.This assay is intended for use in conjunction with a clinical pretest probability (PTP) assessment model to exclude pulmonary embolism (PE) and deep venous thrombosis (DVT) in outpatients suspected of PE or DVT. Results should be correlated with clinical presentation. Performed at [facility] Lab, [address] [place], [place] [address]   Vitamin B12     Status: Abnormal   Collection Time: 11/21/23  7:49 AM  Result Value Ref Range   Vitamin B-12 171 (L) 180 - 914 pg/mL    Comment: (NOTE) This assay is not validated for testing neonatal or myeloproliferative syndrome specimens for Vitamin B12 levels. Performed at [facility] Lab, [address], [place], [place] [address]   Reticulocytes     Status: Abnormal   Collection Time: 11/21/23  7:49 AM  Result Value Ref Range   Retic Ct Pct 2.0 0.4 - 3.1 %   RBC. 5.56 4.22 - 5.81 MIL/uL   Retic Count, Absolute 111.8 19.0 - 186.0 K/uL   Immature Retic Fract 21.9 (H) 2.3 - 15.9 %    Comment: Performed at [facility] Lab, [address], [place], [place] [address]  Protime-INR     Status: None   Collection Time: 11/21/23  7:49 AM  Result Value Ref Range   Prothrombin Time 13.3 11.4 - 15.2 seconds   INR 1.0 0.8 - 1.2    Comment: (NOTE) INR goal varies based on device and disease states. Performed at [facility] Lab, [address] [place], [place] [address]   Technologist smear review     Status: None   Collection Time: 11/21/23  7:49 AM  Result Value  Ref Range   WBC MORPHOLOGY MORPHOLOGY UNREMARKABLE    RBC MORPHOLOGY MORPHOLOGY UNREMARKABLE    Plt Morphology Normal platelet morphology    Clinical Information pancytopenia, elevated D     Comment: dimer, normal bilirubin Performed at [facility] Lab, [address] [place], [place] [address]   CBG monitoring, ED     Status: None   Collection Time: 11/21/23  8:22 AM  Result Value Ref Range   Glucose-Capillary 85 70 - 99 mg/dL    Comment: Glucose reference range applies only to samples taken after fasting for at least 8 hours.  CBG monitoring, ED     Status: Abnormal   Collection Time: 11/21/23 12:38 PM  Result Value Ref Range   Glucose-Capillary 117 (H) 70 - 99 mg/dL    Comment: Glucose reference range applies only to samples taken after fasting for at least 8 hours.   VAS [place] LOWER EXTREMITY VENOUS (DVT) Result Date: 11/21/2023  Lower Venous DVT Study Patient Name:  Steven Myers  Date of Exam:   11/21/2023 Medical Rec #: [id number]  Accession #:    [id number] Date of Birth: [birth year]      Patient Gender: M Patient Age:   52 years Exam Location:  [facility] Procedure:      VAS [place] LOWER EXTREMITY VENOUS (DVT) Referring Phys: TIMOTHY OPYD --------------------------------------------------------------------------------  Indications: Swelling, and Edema.  Comparison Study: Previous study on 8.19.2024. Performing Technologist: Fernande Bras  Examination Guidelines: A complete evaluation includes B-mode imaging, spectral Doppler, color Doppler, and power Doppler as needed of all accessible portions of each vessel. Bilateral testing is considered an integral part of a complete examination. Limited examinations for reoccurring indications may be performed as noted. The reflux portion of the exam is performed with the patient in reverse Trendelenburg.  +-----+---------------+---------+-----------+----------+--------------+  RIGHTCompressibilityPhasicitySpontaneityPropertiesThrombus Aging +-----+---------------+---------+-----------+----------+--------------+ CFV  Full           Yes      Yes                                 +-----+---------------+---------+-----------+----------+--------------+ SFJ  Full           Yes      Yes                                 +-----+---------------+---------+-----------+----------+--------------+   +---------+---------------+---------+-----------+----------+--------------+ LEFT     CompressibilityPhasicitySpontaneityPropertiesThrombus Aging +---------+---------------+---------+-----------+----------+--------------+ CFV      Full           Yes      Yes                                 +---------+---------------+---------+-----------+----------+--------------+ SFJ      Full           Yes      Yes                                 +---------+---------------+---------+-----------+----------+--------------+ FV Prox  Full                                                        +---------+---------------+---------+-----------+----------+--------------+ FV Mid   Full                                                        +---------+---------------+---------+-----------+----------+--------------+ FV DistalFull                                                        +---------+---------------+---------+-----------+----------+--------------+ PFV      Full                                                        +---------+---------------+---------+-----------+----------+--------------+  POP      Full           Yes      Yes                                 +---------+---------------+---------+-----------+----------+--------------+ PTV      Full                                                        +---------+---------------+---------+-----------+----------+--------------+ PERO     Full                                                         +---------+---------------+---------+-----------+----------+--------------+     Summary: RIGHT: - No evidence of common femoral vein obstruction.   LEFT: - There is no evidence of deep vein thrombosis in the lower extremity.  - No cystic structure found in the popliteal fossa.  *See table(s) above for measurements and observations. Electronically signed by Heath Lark on 11/21/2023 at 12:33:42 PM.    Final    CT ABDOMEN PELVIS WO CONTRAST Result Date: 11/21/2023 CLINICAL DATA:  Abdominal pain, acute, nonlocalized tachypnea, fluid on his abdomen. SOB. Urinary symptoms x 1 week. A/O x 4, issues with time line. 150/120, hr 80'sRBBB, 84% on RA, placed on NRB, now 100% 15 liters EXAM: CT ABDOMEN AND PELVIS WITHOUT CONTRAST TECHNIQUE: Multidetector CT imaging of the abdomen and pelvis was performed following the standard protocol without IV contrast. RADIATION DOSE REDUCTION: This exam was performed according to the departmental dose-optimization program which includes automated exposure control, adjustment of the mA and/or kV according to patient size and/or use of iterative reconstruction technique. COMPARISON:  None Available. FINDINGS: Lower chest: A 0.9 x 1 cm right middle lobe subpleural nodule. Cardiomegaly. Hepatobiliary: No focal consolidation. No pulmonary nodule. No pulmonary mass. No pleural effusion. No pneumothorax. Pancreas: No focal lesion. Normal pancreatic contour. No surrounding inflammatory changes. No main pancreatic ductal dilatation. Spleen: Normal in size without focal abnormality. Adrenals/Urinary Tract: No adrenal nodule bilaterally. No nephrolithiasis and no hydronephrosis. Fluid density lesions of the kidneys likely represent simple renal cysts. Simple renal cysts, in the absence of clinically indicated signs/symptoms, require no independent follow-up. No ureterolithiasis or hydroureter. Urinary bladder wall thickening. Stomach/Bowel: Stomach is within normal limits. No evidence of  bowel wall thickening or dilatation. Appendix appears normal. Vascular/Lymphatic: No abdominal aorta or iliac aneurysm. Moderate atherosclerotic plaque of the aorta and its branches. No abdominal, pelvic, or inguinal lymphadenopathy. Reproductive: Prostate is unremarkable. Other: Presacral soft tissue density and fat stranding. No intraperitoneal free fluid. No intraperitoneal free gas. No organized fluid collection. Musculoskeletal: No abdominal wall hernia or abnormality.  Diastasis rectus. No suspicious lytic or blastic osseous lesions. No acute displaced fracture. IMPRESSION: 1. Urinary bladder wall thickening. Correlate with urinalysis for infection. Underlying malignancy not excluded. 2. Indeterminate presacral soft tissue density and fat stranding. 3. A 0.9 x 1 cm right middle lobe subpleural nodule. Consider one of the following in 3 months for both low-risk and high-risk individuals: (a) repeat chest CT, (b) follow-up PET-CT, or (c) tissue sampling. This recommendation  follows the consensus statement: Guidelines for Management of Incidental Pulmonary Nodules Detected on CT Images: From the Fleischner Society 2017; Radiology 2017; 284:228-243. 4.  Aortic Atherosclerosis (ICD10-I70.0). Electronically Signed   By: Tish Frederickson M.D.   On: 11/21/2023 02:59   CT Head Wo Contrast Result Date: 11/21/2023 CLINICAL DATA:  Altered mental status EXAM: CT HEAD WITHOUT CONTRAST TECHNIQUE: Contiguous axial images were obtained from the base of the skull through the vertex without intravenous contrast. RADIATION DOSE REDUCTION: This exam was performed according to the departmental dose-optimization program which includes automated exposure control, adjustment of the mA and/or kV according to patient size and/or use of iterative reconstruction technique. COMPARISON:  11/14/2022 FINDINGS: Brain: No evidence of acute infarction, hemorrhage, mass, mass effect, or midline shift. No hydrocephalus or extra-axial fluid  collection. Periventricular white matter changes, likely the sequela of chronic small vessel ischemic disease. Age related cerebral atrophy. Vascular: No hyperdense vessel. Skull: Negative for fracture or focal lesion. Sinuses/Orbits: Mucosal thickening in the ethmoid air cells. No acute finding in the orbits. Other: The mastoid air cells are well aerated. IMPRESSION: No acute intracranial process. Electronically Signed   By: Wiliam Ke M.D.   On: 11/21/2023 02:41   DG Chest Port 1 View Result Date: 11/20/2023 CLINICAL DATA:  Short of breath, altered level of consciousness EXAM: PORTABLE CHEST 1 VIEW COMPARISON:  11/14/2022 FINDINGS: Single frontal view of the chest demonstrates stable enlargement of the cardiac silhouette. No acute airspace disease, effusion, or pneumothorax. No acute bony abnormality. IMPRESSION: Liver would stable chest, no acute process. Electronically Signed   By: Sharlet Salina M.D.   On: 11/20/2023 23:37    Pending Labs Unresulted Labs (From admission, onward)     Start     Ordered   11/22/23 0500  Basic metabolic panel  Daily,   R      11/21/23 0517   11/22/23 0500  Magnesium  Tomorrow morning,   R        11/21/23 0517   11/22/23 0500  CBC with Differential/Platelet  Daily,   R      11/21/23 0517   11/21/23 0535  Folate  (Anemia Panel (PNL))  Once,   R        11/21/23 0534   11/21/23 0535  Iron and TIBC  (Anemia Panel (PNL))  Once,   R        11/21/23 0534   11/21/23 0535  Ferritin  (Anemia Panel (PNL))  Once,   R        11/21/23 0534            Vitals/Pain Today's Vitals   11/21/23 0936 11/21/23 1100 11/21/23 1137 11/21/23 1415  BP: (!) 134/98 (!) 143/95  (!) 142/104  Pulse: 88 68  70  Resp:  17  17  Temp:   97.6 F (36.4 C)   TempSrc:   Oral   SpO2:  97%  98%  PainSc:        Isolation Precautions No active isolations  Medications Medications  aspirin EC tablet 81 mg (81 mg Oral Given 11/21/23 0936)  amLODipine (NORVASC) tablet 2.5 mg (2.5  mg Oral Given 11/21/23 0936)  metoprolol succinate (TOPROL-XL) 24 hr tablet 100 mg (100 mg Oral Given 11/21/23 0936)  Tafamidis Meglumine (Cardiac) CAPS 80 mg (80 mg Oral Not Given 11/21/23 1000)  insulin aspart (novoLOG) injection 0-6 Units ( Subcutaneous Not Given 11/21/23 1242)  insulin aspart (novoLOG) injection 0-5 Units (has no administration in time range)  heparin injection 5,000 Units (5,000 Units Subcutaneous Given 11/21/23 1339)  furosemide (LASIX) injection 40 mg (40 mg Intravenous Given 11/21/23 0811)  sodium chloride flush (NS) 0.9 % injection 3 mL (3 mLs Intravenous Given 11/21/23 0940)  acetaminophen (TYLENOL) tablet 650 mg (has no administration in time range)    Or  acetaminophen (TYLENOL) suppository 650 mg (has no administration in time range)    Mobility walks with person assist     Focused Assessments Cardiac Assessment Handoff:  Cardiac Rhythm: Normal sinus rhythm, Bundle branch block No results found for: "CKTOTAL", "CKMB", "CKMBINDEX", "TROPONINI" Lab Results  Component Value Date   DDIMER 1.97 (H) 11/21/2023   Does the Patient currently have chest pain? No   , Neuro Assessment Handoff:  Swallow screen pass? Yes  Cardiac Rhythm: Normal sinus rhythm, Bundle branch block       Neuro Assessment: Exceptions to WDL Neuro Checks:      Has TPA been given? No If patient is a Neuro Trauma and patient is going to OR before floor call report to 4N Charge nurse: [phone] or [phone]   R Recommendations: See Admitting Provider Note  Report given to:   Additional Notes: A&Ox4 with intermittent confusion

## 2023-11-21 NOTE — Care Management (Signed)
Transition of Care [facility]) - Inpatient Brief Assessment   Patient Details  Name: Steven Myers MRN: [id number] Date of Birth: January 16, 1935  [facility] CM/SW Contact:    Lockie Pares, RN Phone Number: 11/21/2023, 12:35 PM   Clinical Narrative:  Spoke with wife via phone regarding discharge planning. Introduced self and role.Patient presented with AMS, urinary symptoms  He lives with her and has family to take care of him. One of them can transport him home when he is discharged. He does not currently use DME.   TOC will follow for any needs, recommendations or transitions of care.   Transition of Care Asessment: Insurance and Status: Insurance coverage has been reviewed Patient has primary care physician: Yes Home environment has been reviewed: Lives with wife Prior level of function:: Ind Prior/Current Home Services: No current home services Social Drivers of Health Review: SDOH reviewed no interventions necessary Readmission risk has been reviewed: Yes Transition of care needs: transition of care needs identified, TOC will continue to follow

## 2023-11-22 ENCOUNTER — Other Ambulatory Visit: Payer: Self-pay

## 2023-11-22 ENCOUNTER — Inpatient Hospital Stay (HOSPITAL_COMMUNITY): Payer: Medicare Other

## 2023-11-22 DIAGNOSIS — I5023 Acute on chronic systolic (congestive) heart failure: Secondary | ICD-10-CM | POA: Diagnosis not present

## 2023-11-22 DIAGNOSIS — I5043 Acute on chronic combined systolic (congestive) and diastolic (congestive) heart failure: Secondary | ICD-10-CM

## 2023-11-22 DIAGNOSIS — N1832 Chronic kidney disease, stage 3b: Secondary | ICD-10-CM | POA: Diagnosis not present

## 2023-11-22 LAB — ECHOCARDIOGRAM COMPLETE
Area-P 1/2: 3.42 cm2
Calc EF: 27.2 %
Est EF: <20
Height: 72 in
S' Lateral: 3.5 cm
Single Plane A2C EF: 30.3 %
Single Plane A4C EF: 25.6 %
Weight: 3082.91 [oz_av]

## 2023-11-22 LAB — COOXEMETRY PANEL
Carboxyhemoglobin: 1.7 % — ABNORMAL HIGH (ref 0.5–1.5)
Methemoglobin: <0.7 % (ref 0.0–1.5)
O2 Saturation: 66.3 %
Total hemoglobin: 17.2 g/dL — ABNORMAL HIGH (ref 12.0–16.0)

## 2023-11-22 LAB — GLUCOSE, CAPILLARY
Glucose-Capillary: 96 mg/dL (ref 70–99)
Glucose-Capillary: 94 mg/dL (ref 70–99)
Glucose-Capillary: 118 mg/dL — ABNORMAL HIGH (ref 70–99)
Glucose-Capillary: 139 mg/dL — ABNORMAL HIGH (ref 70–99)

## 2023-11-22 LAB — CBC WITH DIFFERENTIAL/PLATELET
Abs Immature Granulocytes: 0.02 10*3/uL (ref 0.00–0.07)
Basophils Absolute: 0 10*3/uL (ref 0.0–0.1)
Basophils Relative: 1 %
Eosinophils Absolute: 0.2 10*3/uL (ref 0.0–0.5)
Eosinophils Relative: 4 %
HCT: 51.9 % (ref 39.0–52.0)
Hemoglobin: 16.7 g/dL (ref 13.0–17.0)
Immature Granulocytes: 1 %
Lymphocytes Relative: 17 %
Lymphs Abs: 0.8 10*3/uL (ref 0.7–4.0)
MCH: 28.9 pg (ref 26.0–34.0)
MCHC: 32.2 g/dL (ref 30.0–36.0)
MCV: 89.8 fL (ref 80.0–100.0)
Monocytes Absolute: 0.6 10*3/uL (ref 0.1–1.0)
Monocytes Relative: 14 %
Neutro Abs: 2.8 10*3/uL (ref 1.7–7.7)
Neutrophils Relative %: 63 %
Platelets: 126 10*3/uL — ABNORMAL LOW (ref 150–400)
RBC: 5.78 MIL/uL (ref 4.22–5.81)
RDW: 15.4 % (ref 11.5–15.5)
WBC: 4.3 10*3/uL (ref 4.0–10.5)
nRBC: 0 % (ref 0.0–0.2)

## 2023-11-22 LAB — BASIC METABOLIC PANEL
Anion gap: 10 (ref 5–15)
BUN: 28 mg/dL — ABNORMAL HIGH (ref 8–23)
CO2: 26 mmol/L (ref 22–32)
Calcium: 8.7 mg/dL — ABNORMAL LOW (ref 8.9–10.3)
Chloride: 107 mmol/L (ref 98–111)
Creatinine, Ser: 1.95 mg/dL — ABNORMAL HIGH (ref 0.61–1.24)
GFR, Estimated: 33 mL/min — ABNORMAL LOW (ref >60)
Glucose, Bld: 90 mg/dL (ref 70–99)
Potassium: 3.8 mmol/L (ref 3.5–5.1)
Sodium: 143 mmol/L (ref 135–145)

## 2023-11-22 LAB — LACTIC ACID, PLASMA
Lactic Acid, Venous: 3.2 mmol/L (ref 0.5–1.9)
Lactic Acid, Venous: 2 mmol/L (ref 0.5–1.9)

## 2023-11-22 LAB — MAGNESIUM: Magnesium: 2.2 mg/dL (ref 1.7–2.4)

## 2023-11-22 MED ORDER — CHLORHEXIDINE GLUCONATE CLOTH 2 % EX PADS
6.0000 | MEDICATED_PAD | Freq: Every day | CUTANEOUS | Status: DC
  Administered 2023-11-22 – 2023-11-24 (×3): 6 via TOPICAL

## 2023-11-22 MED ORDER — SODIUM CHLORIDE 0.9% FLUSH
10.0000 mL | Freq: Two times a day (BID) | INTRAVENOUS | Status: DC
  Administered 2023-11-22: 20 mL
  Administered 2023-11-23: 10 mL

## 2023-11-22 MED ORDER — DORZOLAMIDE HCL-TIMOLOL MAL 2-0.5 % OP SOLN
1.0000 [drp] | Freq: Two times a day (BID) | OPHTHALMIC | Status: DC
  Administered 2023-11-22 – 2023-11-26 (×8): 1 [drp] via OPHTHALMIC
  Filled 2023-11-22: qty 10

## 2023-11-22 MED ORDER — SODIUM CHLORIDE 0.9% FLUSH: 10.0000 mL | INTRAVENOUS | Status: DC | PRN

## 2023-11-22 MED ORDER — METOPROLOL SUCCINATE ER 50 MG PO TB24: 50.0000 mg | ORAL_TABLET | Freq: Every day | ORAL | Status: DC

## 2023-11-22 MED ORDER — ISOSORB DINITRATE-HYDRALAZINE 20-37.5 MG PO TABS
0.5000 | ORAL_TABLET | Freq: Three times a day (TID) | ORAL | Status: DC
Start: 2023-11-23 — End: 2023-11-23
  Administered 2023-11-23: 0.5 via ORAL
  Filled 2023-11-22: qty 1

## 2023-11-22 MED ORDER — PERFLUTREN LIPID MICROSPHERE
1.0000 mL | INTRAVENOUS | Status: AC | PRN
  Administered 2023-11-22: 2 mL via INTRAVENOUS

## 2023-11-22 MED ORDER — MILRINONE LACTATE IN DEXTROSE 20-5 MG/100ML-% IV SOLN
0.2500 ug/kg/min | INTRAVENOUS | Status: DC
  Administered 2023-11-22 – 2023-11-23 (×2): 0.25 ug/kg/min via INTRAVENOUS
  Filled 2023-11-22 (×2): qty 100

## 2023-11-22 MED ORDER — ARTIFICIAL TEARS OPHTHALMIC OINT: TOPICAL_OINTMENT | Freq: Three times a day (TID) | OPHTHALMIC | Status: DC | PRN

## 2023-11-22 MED ORDER — LATANOPROST 0.005 % OP SOLN
1.0000 [drp] | Freq: Every day | OPHTHALMIC | Status: DC
  Administered 2023-11-22 – 2023-11-25 (×4): 1 [drp] via OPHTHALMIC
  Filled 2023-11-22: qty 2.5

## 2023-11-22 MED ORDER — POTASSIUM CHLORIDE CRYS ER 20 MEQ PO TBCR
40.0000 meq | EXTENDED_RELEASE_TABLET | Freq: Once | ORAL | Status: AC
  Administered 2023-11-22: 40 meq via ORAL
  Filled 2023-11-22: qty 2

## 2023-11-22 NOTE — Progress Notes (Signed)
Peripherally Inserted Central Catheter Placement  The IV Nurse has discussed with the patient and/or persons authorized to consent for the patient, the purpose of this procedure and the potential benefits and risks involved with this procedure.  The benefits include less needle sticks, lab draws from the catheter, and the patient may be discharged home with the catheter. Risks include, but not limited to, infection, bleeding, blood clot (thrombus formation), and puncture of an artery; nerve damage and irregular heartbeat and possibility to perform a PICC exchange if needed/ordered by physician.  Alternatives to this procedure were also discussed.  Bard Power PICC patient education guide, fact sheet on infection prevention and patient information card has been provided to patient /or left at bedside.    PICC Placement Documentation  PICC Double Lumen 11/22/23 Right Brachial 41 cm 0 cm (Active)  Indication for Insertion or Continuance of Line Vasoactive infusions 11/22/23 1736  Exposed Catheter (cm) 0 cm 11/22/23 1736  Site Assessment Clean, Dry, Intact;Clean;Dry 11/22/23 1736  Lumen #1 Status Flushed;Saline locked;Blood return noted 11/22/23 1736  Lumen #2 Status Flushed;Saline locked;Blood return noted 11/22/23 1736  Dressing Type Transparent;Securing device 11/22/23 1736  Dressing Status Antimicrobial disc in place;Clean, Dry, Intact 11/22/23 1736  Line Care Connections checked and tightened 11/22/23 1736  Line Adjustment (NICU/IV Team Only) Yes 11/22/23 1736  Dressing Intervention New dressing;Adhesive placed at insertion site (IV team only);Other (Comment) 11/22/23 1736  Dressing Change Due 11/29/23 11/22/23 1736    Patient's daughter, Ramin Fumero, signed PICC consent via telephone. Verified by 2 PICC RNs.   Annett Fabian 11/22/2023, 5:38 PM

## 2023-11-22 NOTE — Progress Notes (Signed)
Heart Failure Navigator Progress Note  Assessed for Heart & Vascular TOC clinic readiness.  Patient does not meet criteria due to Advanced Heart Failure Team patient of Dr. Sabharwal.   Navigator will sign off at this time.    Jaking Thayer, BSN, RN Heart Failure Nurse Navigator Secure Chat Only   

## 2023-11-22 NOTE — Progress Notes (Signed)
  Echocardiogram 2D Echocardiogram has been performed.  Janalyn Harder 11/22/2023, 10:03 AM

## 2023-11-22 NOTE — Progress Notes (Addendum)
PROGRESS NOTE    Steven Myers  ZOX:[id number] DOB: Feb 10, 1935 DOA: 11/20/2023 PCP: Renford Dills, MD  88/M with chronic diastolic CHF, cardiac amyloidosis, CKD 4, type 2 diabetes mellitus presented to the ED with dyspnea on exertion, swelling and some hallucinations. -In the ED he was tachypneic, hypertensive, labs noted creatinine of 2.3, troponin 131, BNP 670, D-dimer 1.9, CT abdomen with pulmonary nodule and bladder wall thickening  Subjective: Feels better, breathing is improving  Assessment and Plan:  Acute on chronic systolic CHF, Severe BiV Failure Cardiac amyloidosis;  Acute hypoxic respiratory failure  -Previous echo 10/23 with a EF 45-to 50%, severe LVH with speckled pattern, grade 2 DD, severely reduced RV -Repeat ECHo now w/ EF <20%, Grade 3DD, and severely reduced RV -Continue IV Lasix today, tafamidis, cut down Toprol dose 100mg >50 -Hold ACE inhibitor -restart Jardiance tomorrow if creatinine continues to improve -I think his overall prognosis is poor, called and updated wife, will request CHF team input, anticipate need for palliative care eval  Cognitive deficits Delirium -intermittent confusion, sun downing and hallucinations for few years per spouse -TSH normal, check B12  -Suspect he may have early dementia   Elevated D-dimer  - D-dimer is 1.97 in ED  -Clinically do not suspect PE, Dopplers negative    Elevated troponin  -Secondary to type II MI from demand ischemia, CHF -Do not suspect ACS at this time   CKD 4  -Baseline creatinine around 2, now 2.3 -Holding ACE inhibitor, monitor with diuresis   Type II DM  - A1c was 6.5% in November 2024  -Jardiance held   Lung nodule  - Noted on CT in ED  - Discussed with patient, close outpatient follow-up recommended    Urinary bladder wall-thickening  - No sign of infection, urology follow-up after discharge recommended, likely secondary to BPH   Mild thrombocytopenia -Chronic, monitor     DVT  prophylaxis: sq heparin  Code Status: Full  Family Communication: No family at bedside, called and updated spouse Disposition Plan: Home likely 48 hours  Consultants:    Procedures:   Antimicrobials:    Objective: Vitals:   11/21/23 1948 11/22/23 0031 11/22/23 0500 11/22/23 0810  BP: (!) 127/90 (!) 120/108  (!) 137/90  Pulse: (!) 57 87    Resp: (!) 21 (!) 21  17  Temp: 97.8 F (36.6 C) 97.7 F (36.5 C)  97.6 F (36.4 C)  TempSrc: Oral Oral  Oral  SpO2: 100% 100%    Weight:   87.4 kg   Height:        Intake/Output Summary (Last 24 hours) at 11/22/2023 1156 Last data filed at 11/22/2023 0900 Gross per 24 hour  Intake 300 ml  Output 81191 ml  Net -14800 ml   Filed Weights   11/21/23 1700 11/22/23 0500  Weight: 87.3 kg 87.4 kg    Examination:  General exam: Appears calm and comfortable, AAOx2, mild cognitive deficits Respiratory system: Clear to auscultation Cardiovascular system: S1 & S2 heard, RRR.  Abd: nondistended, soft and nontender.Normal bowel sounds heard. Extremities: 1+ edema Skin: No rashes Psychiatry: Flat affect    Data Reviewed:   CBC: Recent Labs  Lab 11/20/23 2319 11/22/23 0431  WBC 3.5* 4.3  NEUTROABS 2.5 2.8  HGB 10.7* 16.7  HCT 34.1* 51.9  MCV 94.2 89.8  PLT 124* 126*   Basic Metabolic Panel: Recent Labs  Lab 11/21/23 0012 11/22/23 0248  NA 142 143  K 4.3 3.8  CL 109 107  CO2 25 26  GLUCOSE 106* 90  BUN 32* 28*  CREATININE 2.37* 1.95*  CALCIUM 8.8* 8.7*  MG  --  2.2   GFR: Estimated Creatinine Clearance: 29.8 mL/min (A) (by C-G formula based on SCr of 1.95 mg/dL (H)). Liver Function Tests: Recent Labs  Lab 11/21/23 0012  AST 29  ALT 21  ALKPHOS 56  BILITOT 0.3  PROT 6.5  ALBUMIN 3.4*   No results for input(s): "LIPASE", "AMYLASE" in the last 168 hours. No results for input(s): "AMMONIA" in the last 168 hours. Coagulation Profile: Recent Labs  Lab 11/21/23 0749  INR 1.0   Cardiac Enzymes: No results  for input(s): "CKTOTAL", "CKMB", "CKMBINDEX", "TROPONINI" in the last 168 hours. BNP (last 3 results) No results for input(s): "PROBNP" in the last 8760 hours. HbA1C: No results for input(s): "HGBA1C" in the last 72 hours. CBG: Recent Labs  Lab 11/21/23 1238 11/21/23 1807 11/21/23 2123 11/22/23 0657 11/22/23 1144  GLUCAP 117* 100* 105* 96 94   Lipid Profile: No results for input(s): "CHOL", "HDL", "LDLCALC", "TRIG", "CHOLHDL", "LDLDIRECT" in the last 72 hours. Thyroid Function Tests: No results for input(s): "TSH", "T4TOTAL", "FREET4", "T3FREE", "THYROIDAB" in the last 72 hours. Anemia Panel: Recent Labs    11/21/23 0749 11/21/23 1821  VITAMINB12 171*  --   FOLATE  --  16.5  FERRITIN 38  --   TIBC 372  --   IRON 69  --   RETICCTPCT 2.0  --    Urine analysis:    Component Value Date/Time   COLORURINE STRAW (A) 11/21/2023 0212   APPEARANCEUR CLEAR 11/21/2023 0212   LABSPEC 1.009 11/21/2023 0212   PHURINE 6.0 11/21/2023 0212   GLUCOSEU >=500 (A) 11/21/2023 0212   HGBUR NEGATIVE 11/21/2023 0212   BILIRUBINUR NEGATIVE 11/21/2023 0212   KETONESUR NEGATIVE 11/21/2023 0212   PROTEINUR 100 (A) 11/21/2023 0212   UROBILINOGEN 1.0 01/26/2012 2310   NITRITE NEGATIVE 11/21/2023 0212   LEUKOCYTESUR NEGATIVE 11/21/2023 0212   Sepsis Labs: @LABRCNTIP (procalcitonin:4,lacticidven:4)  ) Recent Results (from the past 240 hours)  Resp panel by RT-PCR (RSV, Flu A&B, Covid) Anterior Nasal Swab     Status: None   Collection Time: 11/20/23 11:08 PM   Specimen: Anterior Nasal Swab  Result Value Ref Range Status   SARS Coronavirus 2 by RT PCR NEGATIVE NEGATIVE Final   Influenza A by PCR NEGATIVE NEGATIVE Final   Influenza B by PCR NEGATIVE NEGATIVE Final    Comment: (NOTE) The Xpert Xpress SARS-CoV-2/FLU/RSV plus assay is intended as an aid in the diagnosis of influenza from Nasopharyngeal swab specimens and should not be used as a sole basis for treatment. Nasal washings  and aspirates are unacceptable for Xpert Xpress SARS-CoV-2/FLU/RSV testing.  Fact Sheet for Patients: BloggerCourse.com  Fact Sheet for Healthcare Providers: SeriousBroker.it  This test is not yet approved or cleared by the [place] FDA and has been authorized for detection and/or diagnosis of SARS-CoV-2 by FDA under an Emergency Use Authorization (EUA). This EUA will remain in effect (meaning this test can be used) for the duration of the COVID-19 declaration under Section 564(b)(1) of the Act, 21 U.S.C. section 360bbb-3(b)(1), unless the authorization is terminated or revoked.     Resp Syncytial Virus by PCR NEGATIVE NEGATIVE Final    Comment: (NOTE) Fact Sheet for Patients: BloggerCourse.com  Fact Sheet for Healthcare Providers: SeriousBroker.it  This test is not yet approved or cleared by the [place] FDA and has been authorized for detection and/or diagnosis of SARS-CoV-2 by FDA under an  Emergency Use Authorization (EUA). This EUA will remain in effect (meaning this test can be used) for the duration of the COVID-19 declaration under Section 564(b)(1) of the Act, 21 U.S.C. section 360bbb-3(b)(1), unless the authorization is terminated or revoked.  Performed at [facility], [address] [place], [place] [address]      Radiology Studies: ECHOCARDIOGRAM COMPLETE Result Date: 11/22/2023    ECHOCARDIOGRAM REPORT   Patient Name:   Steven Myers Date of Exam: 11/22/2023 Medical Rec #:  [id number]     Height:       72.0 in Accession #:    [id number]    Weight:       192.5 lb Date of Birth:  [birth year]     BSA:          2.096 m Patient Age:    88 years      BP:           120/108 mmHg Patient Gender: M             HR:           73 bpm. Exam Location:  Inpatient Procedure: 2D Echo, Cardiac Doppler, Color Doppler and Intracardiac            Opacification Agent  Indications:    I50.40* Unspecified combined systolic (congestive) and diastolic                 (congestive) heart failure  History:        Patient has prior history of Echocardiogram examinations, most                 recent 09/11/2022. CHF and Cardiomyopathy, Previous Myocardial                 Infarction, Abnormal ECG, Arrythmias:RBBB,                 Signs/Symptoms:Syncope; Risk Factors:Diabetes and Dyslipidemia.                 Cardiac amyloidosis.  Sonographer:    Sheralyn Boatman RDCS Referring Phys: [id number] TIMOTHY S OPYD IMPRESSIONS  1. Left ventricular ejection fraction, by estimation, is <20%. The left ventricle has severely decreased function. The left ventricle demonstrates global hypokinesis. There is severe concentric left ventricular hypertrophy. Left ventricular diastolic parameters are consistent with Grade III diastolic dysfunction (restrictive).  2. Right ventricular systolic function is severely reduced. The right ventricular size is moderately enlarged. Moderately increased right ventricular wall thickness.  3. Left atrial size was severely dilated.  4. Right atrial size was moderately dilated.  5. The mitral valve is grossly normal. Trivial mitral valve regurgitation. No evidence of mitral stenosis.  6. The aortic valve is tricuspid. Aortic valve regurgitation is not visualized. No aortic stenosis is present.  7. Pulmonic valve regurgitation is moderate.  8. Aortic dilatation noted. There is dilatation of the ascending aorta, measuring 39 mm.  9. The inferior vena cava is dilated in size with <50% respiratory variability, suggesting right atrial pressure of 15 mmHg. Comparison(s): The left ventricular function is significantly worse. Conclusion(s)/Recommendation(s): No left ventricular mural or apical thrombus/thrombi. FINDINGS  Left Ventricle: Left ventricular ejection fraction, by estimation, is <20%. The left ventricle has severely decreased function. The left ventricle demonstrates global  hypokinesis. Definity contrast agent was given IV to delineate the left ventricular endocardial borders. The left ventricular internal cavity size was normal in size. There is severe concentric left ventricular hypertrophy. Left ventricular diastolic parameters are consistent  with Grade III diastolic dysfunction (restrictive). Right Ventricle: The right ventricular size is moderately enlarged. Moderately increased right ventricular wall thickness. Right ventricular systolic function is severely reduced. Left Atrium: Left atrial size was severely dilated. Right Atrium: Right atrial size was moderately dilated. Pericardium: There is no evidence of pericardial effusion. Mitral Valve: The mitral valve is grossly normal. Trivial mitral valve regurgitation. No evidence of mitral valve stenosis. Tricuspid Valve: The tricuspid valve is normal in structure. Tricuspid valve regurgitation is trivial. No evidence of tricuspid stenosis. Aortic Valve: The aortic valve is tricuspid. Aortic valve regurgitation is not visualized. No aortic stenosis is present. Pulmonic Valve: The pulmonic valve was normal in structure. Pulmonic valve regurgitation is moderate. No evidence of pulmonic stenosis. Aorta: Aortic dilatation noted. There is dilatation of the ascending aorta, measuring 39 mm. Venous: The inferior vena cava is dilated in size with less than 50% respiratory variability, suggesting right atrial pressure of 15 mmHg. IAS/Shunts: No atrial level shunt detected by color flow Doppler.  LEFT VENTRICLE PLAX 2D LVIDd:         3.95 cm     Diastology LVIDs:         3.50 cm     LV e' medial:    5.66 cm/s LV PW:         2.15 cm     LV E/e' medial:  9.6 LV IVS:        2.20 cm     LV e' lateral:   4.68 cm/s LVOT diam:     2.30 cm     LV E/e' lateral: 11.6 LV SV:         38 LV SV Index:   18 LVOT Area:     4.15 cm  LV Volumes (MOD) LV vol d, MOD A2C: 90.8 ml LV vol d, MOD A4C: 66.4 ml LV vol s, MOD A2C: 63.3 ml LV vol s, MOD A4C: 49.4 ml LV  SV MOD A2C:     27.5 ml LV SV MOD A4C:     66.4 ml LV SV MOD BP:      21.2 ml RIGHT VENTRICLE             IVC RV S prime:     10.30 cm/s  IVC diam: 2.60 cm TAPSE (M-mode): 1.3 cm LEFT ATRIUM             Index        RIGHT ATRIUM           Index LA diam:        4.00 cm 1.91 cm/m   RA Area:     19.70 cm LA Vol (A2C):   63.2 ml 30.15 ml/m  RA Volume:   65.00 ml  31.01 ml/m LA Vol (A4C):   94.6 ml 45.13 ml/m LA Biplane Vol: 77.0 ml 36.74 ml/m  AORTIC VALVE             PULMONIC VALVE LVOT Vmax:   55.50 cm/s  PR End Diast Vel: 2.17 msec LVOT Vmean:  33.500 cm/s LVOT VTI:    0.092 m  AORTA Ao Root diam: 3.70 cm Ao Asc diam:  3.85 cm MITRAL VALVE MV Area (PHT): 3.42 cm    SHUNTS MV Decel Time: 222 msec    Systemic VTI:  0.09 m MV E velocity: 54.50 cm/s  Systemic Diam: 2.30 cm MV A velocity: 35.60 cm/s MV E/A ratio:  1.53 Aditya Sabharwal Electronically signed by Dorthula Nettles Signature Date/Time: 11/22/2023/11:05:31 AM  Final    VAS [place] LOWER EXTREMITY VENOUS (DVT) Result Date: 11/21/2023  Lower Venous DVT Study Patient Name:  Steven Myers  Date of Exam:   11/21/2023 Medical Rec #: [id number]      Accession #:    [id number] Date of Birth: [birth year]      Patient Gender: M Patient Age:   8 years Exam Location:  [facility] Procedure:      VAS [place] LOWER EXTREMITY VENOUS (DVT) Referring Phys: TIMOTHY OPYD --------------------------------------------------------------------------------  Indications: Swelling, and Edema.  Comparison Study: Previous study on 8.19.2024. Performing Technologist: Fernande Bras  Examination Guidelines: A complete evaluation includes B-mode imaging, spectral Doppler, color Doppler, and power Doppler as needed of all accessible portions of each vessel. Bilateral testing is considered an integral part of a complete examination. Limited examinations for reoccurring indications may be performed as noted. The reflux portion of the exam is performed with the patient in reverse  Trendelenburg.  +-----+---------------+---------+-----------+----------+--------------+ RIGHTCompressibilityPhasicitySpontaneityPropertiesThrombus Aging +-----+---------------+---------+-----------+----------+--------------+ CFV  Full           Yes      Yes                                 +-----+---------------+---------+-----------+----------+--------------+ SFJ  Full           Yes      Yes                                 +-----+---------------+---------+-----------+----------+--------------+   +---------+---------------+---------+-----------+----------+--------------+ LEFT     CompressibilityPhasicitySpontaneityPropertiesThrombus Aging +---------+---------------+---------+-----------+----------+--------------+ CFV      Full           Yes      Yes                                 +---------+---------------+---------+-----------+----------+--------------+ SFJ      Full           Yes      Yes                                 +---------+---------------+---------+-----------+----------+--------------+ FV Prox  Full                                                        +---------+---------------+---------+-----------+----------+--------------+ FV Mid   Full                                                        +---------+---------------+---------+-----------+----------+--------------+ FV DistalFull                                                        +---------+---------------+---------+-----------+----------+--------------+ PFV      Full                                                        +---------+---------------+---------+-----------+----------+--------------+  POP      Full           Yes      Yes                                 +---------+---------------+---------+-----------+----------+--------------+ PTV      Full                                                         +---------+---------------+---------+-----------+----------+--------------+ PERO     Full                                                        +---------+---------------+---------+-----------+----------+--------------+     Summary: RIGHT: - No evidence of common femoral vein obstruction.   LEFT: - There is no evidence of deep vein thrombosis in the lower extremity.  - No cystic structure found in the popliteal fossa.  *See table(s) above for measurements and observations. Electronically signed by Heath Lark on 11/21/2023 at 12:33:42 PM.    Final    CT ABDOMEN PELVIS WO CONTRAST Result Date: 11/21/2023 CLINICAL DATA:  Abdominal pain, acute, nonlocalized tachypnea, fluid on his abdomen. SOB. Urinary symptoms x 1 week. A/O x 4, issues with time line. 150/120, hr 80'sRBBB, 84% on RA, placed on NRB, now 100% 15 liters EXAM: CT ABDOMEN AND PELVIS WITHOUT CONTRAST TECHNIQUE: Multidetector CT imaging of the abdomen and pelvis was performed following the standard protocol without IV contrast. RADIATION DOSE REDUCTION: This exam was performed according to the departmental dose-optimization program which includes automated exposure control, adjustment of the mA and/or kV according to patient size and/or use of iterative reconstruction technique. COMPARISON:  None Available. FINDINGS: Lower chest: A 0.9 x 1 cm right middle lobe subpleural nodule. Cardiomegaly. Hepatobiliary: No focal consolidation. No pulmonary nodule. No pulmonary mass. No pleural effusion. No pneumothorax. Pancreas: No focal lesion. Normal pancreatic contour. No surrounding inflammatory changes. No main pancreatic ductal dilatation. Spleen: Normal in size without focal abnormality. Adrenals/Urinary Tract: No adrenal nodule bilaterally. No nephrolithiasis and no hydronephrosis. Fluid density lesions of the kidneys likely represent simple renal cysts. Simple renal cysts, in the absence of clinically indicated signs/symptoms, require no  independent follow-up. No ureterolithiasis or hydroureter. Urinary bladder wall thickening. Stomach/Bowel: Stomach is within normal limits. No evidence of bowel wall thickening or dilatation. Appendix appears normal. Vascular/Lymphatic: No abdominal aorta or iliac aneurysm. Moderate atherosclerotic plaque of the aorta and its branches. No abdominal, pelvic, or inguinal lymphadenopathy. Reproductive: Prostate is unremarkable. Other: Presacral soft tissue density and fat stranding. No intraperitoneal free fluid. No intraperitoneal free gas. No organized fluid collection. Musculoskeletal: No abdominal wall hernia or abnormality.  Diastasis rectus. No suspicious lytic or blastic osseous lesions. No acute displaced fracture. IMPRESSION: 1. Urinary bladder wall thickening. Correlate with urinalysis for infection. Underlying malignancy not excluded. 2. Indeterminate presacral soft tissue density and fat stranding. 3. A 0.9 x 1 cm right middle lobe subpleural nodule. Consider one of the following in 3 months for both low-risk and high-risk individuals: (a) repeat chest CT, (b) follow-up PET-CT, or (c) tissue sampling. This recommendation  follows the consensus statement: Guidelines for Management of Incidental Pulmonary Nodules Detected on CT Images: From the Fleischner Society 2017; Radiology 2017; 284:228-243. 4.  Aortic Atherosclerosis (ICD10-I70.0). Electronically Signed   By: Tish Frederickson M.D.   On: 11/21/2023 02:59   CT Head Wo Contrast Result Date: 11/21/2023 CLINICAL DATA:  Altered mental status EXAM: CT HEAD WITHOUT CONTRAST TECHNIQUE: Contiguous axial images were obtained from the base of the skull through the vertex without intravenous contrast. RADIATION DOSE REDUCTION: This exam was performed according to the departmental dose-optimization program which includes automated exposure control, adjustment of the mA and/or kV according to patient size and/or use of iterative reconstruction technique.  COMPARISON:  11/14/2022 FINDINGS: Brain: No evidence of acute infarction, hemorrhage, mass, mass effect, or midline shift. No hydrocephalus or extra-axial fluid collection. Periventricular white matter changes, likely the sequela of chronic small vessel ischemic disease. Age related cerebral atrophy. Vascular: No hyperdense vessel. Skull: Negative for fracture or focal lesion. Sinuses/Orbits: Mucosal thickening in the ethmoid air cells. No acute finding in the orbits. Other: The mastoid air cells are well aerated. IMPRESSION: No acute intracranial process. Electronically Signed   By: Wiliam Ke M.D.   On: 11/21/2023 02:41   DG Chest Port 1 View Result Date: 11/20/2023 CLINICAL DATA:  Short of breath, altered level of consciousness EXAM: PORTABLE CHEST 1 VIEW COMPARISON:  11/14/2022 FINDINGS: Single frontal view of the chest demonstrates stable enlargement of the cardiac silhouette. No acute airspace disease, effusion, or pneumothorax. No acute bony abnormality. IMPRESSION: Liver would stable chest, no acute process. Electronically Signed   By: Sharlet Salina M.D.   On: 11/20/2023 23:37     Scheduled Meds:  amLODipine  2.5 mg Oral Daily   aspirin EC  81 mg Oral Daily   furosemide  40 mg Intravenous BID   heparin  5,000 Units Subcutaneous Q8H   insulin aspart  0-5 Units Subcutaneous QHS   insulin aspart  0-6 Units Subcutaneous TID WC   metoprolol succinate  100 mg Oral Daily   sodium chloride flush  3 mL Intravenous Q12H   Tafamidis Meglumine (Cardiac)  80 mg Oral Daily   Continuous Infusions:   LOS: 1 day    Time spent:    Zannie Cove, MD [facility]   11/22/2023, 11:56 AM

## 2023-11-22 NOTE — Plan of Care (Signed)
Care plan reviewed.

## 2023-11-22 NOTE — Consult Note (Addendum)
Advanced Heart Failure Team Consult Note   Primary Physician: Renford Dills, MD Cardiologist:  Bryan Lemma, MD  Reason for Consultation: Acute on Chronic BiV Systolic Heart Failure  HPI:    Steven Myers is seen today for evaluation of acute on chronic biventricular systolic heart failure at the request of Dr. Jomarie Longs.   Steven Myers is a 88 y.o. male with HFrEF, cardiac amyloid, T2DM, HLD, obesity, HTN and CKD IIIb.    His cardiac history dates back to 2023 when TTE demonstrated LVEF of 45-50% with severe LVH and inferolateral hypokinesis. He had a SPEP in 8/24 negative for monocolonal gammopathy, followed by positive PYP scan suggestive of grade II/III cardiac ATTR amyloidosis.   Established with Dr. Gasper Lloyd in AHF clinic 10/08/23 and was started on Tafamidis.  Presented to [facility] 12/28 with altered mental status and difficulty breathing. Per EMS report O2 sats 84% on room air. In ED patient mildly hypertensive, BP 145/101 with HR 73. Labs notable for BNP 670, Trop 131>76, BUN/Cr 32/2.37, K 4.3, Hgb 10.7, d-dimer 1.97. CXR with cardiomegaly. EKG SR 88 bpm with RBBB, QRS and a PAC. UA with elevated proteins. RVP negative. CT C/A/P with evidence of bladder wall thickening and small R subpleural nodule. CT head negative. Lower extremity dopplers negative for DVT. He was admitted to the Hospitalist service, echo ordered and started on IV diuresis.  Echo was completed 12/30 which showed EF <20% with severe LVH, GIIIDD, severely reduced RV function, triv MR, mod PR, ~RA 15.  Diuresed 3.2L UOP overnight on Lasix 40 mg IV BID. Improvement in sCr 2.37>1.95.  Home Medications Prior to Admission medications   Medication Sig Start Date End Date Taking? Authorizing Provider  amLODipine (NORVASC) 2.5 MG tablet Take 1 tablet (2.5 mg total) by mouth daily. 10/08/23 01/06/24 Yes Sabharwal, Aditya, DO  artificial tears (LACRILUBE) OINT ophthalmic ointment Place 1 Application into both eyes  See admin instructions. Place as directed into both eyes as directed at bedtime- 5 minutes after Travoprost   Yes [provider]  Artificial Tears ophthalmic solution Place 1 drop into both eyes 3 (three) times daily.   Yes [provider]  aspirin 81 MG tablet Take 81 mg by mouth daily.     Yes [provider]  brimonidine-timolol (COMBIGAN) 0.2-0.5 % ophthalmic solution Place 1 drop into both eyes in the morning and at bedtime.   Yes [provider]  empagliflozin (JARDIANCE) 10 MG TABS tablet Take 1 tablet (10 mg total) by mouth daily before breakfast. 08/11/23  Yes Weaver, Scott T, PA-C  ezetimibe (ZETIA) 10 MG tablet Take 10 mg by mouth daily. 03/21/18  Yes [provider]  latanoprost (XALATAN) 0.005 % ophthalmic solution Place 1 drop into both eyes at bedtime. Hasn't started using yet, finishing up the Travatan Z first. 11/09/23  Yes [provider]  metoprolol succinate (TOPROL-XL) 100 MG 24 hr tablet take 1 tablet by mouth once daily 05/28/12  Yes McClung, Angela M, PA-C  RHOPRESSA 0.02 % SOLN Place 1 drop into both eyes at bedtime. 02/02/23  Yes [provider]  Tafamidis Meglumine, Cardiac, (VYNDAQEL) 20 MG CAPS Take 4 capsules (80 mg total) by mouth daily. 10/18/23  Yes Sabharwal, Aditya, DO  torsemide (DEMADEX) 20 MG tablet Take 1 tablet (20 mg total) by mouth daily. 08/11/23 11/21/23 Yes Weaver, Scott T, PA-C  trandolapril (MAVIK) 4 MG tablet Take 4 mg by mouth daily.   Yes [provider]  Travoprost, BAK Free, (  TRAVATAN Z) 0.004 % SOLN ophthalmic solution Place 1 drop into both eyes at bedtime.   Yes [provider]   Past Medical History: Past Medical History:  Diagnosis Date   Bilateral cataracts    Cardiac amyloidosis (HCC) 07/29/2023   - TTE 09/11/22: Inferolateral HK, severe LVH with speckled pattern (consider amyloid), apical thrombus versus prominent trabeculae (consider CMR), EF 45-50, GR 2 DD, GLS  -17.8, severely reduced RVSF, mild BAE, mild MR, borderline dilation of ascending aorta (38 mm), RAP 3 - SPEP 06/2023 neg for monoclonal gammopathy - 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 05/2018 Echo: EF 60-65%, no rwma, GrI DD. Mildly dil Ao root. Mild BAE; b. 05/2022 Echo: EF 45-50%, inferior basal HK. Sev LVH, GrI DD, mildly enlarged RV w/ mildly reduced fxn. Trivm MR. AoV sclerosis. Asc Ao 39mm.   Chest pain    a. 03/2007 MV: No isch/infarct; b. 04/2010 Lexi MV: EF 62%. No isch/infarct.   CKD (chronic kidney disease) stage 3, GFR 30-59 ml/min (HCC)    DM (diabetes mellitus) type II controlled with renal manifestation (HCC)    Not on insulin   Essential hypertension    Glaucoma    Optho: Dr. Cathey Endow   History of colonoscopy    Hyperlipidemia associated with type 2 diabetes mellitus (HCC)    Intolerant to statins --> because of elevated LFTs.   Obesity due to excess calories without serious comorbidity    BMI ~32 - mostly truncal obesity (mesomorphic)   Osteoarthritis of hip    Right > Left -- limits walking   RBBB (right bundle branch block)    Venous stasis dermatitis of both lower extremities - with edema    L leg worse than R 2/2 prior injury.; controlled somewhat with support hose   Past Surgical History: Past Surgical History:  Procedure Laterality Date   NM MYOVIEW LTD  04/2010   LEXISCAN: EF 62%. NO ISCHEMIA OR INFARCTION. LOW RISK. No RWMA    Family History: Family History  Problem Relation Age of Onset   Diabetes Mother    Heart disease Father 71   Colon cancer Neg Hx    Social History: Social History   Socioeconomic History   Marital status: Married    Spouse name: Not on file   Number of children: 2   Years of education: Not on file   Highest education level: Not on file  Occupational History   Occupation: Paediatric nurse    Comment: self employed  Tobacco Use   Smoking status: Former    Current packs/day: 0.00    Types:  Cigarettes    Quit date: 1980    Years since quitting: 45.0   Smokeless tobacco: Never   Tobacco comments:    quit 40 years ago  Vaping Use   Vaping status: Never Used  Substance and Sexual Activity   Alcohol use: Not Currently    Comment: quit over 20 yr ago   Drug use: No   Sexual activity: Not Currently  Other Topics Concern   Not on file  Social History Narrative   He is married to his wife -[name] ([place].   Accompanied by his daughter.   Still works as a Paediatric nurse    Slow decline in activity level - no routine exercise   Social Drivers of Corporate investment banker Strain: Not on file  Food Insecurity: No Food Insecurity (11/21/2023)   Hunger Vital Sign  Worried About Programme researcher, broadcasting/film/video in the Last Year: Never true    Ran Out of Food in the Last Year: Never true  Transportation Needs: No Transportation Needs (11/21/2023)   PRAPARE - Administrator, Civil Service (Medical): No    Lack of Transportation (Non-Medical): No  Physical Activity: Not on file  Stress: Not on file  Social Connections: Patient Declined (11/22/2023)   Social Connection and Isolation Panel [NHANES]    Frequency of Communication with Friends and Family: Patient declined    Frequency of Social Gatherings with Friends and Family: Patient declined    Attends Religious Services: Patient declined    Database administrator or Organizations: Patient declined    Attends Engineer, structural: Patient declined    Marital Status: Patient declined   Allergies:  No Known Allergies  Objective:    Vital Signs:   Temp:  [97.4 F (36.3 C)-98 F (36.7 C)] 97.6 F (36.4 C) (12/30 0810) Pulse Rate:  [57-87] 87 (12/30 0031) Resp:  [17-21] 17 (12/30 0810) BP: (120-148)/(90-112) 137/90 (12/30 0810) SpO2:  [98 %-100 %] 100 % (12/30 0031) Weight:  [87.3 kg-87.4 kg] 87.4 kg (12/30 0500) Last BM Date :  (PTA)  Weight change: Filed Weights   11/21/23 1700 11/22/23 0500  Weight:  87.3 kg 87.4 kg   Intake/Output:   Intake/Output Summary (Last 24 hours) at 11/22/2023 1202 Last data filed at 11/22/2023 0900 Gross per 24 hour  Intake 300 ml  Output 03474 ml  Net -14800 ml   Physical Exam   General: Elderly, frail appearing. No distress on RA. HEENT: neck supple.   Cardiac: JVP ~10 cm. S1 and S2 present. No murmurs or rub. Resp: Lung sounds clear and equal B/L Abdomen: Soft, non-tender, non-distended. + BS. Extremities: Warm and dry. No rash, cyanosis.  Trace generalized edema.  Neuro: Drowsy and oriented x1. Disoriented to time, place, situation.  Telemetry   Not on tele d/t constant removal by patient. Reorientation and education personally provided.  EKG    No new EKG to review  Labs   Basic Metabolic Panel: Recent Labs  Lab 11/21/23 0012 11/22/23 0248  NA 142 143  K 4.3 3.8  CL 109 107  CO2 25 26  GLUCOSE 106* 90  BUN 32* 28*  CREATININE 2.37* 1.95*  CALCIUM 8.8* 8.7*  MG  --  2.2   Liver Function Tests: Recent Labs  Lab 11/21/23 0012  AST 29  ALT 21  ALKPHOS 56  BILITOT 0.3  PROT 6.5  ALBUMIN 3.4*   No results for input(s): "LIPASE", "AMYLASE" in the last 168 hours. No results for input(s): "AMMONIA" in the last 168 hours.  CBC: Recent Labs  Lab 11/20/23 2319 11/22/23 0431  WBC 3.5* 4.3  NEUTROABS 2.5 2.8  HGB 10.7* 16.7  HCT 34.1* 51.9  MCV 94.2 89.8  PLT 124* 126*   Cardiac Enzymes: No results for input(s): "CKTOTAL", "CKMB", "CKMBINDEX", "TROPONINI" in the last 168 hours.  BNP: BNP (last 3 results) Recent Labs    10/08/23 1130 11/20/23 2300  BNP 771.0* 669.8*   ProBNP (last 3 results) No results for input(s): "PROBNP" in the last 8760 hours.  CBG: Recent Labs  Lab 11/21/23 1238 11/21/23 1807 11/21/23 2123 11/22/23 0657 11/22/23 1144  GLUCAP 117* 100* 105* 96 94   Coagulation Studies: Recent Labs    11/21/23 0749  LABPROT 13.3  INR 1.0   Imaging   ECHOCARDIOGRAM COMPLETE Result Date:  11/22/2023  ECHOCARDIOGRAM REPORT   Patient Name:   Steven Myers Date of Exam: 11/22/2023 Medical Rec #:  [id number]     Height:       72.0 in Accession #:    [id number]    Weight:       192.5 lb Date of Birth:  05/15/1935     BSA:          2.096 m Patient Age:    88 years      BP:           120/108 mmHg Patient Gender: M             HR:           73 bpm. Exam Location:  Inpatient Procedure: 2D Echo, Cardiac Doppler, Color Doppler and Intracardiac            Opacification Agent Indications:    I50.40* Unspecified combined systolic (congestive) and diastolic                 (congestive) heart failure  History:        Patient has prior history of Echocardiogram examinations, most                 recent 09/11/2022. CHF and Cardiomyopathy, Previous Myocardial                 Infarction, Abnormal ECG, Arrythmias:RBBB,                 Signs/Symptoms:Syncope; Risk Factors:Diabetes and Dyslipidemia.                 Cardiac amyloidosis.  Sonographer:    Sheralyn Boatman RDCS Referring Phys: [id number] TIMOTHY S OPYD IMPRESSIONS  1. Left ventricular ejection fraction, by estimation, is <20%. The left ventricle has severely decreased function. The left ventricle demonstrates global hypokinesis. There is severe concentric left ventricular hypertrophy. Left ventricular diastolic parameters are consistent with Grade III diastolic dysfunction (restrictive).  2. Right ventricular systolic function is severely reduced. The right ventricular size is moderately enlarged. Moderately increased right ventricular wall thickness.  3. Left atrial size was severely dilated.  4. Right atrial size was moderately dilated.  5. The mitral valve is grossly normal. Trivial mitral valve regurgitation. No evidence of mitral stenosis.  6. The aortic valve is tricuspid. Aortic valve regurgitation is not visualized. No aortic stenosis is present.  7. Pulmonic valve regurgitation is moderate.  8. Aortic dilatation noted. There is dilatation of the ascending  aorta, measuring 39 mm.  9. The inferior vena cava is dilated in size with <50% respiratory variability, suggesting right atrial pressure of 15 mmHg. Comparison(s): The left ventricular function is significantly worse. Conclusion(s)/Recommendation(s): No left ventricular mural or apical thrombus/thrombi. FINDINGS  Left Ventricle: Left ventricular ejection fraction, by estimation, is <20%. The left ventricle has severely decreased function. The left ventricle demonstrates global hypokinesis. Definity contrast agent was given IV to delineate the left ventricular endocardial borders. The left ventricular internal cavity size was normal in size. There is severe concentric left ventricular hypertrophy. Left ventricular diastolic parameters are consistent with Grade III diastolic dysfunction (restrictive). Right Ventricle: The right ventricular size is moderately enlarged. Moderately increased right ventricular wall thickness. Right ventricular systolic function is severely reduced. Left Atrium: Left atrial size was severely dilated. Right Atrium: Right atrial size was moderately dilated. Pericardium: There is no evidence of pericardial effusion. Mitral Valve: The mitral valve is grossly normal. Trivial mitral valve regurgitation. No evidence of mitral valve stenosis.  Tricuspid Valve: The tricuspid valve is normal in structure. Tricuspid valve regurgitation is trivial. No evidence of tricuspid stenosis. Aortic Valve: The aortic valve is tricuspid. Aortic valve regurgitation is not visualized. No aortic stenosis is present. Pulmonic Valve: The pulmonic valve was normal in structure. Pulmonic valve regurgitation is moderate. No evidence of pulmonic stenosis. Aorta: Aortic dilatation noted. There is dilatation of the ascending aorta, measuring 39 mm. Venous: The inferior vena cava is dilated in size with less than 50% respiratory variability, suggesting right atrial pressure of 15 mmHg. IAS/Shunts: No atrial level shunt  detected by color flow Doppler.  LEFT VENTRICLE PLAX 2D LVIDd:         3.95 cm     Diastology LVIDs:         3.50 cm     LV e' medial:    5.66 cm/s LV PW:         2.15 cm     LV E/e' medial:  9.6 LV IVS:        2.20 cm     LV e' lateral:   4.68 cm/s LVOT diam:     2.30 cm     LV E/e' lateral: 11.6 LV SV:         38 LV SV Index:   18 LVOT Area:     4.15 cm  LV Volumes (MOD) LV vol d, MOD A2C: 90.8 ml LV vol d, MOD A4C: 66.4 ml LV vol s, MOD A2C: 63.3 ml LV vol s, MOD A4C: 49.4 ml LV SV MOD A2C:     27.5 ml LV SV MOD A4C:     66.4 ml LV SV MOD BP:      21.2 ml RIGHT VENTRICLE             IVC RV S prime:     10.30 cm/s  IVC diam: 2.60 cm TAPSE (M-mode): 1.3 cm LEFT ATRIUM             Index        RIGHT ATRIUM           Index LA diam:        4.00 cm 1.91 cm/m   RA Area:     19.70 cm LA Vol (A2C):   63.2 ml 30.15 ml/m  RA Volume:   65.00 ml  31.01 ml/m LA Vol (A4C):   94.6 ml 45.13 ml/m LA Biplane Vol: 77.0 ml 36.74 ml/m  AORTIC VALVE             PULMONIC VALVE LVOT Vmax:   55.50 cm/s  PR End Diast Vel: 2.17 msec LVOT Vmean:  33.500 cm/s LVOT VTI:    0.092 m  AORTA Ao Root diam: 3.70 cm Ao Asc diam:  3.85 cm MITRAL VALVE MV Area (PHT): 3.42 cm    SHUNTS MV Decel Time: 222 msec    Systemic VTI:  0.09 m MV E velocity: 54.50 cm/s  Systemic Diam: 2.30 cm MV A velocity: 35.60 cm/s MV E/A ratio:  1.53 Aditya Sabharwal Electronically signed by Dorthula Nettles Signature Date/Time: 11/22/2023/11:05:31 AM    Final    VAS [place] LOWER EXTREMITY VENOUS (DVT) Result Date: 11/21/2023  Lower Venous DVT Study Patient Name:  Steven Myers  Date of Exam:   11/21/2023 Medical Rec #: [id number]      Accession #:    [id number] Date of Birth: Sep 08, 1935      Patient Gender: M Patient Age:   64 years Exam Location:  [facility]  [facility] Procedure:      VAS [place] LOWER EXTREMITY VENOUS (DVT) Referring Phys: TIMOTHY OPYD --------------------------------------------------------------------------------  Indications: Swelling, and Edema.   Comparison Study: Previous study on 8.19.2024. Performing Technologist: Fernande Bras  Examination Guidelines: A complete evaluation includes B-mode imaging, spectral Doppler, color Doppler, and power Doppler as needed of all accessible portions of each vessel. Bilateral testing is considered an integral part of a complete examination. Limited examinations for reoccurring indications may be performed as noted. The reflux portion of the exam is performed with the patient in reverse Trendelenburg.  +-----+---------------+---------+-----------+----------+--------------+ RIGHTCompressibilityPhasicitySpontaneityPropertiesThrombus Aging +-----+---------------+---------+-----------+----------+--------------+ CFV  Full           Yes      Yes                                 +-----+---------------+---------+-----------+----------+--------------+ SFJ  Full           Yes      Yes                                 +-----+---------------+---------+-----------+----------+--------------+   +---------+---------------+---------+-----------+----------+--------------+ LEFT     CompressibilityPhasicitySpontaneityPropertiesThrombus Aging +---------+---------------+---------+-----------+----------+--------------+ CFV      Full           Yes      Yes                                 +---------+---------------+---------+-----------+----------+--------------+ SFJ      Full           Yes      Yes                                 +---------+---------------+---------+-----------+----------+--------------+ FV Prox  Full                                                        +---------+---------------+---------+-----------+----------+--------------+ FV Mid   Full                                                        +---------+---------------+---------+-----------+----------+--------------+ FV DistalFull                                                         +---------+---------------+---------+-----------+----------+--------------+ PFV      Full                                                        +---------+---------------+---------+-----------+----------+--------------+ POP      Full           Yes      Yes                                 +---------+---------------+---------+-----------+----------+--------------+  PTV      Full                                                        +---------+---------------+---------+-----------+----------+--------------+ PERO     Full                                                        +---------+---------------+---------+-----------+----------+--------------+     Summary: RIGHT: - No evidence of common femoral vein obstruction.   LEFT: - There is no evidence of deep vein thrombosis in the lower extremity.  - No cystic structure found in the popliteal fossa.  *See table(s) above for measurements and observations. Electronically signed by Heath Lark on 11/21/2023 at 12:33:42 PM.    Final     Medications:    Current Medications:  amLODipine  2.5 mg Oral Daily   aspirin EC  81 mg Oral Daily   furosemide  40 mg Intravenous BID   heparin  5,000 Units Subcutaneous Q8H   insulin aspart  0-5 Units Subcutaneous QHS   insulin aspart  0-6 Units Subcutaneous TID WC   metoprolol succinate  100 mg Oral Daily   sodium chloride flush  3 mL Intravenous Q12H   Tafamidis Meglumine (Cardiac)  80 mg Oral Daily    Infusions:   Patient Profile   Steven Myers is a 88 y.o. male with HFrEF, cardiac amyloid, T2DM, HLD, obesity, HTN & CKD III.  Admitted with AMS and volume overload.  Assessment/Plan   Acute on chronic systolic heart failure - Restrictive. Positive for cardiac aTTR amyloid.  - Echo (10/23): EF 45-to 50%, severe LVH with speckled pattern, grade 2 DD, severely reduced RV  - Echo (12/24): EF <20%, severe LVH, GIIIDD, and severely reduced RV, trival MR, mod PR, ~RA 15. - Concern  for low output with presentation of AMS and narrow pulse pressure. Lactic acid 3.2. Place PICC for CVP and Co-ox monitoring - Start Milrinone 0.25 mcg/kg/min - Volume overloaded on exam. Continue Lasix IV 40 mg BID. Strict I/O and daily weights - Continue Tafamadis - Hold bb with acute exacerbation - Hold Jardiance with urinary symptoms (dysuria) - Planned to transition to ARB in outpatient. Continue to hold ACEi for washout and will start ARB/ARNI once renal function stable.  - Palliative care consult for goals of care discussion. Concerned that this is end-stage heart failure. Would not be an advanced therapies candidate given advanced age and severity of kidney disease.   2. Cardiac amyloid - Negative SPEP in 8/24 - PYP Grade II/III on 07/22/23 - Continue Tafamadis (started 11/24) - He and his family referred to Dr. Doristine Locks for genetic testing  3. Acute Hypoxic Respiratory Failure - Requiring PRN O2 for exertion. Likely related to volume overload. Diuresis as above - CT Chest with small R subpleural nodule.   4. Hypertension - Continue Norvasc 2.5 daily.  - Hold Toprol - Titrate GDMT as above  5. CKD Stage IIIb/IV: baseline sCr ~1.7-1.9 - Peaked at 2.37. Today sCr 1.95 - Hold Jardiance with urinary symptoms (dysuria) - Avoid hypotension  6. Iron deficiency anemia - Ferritin 38, Tsat 19 - Will need IV Fe  infusion this admission  Length of Stay: 1  [place] Lee, NP  11/22/2023, 12:02 PM  Advanced Heart Failure Team Pager [phone] (M-F; 7a - 5p)  Please contact [facility] for night-coverage after hours (4p -7a ) and weekends on [email or web address]  Patient seen with NP, agree with the above note.   Patient has history of ATTR cardiac amyloidosis, now admitted with decompensated CHF and confusion.  Lactate 3.2.  Echo this admission showed EF < 20%, severe LVH, severe RV dysfunction with moderate RV enlargement and RVH, IVC dilated.   Patient diuresed very well yesterday if I/Os are  accurate.  He says his breathing is better, getting Lasix 40 mg IV bid.  SBP 130s. Creatinine 2.37 => 1.95.   ECG: NSR, RBBB  General: NAD Neck: JVP 8-9 cm with HJR, no thyromegaly or thyroid nodule.  Lungs: Clear to auscultation bilaterally with normal respiratory effort. CV: Nondisplaced PMI.  Heart regular S1/S2 with widely split S2, no murmur.  No peripheral edema.  No carotid bruit.  Normal pedal pulses.  Abdomen: Soft, nontender, no hepatosplenomegaly, no distention.  Skin: Intact without lesions or rashes.  Neurologic: Alert and oriented x 3.  Psych: Normal affect. Extremities: No clubbing or cyanosis. Extremities are cool.  HEENT: Normal.   Assessment/Plan: 1. 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  PYP scan from 8/24 highly suggestive of ATTR cardiac amyloidosis, he was started on tafamidis.  Echo this admission showed EF < 20%, severe LVH, severe RV dysfunction with moderate RV enlargement and RVH, IVC dilated. He was admitted with low output HF (lactate 3.2) with volume overload.  Apparently diuresed quite well yesterday. Still with some volume overload on exam.  - With low output HF based on cool extremities and elevated lactate, I will place PICC and follow CVP/co-ox.  Creatinine trending down.  - Start milrinone 0.25 mcg/kg/min.  - Give Lasix 40 mg IV bid today, seemed to respond well yesterday.  - Would replace amlodipine with Bidil 0.5 tab tid tomorrow.  - Not candidate for advanced therapies with age and renal dysfunction.  Worry that he has end stage heart failure, will consult palliative care for goals of care discussions.  - Continue his tafamidis.  - Should get genetic testing for hATTR as outpatient.  2. CKD stage IIIb: Baseline creatinine 1.7-1.9.  2.37 => 1.95 with diuresis.  - Eventual SGLT2 inhibitor.  3. HTN: As above, would replace amlodipine with Bidil.  4. DM2: Per primary.   Marca Ancona 11/22/2023 3:07 PM

## 2023-11-22 NOTE — Progress Notes (Addendum)
Patient confused all night, pulling on lines, taking gown off, and trying to get out of bed. Mitts were placed in attempt to prevent patient from pulling off nasal cannula, hospital gown, and telemetry leads. Patient was redirectable until this AM. This RN attempted to place telemetry leads and gown back on patient; however, patient became combative and verbally abusive. This RN attempted to contact family members, first the spouse, next the son, and third the daughter to notify of patient's behavior and requested to come in to sit with patient. The daughter, Jaidin Shkolnik, answered phone call and stated she will come in. Grandville Silos, the son, called back and this RN made him aware of patient's behavior. Will place tele monitor on standby for now and will continue to monitor.

## 2023-11-23 ENCOUNTER — Inpatient Hospital Stay (HOSPITAL_COMMUNITY): Payer: Medicare Other

## 2023-11-23 ENCOUNTER — Telehealth (HOSPITAL_COMMUNITY): Payer: Self-pay | Admitting: Pharmacy Technician

## 2023-11-23 ENCOUNTER — Other Ambulatory Visit (HOSPITAL_COMMUNITY): Payer: Self-pay

## 2023-11-23 DIAGNOSIS — I5043 Acute on chronic combined systolic (congestive) and diastolic (congestive) heart failure: Secondary | ICD-10-CM | POA: Diagnosis not present

## 2023-11-23 DIAGNOSIS — I5023 Acute on chronic systolic (congestive) heart failure: Secondary | ICD-10-CM | POA: Diagnosis not present

## 2023-11-23 LAB — POCT I-STAT 7, (LYTES, BLD GAS, ICA,H+H)
Acid-Base Excess: 2 mmol/L (ref 0.0–2.0)
Bicarbonate: 29.3 mmol/L — ABNORMAL HIGH (ref 20.0–28.0)
Calcium, Ion: 1.15 mmol/L (ref 1.15–1.40)
HCT: 52 % (ref 39.0–52.0)
Hemoglobin: 17.7 g/dL — ABNORMAL HIGH (ref 13.0–17.0)
O2 Saturation: 99 %
Potassium: 4.2 mmol/L (ref 3.5–5.1)
Sodium: 139 mmol/L (ref 135–145)
TCO2: 31 mmol/L (ref 22–32)
pCO2 arterial: 54.9 mm[Hg] — ABNORMAL HIGH (ref 32–48)
pH, Arterial: 7.335 — ABNORMAL LOW (ref 7.35–7.45)
pO2, Arterial: 130 mm[Hg] — ABNORMAL HIGH (ref 83–108)

## 2023-11-23 LAB — GLUCOSE, CAPILLARY
Glucose-Capillary: 120 mg/dL — ABNORMAL HIGH (ref 70–99)
Glucose-Capillary: 131 mg/dL — ABNORMAL HIGH (ref 70–99)
Glucose-Capillary: 135 mg/dL — ABNORMAL HIGH (ref 70–99)
Glucose-Capillary: 117 mg/dL — ABNORMAL HIGH (ref 70–99)
Glucose-Capillary: 73 mg/dL (ref 70–99)

## 2023-11-23 LAB — CBC
HCT: 50.1 % (ref 39.0–52.0)
HCT: 44.9 % (ref 39.0–52.0)
HCT: 47.4 % (ref 39.0–52.0)
Hemoglobin: 16.3 g/dL (ref 13.0–17.0)
Hemoglobin: 14.8 g/dL (ref 13.0–17.0)
Hemoglobin: 15.6 g/dL (ref 13.0–17.0)
MCH: 28.5 pg (ref 26.0–34.0)
MCH: 29.4 pg (ref 26.0–34.0)
MCH: 28.8 pg (ref 26.0–34.0)
MCHC: 32.5 g/dL (ref 30.0–36.0)
MCHC: 33 g/dL (ref 30.0–36.0)
MCHC: 32.9 g/dL (ref 30.0–36.0)
MCV: 87.6 fL (ref 80.0–100.0)
MCV: 89.1 fL (ref 80.0–100.0)
MCV: 87.5 fL (ref 80.0–100.0)
Platelets: 169 10*3/uL (ref 150–400)
Platelets: 153 10*3/uL (ref 150–400)
Platelets: 174 10*3/uL (ref 150–400)
RBC: 5.72 MIL/uL (ref 4.22–5.81)
RBC: 5.04 MIL/uL (ref 4.22–5.81)
RBC: 5.42 MIL/uL (ref 4.22–5.81)
RDW: 15 % (ref 11.5–15.5)
RDW: 14.9 % (ref 11.5–15.5)
RDW: 14.8 % (ref 11.5–15.5)
WBC: 4.5 10*3/uL (ref 4.0–10.5)
WBC: 5.7 10*3/uL (ref 4.0–10.5)
WBC: 7.2 10*3/uL (ref 4.0–10.5)
nRBC: 0 % (ref 0.0–0.2)
nRBC: 0 % (ref 0.0–0.2)
nRBC: 0 % (ref 0.0–0.2)

## 2023-11-23 LAB — BASIC METABOLIC PANEL
Anion gap: 10 (ref 5–15)
Anion gap: 10 (ref 5–15)
BUN: 29 mg/dL — ABNORMAL HIGH (ref 8–23)
BUN: 34 mg/dL — ABNORMAL HIGH (ref 8–23)
CO2: 25 mmol/L (ref 22–32)
CO2: 23 mmol/L (ref 22–32)
Calcium: 8.7 mg/dL — ABNORMAL LOW (ref 8.9–10.3)
Calcium: 8.3 mg/dL — ABNORMAL LOW (ref 8.9–10.3)
Chloride: 104 mmol/L (ref 98–111)
Chloride: 96 mmol/L — ABNORMAL LOW (ref 98–111)
Creatinine, Ser: 1.94 mg/dL — ABNORMAL HIGH (ref 0.61–1.24)
Creatinine, Ser: 2.17 mg/dL — ABNORMAL HIGH (ref 0.61–1.24)
GFR, Estimated: 33 mL/min — ABNORMAL LOW (ref >60)
GFR, Estimated: 29 mL/min — ABNORMAL LOW (ref >60)
Glucose, Bld: 156 mg/dL — ABNORMAL HIGH (ref 70–99)
Glucose, Bld: 443 mg/dL — ABNORMAL HIGH (ref 70–99)
Potassium: 3.7 mmol/L (ref 3.5–5.1)
Potassium: 4.1 mmol/L (ref 3.5–5.1)
Sodium: 139 mmol/L (ref 135–145)
Sodium: 129 mmol/L — ABNORMAL LOW (ref 135–145)

## 2023-11-23 LAB — MAGNESIUM: Magnesium: 2.1 mg/dL (ref 1.7–2.4)

## 2023-11-23 LAB — AMMONIA: Ammonia: 20 umol/L (ref 9–35)

## 2023-11-23 LAB — LACTIC ACID, PLASMA
Lactic Acid, Venous: 1 mmol/L (ref 0.5–1.9)
Lactic Acid, Venous: 1 mmol/L (ref 0.5–1.9)
Lactic Acid, Venous: 2.5 mmol/L (ref 0.5–1.9)

## 2023-11-23 LAB — COOXEMETRY PANEL
Carboxyhemoglobin: 1.7 % — ABNORMAL HIGH (ref 0.5–1.5)
Methemoglobin: <0.7 % (ref 0.0–1.5)
O2 Saturation: 72.3 %
Total hemoglobin: 16.9 g/dL — ABNORMAL HIGH (ref 12.0–16.0)

## 2023-11-23 LAB — VITAMIN B12: Vitamin B-12: 181 pg/mL (ref 180–914)

## 2023-11-23 LAB — PROCALCITONIN: Procalcitonin: <0.1 ng/mL

## 2023-11-23 MED ORDER — SODIUM CHLORIDE 0.9 % IV SOLN
500.0000 mg | Freq: Once | INTRAVENOUS | Status: AC
  Administered 2023-11-23: 500 mg via INTRAVENOUS
  Filled 2023-11-23: qty 25

## 2023-11-23 MED ORDER — POTASSIUM CHLORIDE CRYS ER 20 MEQ PO TBCR
60.0000 meq | EXTENDED_RELEASE_TABLET | Freq: Once | ORAL | Status: AC
  Administered 2023-11-23: 60 meq via ORAL
  Filled 2023-11-23: qty 3

## 2023-11-23 MED ORDER — VITAMIN B-12 1000 MCG PO TABS
1000.0000 ug | ORAL_TABLET | Freq: Every day | ORAL | Status: DC
  Administered 2023-11-23 – 2023-11-26 (×4): 1000 ug via ORAL
  Filled 2023-11-23 (×4): qty 1

## 2023-11-23 MED ORDER — FUROSEMIDE 10 MG/ML IJ SOLN
40.0000 mg | Freq: Two times a day (BID) | INTRAMUSCULAR | Status: AC
Start: 2023-11-23 — End: 2023-11-24
  Administered 2023-11-23 – 2023-11-24 (×3): 40 mg via INTRAVENOUS
  Filled 2023-11-23 (×3): qty 4

## 2023-11-23 MED ORDER — NOREPINEPHRINE 4 MG/250ML-% IV SOLN
0.0000 ug/min | INTRAVENOUS | Status: DC
  Administered 2023-11-23: 12 ug/min via INTRAVENOUS
  Administered 2023-11-23: 20 ug/min via INTRAVENOUS
  Filled 2023-11-23 (×2): qty 250

## 2023-11-23 MED ORDER — IRON SUCROSE 500 MG IVPB - SIMPLE MED
500.0000 mg | Freq: Once | INTRAVENOUS | Status: DC
  Filled 2023-11-23: qty 275

## 2023-11-23 MED ORDER — SODIUM CHLORIDE 0.9 % IV BOLUS
1000.0000 mL | Freq: Once | INTRAVENOUS | Status: AC
  Administered 2023-11-23: 1000 mL via INTRAVENOUS

## 2023-11-23 NOTE — Progress Notes (Signed)
   11/23/23 1400  Spiritual Encounters  Type of Visit Follow up  Care provided to: Patient;Family  Referral source Chaplain assessment  Reason for visit Routine spiritual support  OnCall Visit No   Chaplain followed up with patient. His family was present at bedside. Chaplain provided hospitality. Patient was in good mood and ready to go home tomorrow. Chaplain remains available when needed.

## 2023-11-23 NOTE — TOC Initial Note (Signed)
 Transition of Care [facility]) - Initial/Assessment Note    Patient Details  Name: Steven Myers MRN: [id number] Date of Birth: 10-May-1935  Transition of Care [facility]) CM/SW Contact:    Justina Delcia Czar, RN Phone Number: [phone] 11/23/2023, 4:20 PM  Clinical Narrative:                 HF TOC CM spoke to pt at bedside. Gave permission to speak to son, Zack. CM spoke to son, Zack. Will continue to follow for dc needs.   Expected Discharge Plan: Home w Home Health Services Barriers to Discharge: Continued Medical Work up   Patient Goals and CMS Choice Patient states their goals for this hospitalization and ongoing recovery are:: wants to get better          Expected Discharge Plan and Services   Discharge Planning Services: CM Consult   Living arrangements for the past 2 months: Single Family Home                                      Prior Living Arrangements/Services Living arrangements for the past 2 months: Single Family Home Lives with:: Spouse Patient language and need for interpreter reviewed:: Yes Do you feel safe going back to the place where you live?: Yes      Need for Family Participation in Patient Care: Yes (Comment) Care giver support system in place?: Yes (comment) Current home services: DME (cane, rolling walker) Criminal Activity/Legal Involvement Pertinent to Current Situation/Hospitalization: No - Comment as needed  Activities of Daily Living   ADL Screening (condition at time of admission) Independently performs ADLs?: Yes (appropriate for developmental age) Is the patient deaf or have difficulty hearing?: No Does the patient have difficulty seeing, even when wearing glasses/contacts?: No Does the patient have difficulty concentrating, remembering, or making decisions?: No  Permission Sought/Granted Permission sought to share information with : Case Manager, Family Supports, PCP Permission granted to share information with : Yes, Verbal  Permission Granted  Share Information with NAME: Jovani Flury     Permission granted to share info w Relationship: son  Permission granted to share info w Contact Information: [phone]  Emotional Assessment Appearance:: Appears stated age Attitude/Demeanor/Rapport: Engaged Affect (typically observed): Accepting Orientation: : Oriented to Self, Oriented to Place, Oriented to  Time, Oriented to Situation   Psych Involvement: No (comment)  Admission diagnosis:  Delirium [R41.0] CHF (congestive heart failure) (HCC) [I50.9] Hypoxia [R09.02] Elevated d-dimer [R79.89] Patient Active Problem List   Diagnosis Date Noted   Elevated d-dimer 11/21/2023   Elevated troponin 11/21/2023   Lung nodule 11/21/2023   Bladder wall thickening 11/21/2023   CHF (congestive heart failure) (HCC) 11/21/2023   CKD stage 3b, GFR 30-44 ml/min (HCC) 07/30/2023   Cardiac amyloidosis (HCC) 07/29/2023   Heart failure with mildly reduced ejection fraction (HFmrEF) (HCC) 07/08/2023   LVH (left ventricular hypertrophy) 07/08/2023   Syncope, vasovagal 11/14/2022   Acute on chronic combined systolic and diastolic CHF (congestive heart failure) (HCC) 11/14/2022   Syncope 11/14/2022   Acute renal failure superimposed on stage 3a chronic kidney disease (HCC) 06/11/2022   NSTEMI vs. demand ischemia in setting of covid-19 06/11/2022   COVID-19 11/27/2019   Generalized weakness 11/26/2019   COVID-19 virus infection 11/26/2019   Essential hypertension    DM (diabetes mellitus) type II controlled with renal manifestation (HCC)    Hyperlipidemia associated with type 2 diabetes  mellitus (HCC)    Venous stasis dermatitis of both lower extremities - with edema    DOE (dyspnea on exertion) 06/08/2018   Edema of both lower extremities 06/08/2018   RBBB 05/05/2010   Precordial chest pain 05/05/2010   Gastritis and gastroduodenitis 10/14/2009   FECAL OCCULT BLOOD 09/09/2009   PCP:  Rexanne Ingle, MD Pharmacy:    [facility] [phone] - [place], [place] - [address] AT [facility] OF [address] [facility] [address] [place] [address] Phone: [phone] Fax: [phone]  [place] [facility] [address] [place] [address] Phone: [phone] Fax: [phone]     Social Drivers of Health (SDOH) Social History: SDOH Screenings   Food Insecurity: No Food Insecurity (11/21/2023)  Housing: Low Risk  (11/21/2023)  Transportation Needs: No Transportation Needs (11/21/2023)  Utilities: Not At Risk (11/21/2023)  Social Connections: Patient Declined (11/22/2023)  Tobacco Use: Medium Risk (11/21/2023)   SDOH Interventions:     Readmission Risk Interventions     No data to display

## 2023-11-23 NOTE — Evaluation (Signed)
 Physical Therapy Evaluation Patient Details Name: Steven Myers MRN: [id number] DOB: 17-Oct-1935 Today's Date: 11/23/2023  History of Present Illness  Patient is 88 y.o. male presented with SOB, swelling, and altered mental status. PMH includes NICM, CKD 3, DM, HTN, glaucoma, cataracts, OA.   Clinical Impression  Steven Myers is 88 y.o. male admitted with above HPI and diagnosis. Patient is currently limited by functional impairments below (see PT problem list). Patient lives with spouse and is independent with RW for mobility at baseline. Currently pt requires supervision for bed mob, and CGA for transfers with RW. Min assist needed to steady with gait and cues for position to RW, VSS throughout with pt on RA. Patient will benefit from continued skilled PT interventions to address impairments and progress independence with mobility, recommending HHPT and encouraged family to provide increased supervision initially as pt returns to home/familiar environment. Acute PT will follow and progress as able.         If plan is discharge home, recommend the following: A little help with walking and/or transfers;A little help with bathing/dressing/bathroom;Assistance with cooking/housework;Assist for transportation;Help with stairs or ramp for entrance   Can travel by private vehicle        Equipment Recommendations None recommended by PT  Recommendations for Other Services       Functional Status Assessment Patient has had a recent decline in their functional status and demonstrates the ability to make significant improvements in function in a reasonable and predictable amount of time.     Precautions / Restrictions Precautions Precautions: Fall Restrictions Weight Bearing Restrictions Per Provider Order: No      Mobility  Bed Mobility Overal bed mobility: Needs Assistance Bed Mobility: Supine to Sit     Supine to sit: Supervision     General bed mobility comments: sup for safety  with use of bed features    Transfers Overall transfer level: Needs assistance Equipment used: Rolling walker (2 wheels) Transfers: Sit to/from Stand Sit to Stand: Contact guard assist           General transfer comment: Close CGA for safety with rise from EOB and recliner. pt reliant on use of hands to control rise and lower.    Ambulation/Gait Ambulation/Gait assistance: Min assist Gait Distance (Feet): 40 Feet Assistive device: Rolling walker (2 wheels) Gait Pattern/deviations: Step-through pattern, Decreased stride length, Trunk flexed Gait velocity: decr     General Gait Details: cues for proximity to RW at start and intermittently throughout. no LOB or buckling noted though pt with slight shake/bounce during second half of gait. min assist to steady and mange walker with turning.  Stairs            Wheelchair Mobility     Tilt Bed    Modified Rankin (Stroke Patients Only)       Balance Overall balance assessment: Needs assistance Sitting-balance support: Feet supported, No upper extremity supported Sitting balance-Leahy Scale: Good     Standing balance support: No upper extremity supported, Bilateral upper extremity supported, During functional activity Standing balance-Leahy Scale: Fair Standing balance comment: able to stand at sink without UE support, RW for mobility                             Pertinent Vitals/Pain Pain Assessment Pain Assessment: No/denies pain    Home Living Family/patient expects to be discharged to:: Private residence Living Arrangements: Spouse/significant other Available Help at Discharge: Family;Available 24 hours/day Type  of Home: House Home Access: Stairs to enter   [employer] of Steps: 4 through garage with 2 at main entrance. 3 into den on first floor Alternate Level Stairs-Number of Steps: flight Home Layout: Two level;Bed/bath upstairs;1/2 bath on main level Home Equipment: Rolling  Walker (2 wheels);Shower seat;Cane - single point;Grab bars - tub/shower      Prior Function Prior Level of Function : Needs assist             Mobility Comments: RW inside and outside of the home, sometimes uses cane ADLs Comments: Reports independence with ADLs (confirmed with daughter), able to do light IADLs     Extremity/Trunk Assessment   Upper Extremity Assessment Upper Extremity Assessment: Overall WFL for tasks assessed;Right hand dominant    Lower Extremity Assessment Lower Extremity Assessment: Defer to PT evaluation    Cervical / Trunk Assessment Cervical / Trunk Assessment: Normal  Communication   Communication Communication: No apparent difficulties Cueing Techniques: Verbal cues;Gestural cues  Cognition Arousal: Alert Behavior During Therapy: WFL for tasks assessed/performed Overall Cognitive Status: History of cognitive impairments - at baseline                                 General Comments: hx of dementia but able to report location, decreased awareness of chronic illness and memory deficits with variable PLOF recall        General Comments General comments (skin integrity, edema, etc.): O2 96% on RA    Exercises     Assessment/Plan    PT Assessment Patient needs continued PT services  PT Problem List Decreased strength;Decreased balance;Decreased activity tolerance;Decreased mobility;Decreased cognition;Decreased safety awareness       PT Treatment Interventions DME instruction;Gait training;Stair training;Functional mobility training;Therapeutic activities;Therapeutic exercise;Balance training;Neuromuscular re-education;Cognitive remediation;Patient/family education    PT Goals (Current goals can be found in the Care Plan section)  Acute Rehab PT Goals Patient Stated Goal: none stated by pt, return home PT Goal Formulation: With patient/family Time For Goal Achievement: 12/07/23 Potential to Achieve Goals: Good     Frequency Min 1X/week     Co-evaluation   Reason for Co-Treatment: Necessary to address cognition/behavior during functional activity;Other (comment) (recent agitation/aggressive behaviors w/ baseline cognitive impairments)   OT goals addressed during session: ADL's and self-care;Proper use of Adaptive equipment and DME       AM-PAC PT 6 Clicks Mobility  Outcome Measure Help needed turning from your back to your side while in a flat bed without using bedrails?: A Little Help needed moving from lying on your back to sitting on the side of a flat bed without using bedrails?: A Little Help needed moving to and from a bed to a chair (including a wheelchair)?: A Little Help needed standing up from a chair using your arms (e.g., wheelchair or bedside chair)?: A Little Help needed to walk in hospital room?: A Little Help needed climbing 3-5 steps with a railing? : A Little 6 Click Score: 18    End of Session Equipment Utilized During Treatment: Gait belt Activity Tolerance: Patient tolerated treatment well Patient left: in chair;with call bell/phone within reach;with chair alarm set Nurse Communication: Mobility status PT Visit Diagnosis: Other abnormalities of gait and mobility (R26.89);Muscle weakness (generalized) (M62.81);Difficulty in walking, not elsewhere classified (R26.2)    Time: 9097-9074 PT Time Calculation (min) (ACUTE ONLY): 23 min   Charges:   PT Evaluation $PT Eval Moderate Complexity: 1 Mod  PT General Charges $$ ACUTE PT VISIT: 1 Visit         Vernell KNIGHTS PT, DPT Acute Rehabilitation Services Office [phone]  11/23/23 11:00 AM

## 2023-11-23 NOTE — Evaluation (Signed)
 Occupational Therapy Evaluation Patient Details Name: Steven Myers MRN: [id number] DOB: 20-Feb-1935 Today's Date: 11/23/2023   History of Present Illness Patient is 88 y.o. male presented with SOB, swelling, and altered mental status. PMH includes NICM, CKD 3, DM, HTN, glaucoma, cataracts, OA.   Clinical Impression   PTA, pt lives with wife, typically Modified Independent with ADLs/mobility using RW vs cane. Pt presents now with minor deficits in endurance and balance w/ baseline cognitive deficits. Pt pleasant, able to mobilize using RW with CGA and frequent cueing for DME use. Pt requires no more for Min A for ADLs and anticipate quick progress with continued OOB activity. Daughter arrived at end of session, confirmed PLOF and encouraged increased supervision at home initially to ensure safety in familiar environment. Recommend consideration of HHOT at DC.   Spo2 96% on RA      If plan is discharge home, recommend the following: Assistance with cooking/housework;Direct supervision/assist for medications management;Direct supervision/assist for financial management;Assist for transportation;Help with stairs or ramp for entrance;Supervision due to cognitive status    Functional Status Assessment  Patient has had a recent decline in their functional status and demonstrates the ability to make significant improvements in function in a reasonable and predictable amount of time.  Equipment Recommendations  None recommended by OT    Recommendations for Other Services       Precautions / Restrictions Precautions Precautions: Fall Restrictions Weight Bearing Restrictions Per Provider Order: No      Mobility Bed Mobility Overal bed mobility: Needs Assistance Bed Mobility: Supine to Sit     Supine to sit: Supervision          Transfers Overall transfer level: Needs assistance Equipment used: Rolling walker (2 wheels) Transfers: Sit to/from Stand Sit to Stand: Contact guard  assist                  Balance Overall balance assessment: Needs assistance Sitting-balance support: Feet supported, No upper extremity supported Sitting balance-Leahy Scale: Good     Standing balance support: No upper extremity supported, Bilateral upper extremity supported, During functional activity Standing balance-Leahy Scale: Fair Standing balance comment: able to stand at sink without UE support, RW for mobility                           ADL either performed or assessed with clinical judgement   ADL Overall ADL's : Needs assistance/impaired Eating/Feeding: Independent   Grooming: Supervision/safety;Standing;Wash/dry face Grooming Details (indicate cue type and reason): standing at sink without LOB. cues for stepping into RW Upper Body Bathing: Supervision/ safety   Lower Body Bathing: Minimal assistance;Sit to/from stand;Sitting/lateral leans Lower Body Bathing Details (indicate cue type and reason): assisted for thorough posterior peri care in standing Upper Body Dressing : Supervision/safety   Lower Body Dressing: Minimal assistance;Sitting/lateral leans;Sit to/from stand   Toilet Transfer: Contact guard assist;Ambulation;Rolling walker (2 wheels)   Toileting- Clothing Manipulation and Hygiene: Minimal assistance;Sit to/from stand;Sitting/lateral lean       Functional mobility during ADLs: Contact guard assist;Rolling walker (2 wheels);Cueing for sequencing;Cueing for safety General ADL Comments: Likely close to baseline, able to mobilize in room with RW and cues, stand at sink for basic ADLs and discuss strategies for safe ADL mgmt at home     Vision Ability to See in Adequate Light: 0 Adequate Patient Visual Report: No change from baseline Vision Assessment?: No apparent visual deficits     Perception  Praxis         Pertinent Vitals/Pain Pain Assessment Pain Assessment: No/denies pain     Extremity/Trunk Assessment Upper  Extremity Assessment Upper Extremity Assessment: Overall WFL for tasks assessed;Right hand dominant   Lower Extremity Assessment Lower Extremity Assessment: Defer to PT evaluation   Cervical / Trunk Assessment Cervical / Trunk Assessment: Normal   Communication Communication Communication: No apparent difficulties Cueing Techniques: Verbal cues;Gestural cues   Cognition Arousal: Alert Behavior During Therapy: WFL for tasks assessed/performed Overall Cognitive Status: History of cognitive impairments - at baseline                                 General Comments: hx of dementia but able to report location, decreased awareness of chronic illness and memory deficits with variable PLOF recall     General Comments  O2 96% on RA    Exercises     Shoulder Instructions      Home Living Family/patient expects to be discharged to:: Private residence Living Arrangements: Spouse/significant other Available Help at Discharge: Family;Available 24 hours/day Type of Home: House Home Access: Stairs to enter [employer] of Steps: 4 through garage with 2 at main entrance. 3 into den on first floor   Home Layout: Two level;Bed/bath upstairs;1/2 bath on main level Alternate Level Stairs-Number of Steps: flight Alternate Level Stairs-Rails: Right;Left;Can reach both Bathroom Shower/Tub: Producer, Television/film/video: Standard Bathroom Accessibility: Yes   Home Equipment: Agricultural Consultant (2 wheels);Shower seat;Cane - single point;Grab bars - tub/shower          Prior Functioning/Environment Prior Level of Function : Needs assist             Mobility Comments: RW inside and outside of the home, sometimes uses cane ADLs Comments: Reports independence with ADLs (confirmed with daughter), able to do light IADLs        OT Problem List: Decreased activity tolerance;Impaired balance (sitting and/or standing);Decreased cognition;Decreased safety  awareness;Decreased knowledge of use of DME or AE      OT Treatment/Interventions: Self-care/ADL training;Therapeutic exercise;Energy conservation;DME and/or AE instruction;Therapeutic activities;Patient/family education;Balance training    OT Goals(Current goals can be found in the care plan section) Acute Rehab OT Goals Patient Stated Goal: ready to get out of bed OT Goal Formulation: With patient Time For Goal Achievement: 12/07/23 Potential to Achieve Goals: Good ADL Goals Pt Will Perform Lower Body Bathing: with set-up;sit to/from stand;sitting/lateral leans Pt Will Perform Lower Body Dressing: with supervision;sitting/lateral leans;sit to/from stand Pt Will Transfer to Toilet: ambulating;with modified independence Additional ADL Goal #1: Pt to verbalize at least 3 fall prevention strategies to implement at home  OT Frequency: Min 1X/week    Co-evaluation PT/OT/SLP Co-Evaluation/Treatment: Yes Reason for Co-Treatment: Necessary to address cognition/behavior during functional activity;Other (comment) (recent agitation/aggressive behaviors w/ baseline cognitive impairments)   OT goals addressed during session: ADL's and self-care;Proper use of Adaptive equipment and DME      AM-PAC OT 6 Clicks Daily Activity     Outcome Measure Help from another person eating meals?: None Help from another person taking care of personal grooming?: A Little Help from another person toileting, which includes using toliet, bedpan, or urinal?: A Little Help from another person bathing (including washing, rinsing, drying)?: A Little Help from another person to put on and taking off regular upper body clothing?: A Little Help from another person to put on and taking off regular lower body  clothing?: A Little 6 Click Score: 19   End of Session Equipment Utilized During Treatment: Gait belt;Rolling walker (2 wheels) Nurse Communication: Mobility status  Activity Tolerance: Patient tolerated  treatment well Patient left: in chair;with call bell/phone within reach;with nursing/sitter in room;with chair alarm set  OT Visit Diagnosis: Unsteadiness on feet (R26.81);Other abnormalities of gait and mobility (R26.89);Muscle weakness (generalized) (M62.81);Other symptoms and signs involving cognitive function                Time: 9099-9074 OT Time Calculation (min): 25 min Charges:  OT General Charges $OT Visit: 1 Visit OT Evaluation $OT Eval Low Complexity: 1 Low  Mliss NOVAK, OTR/L Acute Rehab Services Office: [phone]   Mliss Getting 11/23/2023, 10:01 AM

## 2023-11-23 NOTE — Telephone Encounter (Signed)
 Patient Product/process Development Scientist completed.    The patient is insured through [employer]. Patient has Medicare and is not eligible for a copay card, but may be able to apply for patient assistance, if available.    Ran test claim for isosorbide -hydralazine  20-37.5 mgand the current 30 day co-pay is $0.00.   This test claim was processed through [facility]- copay amounts may vary at other pharmacies due to pharmacy/plan contracts, or as the patient moves through the different stages of their insurance plan.     Reyes Sharps, CPHT Pharmacy Technician III Certified Patient Advocate [facility] Patient Advocate Team Direct Number: [phone]  Fax: [phone]

## 2023-11-23 NOTE — Progress Notes (Addendum)
 Heart Failure Progress Update  HF team notified that patient was coding arrived to bedside with Primary MD. Per RN she had gotten patient up to the chair, when she returned he was diaphoretic, unresponsive, hypotensive and favoring right side. On HF arrival, patient was in bed with weak pulse, in trendelenburg, no CPR administered. He was placed on NRB. Moving extremities weakly. BP was 50/40s, HR 60s. Fluid bolus of 250cc was initiated and he was started on NE of 10. Transferred to ICU to stabilize.  -Will need goals of care discussion with family. Palliative consult already in place.  -Titrate NE to maintain MAP >65. -Wean O2 for sats >94

## 2023-11-23 NOTE — Progress Notes (Signed)
 Patient found slumped over in chair unresponsive with right facial droop. Code stroke initiated. CBG 131, B/P 49/36. HR 75 Patient placed back in bed and patient appeared to lose a pulse and became apneic but before CPR was initiated, patient began to move spontaneously. Code team and Dr. Fairy came to bedside. IV bolus and Levophed  started and patient moved to 2H where bedside report was given.

## 2023-11-23 NOTE — Plan of Care (Signed)
  Problem: Education: Goal: Ability to describe self-care measures that may prevent or decrease complications (Diabetes Survival Skills Education) will improve Outcome: Progressing   Problem: Coping: Goal: Ability to adjust to condition or change in health will improve Outcome: Progressing   Problem: Fluid Volume: Goal: Ability to maintain a balanced intake and output will improve Outcome: Progressing   Problem: Metabolic: Goal: Ability to maintain appropriate glucose levels will improve Outcome: Progressing   Problem: Nutritional: Goal: Maintenance of adequate nutrition will improve Outcome: Progressing   Problem: Clinical Measurements: Goal: Respiratory complications will improve Outcome: Progressing Goal: Cardiovascular complication will be avoided Outcome: Progressing   Problem: Elimination: Goal: Will not experience complications related to bowel motility Outcome: Progressing Goal: Will not experience complications related to urinary retention Outcome: Progressing   Problem: Cardiac: Goal: Ability to achieve and maintain adequate cardiopulmonary perfusion will improve Outcome: Progressing

## 2023-11-23 NOTE — Progress Notes (Signed)
IVT consult for code blue; patient has DL PICC. Confirmed that no additional access needed at this time.   Ayomikun Starling Loyola Mast, RN

## 2023-11-23 NOTE — Progress Notes (Addendum)
 Responded to code stroke activation for decreased responsiveness. He was pulseless and apneic but before CPR was performed there was return of consciousness.   On my arrival, he was moving spontaneously, but had a systolic of 49. His pulse remains thready and he is not breathing well, code blue has been activated and I will defer to management of the code team.   If any concerns remain for neurological disease, please call with further questions.   Aisha Seals, MD [facility] [phone]  If 7pm- 7am, please page neurology on call as listed in AMION.

## 2023-11-23 NOTE — Consult Note (Signed)
 NAME:  Steven Myers, MRN:  [id number], DOB:  December 13, 1934, LOS: 2 ADMISSION DATE:  11/20/2023, CONSULTATION DATE:  11/23/23 REFERRING MD:  Fairy, CHIEF COMPLAINT:  SOB   History of Present Illness:  88 year old man with history of cardiac amyloid, CKD 3b, DM2 presenting with decompensated systolic heart failure.  Inotropes and diuresis were being used to offload fluid.  However this am he became less responsive and Bps noted to be markedly low.  Transferred urgently to ICU with no chest compressions needed; started on levophed  and started to become more responsive.  PCCM to assume care now that he's in ICU.  Patient currently denies complaints, breathing has improved.  He is needing a fair bit of O2.  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 05/2018 Echo: EF 60-65%, no rwma, GrI DD. Mildly dil Ao root. Mild BAE; b. 05/2022 Echo: EF 45-50%, inferior basal HK. Sev LVH, GrI DD, mildly enlarged RV w/ mildly reduced fxn. Trivm MR. AoV sclerosis. Asc Ao 39mm.   Chest pain    a. 03/2007 MV: No isch/infarct; b. 04/2010 Lexi MV: EF 62%. No isch/infarct.   CKD (chronic kidney disease) stage 3, GFR 30-59 ml/min (HCC)    DM (diabetes mellitus) type II controlled with renal manifestation (HCC)    Not on insulin    Essential hypertension    Glaucoma    Optho: Dr. Waylan   History of colonoscopy    Hyperlipidemia associated with type 2 diabetes mellitus (HCC)    Intolerant to statins --> because of elevated LFTs.   Obesity due to  excess calories without serious comorbidity    BMI ~32 - mostly truncal obesity (mesomorphic)   Osteoarthritis of hip    Right > Left -- limits walking   RBBB (right bundle branch block)    Venous stasis dermatitis of both lower extremities - with edema    L leg worse than R 2/2 prior injury.; controlled somewhat with support hose     Significant Hospital Events: Including procedures, antibiotic start and stop dates in addition to other pertinent events   12/29 admit 12/31 near-code and ICU admit  Interim History / Subjective:  Consult  Objective   Blood pressure 109/75, pulse 84, temperature 98 F (36.7 C), temperature source Oral, resp. rate 18, height 6' (1.829 m), weight 82.4 kg, SpO2 97%. CVP:  [3 mmHg-5 mmHg] 5 mmHg      Intake/Output Summary (Last 24 hours) at 11/23/2023 1140 Last data filed at 11/23/2023 0818 Gross per 24 hour  Intake 394.25 ml  Output 2100 ml  Net -1705.75 ml   Filed Weights   11/21/23 1700 11/22/23 0500 11/23/23 0508  Weight: 87.3 kg 87.4 kg 82.4 kg    Examination: General: no distress sitting in chair HENT: NRB in place, trachea midline Lungs: Diminished, no crackles or accessory muscle use Cardiovascular: RRR, ext lukewarm Abdomen: soft, +BS Extremities: trace edema Neuro: moves to command Skin: no rashes  Patient Lines/Drains/Airways Status     Active Line/Drains/Airways  Name Placement date Placement time Site Days   PICC Double Lumen 11/22/23 Right Brachial 41 cm 0 cm 11/22/23  1736  -- 1   Wound / Incision (Open or Dehisced) 11/23/23 Non-pressure wound Tibial Distal;Posterior;Right 11/23/23  1115  Tibial  less than 1           CBC benign Cr drifting up Lactate up slightly CXR pretty benign  Resolved Hospital Problem list   N/A  Assessment & Plan:  Shock, think just has really fragile cardiopulmonary baseline with adverse reaction to bidil ; no e/o sepsis Cardiogenic shock, query end stage amyloid  cardiomyopathy DM2 Hx HTN Hx glaucoma Iron  def anemia  - Levophed  for MAP 65 - SSI - Inotropes and diuretics per AHF - Agree with palliative consult - Will take over for TRH while in 2h  Best Practice (right click and Reselect all SmartList Selections daily)   Diet/type: Regular consistency (see orders) DVT prophylaxis prophylactic heparin   Pressure ulcer(s): N/A GI prophylaxis: N/A Lines: yes and it is still needed Foley:  N/A Code Status:  limited Last date of multidisciplinary goals of care discussion [talked to son/patient at bedside]  Labs   CBC: Recent Labs  Lab 11/20/23 2319 11/22/23 0431 11/23/23 0309 11/23/23 1133  WBC 3.5* 4.3 4.5  --   NEUTROABS 2.5 2.8  --   --   HGB 10.7* 16.7 16.3 17.7*  HCT 34.1* 51.9 50.1 52.0  MCV 94.2 89.8 87.6  --   PLT 124* 126* 169  --     Basic Metabolic Panel: Recent Labs  Lab 11/21/23 0012 11/22/23 0248 11/23/23 0309 11/23/23 1133  NA 142 143 139 139  K 4.3 3.8 3.7 4.2  CL 109 107 104  --   CO2 25 26 25   --   GLUCOSE 106* 90 156*  --   BUN 32* 28* 29*  --   CREATININE 2.37* 1.95* 1.94*  --   CALCIUM 8.8* 8.7* 8.7*  --   MG  --  2.2 2.1  --    GFR: Estimated Creatinine Clearance: 30 mL/min (A) (by C-G formula based on SCr of 1.94 mg/dL (H)). Recent Labs  Lab 11/20/23 2319 11/22/23 0431 11/22/23 1254 11/22/23 1530 11/23/23 0309  WBC 3.5* 4.3  --   --  4.5  LATICACIDVEN  --   --  3.2* 2.0*  --     Liver Function Tests: Recent Labs  Lab 11/21/23 0012  AST 29  ALT 21  ALKPHOS 56  BILITOT 0.3  PROT 6.5  ALBUMIN 3.4*   No results for input(s): LIPASE, AMYLASE in the last 168 hours. No results for input(s): AMMONIA in the last 168 hours.  ABG    Component Value Date/Time   PHART 7.335 (L) 11/23/2023 1133   PCO2ART 54.9 (H) 11/23/2023 1133   PO2ART 130 (H) 11/23/2023 1133   HCO3 29.3 (H) 11/23/2023 1133   TCO2 31 11/23/2023 1133   O2SAT 99 11/23/2023 1133     Coagulation  Profile: Recent Labs  Lab 11/21/23 0749  INR 1.0    Cardiac Enzymes: No results for input(s): CKTOTAL, CKMB, CKMBINDEX, TROPONINI in the last 168 hours.  HbA1C: Hgb A1c MFr Bld  Date/Time Value Ref Range Status  10/08/2023 11:30 AM 6.5 (H) 4.8 - 5.6 % Final    Comment:    (NOTE) Pre diabetes:          5.7%-6.4%  Diabetes:              >6.4%  Glycemic control for   <7.0% adults with diabetes   06/11/2022 05:00 PM 6.6 (H) 4.8 - 5.6 % Final    Comment:    (NOTE) Pre diabetes:          5.7%-6.4%  Diabetes:              >6.4%  Glycemic control for   <7.0% adults with diabetes     CBG: Recent Labs  Lab 11/22/23 1144 11/22/23 1515 11/22/23 2137 11/23/23 0610 11/23/23 1047  GLUCAP 94 118* 139* 120* 131*    Review of Systems:    Positive Symptoms in bold:  Constitutional fevers, chills, weight loss, fatigue, anorexia, malaise  Eyes decreased vision, double vision, eye irritation  Ears, Nose, Mouth, Throat sore throat, trouble swallowing, sinus congestion  Cardiovascular chest pain, paroxysmal nocturnal dyspnea, lower ext edema, palpitations   Respiratory SOB, cough, DOE, hemoptysis, wheezing  Gastrointestinal nausea, vomiting, diarrhea  Genitourinary burning with urination, trouble urinating  Musculoskeletal joint aches, joint swelling, back pain  Integumentary  rashes, skin lesions  Neurological focal weakness, focal numbness, trouble speaking, headaches  Psychiatric depression, anxiety, confusion  Endocrine polyuria, polydipsia, cold intolerance, heat intolerance  Hematologic abnormal bruising, abnormal bleeding, unexplained nose bleeds  Allergic/Immunologic recurrent infections, hives, swollen lymph nodes     Past Medical History:  He,  has a past medical history of Bilateral cataracts, Cardiac amyloidosis (HCC) (07/29/2023), Cardiomyopathy (HCC), Chest pain, CKD (chronic kidney disease) stage 3, GFR 30-59 ml/min (HCC), DM (diabetes mellitus)  type II controlled with renal manifestation (HCC), Essential hypertension, Glaucoma, History of colonoscopy, Hyperlipidemia associated with type 2 diabetes mellitus (HCC), Obesity due to excess calories without serious comorbidity, Osteoarthritis of hip, RBBB (right bundle branch block), and Venous stasis dermatitis of both lower extremities - with edema.   Surgical History:   Past Surgical History:  Procedure Laterality Date   NM MYOVIEW  LTD  04/2010   LEXISCAN : EF 62%. NO ISCHEMIA OR INFARCTION. LOW RISK. No RWMA      Social History:   reports that he quit smoking about 45 years ago. His smoking use included cigarettes. He has never used smokeless tobacco. He reports that he does not currently use alcohol . He reports that he does not use drugs.   Family History:  His family history includes Diabetes in his mother; Heart disease (age of onset: 44) in his father. There is no history of Colon cancer.   Allergies No Known Allergies   Home Medications  Prior to Admission medications   Medication Sig Start Date End Date Taking? Authorizing Provider  amLODipine  (NORVASC ) 2.5 MG tablet Take 1 tablet (2.5 mg total) by mouth daily. 10/08/23 01/06/24 Yes Sabharwal, Aditya, DO  artificial tears (LACRILUBE) OINT ophthalmic ointment Place 1 Application into both eyes See admin instructions. Place as directed into both eyes as directed at bedtime- 5 minutes after Travoprost   Yes [provider]  Artificial Tears ophthalmic solution Place 1 drop into both eyes 3 (three) times daily.   Yes [provider]  aspirin  81 MG tablet Take 81 mg by mouth daily.     Yes [provider]  brimonidine -timolol  (COMBIGAN ) 0.2-0.5 % ophthalmic solution Place 1 drop into both eyes in the morning and at bedtime.   Yes [provider]  empagliflozin  (JARDIANCE ) 10 MG TABS tablet Take 1 tablet (10 mg total) by mouth daily before breakfast. 08/11/23  Yes Weaver, Scott T, PA-C  ezetimibe   (ZETIA ) 10 MG tablet Take 10 mg by mouth daily.  03/21/18  Yes [provider]  latanoprost  (XALATAN ) 0.005 % ophthalmic solution Place 1 drop into both eyes at bedtime. Hasn't started using yet, finishing up the Travatan Z first. 11/09/23  Yes [provider]  metoprolol  succinate (TOPROL -XL) 100 MG 24 hr tablet take 1 tablet by mouth once daily 05/28/12  Yes McClung, Angela M, PA-C  RHOPRESSA 0.02 % SOLN Place 1 drop into both eyes at bedtime. 02/02/23  Yes [provider]  Tafamidis  Meglumine , Cardiac, (VYNDAQEL ) 20 MG CAPS Take 4 capsules (80 mg total) by mouth daily. 10/18/23  Yes Sabharwal, Aditya, DO  torsemide  (DEMADEX ) 20 MG tablet Take 1 tablet (20 mg total) by mouth daily. 08/11/23 11/21/23 Yes Weaver, Scott T, PA-C  trandolapril  (MAVIK ) 4 MG tablet Take 4 mg by mouth daily.   Yes [provider]  Travoprost, BAK Free, (TRAVATAN Z) 0.004 % SOLN ophthalmic solution Place 1 drop into both eyes at bedtime.   Yes [provider]     Critical care time: 34 min cc time

## 2023-11-23 NOTE — Progress Notes (Signed)
 ABG results given to Dr. Claudene, Dr. Sigurd and RONAL Coe NP. Verbal order received from Dr. Claudene to wean off NRB as tolerated. Pt transitioned to [place] High Flow Nasal Cannula at 8L without complication. RT will continue to monitor and be available as needed.

## 2023-11-23 NOTE — Progress Notes (Addendum)
 Advanced Heart Failure Rounding Note  Cardiologist: Alm Clay, MD   Subjective:   Chief Complaint: Altered Mental Status  Co-ox 72 on Milrinone  0.25 mcg/kg/min 2.6 L UOP on Lasix  40mg  IV BID.  Remains altered this morning. Sitting up in bed eating breakfast. Denies SOB, CP. Feeling well, stating he is ready to go home, however disoriented to time and situation.  Objective:   Weight Range: 82.4 kg Body mass index is 24.64 kg/m.   Vital Signs:   Temp:  [97.4 F (36.3 C)-98.4 F (36.9 C)] 98 F (36.7 C) (12/31 0817) Pulse Rate:  [66-90] 84 (12/31 0418) Resp:  [16-19] 18 (12/31 0817) BP: (102-151)/(60-100) 109/75 (12/31 0817) SpO2:  [94 %-97 %] 97 % (12/31 0418) Weight:  [82.4 kg] 82.4 kg (12/31 0508) Last BM Date :  (PTA)  Weight change: Filed Weights   11/21/23 1700 11/22/23 0500 11/23/23 0508  Weight: 87.3 kg 87.4 kg 82.4 kg   Intake/Output:   Intake/Output Summary (Last 24 hours) at 11/23/2023 0906 Last data filed at 11/23/2023 0818 Gross per 24 hour  Intake 394.25 ml  Output 2100 ml  Net -1705.75 ml    Physical Exam    General: Well appearing. No distress on RA HEENT: neck supple.   Cardiac: JVP ~8cm. S1 and S2 present. No murmurs or rub. Resp: Lung sounds clear and equal B/L. Fine crackles in bases. Abdomen: Soft, non-tender, non-distended. + BS. Extremities: Warm and dry. No rash, cyanosis.  No peripheral edema.  Neuro: Alert, disoriented to time and situation. Affect pleasant. Moves all extremities without difficulty. Lines/Devices:  RUE PICC  Telemetry   SR in 90s (personally reviewed)  EKG    No new EKG to review  Labs    CBC Recent Labs    11/20/23 2319 11/22/23 0431 11/23/23 0309  WBC 3.5* 4.3 4.5  NEUTROABS 2.5 2.8  --   HGB 10.7* 16.7 16.3  HCT 34.1* 51.9 50.1  MCV 94.2 89.8 87.6  PLT 124* 126* 169   Basic Metabolic Panel Recent Labs    87/69/75 0248 11/23/23 0309  NA 143 139  K 3.8 3.7  CL 107 104  CO2 26 25   GLUCOSE 90 156*  BUN 28* 29*  CREATININE 1.95* 1.94*  CALCIUM 8.7* 8.7*  MG 2.2 2.1   Liver Function Tests Recent Labs    11/21/23 0012  AST 29  ALT 21  ALKPHOS 56  BILITOT 0.3  PROT 6.5  ALBUMIN 3.4*   No results for input(s): LIPASE, AMYLASE in the last 72 hours. Cardiac Enzymes No results for input(s): CKTOTAL, CKMB, CKMBINDEX, TROPONINI in the last 72 hours.  BNP: BNP (last 3 results) Recent Labs    10/08/23 1130 11/20/23 2300  BNP 771.0* 669.8*    ProBNP (last 3 results) No results for input(s): PROBNP in the last 8760 hours.   D-Dimer Recent Labs    11/21/23 0340  DDIMER 1.97*   Hemoglobin A1C No results for input(s): HGBA1C in the last 72 hours. Fasting Lipid Panel No results for input(s): CHOL, HDL, LDLCALC, TRIG, CHOLHDL, LDLDIRECT in the last 72 hours. Thyroid  Function Tests No results for input(s): TSH, T4TOTAL, T3FREE, THYROIDAB in the last 72 hours.  Invalid input(s): FREET3  Other results:  Imaging   US  EKG SITE RITE Result Date: 11/22/2023 If Site Rite image not attached, placement could not be confirmed due to current cardiac rhythm.  ECHOCARDIOGRAM COMPLETE Result Date: 11/22/2023    ECHOCARDIOGRAM REPORT   Patient Name:  Steven Myers Date of Exam: 11/22/2023 Medical Rec #:  [id number]     Height:       72.0 in Accession #:    [id number]    Weight:       192.5 lb Date of Birth:  1934-12-11     BSA:          2.096 m Patient Age:    88 years      BP:           120/108 mmHg Patient Gender: M             HR:           73 bpm. Exam Location:  Inpatient Procedure: 2D Echo, Cardiac Doppler, Color Doppler and Intracardiac            Opacification Agent Indications:    I50.40* Unspecified combined systolic (congestive) and diastolic                 (congestive) heart failure  History:        Patient has prior history of Echocardiogram examinations, most                 recent 09/11/2022. CHF and  Cardiomyopathy, Previous Myocardial                 Infarction, Abnormal ECG, Arrythmias:RBBB,                 Signs/Symptoms:Syncope; Risk Factors:Diabetes and Dyslipidemia.                 Cardiac amyloidosis.  Sonographer:    Ellouise Mose RDCS Referring Phys: [id number] TIMOTHY S OPYD IMPRESSIONS  1. Left ventricular ejection fraction, by estimation, is <20%. The left ventricle has severely decreased function. The left ventricle demonstrates global hypokinesis. There is severe concentric left ventricular hypertrophy. Left ventricular diastolic parameters are consistent with Grade III diastolic dysfunction (restrictive).  2. Right ventricular systolic function is severely reduced. The right ventricular size is moderately enlarged. Moderately increased right ventricular wall thickness.  3. Left atrial size was severely dilated.  4. Right atrial size was moderately dilated.  5. The mitral valve is grossly normal. Trivial mitral valve regurgitation. No evidence of mitral stenosis.  6. The aortic valve is tricuspid. Aortic valve regurgitation is not visualized. No aortic stenosis is present.  7. Pulmonic valve regurgitation is moderate.  8. Aortic dilatation noted. There is dilatation of the ascending aorta, measuring 39 mm.  9. The inferior vena cava is dilated in size with <50% respiratory variability, suggesting right atrial pressure of 15 mmHg. Comparison(s): The left ventricular function is significantly worse. Conclusion(s)/Recommendation(s): No left ventricular mural or apical thrombus/thrombi. FINDINGS  Left Ventricle: Left ventricular ejection fraction, by estimation, is <20%. The left ventricle has severely decreased function. The left ventricle demonstrates global hypokinesis. Definity  contrast agent was given IV to delineate the left ventricular endocardial borders. The left ventricular internal cavity size was normal in size. There is severe concentric left ventricular hypertrophy. Left ventricular diastolic  parameters are consistent with Grade III diastolic dysfunction (restrictive). Right Ventricle: The right ventricular size is moderately enlarged. Moderately increased right ventricular wall thickness. Right ventricular systolic function is severely reduced. Left Atrium: Left atrial size was severely dilated. Right Atrium: Right atrial size was moderately dilated. Pericardium: There is no evidence of pericardial effusion. Mitral Valve: The mitral valve is grossly normal. Trivial mitral valve regurgitation. No evidence of mitral valve stenosis. Tricuspid Valve: The tricuspid valve is normal  in structure. Tricuspid valve regurgitation is trivial. No evidence of tricuspid stenosis. Aortic Valve: The aortic valve is tricuspid. Aortic valve regurgitation is not visualized. No aortic stenosis is present. Pulmonic Valve: The pulmonic valve was normal in structure. Pulmonic valve regurgitation is moderate. No evidence of pulmonic stenosis. Aorta: Aortic dilatation noted. There is dilatation of the ascending aorta, measuring 39 mm. Venous: The inferior vena cava is dilated in size with less than 50% respiratory variability, suggesting right atrial pressure of 15 mmHg. IAS/Shunts: No atrial level shunt detected by color flow Doppler.  LEFT VENTRICLE PLAX 2D LVIDd:         3.95 cm     Diastology LVIDs:         3.50 cm     LV e' medial:    5.66 cm/s LV PW:         2.15 cm     LV E/e' medial:  9.6 LV IVS:        2.20 cm     LV e' lateral:   4.68 cm/s LVOT diam:     2.30 cm     LV E/e' lateral: 11.6 LV SV:         38 LV SV Index:   18 LVOT Area:     4.15 cm  LV Volumes (MOD) LV vol d, MOD A2C: 90.8 ml LV vol d, MOD A4C: 66.4 ml LV vol s, MOD A2C: 63.3 ml LV vol s, MOD A4C: 49.4 ml LV SV MOD A2C:     27.5 ml LV SV MOD A4C:     66.4 ml LV SV MOD BP:      21.2 ml RIGHT VENTRICLE             IVC RV S prime:     10.30 cm/s  IVC diam: 2.60 cm TAPSE (M-mode): 1.3 cm LEFT ATRIUM             Index        RIGHT ATRIUM           Index LA  diam:        4.00 cm 1.91 cm/m   RA Area:     19.70 cm LA Vol (A2C):   63.2 ml 30.15 ml/m  RA Volume:   65.00 ml  31.01 ml/m LA Vol (A4C):   94.6 ml 45.13 ml/m LA Biplane Vol: 77.0 ml 36.74 ml/m  AORTIC VALVE             PULMONIC VALVE LVOT Vmax:   55.50 cm/s  PR End Diast Vel: 2.17 msec LVOT Vmean:  33.500 cm/s LVOT VTI:    0.092 m  AORTA Ao Root diam: 3.70 cm Ao Asc diam:  3.85 cm MITRAL VALVE MV Area (PHT): 3.42 cm    SHUNTS MV Decel Time: 222 msec    Systemic VTI:  0.09 m MV E velocity: 54.50 cm/s  Systemic Diam: 2.30 cm MV A velocity: 35.60 cm/s MV E/A ratio:  1.53 Aditya Sabharwal Electronically signed by Ria Commander Signature Date/Time: 11/22/2023/11:05:31 AM    Final     Medications:    Scheduled Medications:  aspirin  EC  81 mg Oral Daily   Chlorhexidine  Gluconate Cloth  6 each Topical Daily   dorzolamide -timolol   1 drop Both Eyes BID   furosemide   40 mg Intravenous BID   heparin   5,000 Units Subcutaneous Q8H   insulin  aspart  0-5 Units Subcutaneous QHS   insulin  aspart  0-6 Units Subcutaneous TID WC  isosorbide -hydrALAZINE   0.5 tablet Oral TID   latanoprost   1 drop Both Eyes QHS   sodium chloride  flush  10-40 mL Intracatheter Q12H   sodium chloride  flush  3 mL Intravenous Q12H    Infusions:  milrinone  0.25 mcg/kg/min (11/23/23 0512)    PRN Medications: acetaminophen  **OR** acetaminophen , artificial tears, sodium chloride  flush  Patient Profile   Steven Myers is a 88 y.o. male with HFrEF, cardiac amyloid, T2DM, HLD, obesity, HTN & CKD III. Admitted with AMS and volume overload.  Assessment/Plan   1. 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  PYP scan from 8/24 highly suggestive of ATTR cardiac amyloidosis, he was started on tafamidis .  Echo this admission showed EF < 20%, severe LVH, severe RV dysfunction with moderate RV enlargement and RVH, IVC dilated. He was admitted with low output HF (lactate 3.2) with volume  overload. Still with some volume overload on exam.  - With low output HF based on cool extremities and elevated lactate. LA 3.2>2. Repeat this afternoon. - Continue milrinone  0.25 mcg/kg/min. Coox 72 - Mildly volume up by exam. Continue Lasix  40 mg IV bid today, likely can transition to PO tomorrow. - Continue his tafamidis .  - Stop norvasc  and start Bidil  0.5 tab tid today - Hold bb with low output - Hold Jardiance  with urinary symptoms (dysuria) - Not candidate for advanced therapies with age and renal dysfunction.  Worry that he has end stage heart failure, will consult palliative care for goals of care discussions.  - Should get genetic testing for hATTR as outpatient.  2. CKD stage IIIb: Baseline creatinine 1.7-1.9.  2.37 => 1.95 with diuresis. - Stable at 1.94 today 3. HTN: As above, replace amlodipine  with Bidil .  4. DM2: Per primary.  5. Fe deficiency Anemia: Ferritin 38, Tsat 19 - IV Fe infusion this admission  Length of Stay: 2  Jordan Lee, NP  11/23/2023, 9:06 AM  Advanced Heart Failure Team Pager [phone] (M-F; 7a - 5p)  Please contact [facility] for night-coverage after hours (5p -7a ) and weekends on [email or web address]  Patient seen with NP, agree with the above note.   Subsequently to being seen by NP Jama, patient had an episode of unresponsiveness and hypotension, SBP 60s.  He had received Bidil  about 30 minutes prior.  Norepinephrine  was started, now tapering down at 16.  CVP now 6.  Had 250 cc bolus during hypotensive episode. Milrinone  was stopped.   Currently awake and alert, feels fine.   General: NAD Neck: No JVD, no thyromegaly or thyroid  nodule.  Lungs: Clear to auscultation bilaterally with normal respiratory effort. CV: Nondisplaced PMI.  Heart regular S1/S2, no S3/S4, no murmur.  No peripheral edema.   Abdomen: Soft, nontender, no hepatosplenomegaly, no distention.  Skin: Intact without lesions or rashes.  Neurologic: Alert and oriented x 3.  Psych:  Normal affect. Extremities: No clubbing or cyanosis.  HEENT: Normal.   Suspect hypotensive episode may have been iatrogenic related to receiving Bidil .  BP now stable on NE 16.  Earlier today, lactate was normal at 1.0 and creatinine stable at 1.94.  Patient diuresed well on IV Lasix  yesterday, CVP now 6.   As noted before, concerned for end stage HF due to cardiac amyloidosis.  He does not appear significantly volume overloaded and diuresed on milrinone  yesterday.  - Titrate down on NE as able.  - No further Lasix  for now.  - Suspect we are going to have a hard time getting him on  GDMT for HF in setting of cardiac amyloidosis. - Now DNR, agree with palliative care consult for goals of care discussion.   CRITICAL CARE Performed by: Ezra Shuck  Total critical care time: 35 minutes  Critical care time was exclusive of separately billable procedures and treating other patients.  Critical care was necessary to treat or prevent imminent or life-threatening deterioration.  Critical care was time spent personally by me on the following activities: development of treatment plan with patient and/or surrogate as well as nursing, discussions with consultants, evaluation of patient's response to treatment, examination of patient, obtaining history from patient or surrogate, ordering and performing treatments and interventions, ordering and review of laboratory studies, ordering and review of radiographic studies, pulse oximetry and re-evaluation of patient's condition.  Ezra Shuck 11/23/2023 12:57 PM

## 2023-11-23 NOTE — Progress Notes (Signed)
 This RN found patient sitting on edge of bed, confused, PICC line removed and in hand. Patient reoriented and placed back in bed. BP stable without pressors at this time with systolic 110 - 120, and no oxygen required (pulse ox reading at 92 and above). Fellow RN placed two PIVs for access. Will continue to monitor.

## 2023-11-23 NOTE — Progress Notes (Addendum)
 eLink Physician-Brief Progress Note Patient Name: Steven Myers DOB: 05-04-1935 MRN: [id number]   Date of Service  11/23/2023  HPI/Events of Note  88 year old man with history of cardiac amyloid, CKD 3b, DM2 presenting with decompensated systolic heart failure.   Transferred from 3 E. today questioning whether the patient should receive diuresis today.  Earlier doses held due to mild hypotension.  Blood pressure now normal  eICU Interventions  Continue diuresis with first dose now   0537 -somewhat more agitated this morning when he awoke.  Attempted redirection which worked earlier in the morning but now attempting to get out of bed.  QTc 490.  Will attempt Zyprexa  IV.  Continued redirection including call to family.  Intervention Category Minor Interventions: Routine modifications to care plan (e.g. PRN medications for pain, fever)  Rayhana Slider 11/23/2023, 8:51 PM

## 2023-11-23 NOTE — Progress Notes (Signed)
   11/23/23 1200  Spiritual Encounters  Type of Visit Initial  Care provided to: [facility] partners present during encounter Physician;Nurse  Referral source Code page  Reason for visit Code  OnCall Visit No   Chaplain responded to code blue page. Family was present at bedside. Chaplain provided support for family and staff. Chaplain remains available when needed.

## 2023-11-23 NOTE — Significant Event (Signed)
 Rapid Response Event Note   Reason for Call :  Unresponsive with pulse and breathing, concern for stroke d/t right side weakness @ 1045; code stroke activated 1049.   Unable to come to bedside at this time, page Code Stroke and call back with further needs.   Initial Focused Assessment/Interventions/Plan of Care: Code Blue paged out 1051, arrived to find pt on zoll pads receiving blow by via ambu 15L; pt with eyes open, able to follow commands, A&O x self only (orientation fluctuates baseline per report). No compressions received, no meds given via IVP; pulse never lost. Patient initially hypotensive SBP 40s prior to arrival (while still in chair). NS bolus initiated; levo gtt initiated at 10mcg/min per verbal order, then increased to 20mcg/min. Patient transferred to [facility] with belongings. Family aware of transfer and updated in hallway by attending.  Event Summary:  MD Notified:  Call Time: 1045 Arrival Time: 1052 End Time:  Tonna Chiquita POUR, RN

## 2023-11-23 NOTE — Progress Notes (Addendum)
 PROGRESS NOTE    Steven Myers  FMW:[id number] DOB: [birth year] DOA: 11/20/2023 PCP: Rexanne Ingle, MD  88/M with chronic diastolic CHF, cardiac amyloidosis, CKD 4, type 2 diabetes mellitus presented to the ED with dyspnea on exertion, swelling and some hallucinations. -In the ED he was tachypneic, hypertensive, labs noted creatinine of 2.3, troponin 131, BNP 670, D-dimer 1.9, CT abdomen with pulmonary nodule and bladder wall thickening. -Admitted, started on diuretics -Echo 12/30 noted severe biventricular failure, EF less than 20%, grade 3 DD, severely reduced RV, CHF team consulted, started on milrinone /IV Lasix  -12/31 at 10:45 AM, patient was found slumped down in chair by RN, unresponsive, moved to bed, before CPR was initiated he regained consciousness, peripheral pulse +, noted to be hypotensive in 45-65 systolic range, started 500 mL fluid bolus and Levophed  via PICC line  Subjective: Feels better, breathing is improving  Assessment and Plan:  Acute on chronic systolic CHF, Severe BiV Failure Cardiac amyloidosis;  Acute hypoxic respiratory failure  -Previous echo 10/23 with a EF 45-to 50%, severe LVH with speckled pattern, grade 2 DD, severely reduced RV -Repeat ECHo now w/ EF <20%, Grade 3DD, and severely reduced RV -CHF team following, concern for low output, started on Lasix  and milrinone  12/30, urine output appears inaccurate -Beta-blocker and ACE inhibitor on hold -Overall prognosis is poor, palliative consulted, discussed CODE STATUS with patient's children, now agreeable to DNR  Syncope, hypotension -Suspect unresponsive episode from earlier this morning was secondary to hypotension, ? Meds vs  Cardiogenic shock,  -BP improving, wean Levophed  -Unclear if he will need inotropes-?  Dobutamine -No fever or leukocytosis, will also check blood cultures and procalcitonin  Cognitive deficits Delirium -intermittent confusion, sun downing and hallucinations for few years  per spouse -TSH normal, B12 is low, add replacement, will administer IM B12 when more stable -Suspect he may have early dementia   Elevated D-dimer  - D-dimer is 1.97 in ED  -Clinically do not suspect PE, Dopplers negative    Elevated troponin  -Secondary to type II MI from demand ischemia, CHF -Do not suspect ACS at this time   CKD 4  -Baseline creatinine around 2, relatively stable -Holding ACE inhibitor, monitor with diuresis   Type II DM  - A1c was 6.5% in November 2024  -Jardiance  held   Lung nodule  - Noted on CT in ED  - Discussed with patient, close outpatient follow-up recommended    Urinary bladder wall-thickening  - likely secondary to BPH, no signs or symptoms of infection, monitor   Mild thrombocytopenia -Chronic, monitor     DVT prophylaxis: sq heparin   Code Status: now DNR 12/31 Family Communication: Updated son and daughter Disposition Plan: Home likely 48 hours  Consultants:    Procedures:   Antimicrobials:    Objective: Vitals:   11/23/23 0057 11/23/23 0418 11/23/23 0508 11/23/23 0817  BP: (!) 151/100 102/60  109/75  Pulse: 84 84    Resp: 18 17  18   Temp: 97.6 F (36.4 C) (!) 97.4 F (36.3 C)  98 F (36.7 C)  TempSrc: Oral Oral  Oral  SpO2: 96% 97%    Weight:   82.4 kg   Height:        Intake/Output Summary (Last 24 hours) at 11/23/2023 1127 Last data filed at 11/23/2023 0818 Gross per 24 hour  Intake 394.25 ml  Output 2100 ml  Net -1705.75 ml   Filed Weights   11/21/23 1700 11/22/23 0500 11/23/23 0508  Weight: 87.3 kg 87.4  kg 82.4 kg    Examination:  General exam: Chronically ill elderly male sitting up in bed, awake alert oriented to self and partly to place, cognitive deficits noted HEENT: Positive JVD CVS: S1-S2, regular rhythm Lungs: Decreased breath sounds at the bases Abdomen: Soft, nontender, bowel sounds present Extremities: No edema Psychiatry: Flat affect    Data Reviewed:   CBC: Recent Labs  Lab  11/20/23 2319 11/22/23 0431 11/23/23 0309  WBC 3.5* 4.3 4.5  NEUTROABS 2.5 2.8  --   HGB 10.7* 16.7 16.3  HCT 34.1* 51.9 50.1  MCV 94.2 89.8 87.6  PLT 124* 126* 169   Basic Metabolic Panel: Recent Labs  Lab 11/21/23 0012 11/22/23 0248 11/23/23 0309  NA 142 143 139  K 4.3 3.8 3.7  CL 109 107 104  CO2 25 26 25   GLUCOSE 106* 90 156*  BUN 32* 28* 29*  CREATININE 2.37* 1.95* 1.94*  CALCIUM 8.8* 8.7* 8.7*  MG  --  2.2 2.1   GFR: Estimated Creatinine Clearance: 30 mL/min (A) (by C-G formula based on SCr of 1.94 mg/dL (H)). Liver Function Tests: Recent Labs  Lab 11/21/23 0012  AST 29  ALT 21  ALKPHOS 56  BILITOT 0.3  PROT 6.5  ALBUMIN 3.4*   No results for input(s): LIPASE, AMYLASE in the last 168 hours. No results for input(s): AMMONIA in the last 168 hours. Coagulation Profile: Recent Labs  Lab 11/21/23 0749  INR 1.0   Cardiac Enzymes: No results for input(s): CKTOTAL, CKMB, CKMBINDEX, TROPONINI in the last 168 hours. BNP (last 3 results) No results for input(s): PROBNP in the last 8760 hours. HbA1C: No results for input(s): HGBA1C in the last 72 hours. CBG: Recent Labs  Lab 11/22/23 1144 11/22/23 1515 11/22/23 2137 11/23/23 0610 11/23/23 1047  GLUCAP 94 118* 139* 120* 131*   Lipid Profile: No results for input(s): CHOL, HDL, LDLCALC, TRIG, CHOLHDL, LDLDIRECT in the last 72 hours. Thyroid  Function Tests: No results for input(s): TSH, T4TOTAL, FREET4, T3FREE, THYROIDAB in the last 72 hours. Anemia Panel: Recent Labs    11/21/23 0749 11/21/23 1821 11/23/23 0309  VITAMINB12 171*  --  181  FOLATE  --  16.5  --   FERRITIN 38  --   --   TIBC 372  --   --   IRON  69  --   --   RETICCTPCT 2.0  --   --    Urine analysis:    Component Value Date/Time   COLORURINE STRAW (A) 11/21/2023 0212   APPEARANCEUR CLEAR 11/21/2023 0212   LABSPEC 1.009 11/21/2023 0212   PHURINE 6.0 11/21/2023 0212   GLUCOSEU >=500 (A)  11/21/2023 0212   HGBUR NEGATIVE 11/21/2023 0212   BILIRUBINUR NEGATIVE 11/21/2023 0212   KETONESUR NEGATIVE 11/21/2023 0212   PROTEINUR 100 (A) 11/21/2023 0212   UROBILINOGEN 1.0 01/26/2012 2310   NITRITE NEGATIVE 11/21/2023 0212   LEUKOCYTESUR NEGATIVE 11/21/2023 0212   Sepsis Labs: @LABRCNTIP (procalcitonin:4,lacticidven:4)  ) Recent Results (from the past 240 hours)  Resp panel by RT-PCR (RSV, Flu A&B, Covid) Anterior Nasal Swab     Status: None   Collection Time: 11/20/23 11:08 PM   Specimen: Anterior Nasal Swab  Result Value Ref Range Status   SARS Coronavirus 2 by RT PCR NEGATIVE NEGATIVE Final   Influenza A by PCR NEGATIVE NEGATIVE Final   Influenza B by PCR NEGATIVE NEGATIVE Final    Comment: (NOTE) The Xpert Xpress SARS-CoV-2/FLU/RSV plus assay is intended as an aid in the diagnosis of  influenza from Nasopharyngeal swab specimens and should not be used as a sole basis for treatment. Nasal washings and aspirates are unacceptable for Xpert Xpress SARS-CoV-2/FLU/RSV testing.  Fact Sheet for Patients: bloggercourse.com  Fact Sheet for Healthcare Providers: seriousbroker.it  This test is not yet approved or cleared by the [place]  FDA and has been authorized for detection and/or diagnosis of SARS-CoV-2 by FDA under an Emergency Use Authorization (EUA). This EUA will remain in effect (meaning this test can be used) for the duration of the COVID-19 declaration under Section 564(b)(1) of the Act, 21 U.S.C. section 360bbb-3(b)(1), unless the authorization is terminated or revoked.     Resp Syncytial Virus by PCR NEGATIVE NEGATIVE Final    Comment: (NOTE) Fact Sheet for Patients: bloggercourse.com  Fact Sheet for Healthcare Providers: seriousbroker.it  This test is not yet approved or cleared by the [place]  FDA and has been authorized for detection and/or  diagnosis of SARS-CoV-2 by FDA under an Emergency Use Authorization (EUA). This EUA will remain in effect (meaning this test can be used) for the duration of the COVID-19 declaration under Section 564(b)(1) of the Act, 21 U.S.C. section 360bbb-3(b)(1), unless the authorization is terminated or revoked.  Performed at [facility], [address], [place], [place] [address]      Radiology Studies: US  EKG SITE RITE Result Date: 11/22/2023 If Site Rite image not attached, placement could not be confirmed due to current cardiac rhythm.  ECHOCARDIOGRAM COMPLETE Result Date: 11/22/2023    ECHOCARDIOGRAM REPORT   Patient Name:   HARVY RIERA Date of Exam: 11/22/2023 Medical Rec #:  [id number]     Height:       72.0 in Accession #:    [id number]    Weight:       192.5 lb Date of Birth:  [birth year]     BSA:          2.096 m Patient Age:    88 years      BP:           120/108 mmHg Patient Gender: M             HR:           73 bpm. Exam Location:  Inpatient Procedure: 2D Echo, Cardiac Doppler, Color Doppler and Intracardiac            Opacification Agent Indications:    I50.40* Unspecified combined systolic (congestive) and diastolic                 (congestive) heart failure  History:        Patient has prior history of Echocardiogram examinations, most                 recent 09/11/2022. CHF and Cardiomyopathy, Previous Myocardial                 Infarction, Abnormal ECG, Arrythmias:RBBB,                 Signs/Symptoms:Syncope; Risk Factors:Diabetes and Dyslipidemia.                 Cardiac amyloidosis.  Sonographer:    Ellouise Mose RDCS Referring Phys: [id number] TIMOTHY S OPYD IMPRESSIONS  1. Left ventricular ejection fraction, by estimation, is <20%. The left ventricle has severely decreased function. The left ventricle demonstrates global hypokinesis. There is severe concentric left ventricular hypertrophy. Left ventricular diastolic parameters are consistent with Grade III diastolic dysfunction  (restrictive).  2. Right ventricular  systolic function is severely reduced. The right ventricular size is moderately enlarged. Moderately increased right ventricular wall thickness.  3. Left atrial size was severely dilated.  4. Right atrial size was moderately dilated.  5. The mitral valve is grossly normal. Trivial mitral valve regurgitation. No evidence of mitral stenosis.  6. The aortic valve is tricuspid. Aortic valve regurgitation is not visualized. No aortic stenosis is present.  7. Pulmonic valve regurgitation is moderate.  8. Aortic dilatation noted. There is dilatation of the ascending aorta, measuring 39 mm.  9. The inferior vena cava is dilated in size with <50% respiratory variability, suggesting right atrial pressure of 15 mmHg. Comparison(s): The left ventricular function is significantly worse. Conclusion(s)/Recommendation(s): No left ventricular mural or apical thrombus/thrombi. FINDINGS  Left Ventricle: Left ventricular ejection fraction, by estimation, is <20%. The left ventricle has severely decreased function. The left ventricle demonstrates global hypokinesis. Definity  contrast agent was given IV to delineate the left ventricular endocardial borders. The left ventricular internal cavity size was normal in size. There is severe concentric left ventricular hypertrophy. Left ventricular diastolic parameters are consistent with Grade III diastolic dysfunction (restrictive). Right Ventricle: The right ventricular size is moderately enlarged. Moderately increased right ventricular wall thickness. Right ventricular systolic function is severely reduced. Left Atrium: Left atrial size was severely dilated. Right Atrium: Right atrial size was moderately dilated. Pericardium: There is no evidence of pericardial effusion. Mitral Valve: The mitral valve is grossly normal. Trivial mitral valve regurgitation. No evidence of mitral valve stenosis. Tricuspid Valve: The tricuspid valve is normal in structure.  Tricuspid valve regurgitation is trivial. No evidence of tricuspid stenosis. Aortic Valve: The aortic valve is tricuspid. Aortic valve regurgitation is not visualized. No aortic stenosis is present. Pulmonic Valve: The pulmonic valve was normal in structure. Pulmonic valve regurgitation is moderate. No evidence of pulmonic stenosis. Aorta: Aortic dilatation noted. There is dilatation of the ascending aorta, measuring 39 mm. Venous: The inferior vena cava is dilated in size with less than 50% respiratory variability, suggesting right atrial pressure of 15 mmHg. IAS/Shunts: No atrial level shunt detected by color flow Doppler.  LEFT VENTRICLE PLAX 2D LVIDd:         3.95 cm     Diastology LVIDs:         3.50 cm     LV e' medial:    5.66 cm/s LV PW:         2.15 cm     LV E/e' medial:  9.6 LV IVS:        2.20 cm     LV e' lateral:   4.68 cm/s LVOT diam:     2.30 cm     LV E/e' lateral: 11.6 LV SV:         38 LV SV Index:   18 LVOT Area:     4.15 cm  LV Volumes (MOD) LV vol d, MOD A2C: 90.8 ml LV vol d, MOD A4C: 66.4 ml LV vol s, MOD A2C: 63.3 ml LV vol s, MOD A4C: 49.4 ml LV SV MOD A2C:     27.5 ml LV SV MOD A4C:     66.4 ml LV SV MOD BP:      21.2 ml RIGHT VENTRICLE             IVC RV S prime:     10.30 cm/s  IVC diam: 2.60 cm TAPSE (M-mode): 1.3 cm LEFT ATRIUM  Index        RIGHT ATRIUM           Index LA diam:        4.00 cm 1.91 cm/m   RA Area:     19.70 cm LA Vol (A2C):   63.2 ml 30.15 ml/m  RA Volume:   65.00 ml  31.01 ml/m LA Vol (A4C):   94.6 ml 45.13 ml/m LA Biplane Vol: 77.0 ml 36.74 ml/m  AORTIC VALVE             PULMONIC VALVE LVOT Vmax:   55.50 cm/s  PR End Diast Vel: 2.17 msec LVOT Vmean:  33.500 cm/s LVOT VTI:    0.092 m  AORTA Ao Root diam: 3.70 cm Ao Asc diam:  3.85 cm MITRAL VALVE MV Area (PHT): 3.42 cm    SHUNTS MV Decel Time: 222 msec    Systemic VTI:  0.09 m MV E velocity: 54.50 cm/s  Systemic Diam: 2.30 cm MV A velocity: 35.60 cm/s MV E/A ratio:  1.53 Aditya Sabharwal  Electronically signed by Ria Commander Signature Date/Time: 11/22/2023/11:05:31 AM    Final    VAS US  LOWER EXTREMITY VENOUS (DVT) Result Date: 11/21/2023  Lower Venous DVT Study Patient Name:  VENCIL BASNETT  Date of Exam:   11/21/2023 Medical Rec #: [id number]      Accession #:    [id number] Date of Birth: [birth year]      Patient Gender: M Patient Age:   69 years Exam Location:  [facility] Procedure:      VAS US  LOWER EXTREMITY VENOUS (DVT) Referring Phys: TIMOTHY OPYD --------------------------------------------------------------------------------  Indications: Swelling, and Edema.  Comparison Study: Previous study on 8.19.2024. Performing Technologist: Edilia Elden Appl  Examination Guidelines: A complete evaluation includes B-mode imaging, spectral Doppler, color Doppler, and power Doppler as needed of all accessible portions of each vessel. Bilateral testing is considered an integral part of a complete examination. Limited examinations for reoccurring indications may be performed as noted. The reflux portion of the exam is performed with the patient in reverse Trendelenburg.  +-----+---------------+---------+-----------+----------+--------------+ RIGHTCompressibilityPhasicitySpontaneityPropertiesThrombus Aging +-----+---------------+---------+-----------+----------+--------------+ CFV  Full           Yes      Yes                                 +-----+---------------+---------+-----------+----------+--------------+ SFJ  Full           Yes      Yes                                 +-----+---------------+---------+-----------+----------+--------------+   +---------+---------------+---------+-----------+----------+--------------+ LEFT     CompressibilityPhasicitySpontaneityPropertiesThrombus Aging +---------+---------------+---------+-----------+----------+--------------+ CFV      Full           Yes      Yes                                  +---------+---------------+---------+-----------+----------+--------------+ SFJ      Full           Yes      Yes                                 +---------+---------------+---------+-----------+----------+--------------+ FV Prox  Full                                                        +---------+---------------+---------+-----------+----------+--------------+  FV Mid   Full                                                        +---------+---------------+---------+-----------+----------+--------------+ FV DistalFull                                                        +---------+---------------+---------+-----------+----------+--------------+ PFV      Full                                                        +---------+---------------+---------+-----------+----------+--------------+ POP      Full           Yes      Yes                                 +---------+---------------+---------+-----------+----------+--------------+ PTV      Full                                                        +---------+---------------+---------+-----------+----------+--------------+ PERO     Full                                                        +---------+---------------+---------+-----------+----------+--------------+     Summary: RIGHT: - No evidence of common femoral vein obstruction.   LEFT: - There is no evidence of deep vein thrombosis in the lower extremity.  - No cystic structure found in the popliteal fossa.  *See table(s) above for measurements and observations. Electronically signed by Debby Robertson on 11/21/2023 at 12:33:42 PM.    Final      Scheduled Meds:  aspirin  EC  81 mg Oral Daily   Chlorhexidine  Gluconate Cloth  6 each Topical Daily   dorzolamide -timolol   1 drop Both Eyes BID   furosemide   40 mg Intravenous BID   heparin   5,000 Units Subcutaneous Q8H   insulin  aspart  0-5 Units Subcutaneous QHS   insulin  aspart  0-6 Units Subcutaneous  TID WC   latanoprost   1 drop Both Eyes QHS   potassium chloride   60 mEq Oral Once   sodium chloride  flush  10-40 mL Intracatheter Q12H   sodium chloride  flush  3 mL Intravenous Q12H   Continuous Infusions:  iron  sucrose     milrinone  0.25 mcg/kg/min (11/23/23 0512)   norepinephrine  (LEVOPHED ) Adult infusion     sodium chloride        LOS: 2 days    Time spent:    Sigurd Pac, MD [facility]   11/23/2023, 11:27 AM

## 2023-11-23 NOTE — TOC Progression Note (Signed)
 Transition of Care [facility]) - Progression Note    Patient Details  Name: Steven Myers MRN: [id number] Date of Birth: 1934-12-23  [facility] of [facility]) CM/SW Contact  [name] Phone Number: [phone] 11/23/2023, 10:14 AM  Clinical Narrative:   HF CSW met with pt at bedside. CSW introduced herself and explained the role. CSW explained that as we get closer to dc we will follow recommendations from PT/OT and MD. CSW explained that a hospital follow up appointment will be scheduled closer to dc. Pt agrees.   TOC will continue following.          Expected Discharge Plan and Services                                               Social Determinants of Health (SDOH) Interventions SDOH Screenings   Food Insecurity: No Food Insecurity (11/21/2023)  Housing: Low Risk  (11/21/2023)  Transportation Needs: No Transportation Needs (11/21/2023)  Utilities: Not At Risk (11/21/2023)  Social Connections: Patient Declined (11/22/2023)  Tobacco Use: Medium Risk (11/21/2023)    Readmission Risk Interventions     No data to display

## 2023-11-24 DIAGNOSIS — F03C18 Unspecified dementia, severe, with other behavioral disturbance: Secondary | ICD-10-CM

## 2023-11-24 DIAGNOSIS — I5043 Acute on chronic combined systolic (congestive) and diastolic (congestive) heart failure: Secondary | ICD-10-CM | POA: Diagnosis not present

## 2023-11-24 DIAGNOSIS — I5023 Acute on chronic systolic (congestive) heart failure: Secondary | ICD-10-CM | POA: Diagnosis not present

## 2023-11-24 DIAGNOSIS — Z515 Encounter for palliative care: Secondary | ICD-10-CM

## 2023-11-24 DIAGNOSIS — R451 Restlessness and agitation: Secondary | ICD-10-CM

## 2023-11-24 LAB — BASIC METABOLIC PANEL
Anion gap: 12 (ref 5–15)
BUN: 36 mg/dL — ABNORMAL HIGH (ref 8–23)
CO2: 28 mmol/L (ref 22–32)
Calcium: 9.3 mg/dL (ref 8.9–10.3)
Chloride: 102 mmol/L (ref 98–111)
Creatinine, Ser: 2.47 mg/dL — ABNORMAL HIGH (ref 0.61–1.24)
GFR, Estimated: 25 mL/min — ABNORMAL LOW (ref >60)
Glucose, Bld: 132 mg/dL — ABNORMAL HIGH (ref 70–99)
Potassium: 5 mmol/L (ref 3.5–5.1)
Sodium: 142 mmol/L (ref 135–145)

## 2023-11-24 LAB — CBC
HCT: 52.4 % — ABNORMAL HIGH (ref 39.0–52.0)
Hemoglobin: 17.4 g/dL — ABNORMAL HIGH (ref 13.0–17.0)
MCH: 29 pg (ref 26.0–34.0)
MCHC: 33.2 g/dL (ref 30.0–36.0)
MCV: 87.2 fL (ref 80.0–100.0)
Platelets: 177 10*3/uL (ref 150–400)
RBC: 6.01 MIL/uL — ABNORMAL HIGH (ref 4.22–5.81)
RDW: 14.9 % (ref 11.5–15.5)
WBC: 6.4 10*3/uL (ref 4.0–10.5)
nRBC: 0 % (ref 0.0–0.2)

## 2023-11-24 LAB — GLUCOSE, CAPILLARY
Glucose-Capillary: 114 mg/dL — ABNORMAL HIGH (ref 70–99)
Glucose-Capillary: 162 mg/dL — ABNORMAL HIGH (ref 70–99)
Glucose-Capillary: 107 mg/dL — ABNORMAL HIGH (ref 70–99)
Glucose-Capillary: 71 mg/dL (ref 70–99)
Glucose-Capillary: 107 mg/dL — ABNORMAL HIGH (ref 70–99)

## 2023-11-24 LAB — MAGNESIUM: Magnesium: 2.3 mg/dL (ref 1.7–2.4)

## 2023-11-24 LAB — LACTIC ACID, PLASMA: Lactic Acid, Venous: 1.4 mmol/L (ref 0.5–1.9)

## 2023-11-24 MED ORDER — CHLORHEXIDINE GLUCONATE CLOTH 2 % EX PADS
6.0000 | MEDICATED_PAD | Freq: Every day | CUTANEOUS | Status: DC
  Administered 2023-11-25 – 2023-11-26 (×2): 6 via TOPICAL

## 2023-11-24 MED ORDER — EMPAGLIFLOZIN 10 MG PO TABS
10.0000 mg | ORAL_TABLET | Freq: Every day | ORAL | Status: DC
  Administered 2023-11-24 – 2023-11-26 (×3): 10 mg via ORAL
  Filled 2023-11-24 (×3): qty 1

## 2023-11-24 MED ORDER — STERILE WATER FOR INJECTION IJ SOLN
INTRAMUSCULAR | Status: AC
  Administered 2023-11-24: 2.1 mL
  Filled 2023-11-24: qty 10

## 2023-11-24 MED ORDER — METOPROLOL SUCCINATE ER 25 MG PO TB24
25.0000 mg | ORAL_TABLET | Freq: Every day | ORAL | Status: DC
  Administered 2023-11-24 – 2023-11-26 (×3): 25 mg via ORAL
  Filled 2023-11-24 (×3): qty 1

## 2023-11-24 MED ORDER — TORSEMIDE 20 MG PO TABS
40.0000 mg | ORAL_TABLET | Freq: Every day | ORAL | Status: DC
  Administered 2023-11-25 – 2023-11-26 (×2): 40 mg via ORAL
  Filled 2023-11-24 (×2): qty 2

## 2023-11-24 MED ORDER — OLANZAPINE 10 MG IM SOLR
5.0000 mg | Freq: Once | INTRAMUSCULAR | Status: AC
  Administered 2023-11-24: 5 mg via INTRAVENOUS
  Filled 2023-11-24: qty 10

## 2023-11-24 MED ORDER — OLANZAPINE 10 MG IM SOLR
2.5000 mg | Freq: Four times a day (QID) | INTRAMUSCULAR | Status: DC | PRN
Start: 2023-11-24 — End: 2023-11-26
  Administered 2023-11-24: 2.5 mg via INTRAVENOUS
  Filled 2023-11-24 (×2): qty 10

## 2023-11-24 NOTE — TOC Progression Note (Signed)
 Transition of Care [facility]) - Progression Note    Patient Details  Name: Steven Myers MRN: [id number] Date of Birth: 03-11-1935  [facility]) CM/SW Contact  Bridget Cordella Simmonds, LCSW Phone Number: 11/24/2023, 1:55 PM  Clinical Narrative:   CSW informed that pt son Steven Myers requesting pt referral be sent out for SNF.  Referral sent out in hub.      Expected Discharge Plan: Home w Home Health Services Barriers to Discharge: Continued Medical Work up  Expected Discharge Plan and Services   Discharge Planning Services: CM Consult   Living arrangements for the past 2 months: Single Family Home                                       Social Determinants of Health (SDOH) Interventions SDOH Screenings   Food Insecurity: No Food Insecurity (11/21/2023)  Housing: Low Risk  (11/21/2023)  Transportation Needs: No Transportation Needs (11/21/2023)  Utilities: Not At Risk (11/21/2023)  Social Connections: Patient Declined (11/22/2023)  Tobacco Use: Medium Risk (11/21/2023)    Readmission Risk Interventions     No data to display

## 2023-11-24 NOTE — Progress Notes (Signed)
 Pt admitted to rm 14 from 2h. Initiated tele. Oriented pt to the unit. vSS. Call bell within reach.   Lawson Radar, RN

## 2023-11-24 NOTE — Progress Notes (Addendum)
 eLink Physician-Brief Progress Note Patient Name: Steven Myers DOB: 04-14-1935 MRN: [id number]   Date of Service  11/24/2023  HPI/Events of Note  89 year old man with history of cardiac amyloid, CKD 3b, DM2 presenting with decompensated systolic heart failure.   Has been having sundowning with intermittent episodes of agitation.  Received Zyprexa  for the past 2 nights with good effect.  eICU Interventions  Will repeat Zyprexa , IM tonight 2.5 mg x 2 doses as needed   0610 - attempting to throw the remote, bite staff, and removed IV and monitoring. Received zyprexa  IM x1 with moderate effect. Usually, he tires out with some time and calms down but requires restraints in the interim. The patient sundowns each night the past 3 nights - consider initiating an antipsychotic or other scheduled medications to prevent the need for restraints  Intervention Category Minor Interventions: Agitation / anxiety - evaluation and management  [name] 11/24/2023, 10:49 PM

## 2023-11-24 NOTE — Progress Notes (Signed)
 Patient attempting to get out of bed, agitated, kicking and striking at staff. Refusing assistance from staff insisting he sees his mother sitting in an empty chair that he points to across the room. Multiple attempts to re-orient patient has been unsuccessful, however patient has been cooperated with returning to bed. E-link has been paged for PRN medication which currently this patient with dementia has none. Will continue to monitor.

## 2023-11-24 NOTE — Progress Notes (Signed)
 Patient ID: Steven Myers, male   DOB: 1935-05-24, 89 y.o.   MRN: [id number]     Advanced Heart Failure Rounding Note  Cardiologist: Alm Clay, MD   Subjective:   Chief Complaint: Altered Mental Status  Episode of hypotension/unresponsiveness yesterday after receiving Bidil .  He was moved to the ICU and started on norepinephrine  with milrinone  stopped. Now off norepinephrine  and milrinone  with SBP generally 130s.    He pulled out his PICC line.   He is agitated this morning but oriented to place.  Insists that he is going to go home.   Objective:   Weight Range: 84.8 kg Body mass index is 25.35 kg/m.   Vital Signs:   Temp:  [97.9 F (36.6 C)-98.4 F (36.9 C)] 98.3 F (36.8 C) (01/01 0800) Pulse Rate:  [66-139] 96 (01/01 0700) Resp:  [11-38] 18 (01/01 0700) BP: (49-169)/(36-128) 169/111 (01/01 0700) SpO2:  [80 %-100 %] 86 % (01/01 0700) Weight:  [84.8 kg] 84.8 kg (01/01 0500) Last BM Date : 11/22/23  Weight change: Filed Weights   11/22/23 0500 11/23/23 0508 11/24/23 0500  Weight: 87.4 kg 82.4 kg 84.8 kg   Intake/Output:   Intake/Output Summary (Last 24 hours) at 11/24/2023 0958 Last data filed at 11/24/2023 0200 Gross per 24 hour  Intake 821.86 ml  Output 1425 ml  Net -603.14 ml    Physical Exam    General: NAD Neck: JVP 8-9 cm, no thyromegaly or thyroid  nodule.  Lungs: Clear to auscultation bilaterally with normal respiratory effort. CV: Nondisplaced PMI.  Heart regular S1/S2, no S3/S4, no murmur.  No peripheral edema.   Abdomen: Soft, nontender, no hepatosplenomegaly, no distention.  Skin: Intact without lesions or rashes.  Neurologic: Alert and oriented x 3.  Psych: Normal affect. Extremities: No clubbing or cyanosis.  HEENT: Normal.   Telemetry   SR in 90s (personally reviewed)  EKG    No new EKG to review  Labs    CBC Recent Labs    11/22/23 0431 11/23/23 0309 11/23/23 1258 11/24/23 0250  WBC 4.3   < > 7.2 6.4  NEUTROABS 2.8  --   --    --   HGB 16.7   < > 15.6 17.4*  HCT 51.9   < > 47.4 52.4*  MCV 89.8   < > 87.5 87.2  PLT 126*   < > 174 177   < > = values in this interval not displayed.   Basic Metabolic Panel Recent Labs    87/68/75 0309 11/23/23 1133 11/23/23 1258 11/24/23 0250  NA 139   < > 129* 142  K 3.7   < > 4.1 5.0  CL 104  --  96* 102  CO2 25  --  23 28  GLUCOSE 156*  --  443* 132*  BUN 29*  --  34* 36*  CREATININE 1.94*  --  2.17* 2.47*  CALCIUM 8.7*  --  8.3* 9.3  MG 2.1  --   --  2.3   < > = values in this interval not displayed.   Liver Function Tests No results for input(s): AST, ALT, ALKPHOS, BILITOT, PROT, ALBUMIN in the last 72 hours.  No results for input(s): LIPASE, AMYLASE in the last 72 hours. Cardiac Enzymes No results for input(s): CKTOTAL, CKMB, CKMBINDEX, TROPONINI in the last 72 hours.  BNP: BNP (last 3 results) Recent Labs    10/08/23 1130 11/20/23 2300  BNP 771.0* 669.8*    ProBNP (last 3 results) No results for input(s):  PROBNP in the last 8760 hours.   D-Dimer No results for input(s): DDIMER in the last 72 hours.  Hemoglobin A1C No results for input(s): HGBA1C in the last 72 hours. Fasting Lipid Panel No results for input(s): CHOL, HDL, LDLCALC, TRIG, CHOLHDL, LDLDIRECT in the last 72 hours. Thyroid  Function Tests No results for input(s): TSH, T4TOTAL, T3FREE, THYROIDAB in the last 72 hours.  Invalid input(s): FREET3  Other results:  Imaging   DG Chest Port 1 View Result Date: 11/23/2023 CLINICAL DATA:  Status post cardiac arrest. EXAM: PORTABLE CHEST 1 VIEW COMPARISON:  X-ray 11/20/2023. FINDINGS: Enlarged cardiopericardial silhouette. Tortuous ectatic aorta. Right-sided PICC with the tip overlying the right atrium. Underinflation with some prominence of the central vasculature. No pneumothorax, effusion, consolidation or edema. Overlapping cardiac leads and defibrillator pads. IMPRESSION: Enlarged  heart with some central vascular prominence. Right-sided PICC. Underinflation Electronically Signed   By: Ranell Bring M.D.   On: 11/23/2023 14:12    Medications:    Scheduled Medications:  aspirin  EC  81 mg Oral Daily   Chlorhexidine  Gluconate Cloth  6 each Topical Daily   vitamin B-12  1,000 mcg Oral Daily   dorzolamide -timolol   1 drop Both Eyes BID   furosemide   40 mg Intravenous BID   heparin   5,000 Units Subcutaneous Q8H   insulin  aspart  0-5 Units Subcutaneous QHS   insulin  aspart  0-6 Units Subcutaneous TID WC   latanoprost   1 drop Both Eyes QHS   sodium chloride  flush  10-40 mL Intracatheter Q12H   sodium chloride  flush  3 mL Intravenous Q12H    Infusions:    PRN Medications: acetaminophen  **OR** acetaminophen , artificial tears, sodium chloride  flush  Patient Profile   Steven Myers is a 89 y.o. male with HFrEF, cardiac amyloid, T2DM, HLD, obesity, HTN & CKD III. Admitted with AMS and volume overload.  Assessment/Plan   1. 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  PYP scan from 8/24 highly suggestive of ATTR cardiac amyloidosis, he was started on tafamidis .  Echo this admission showed EF < 20%, severe LVH, severe RV dysfunction with moderate RV enlargement and RVH, IVC dilated. He was admitted with low output HF (lactate 3.2) with volume overload. Initially started on milrinone  empirically with elevated lactate and PICC placed.  Hypotensive after Bidil  dose 12/31, then NE started/milrinone  stopped. Now off NE with stable BP, SBP 130s.  Appears mildly volume overloaded on exam.  Creatinine up to 2.47.  - With hypotensive episode and creatinine up, we are going to be significantly limited in terms of GDMT.  Also, cardiac amyloidosis patients tend to tolerate GDMT poorly.  - Would give Lasix  40 mg IV bid, transition to torsemide  40 mg daily tomorrow if patient stays in hospital.  - Continue his tafamidis .  - Avoid beta blocker with  low output and cardiac amyloidosis.  - Can restart Jardiance  10 mg daily.  - Not candidate for advanced therapies with age and renal dysfunction.  I suspect he has end stage heart failure due to cardiac amyloidosis. Palliative care consult recommended for goals of care, current DNR.  - Consider genetic testing for hATTR as outpatient.  2. CKD stage IIIb: Baseline creatinine 1.7-1.9.  2.37 => 1.95 => 2.47 with diuresis. - 1 additional dose IV Lasix  this evening (if he stays in hospital), then po tomorrow.  3. HTN: SBP 130s. Avoid meds for now with hypotensive episode.  4. DM2: Per primary.  5. Fe deficiency Anemia: Ferritin 38, Tsat 19 -  IV Fe infusion this admission  Patient insisting on going home today.  Hemodynamically stable and I do not think that I have much more to offer from a cardiac standpoint.  Suspect end-stage cardiac amyloidosis with markedly low LV/RV function.  Would be appropriate for hospice care I think. Patient is agitated this morning and does not want to discuss this.  Will need to see if he is safe to go home.   Length of Stay: 3  Ezra Shuck, MD  11/24/2023, 9:58 AM  Advanced Heart Failure Team Pager [phone] (M-F; 7a - 5p)  Please contact [facility] for night-coverage after hours (5p -7a ) and weekends on [email or web address]

## 2023-11-24 NOTE — Plan of Care (Signed)
  Problem: Education: Goal: Ability to describe self-care measures that may prevent or decrease complications (Diabetes Survival Skills Education) will improve Outcome: Not Progressing   Problem: Coping: Goal: Ability to adjust to condition or change in health will improve Outcome: Not Progressing   Problem: Fluid Volume: Goal: Ability to maintain a balanced intake and output will improve Outcome: Progressing   Problem: Health Behavior/Discharge Planning: Goal: Ability to identify and utilize available resources and services will improve Outcome: Not Progressing Goal: Ability to manage health-related needs will improve Outcome: Not Progressing   Problem: Nutritional: Goal: Maintenance of adequate nutrition will improve Outcome: Progressing Goal: Progress toward achieving an optimal weight will improve Outcome: Progressing   Problem: Skin Integrity: Goal: Risk for impaired skin integrity will decrease Outcome: Progressing

## 2023-11-24 NOTE — Progress Notes (Signed)
 NAME:  Steven Myers, MRN:  [id number], DOB:  Nov 23, 1935, LOS: 3 ADMISSION DATE:  11/20/2023, CONSULTATION DATE:  11/23/23 REFERRING MD:  Fairy, CHIEF COMPLAINT:  SOB   History of Present Illness:  89 year old man with history of cardiac amyloid, CKD 3b, DM2 presenting with decompensated systolic heart failure.  Inotropes and diuresis were being used to offload fluid.  However this am he became less responsive and Bps noted to be markedly low.  Transferred urgently to ICU with no chest compressions needed; started on levophed  and started to become more responsive.  PCCM to assume care now that he's in ICU.  Patient currently denies complaints, breathing has improved.  He is needing a fair bit of O2.  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 05/2018 Echo: EF 60-65%, no rwma, GrI DD. Mildly dil Ao root. Mild BAE; b. 05/2022 Echo: EF 45-50%, inferior basal HK. Sev LVH, GrI DD, mildly enlarged RV w/ mildly reduced fxn. Trivm MR. AoV sclerosis. Asc Ao 39mm.   Chest pain    a. 03/2007 MV: No isch/infarct; b. 04/2010 Lexi MV: EF 62%. No isch/infarct.   CKD (chronic kidney disease) stage 3, GFR 30-59 ml/min (HCC)    DM (diabetes mellitus) type II controlled with renal manifestation (HCC)    Not on insulin    Essential hypertension    Glaucoma    Optho: Dr. Waylan   History of colonoscopy    Hyperlipidemia associated with type 2 diabetes mellitus (HCC)    Intolerant to statins --> because of elevated LFTs.   Obesity due to  excess calories without serious comorbidity    BMI ~32 - mostly truncal obesity (mesomorphic)   Osteoarthritis of hip    Right > Left -- limits walking   RBBB (right bundle branch block)    Venous stasis dermatitis of both lower extremities - with edema    L leg worse than R 2/2 prior injury.; controlled somewhat with support hose     Significant Hospital Events: Including procedures, antibiotic start and stop dates in addition to other pertinent events   12/29 admit 12/31 near-code and ICU admit  Interim History / Subjective:  Ripped out PICC and took off his [place] Wants to go home Son/daughter at bedside Vitals look pretty good off pressors and O2  Objective   Blood pressure (!) 158/111, pulse 98, temperature 98.3 F (36.8 C), temperature source Oral, resp. rate (!) 23, height 6' (1.829 m), weight 84.8 kg, SpO2 96%. CVP:  [4 mmHg-10 mmHg] 4 mmHg      Intake/Output Summary (Last 24 hours) at 11/24/2023 1130 Last data filed at 11/24/2023 0200 Gross per 24 hour  Intake 821.86 ml  Output 1425 ml  Net -603.14 ml   Filed Weights   11/22/23 0500 11/23/23 0508 11/24/23 0500  Weight: 87.4 kg 82.4 kg 84.8 kg    Examination: No distress Agitated but I'm not really sure confused; he is making sense in his thought processes and oriented Mild JVD with +HJR Ext  warm Moves to command  PT/OT/AHF notes reviewed  Resolved Hospital Problem list   N/A  Assessment & Plan:  Shock state 12/31 likely related to medication End stage amyloid cardiomyopathy Volume overloaded state of heart improved AKI on CKD DM2  - Palliative to see today - Starting low dose metoprolol  XL - IV lasix  x 1 more day - Likely home tomorrow once we assure correct home services in place (PT/OT/hospice); patient agreeable to stay one more night but does not sound like he will be willing to stay longer than that - Patient/family updated at length  Rolan Sharps MD PCCM

## 2023-11-24 NOTE — NC FL2 (Signed)
 [place]  MEDICAID FL2 LEVEL OF CARE FORM     IDENTIFICATION  Patient Name: Steven Myers Birthdate: 09/25/1935 Sex: male Admission Date (Current Location): 11/20/2023  [facility] and [place] Number:  Producer, Television/film/video and Address:  [facility], [address], [place], [place] [address]      Provider Number: [id number]  Attending Physician Name and Address:  Claudene Toribio BROCKS, MD  Relative Name and Phone Number:  Khiree, Bukhari [phone]    Current Level of Care: Hospital Recommended Level of Care: Skilled Nursing Facility Prior Approval Number:    Date Approved/Denied:   PASRR Number: [id number]  Discharge Plan: SNF    Current Diagnoses: Patient Active Problem List   Diagnosis Date Noted   Elevated d-dimer 11/21/2023   Elevated troponin 11/21/2023   Lung nodule 11/21/2023   Bladder wall thickening 11/21/2023   CHF (congestive heart failure) (HCC) 11/21/2023   CKD stage 3b, GFR 30-44 ml/min (HCC) 07/30/2023   Cardiac amyloidosis (HCC) 07/29/2023   Heart failure with mildly reduced ejection fraction (HFmrEF) (HCC) 07/08/2023   LVH (left ventricular hypertrophy) 07/08/2023   Syncope, vasovagal 11/14/2022   Acute on chronic combined systolic and diastolic CHF (congestive heart failure) (HCC) 11/14/2022   Syncope 11/14/2022   Acute renal failure superimposed on stage 3a chronic kidney disease (HCC) 06/11/2022   NSTEMI vs. demand ischemia in setting of covid-19 06/11/2022   COVID-19 11/27/2019   Generalized weakness 11/26/2019   COVID-19 virus infection 11/26/2019   Essential hypertension    DM (diabetes mellitus) type II controlled with renal manifestation (HCC)    Hyperlipidemia associated with type 2 diabetes mellitus (HCC)    Venous stasis dermatitis of both lower extremities - with edema    DOE (dyspnea on exertion) 06/08/2018   Edema of both lower extremities 06/08/2018   RBBB 05/05/2010   Precordial chest pain 05/05/2010    Gastritis and gastroduodenitis 10/14/2009   FECAL OCCULT BLOOD 09/09/2009    Orientation RESPIRATION BLADDER Height & Weight     Self, Time, Situation, Place  Normal Incontinent Weight: 186 lb 15.2 oz (84.8 kg) Height:  6' (182.9 cm)  BEHAVIORAL SYMPTOMS/MOOD NEUROLOGICAL BOWEL NUTRITION STATUS      Continent Diet (see discharge summary)  AMBULATORY STATUS COMMUNICATION OF NEEDS Skin   Limited Assist Verbally Skin abrasions                       Personal Care Assistance Level of Assistance  Bathing, Feeding, Dressing Bathing Assistance: Limited assistance Feeding assistance: Limited assistance Dressing Assistance: Limited assistance     Functional Limitations Info  Sight, Hearing, Speech Sight Info: Adequate Hearing Info: Adequate Speech Info: Adequate    SPECIAL CARE FACTORS FREQUENCY  PT (By licensed PT), OT (By licensed OT)     PT Frequency: 5x week OT Frequency: 5x week            Contractures Contractures Info: Not present    Additional Factors Info  Code Status, Allergies, Insulin  Sliding Scale Code Status Info: DNR Allergies Info: NKA   Insulin  Sliding Scale Info: Novolog : see discharge summary       Current Medications (11/24/2023):  This is the current hospital active medication list Current Facility-Administered Medications  Medication Dose Route Frequency Provider Last Rate Last Admin   acetaminophen  (TYLENOL ) tablet 650 mg  650 mg Oral Q6H PRN Opyd, Timothy S, MD   650 mg at 11/22/23 1258   Or   acetaminophen  (TYLENOL ) suppository  650 mg  650 mg Rectal Q6H PRN Opyd, Timothy S, MD       artificial tears (LACRILUBE) ophthalmic ointment   Both Eyes TID PRN Fairy Frames, MD       aspirin  EC tablet 81 mg  81 mg Oral Daily Opyd, Timothy S, MD   81 mg at 11/24/23 1040   [START ON 11/25/2023] Chlorhexidine  Gluconate Cloth 2 % PADS 6 each  6 each Topical Q0600 Claudene Toribio BROCKS, MD       cyanocobalamin  (VITAMIN B12) tablet 1,000 mcg  1,000 mcg Oral  Daily Joseph, Preetha, MD   1,000 mcg at 11/24/23 1040   dorzolamide -timolol  (COSOPT ) 2-0.5 % ophthalmic solution 1 drop  1 drop Both Eyes BID Fairy Frames, MD   1 drop at 11/24/23 1041   empagliflozin  (JARDIANCE ) tablet 10 mg  10 mg Oral Daily McLean, Dalton S, MD   10 mg at 11/24/23 1040   furosemide  (LASIX ) injection 40 mg  40 mg Intravenous BID McLean, Dalton S, MD   40 mg at 11/24/23 9163   heparin  injection 5,000 Units  5,000 Units Subcutaneous Q8H Opyd, Timothy S, MD   5,000 Units at 11/24/23 0602   insulin  aspart (novoLOG ) injection 0-5 Units  0-5 Units Subcutaneous QHS Opyd, Timothy S, MD       insulin  aspart (novoLOG ) injection 0-6 Units  0-6 Units Subcutaneous TID WC Opyd, Timothy S, MD       latanoprost  (XALATAN ) 0.005 % ophthalmic solution 1 drop  1 drop Both Eyes QHS Fairy Frames, MD   1 drop at 11/23/23 2141   metoprolol  succinate (TOPROL -XL) 24 hr tablet 25 mg  25 mg Oral Daily McLean, Dalton S, MD   25 mg at 11/24/23 1050   sodium chloride  flush (NS) 0.9 % injection 3 mL  3 mL Intravenous Q12H Opyd, Timothy S, MD   3 mL at 11/24/23 1041   [START ON 11/25/2023] torsemide  (DEMADEX ) tablet 40 mg  40 mg Oral Daily McLean, Dalton S, MD         Discharge Medications: Please see discharge summary for a list of discharge medications.  Relevant Imaging Results:  Relevant Lab Results:   Additional Information SS#[id number]  Bridget Cordella Simmonds, LCSW

## 2023-11-24 NOTE — Consult Note (Signed)
 Consultation Note Date: 11/24/2023   Patient Name: Steven Myers  DOB: May 27, 1935  MRN: [id number]  Age / Sex: 89 y.o., male  PCP: Steven Ingle, MD Referring Physician: Claudene Toribio BROCKS, MD  Reason for Consultation: Establishing goals of care  HPI/Patient Profile: 89 y.o. male   admitted on 11/20/2023 with history of cardiac amyloid, CKD 3b, DM2 presenting with decompensated systolic heart failure, dementia. Inotropes and diuresis were being used to offload fluid. He became less responsive and Bps noted to be markedly low. Transferred urgently to ICU with no chest compressions needed; started on levophed  and started to become more responsive.   Remains in ICU,  going confusion and agitation  Family face treatment option decisions,Advanced directive decisions and anticipatory care needs.  Clinical Assessment and Goals of Care:  This NP Steven Myers reviewed medical records, received report from team, assessed the patient and then meet with the patient's family to include his wife/by telephone, son and daughter to discuss diagnosis, prognosis, GOC, EOL wishes disposition and options.   Concept of Palliative Care was introduced as specialized medical care for people and their families living with serious illness.  If focuses on providing relief from the symptoms and stress of a serious illness.  The goal is to improve quality of life for both the patient and the family.   Values and goals of care important to patient and family were attempted to be elicited.  Created space and opportunity for  family to explore thoughts and feelings regarding current medical situation.  Patient's wife clearly verbalizes/recognizes the patient's continued physical, functional and cognitive decline over the past many months.   She expresses concern for her ability to care for him safely in their home.  She is his main  caregiver.  Education offered today on patient's multiple comorbidities specific to the medical conditions cardiac amyloid, chronic kidney disease, diabetes, decompensated systolic heart failure, complicated  with significant cognitive impairment and  associated agitated behaviors.   Education offered on the natural trajectory and expectation with the diagnosis of dementia   A  discussion was had today regarding advanced directives.  Concepts specific to code status, artifical feeding and hydration, continued IV antibiotics and rehospitalization was had.    The difference between an aggressive medical intervention path  and a palliative comfort care path for this patient at this time was had.    Education  offered on hospice benefit; philosophy and eligibility.  We reviewed services in the home, in skilled nursing facility, and residential hospice services.    Explored the financial burden of in home out of pocket caregivers and SNF custodial care.      Encouraged family to discuss and process situation.   It is important to clarify GOC and what is important to Steven Myers at this time in his life.  His wife worries  he is not happy like this  They plan to discuss and get back to me later today.  Possible dc on the next few days.  Family interested in SNF  for short term rehab.   MOST form introduced and a Hard Choices booklet left for review   Natural trajectory and expectations at EOL were discussed.  Questions and concerns addressed.    Family encouraged to call with questions or concerns.     PMT will continue to support holistically.         Patient's wife is next of kin and main decision maker, she is supported by her children        SUMMARY OF RECOMMENDATIONS    Code Status/Advance Care Planning: Limited code   Symptom Management:  Education offered on the utilization of medications to treat agitation  behaviors associated with dementia.  We discussed the risks and  benefits in detail  Palliative Prophylaxis:  Aspiration, Delirium Protocol, and Frequent Pain Assessment  Additional Recommendations (Limitations, Scope, Preferences): Explore option of SNF for short term rehab  Psycho-social/Spiritual:  Desire for further Chaplaincy support:no Additional Recommendations: Education on Hospice  Prognosis:  I believe patient is hospice eligible with a prognosis of six months or less and I shared this with the family  Discharge Planning: To Be Determined      Primary Diagnoses: Present on Admission:  Elevated d-dimer  CKD stage 3b, GFR 30-44 ml/min (HCC)  DM (diabetes mellitus) type II controlled with renal manifestation (HCC)  Cardiac amyloidosis (HCC)  Acute on chronic combined systolic and diastolic CHF (congestive heart failure) (HCC)  Elevated troponin  Lung nodule  Bladder wall thickening   I have reviewed the medical record, interviewed the patient and family, and examined the patient. The following aspects are pertinent.  Past Medical History:  Diagnosis Date   Bilateral cataracts    Cardiac amyloidosis (HCC) 07/29/2023   - TTE 09/11/22: Inferolateral HK, severe LVH with speckled pattern (consider amyloid), apical thrombus versus prominent trabeculae (consider CMR), EF 45-50, GR 2 DD, GLS -17.8, severely reduced RVSF, mild BAE, mild Steven, borderline dilation of ascending aorta (38 mm), RAP 3 - SPEP 06/2023 neg for monoclonal gammopathy - 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 05/2018 Echo: EF 60-65%, no rwma, GrI DD. Mildly dil Ao root. Mild BAE; b. 05/2022 Echo: EF 45-50%, inferior basal HK. Sev LVH, GrI DD, mildly enlarged RV w/ mildly reduced fxn. Trivm Steven. AoV sclerosis. Asc Ao 39mm.   Chest pain    a. 03/2007 MV: No isch/infarct; b. 04/2010 Lexi MV: EF 62%. No isch/infarct.   CKD (chronic kidney disease) stage 3, GFR 30-59 ml/min (HCC)    DM (diabetes mellitus) type II controlled with renal  manifestation (HCC)    Not on insulin    Essential hypertension    Glaucoma    Optho: Dr. Waylan   History of colonoscopy    Hyperlipidemia associated with type 2 diabetes mellitus (HCC)    Intolerant to statins --> because of elevated LFTs.   Obesity due to excess calories without serious comorbidity    BMI ~32 - mostly truncal obesity (mesomorphic)   Osteoarthritis of hip    Right > Left -- limits walking   RBBB (right bundle branch block)    Venous stasis dermatitis of both lower extremities - with edema    L leg worse than R 2/2 prior injury.; controlled somewhat with support hose   Social History   Socioeconomic History   Marital status: Married    Spouse name: Not on file   Number of children: 2   Years of education: Not on file  Highest education level: Not on file  Occupational History   Occupation: [employer]    Comment: self employed  Tobacco Use   Smoking status: Former    Current packs/day: 0.00    Types: Cigarettes    Quit date: 1980    Years since quitting: 45.0   Smokeless tobacco: Never   Tobacco comments:    quit 40 years ago  Vaping Use   Vaping status: Never Used  Substance and Sexual Activity   Alcohol  use: Not Currently    Comment: quit over 20 yr ago   Drug use: No   Sexual activity: Not Currently  Other Topics Concern   Not on file  Social History Narrative   He is married to his wife -Steven ([place].   Accompanied by his daughter.   Still works as a Paediatric Nurse    Slow decline in activity level - no routine exercise   Social Drivers of Corporate Investment Banker Strain: Not on file  Food Insecurity: No Food Insecurity (11/21/2023)   Hunger Vital Sign    Worried About Running Out of Food in the Last Year: Never true    Ran Out of Food in the Last Year: Never true  Transportation Needs: No Transportation Needs (11/21/2023)   PRAPARE - Administrator, Civil Service (Medical): No    Lack of Transportation (Non-Medical): No   Physical Activity: Not on file  Stress: Not on file  Social Connections: Patient Declined (11/22/2023)   Social Connection and Isolation Panel [NHANES]    Frequency of Communication with Friends and Family: Patient declined    Frequency of Social Gatherings with Friends and Family: Patient declined    Attends Religious Services: Patient declined    Database Administrator or Organizations: Patient declined    Attends Engineer, Structural: Patient declined    Marital Status: Patient declined   Family History  Problem Relation Age of Onset   Diabetes Mother    Heart disease Father 75   Colon cancer Neg Hx    Scheduled Meds:  aspirin  EC  81 mg Oral Daily   [START ON 11/25/2023] Chlorhexidine  Gluconate Cloth  6 each Topical Q0600   vitamin B-12  1,000 mcg Oral Daily   dorzolamide -timolol   1 drop Both Eyes BID   empagliflozin   10 mg Oral Daily   furosemide   40 mg Intravenous BID   heparin   5,000 Units Subcutaneous Q8H   insulin  aspart  0-5 Units Subcutaneous QHS   insulin  aspart  0-6 Units Subcutaneous TID WC   latanoprost   1 drop Both Eyes QHS   metoprolol  succinate  25 mg Oral Daily   sodium chloride  flush  3 mL Intravenous Q12H   [START ON 11/25/2023] torsemide   40 mg Oral Daily   Continuous Infusions: PRN Meds:.acetaminophen  **OR** acetaminophen , artificial tears Medications Prior to Admission:  Prior to Admission medications   Medication Sig Start Date End Date Taking? Authorizing Provider  amLODipine  (NORVASC ) 2.5 MG tablet Take 1 tablet (2.5 mg total) by mouth daily. 10/08/23 01/06/24 Yes Sabharwal, Aditya, DO  artificial tears (LACRILUBE) OINT ophthalmic ointment Place 1 Application into both eyes See admin instructions. Place as directed into both eyes as directed at bedtime- 5 minutes after Travoprost   Yes [provider]  Artificial Tears ophthalmic solution Place 1 drop into both eyes 3 (three) times daily.   Yes [provider]  aspirin  81 MG  tablet Take 81 mg by mouth daily.  Yes [provider]  brimonidine -timolol  (COMBIGAN ) 0.2-0.5 % ophthalmic solution Place 1 drop into both eyes in the morning and at bedtime.   Yes [provider]  empagliflozin  (JARDIANCE ) 10 MG TABS tablet Take 1 tablet (10 mg total) by mouth daily before breakfast. 08/11/23  Yes Weaver, Scott T, PA-C  ezetimibe  (ZETIA ) 10 MG tablet Take 10 mg by mouth daily. 03/21/18  Yes [provider]  latanoprost  (XALATAN ) 0.005 % ophthalmic solution Place 1 drop into both eyes at bedtime. Hasn't started using yet, finishing up the Travatan Z first. 11/09/23  Yes [provider]  metoprolol  succinate (TOPROL -XL) 100 MG 24 hr tablet take 1 tablet by mouth once daily 05/28/12  Yes McClung, Angela M, PA-C  RHOPRESSA 0.02 % SOLN Place 1 drop into both eyes at bedtime. 02/02/23  Yes [provider]  Tafamidis  Meglumine , Cardiac, (VYNDAQEL ) 20 MG CAPS Take 4 capsules (80 mg total) by mouth daily. 10/18/23  Yes Sabharwal, Aditya, DO  torsemide  (DEMADEX ) 20 MG tablet Take 1 tablet (20 mg total) by mouth daily. 08/11/23 11/21/23 Yes Weaver, Scott T, PA-C  trandolapril  (MAVIK ) 4 MG tablet Take 4 mg by mouth daily.   Yes [provider]  Travoprost, BAK Free, (TRAVATAN Z) 0.004 % SOLN ophthalmic solution Place 1 drop into both eyes at bedtime.   Yes [provider]   No Known Allergies Review of Systems  Unable to perform ROS: Mental status change    Physical Exam Constitutional:      Appearance: He is normal weight. He is ill-appearing.  Cardiovascular:     Rate and Rhythm: Normal rate.  Pulmonary:     Effort: Pulmonary effort is normal.  Musculoskeletal:     Comments: Generalized weakness  Skin:    General: Skin is warm and dry.  Neurological:     Mental Status: He is confused.     Vital Signs: BP (!) 147/113   Pulse 93   Temp 97.6 F (36.4 C) (Axillary)   Resp 20   Ht 6' (1.829 m)   Wt 84.8 kg   SpO2  100%   BMI 25.35 kg/m  Pain Scale: 0-10   Pain Score: 0-No pain   SpO2: SpO2: 100 % O2 Device:SpO2: 100 % O2 Flow Rate: .O2 Flow Rate (L/min): 6 L/min  IO: Intake/output summary:  Intake/Output Summary (Last 24 hours) at 11/24/2023 1433 Last data filed at 11/24/2023 1400 Gross per 24 hour  Intake 492.6 ml  Output 2675 ml  Net -2182.4 ml    LBM: Last BM Date : 11/22/23 Baseline Weight: Weight: 87.3 kg Most recent weight: Weight: 84.8 kg     Palliative Assessment/Data:  40 % at best    Time:  90 minutes   Signed by: Steven Plants, NP   Please contact Palliative Medicine Team phone at [phone] for questions and concerns.  For individual provider: See Tracey

## 2023-11-25 DIAGNOSIS — I5043 Acute on chronic combined systolic (congestive) and diastolic (congestive) heart failure: Secondary | ICD-10-CM | POA: Diagnosis not present

## 2023-11-25 DIAGNOSIS — I5023 Acute on chronic systolic (congestive) heart failure: Secondary | ICD-10-CM | POA: Diagnosis not present

## 2023-11-25 LAB — BASIC METABOLIC PANEL
Anion gap: 14 (ref 5–15)
BUN: 28 mg/dL — ABNORMAL HIGH (ref 8–23)
CO2: 24 mmol/L (ref 22–32)
Calcium: 9.1 mg/dL (ref 8.9–10.3)
Chloride: 103 mmol/L (ref 98–111)
Creatinine, Ser: 2.25 mg/dL — ABNORMAL HIGH (ref 0.61–1.24)
GFR, Estimated: 28 mL/min — ABNORMAL LOW (ref >60)
Glucose, Bld: 91 mg/dL (ref 70–99)
Potassium: 4.1 mmol/L (ref 3.5–5.1)
Sodium: 141 mmol/L (ref 135–145)

## 2023-11-25 LAB — GLUCOSE, CAPILLARY
Glucose-Capillary: 123 mg/dL — ABNORMAL HIGH (ref 70–99)
Glucose-Capillary: 137 mg/dL — ABNORMAL HIGH (ref 70–99)
Glucose-Capillary: 214 mg/dL — ABNORMAL HIGH (ref 70–99)
Glucose-Capillary: 103 mg/dL — ABNORMAL HIGH (ref 70–99)

## 2023-11-25 LAB — CBC
HCT: 53.8 % — ABNORMAL HIGH (ref 39.0–52.0)
Hemoglobin: 18.1 g/dL — ABNORMAL HIGH (ref 13.0–17.0)
MCH: 29 pg (ref 26.0–34.0)
MCHC: 33.6 g/dL (ref 30.0–36.0)
MCV: 86.2 fL (ref 80.0–100.0)
Platelets: 158 10*3/uL (ref 150–400)
RBC: 6.24 MIL/uL — ABNORMAL HIGH (ref 4.22–5.81)
RDW: 15.5 % (ref 11.5–15.5)
WBC: 5 10*3/uL (ref 4.0–10.5)
nRBC: 0 % (ref 0.0–0.2)

## 2023-11-25 LAB — MAGNESIUM: Magnesium: 2.1 mg/dL (ref 1.7–2.4)

## 2023-11-25 MED ORDER — TORSEMIDE 20 MG PO TABS
20.0000 mg | ORAL_TABLET | Freq: Every day | ORAL | Status: DC
  Administered 2023-11-25: 20 mg via ORAL
  Filled 2023-11-25: qty 1

## 2023-11-25 MED ORDER — TORSEMIDE 20 MG PO TABS: 20.0000 mg | ORAL_TABLET | Freq: Every day | ORAL | Status: DC

## 2023-11-25 MED ORDER — OLANZAPINE 5 MG PO TABS
5.0000 mg | ORAL_TABLET | Freq: Every day | ORAL | Status: DC
  Administered 2023-11-25: 5 mg via ORAL
  Filled 2023-11-25 (×2): qty 1

## 2023-11-25 MED ORDER — OXYCODONE HCL 5 MG PO TABS: 5.0000 mg | ORAL_TABLET | ORAL | Status: DC | PRN

## 2023-11-25 NOTE — Progress Notes (Addendum)
 Patient ID: Steven Myers, male   DOB: 25-Jul-1935, 89 y.o.   MRN: [id number]     Advanced Heart Failure Rounding Note  Cardiologist: Alm Clay, MD   Subjective:   Chief Complaint: Altered Mental Status  Transferred to floor. Hypertensive this morning SBP 150. Confused and combative overnight. Required IM Zyprexa  and restraints.   Calmer this morning. Oriented to self. Says he will get short of breath if he talks too much. Denies CP.   Objective:   Weight Range: 84.8 kg Body mass index is 25.35 kg/m.   Vital Signs:   Temp:  [97.3 F (36.3 C)-97.8 F (36.6 C)] 97.7 F (36.5 C) (01/02 0751) Pulse Rate:  [64-99] 96 (01/02 0751) Resp:  [20-25] 23 (01/02 0751) BP: (119-158)/(84-121) 150/121 (01/02 0751) SpO2:  [92 %-100 %] 93 % (01/02 0751) Last BM Date : 11/23/23  Weight change: Filed Weights   11/22/23 0500 11/23/23 0508 11/24/23 0500  Weight: 87.4 kg 82.4 kg 84.8 kg   Intake/Output:   Intake/Output Summary (Last 24 hours) at 11/25/2023 9177 Last data filed at 11/25/2023 0736 Gross per 24 hour  Intake --  Output 2550 ml  Net -2550 ml    Physical Exam    General: Confused appearing. No distress on RA HEENT: neck supple.   Cardiac: JVP ~10 cm w HJR. S1 and S2 present. No murmurs or rub. Resp: Lung sounds diminished throughout Abdomen: Soft, non-tender, non-distended. + BS. Extremities: Warm and dry. No rash, cyanosis.  1+ peripherial edema.  Neuro: Alert and oriented x3. Affect pleasant. Moves all extremities without difficulty.  Telemetry   ST in 100s w LBBB (personally reviewed)  EKG    No new EKG to review  Labs    CBC Recent Labs    11/24/23 0250 11/25/23 0322  WBC 6.4 5.0  HGB 17.4* 18.1*  HCT 52.4* 53.8*  MCV 87.2 86.2  PLT 177 158   Basic Metabolic Panel Recent Labs    98/98/74 0250 11/25/23 0322  NA 142 141  K 5.0 4.1  CL 102 103  CO2 28 24  GLUCOSE 132* 91  BUN 36* 28*  CREATININE 2.47* 2.25*  CALCIUM 9.3 9.1  MG 2.3 2.1    Liver Function Tests No results for input(s): AST, ALT, ALKPHOS, BILITOT, PROT, ALBUMIN in the last 72 hours.  No results for input(s): LIPASE, AMYLASE in the last 72 hours. Cardiac Enzymes No results for input(s): CKTOTAL, CKMB, CKMBINDEX, TROPONINI in the last 72 hours.  BNP: BNP (last 3 results) Recent Labs    10/08/23 1130 11/20/23 2300  BNP 771.0* 669.8*   ProBNP (last 3 results) No results for input(s): PROBNP in the last 8760 hours.  D-Dimer No results for input(s): DDIMER in the last 72 hours.  Hemoglobin A1C No results for input(s): HGBA1C in the last 72 hours. Fasting Lipid Panel No results for input(s): CHOL, HDL, LDLCALC, TRIG, CHOLHDL, LDLDIRECT in the last 72 hours. Thyroid  Function Tests No results for input(s): TSH, T4TOTAL, T3FREE, THYROIDAB in the last 72 hours.  Invalid input(s): FREET3  Other results:  Imaging   No results found.  Medications:    Scheduled Medications:  aspirin  EC  81 mg Oral Daily   Chlorhexidine  Gluconate Cloth  6 each Topical Q0600   vitamin B-12  1,000 mcg Oral Daily   dorzolamide -timolol   1 drop Both Eyes BID   empagliflozin   10 mg Oral Daily   heparin   5,000 Units Subcutaneous Q8H   insulin  aspart  0-5 Units Subcutaneous  QHS   insulin  aspart  0-6 Units Subcutaneous TID WC   latanoprost   1 drop Both Eyes QHS   metoprolol  succinate  25 mg Oral Daily   sodium chloride  flush  3 mL Intravenous Q12H   torsemide   40 mg Oral Daily    Infusions:    PRN Medications: acetaminophen  **OR** acetaminophen , artificial tears, OLANZapine   Patient Profile   Kreston Ahrendt is a 89 y.o. male with HFrEF, cardiac amyloid, T2DM, HLD, obesity, HTN & CKD III. Admitted with AMS and volume overload.  Assessment/Plan   1. 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  PYP scan from 8/24 highly suggestive of ATTR cardiac amyloidosis, he was  started on tafamidis .  Echo this admission showed EF < 20%, severe LVH, severe RV dysfunction with moderate RV enlargement and RVH, IVC dilated. He was admitted with low output HF (lactate 3.2) with volume overload. Initially started on milrinone  empirically with elevated lactate and PICC placed.  Hypotensive after Bidil  dose 12/31, then NE started/milrinone  stopped. Now off NE with stable BP, SBP 130s.  Appears volume overloaded on exam.  Creatinine up to 2.47> 2.25 with diuresis.  - With hypotensive episode and creatinine up, we are going to be significantly limited in terms of GDMT.  Also, cardiac amyloidosis patients tend to tolerate GDMT poorly.  - Start Torsemide  40 mg qam and 20 mg qpm - Continue his tafamidis .  - Continue Toprol  XL 25 daily - Continue Jardiance  10 mg daily.  - Not candidate for advanced therapies with age and renal dysfunction.  I suspect he has end stage heart failure due to cardiac amyloidosis. Palliative care consult recommended for goals of care, current DNR.  - Consider genetic testing for hATTR as outpatient.  2. CKD stage IIIb: Baseline creatinine 1.7-1.9.  2.47=>2.25 with diuresis. 3. HTN: SBP 130s. Avoid meds for now with hypotensive episode.  4. DM2: Per primary.  5. Fe deficiency Anemia: Ferritin 38, Tsat 19 - Received IV Fe  Referrals sent out for SNF.   Hemodynamically stable and I do not think that I have much more to offer from a cardiac standpoint.  Suspect end-stage cardiac amyloidosis with markedly low LV/RV function.  Would be appropriate for hospice care I think.   Length of Stay: 4  Jordan Lee, NP  11/25/2023, 8:22 AM  Advanced Heart Failure Team Pager [phone] (M-F; 7a - 5p)  Please contact [facility] for night-coverage after hours (5p -7a ) and weekends on [email or web address]  Agree with the above NP note.    Would start torsemide  40 qam/20 qpm.  Continue Jardiance  and Toprol  XL.  Will not push GDMT aggressively with end stage cardiac amyloidosis  and hypotension with low dose Bidil .   Working towards SNF.    Cardiology to follow at a distance, call with questions.   Ezra Shuck 11/25/2023 1:24 PM

## 2023-11-25 NOTE — Progress Notes (Signed)
 Patient is confused and combative, will not allow staff to approach him for any care, threatening violence and wielding the call bell at staff. Attempting to exit the bed and room. Paging physician for restraints.

## 2023-11-25 NOTE — Progress Notes (Signed)
 Spoke with patient's spouse to notify of our need to place the patient in soft restraints. Mrs. Steven Myers says patient has been confused with intermittent agitation for  quite a while now. She states he often says he wants to go home when he is in fact already home. Mrs. Steven Myers wants to know if we will try oral medication that will help control his mood and behavior, I advised her that other medicinal interventions were employed prior to the restraints, however there was only a minimum affect. The on-call elink physician has advise they medical team with be evaluate for additional interventions to progress him out of the restraints. Mrs. Steven Myers was very grateful for our care and attention to her husband and will try to visit today if she can get transportation to the hospital.

## 2023-11-25 NOTE — Progress Notes (Addendum)
 Obtained Verbal order from dr. Katrinka Blazing for d/c restraint.   Lawson Radar, RN

## 2023-11-25 NOTE — Progress Notes (Signed)
 Physician has ordered restraints and will evaluate patient for medicinal interventions to be added to his treatment plan. Will continue to reorient and monitor patient.

## 2023-11-25 NOTE — Progress Notes (Signed)
 Physical Therapy Treatment Patient Details Name: Steven Myers MRN: [id number] DOB: [birth year] Today's Date: 11/25/2023   History of Present Illness Patient is 89 y.o. male presented with SOB, swelling, and altered mental status. PMH includes NICM, CKD 3, DM, HTN, glaucoma, cataracts, OA.    PT Comments  Pt is slowly progressing towards goals. Pt required increased assistance for bed mobility and sit to stand this session but was able to increase gait distance with good steps utilizing repetitive verbal input. Pt was Mod A for bed mobility and Min A +2 for sit to stand and gait. Pt has increased difficulty stepping with turns and floor transitions. Due to pt current functional status, home set up and available assistance at home recommending skilled physical therapy services < 3 hours/day in order to address strength, balance and functional mobility to decrease risk for falls, injury, immobility, skin break down and re-hospitalization.      If plan is discharge home, recommend the following: A little help with walking and/or transfers;A little help with bathing/dressing/bathroom;Assistance with cooking/housework;Assist for transportation;Help with stairs or ramp for entrance     Equipment Recommendations  None recommended by PT       Precautions / Restrictions Precautions Precautions: Fall Restrictions Weight Bearing Restrictions Per Provider Order: No     Mobility  Bed Mobility Overal bed mobility: Needs Assistance Bed Mobility: Supine to Sit     Supine to sit: Mod assist     General bed mobility comments: Mod A and extra time for progression of LE toward EOB, progression of trunk to mid line and scooting to EOB    Transfers Overall transfer level: Needs assistance Equipment used: Rolling walker (2 wheels) Transfers: Sit to/from Stand Sit to Stand: Min assist, +2 physical assistance           General transfer comment: Min A +2 for physical assistance for stability from  EOB at lowest position with multi modal cueing for body mechanics and hand placement.    Ambulation/Gait Ambulation/Gait assistance: Min assist, +2 safety/equipment Gait Distance (Feet): 80 Feet Assistive device: Rolling walker (2 wheels) Gait Pattern/deviations: Step-through pattern, Decreased stride length, Trunk flexed, Shuffle, Step-to pattern Gait velocity: decr Gait velocity interpretation: <1.31 ft/sec, indicative of household ambulator   General Gait Details: cues for proximity to RW at start and intermittently throughout ; min assist to steady and mange walker with turning. Verbal cues for stepping due to short shufling gait pattern; improved with constant verbal input with intermittent change in rhythm to reset pt would go from barely step to to step through gait pattern which got worse during turns and at floor transitions.      Balance Overall balance assessment: Needs assistance Sitting-balance support: Feet supported, No upper extremity supported Sitting balance-Leahy Scale: Fair Sitting balance - Comments: initially required Min A to maintain trunk mid line. Progressed to SBA   Standing balance support: No upper extremity supported, Bilateral upper extremity supported, During functional activity, Reliant on assistive device for balance Standing balance-Leahy Scale: Poor        Cognition Arousal: Alert Behavior During Therapy: WFL for tasks assessed/performed Overall Cognitive Status: History of cognitive impairments - at baseline     General Comments: hx of dementia; pleasant and agreeable           General Comments General comments (skin integrity, edema, etc.): Daughter present through session      Pertinent Vitals/Pain Pain Assessment Pain Assessment: No/denies pain (states sometimes he has pain in his back)  PT Goals (current goals can now be found in the care plan section) Acute Rehab PT Goals Patient Stated Goal: none stated by pt, return  home PT Goal Formulation: With patient/family Time For Goal Achievement: 12/07/23 Potential to Achieve Goals: Good Progress towards PT goals: Progressing toward goals    Frequency    Min 1X/week      PT Plan  Continue with current POC        AM-PAC PT 6 Clicks Mobility   Outcome Measure  Help needed turning from your back to your side while in a flat bed without using bedrails?: A Little Help needed moving from lying on your back to sitting on the side of a flat bed without using bedrails?: A Lot Help needed moving to and from a bed to a chair (including a wheelchair)?: A Lot Help needed standing up from a chair using your arms (e.g., wheelchair or bedside chair)?: A Lot Help needed to walk in hospital room?: Total Help needed climbing 3-5 steps with a railing? : Total 6 Click Score: 11    End of Session Equipment Utilized During Treatment: Gait belt Activity Tolerance: Patient tolerated treatment well Patient left: in bed;with call bell/phone within reach;with family/visitor present Nurse Communication: Mobility status PT Visit Diagnosis: Other abnormalities of gait and mobility (R26.89);Muscle weakness (generalized) (M62.81);Difficulty in walking, not elsewhere classified (R26.2)     Time: 8779-8766 PT Time Calculation (min) (ACUTE ONLY): 13 min  Charges:    $Gait Training: 8-22 mins PT General Charges $$ ACUTE PT VISIT: 1 Visit                     Dorothyann Maier, DPT, CLT  Acute Rehabilitation Services Office: [phone] (Secure chat preferred)    Dorothyann VEAR Maier 11/25/2023, 1:15 PM

## 2023-11-25 NOTE — TOC Progression Note (Addendum)
 Transition of Care [facility]) - Progression Note    Patient Details  Name: Steven Myers MRN: [id number] Date of Birth: 11/27/1934  [facility]) CM/SW Contact  [name] Phone Number: [phone] 11/25/2023, 2:51 PM  Clinical Narrative:   HF CSW met with pts wife outside of the room to discuss SNFs. CSW explained the process and went through the pts accepted SNF list. Pts wife stated that she was most interested in [facility] because it was close to her home and most convenient for the family. CSW explained that the insurance auth would not be started until this pt is medically ready for dc.   CSW called [facility] to confirm if they had a bed available. Ciara in admissions stated that they do.   TOC will continue following.     Expected Discharge Plan: Home w Home Health Services Barriers to Discharge: Continued Medical Work up  Expected Discharge Plan and Services   Discharge Planning Services: CM Consult   Living arrangements for the past 2 months: Single Family Home                                       Social Determinants of Health (SDOH) Interventions SDOH Screenings   Food Insecurity: No Food Insecurity (11/21/2023)  Housing: Low Risk  (11/21/2023)  Transportation Needs: No Transportation Needs (11/21/2023)  Utilities: Not At Risk (11/21/2023)  Social Connections: Patient Declined (11/22/2023)  Tobacco Use: Medium Risk (11/21/2023)    Readmission Risk Interventions     No data to display

## 2023-11-25 NOTE — Progress Notes (Signed)
 NAME:  Steven Myers, MRN:  [id number], DOB:  Mar 18, 1935, LOS: 4 ADMISSION DATE:  11/20/2023, CONSULTATION DATE:  11/23/23 REFERRING MD:  Fairy, CHIEF COMPLAINT:  SOB   History of Present Illness:  89 year old man with history of cardiac amyloid, CKD 3b, DM2 presenting with decompensated systolic heart failure.  Inotropes and diuresis were being used to offload fluid.  However this am he became less responsive and Bps noted to be markedly low.  Transferred urgently to ICU with no chest compressions needed; started on levophed  and started to become more responsive.  PCCM to assume care now that he's in ICU.  Patient currently denies complaints, breathing has improved.  He is needing a fair bit of O2.  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 05/2018 Echo: EF 60-65%, no rwma, GrI DD. Mildly dil Ao root. Mild BAE; b. 05/2022 Echo: EF 45-50%, inferior basal HK. Sev LVH, GrI DD, mildly enlarged RV w/ mildly reduced fxn. Trivm MR. AoV sclerosis. Asc Ao 39mm.   Chest pain    a. 03/2007 MV: No isch/infarct; b. 04/2010 Lexi MV: EF 62%. No isch/infarct.   CKD (chronic kidney disease) stage 3, GFR 30-59 ml/min (HCC)    DM (diabetes mellitus) type II controlled with renal manifestation (HCC)    Not on insulin    Essential hypertension    Glaucoma    Optho: Dr. Waylan   History of colonoscopy    Hyperlipidemia associated with type 2 diabetes mellitus (HCC)    Intolerant to statins --> because of elevated LFTs.   Obesity due to  excess calories without serious comorbidity    BMI ~32 - mostly truncal obesity (mesomorphic)   Osteoarthritis of hip    Right > Left -- limits walking   RBBB (right bundle branch block)    Venous stasis dermatitis of both lower extremities - with edema    L leg worse than R 2/2 prior injury.; controlled somewhat with support hose     Significant Hospital Events: Including procedures, antibiotic start and stop dates in addition to other pertinent events   12/29 admit 12/31 near-code and ICU admit  Interim History / Subjective:  Ripped out PICC and took off his [place] Wants to go home Son/daughter at bedside Vitals look pretty good off pressors and O2  Objective   Blood pressure (!) 150/121, pulse 96, temperature 97.7 F (36.5 C), temperature source Oral, resp. rate (!) 23, height 6' (1.829 m), weight 84.8 kg, SpO2 93%.        Intake/Output Summary (Last 24 hours) at 11/25/2023 1149 Last data filed at 11/25/2023 0900 Gross per 24 hour  Intake 240 ml  Output 2550 ml  Net -2310 ml   Filed Weights   11/22/23 0500 11/23/23 0508 11/24/23 0500  Weight: 87.4 kg 82.4 kg 84.8 kg    Examination:  General: Lying in bed no acute distress is a little fatigued he does walk with physical therapy HEENT normocephalic atraumatic no jugular venous distention appreciated mucous membranes are  moist Pulmonary: Clear to auscultation diminished in the bases no accessory use currently but does seem to get a little more short of breath while talking currently on room air Cardiac regular rate and rhythm Abdomen: Soft not tender Extremities: Warm dry chronic venous stasis changes some mild edema Neuro: Awake confused cooperative moves all extremities Resolved Hospital Problem list   Shock. Felt medication related resolved   Assessment & Plan:   End stage amyloid cardiomyopathy with acute on chronic heart failure EF <20% w/ biventricular dysfxn Hypertension  -followed by HF team. Initially treated  w/ inotropic support, then required norepi and milrinone  stopped due to hypotension felt 2/2 bidil  -options limited for GDMT per HF team and given renal failure not candidate for advanced rx  Plan Cont toprol  xl 25 mg/d Started toresemide today 40 mg am and 20 mg pm Cont tafamidis  and Jardiance    AKI on CKD (stage IIIB) Scr normalizing Plan Rx as above  Will need f/u chemistry   Probable dementia w/ witnessed episodes of sundowning while in hospital X 3. Got Zyprexa  Plan Will start low dose PM zyprexa   Needs to be at least 24 hrs out of restraints for dc  Will check QTc in the morning  DM2 Plan Diet control Also on Jardiance    FeSO4 def anemia Plan F/u

## 2023-11-25 NOTE — Progress Notes (Signed)
 Tonight as last night patient is confused and agitated; has refused assessment and ordered heparin  injection. Patient's daughter has agreed to stay overnight with patient to help patient orienting and staff's care needs for the patient.  Noted daughter also has difficulty working with the patient.

## 2023-11-26 DIAGNOSIS — D61818 Other pancytopenia: Secondary | ICD-10-CM | POA: Diagnosis not present

## 2023-11-26 DIAGNOSIS — R059 Cough, unspecified: Secondary | ICD-10-CM | POA: Diagnosis not present

## 2023-11-26 DIAGNOSIS — L97218 Non-pressure chronic ulcer of right calf with other specified severity: Secondary | ICD-10-CM | POA: Diagnosis not present

## 2023-11-26 DIAGNOSIS — J9601 Acute respiratory failure with hypoxia: Secondary | ICD-10-CM | POA: Diagnosis present

## 2023-11-26 DIAGNOSIS — R739 Hyperglycemia, unspecified: Secondary | ICD-10-CM | POA: Diagnosis not present

## 2023-11-26 DIAGNOSIS — E1169 Type 2 diabetes mellitus with other specified complication: Secondary | ICD-10-CM | POA: Diagnosis present

## 2023-11-26 DIAGNOSIS — G3184 Mild cognitive impairment, so stated: Secondary | ICD-10-CM | POA: Diagnosis not present

## 2023-11-26 DIAGNOSIS — Z66 Do not resuscitate: Secondary | ICD-10-CM | POA: Diagnosis not present

## 2023-11-26 DIAGNOSIS — I70232 Atherosclerosis of native arteries of right leg with ulceration of calf: Secondary | ICD-10-CM | POA: Diagnosis not present

## 2023-11-26 DIAGNOSIS — N184 Chronic kidney disease, stage 4 (severe): Secondary | ICD-10-CM | POA: Diagnosis not present

## 2023-11-26 DIAGNOSIS — R4182 Altered mental status, unspecified: Secondary | ICD-10-CM | POA: Diagnosis not present

## 2023-11-26 DIAGNOSIS — M6281 Muscle weakness (generalized): Secondary | ICD-10-CM | POA: Diagnosis not present

## 2023-11-26 DIAGNOSIS — I214 Non-ST elevation (NSTEMI) myocardial infarction: Secondary | ICD-10-CM | POA: Diagnosis not present

## 2023-11-26 DIAGNOSIS — H44519 Absolute glaucoma, unspecified eye: Secondary | ICD-10-CM | POA: Diagnosis not present

## 2023-11-26 DIAGNOSIS — N1832 Chronic kidney disease, stage 3b: Secondary | ICD-10-CM | POA: Diagnosis not present

## 2023-11-26 DIAGNOSIS — R001 Bradycardia, unspecified: Secondary | ICD-10-CM | POA: Diagnosis present

## 2023-11-26 DIAGNOSIS — E162 Hypoglycemia, unspecified: Secondary | ICD-10-CM | POA: Diagnosis not present

## 2023-11-26 DIAGNOSIS — R627 Adult failure to thrive: Secondary | ICD-10-CM | POA: Diagnosis not present

## 2023-11-26 DIAGNOSIS — I5084 End stage heart failure: Secondary | ICD-10-CM | POA: Diagnosis not present

## 2023-11-26 DIAGNOSIS — R531 Weakness: Secondary | ICD-10-CM | POA: Diagnosis not present

## 2023-11-26 DIAGNOSIS — Z7401 Bed confinement status: Secondary | ICD-10-CM | POA: Diagnosis not present

## 2023-11-26 DIAGNOSIS — G309 Alzheimer's disease, unspecified: Secondary | ICD-10-CM | POA: Diagnosis not present

## 2023-11-26 DIAGNOSIS — E785 Hyperlipidemia, unspecified: Secondary | ICD-10-CM | POA: Diagnosis not present

## 2023-11-26 DIAGNOSIS — I110 Hypertensive heart disease with heart failure: Secondary | ICD-10-CM | POA: Diagnosis not present

## 2023-11-26 DIAGNOSIS — I469 Cardiac arrest, cause unspecified: Secondary | ICD-10-CM | POA: Diagnosis not present

## 2023-11-26 DIAGNOSIS — Z9911 Dependence on respirator [ventilator] status: Secondary | ICD-10-CM | POA: Diagnosis not present

## 2023-11-26 DIAGNOSIS — R0689 Other abnormalities of breathing: Secondary | ICD-10-CM | POA: Diagnosis not present

## 2023-11-26 DIAGNOSIS — I5022 Chronic systolic (congestive) heart failure: Secondary | ICD-10-CM | POA: Diagnosis not present

## 2023-11-26 DIAGNOSIS — R61 Generalized hyperhidrosis: Secondary | ICD-10-CM | POA: Diagnosis not present

## 2023-11-26 DIAGNOSIS — I5043 Acute on chronic combined systolic (congestive) and diastolic (congestive) heart failure: Secondary | ICD-10-CM | POA: Diagnosis not present

## 2023-11-26 DIAGNOSIS — J9811 Atelectasis: Secondary | ICD-10-CM | POA: Diagnosis present

## 2023-11-26 DIAGNOSIS — F03918 Unspecified dementia, unspecified severity, with other behavioral disturbance: Secondary | ICD-10-CM | POA: Diagnosis present

## 2023-11-26 DIAGNOSIS — R578 Other shock: Secondary | ICD-10-CM | POA: Diagnosis present

## 2023-11-26 DIAGNOSIS — R7881 Bacteremia: Secondary | ICD-10-CM | POA: Diagnosis present

## 2023-11-26 DIAGNOSIS — I517 Cardiomegaly: Secondary | ICD-10-CM | POA: Diagnosis not present

## 2023-11-26 DIAGNOSIS — Z1152 Encounter for screening for COVID-19: Secondary | ICD-10-CM | POA: Diagnosis not present

## 2023-11-26 DIAGNOSIS — G9340 Encephalopathy, unspecified: Secondary | ICD-10-CM | POA: Diagnosis not present

## 2023-11-26 DIAGNOSIS — R918 Other nonspecific abnormal finding of lung field: Secondary | ICD-10-CM | POA: Diagnosis not present

## 2023-11-26 DIAGNOSIS — I771 Stricture of artery: Secondary | ICD-10-CM | POA: Diagnosis not present

## 2023-11-26 DIAGNOSIS — E1122 Type 2 diabetes mellitus with diabetic chronic kidney disease: Secondary | ICD-10-CM | POA: Diagnosis present

## 2023-11-26 DIAGNOSIS — F05 Delirium due to known physiological condition: Secondary | ICD-10-CM | POA: Diagnosis not present

## 2023-11-26 DIAGNOSIS — R911 Solitary pulmonary nodule: Secondary | ICD-10-CM | POA: Diagnosis not present

## 2023-11-26 DIAGNOSIS — R402431 Glasgow coma scale score 3-8, in the field [EMT or ambulance]: Secondary | ICD-10-CM | POA: Diagnosis not present

## 2023-11-26 DIAGNOSIS — I87323 Chronic venous hypertension (idiopathic) with inflammation of bilateral lower extremity: Secondary | ICD-10-CM | POA: Diagnosis not present

## 2023-11-26 DIAGNOSIS — R55 Syncope and collapse: Secondary | ICD-10-CM | POA: Diagnosis not present

## 2023-11-26 DIAGNOSIS — Z515 Encounter for palliative care: Secondary | ICD-10-CM | POA: Diagnosis not present

## 2023-11-26 DIAGNOSIS — G9341 Metabolic encephalopathy: Secondary | ICD-10-CM | POA: Diagnosis present

## 2023-11-26 DIAGNOSIS — E1129 Type 2 diabetes mellitus with other diabetic kidney complication: Secondary | ICD-10-CM | POA: Diagnosis not present

## 2023-11-26 DIAGNOSIS — I43 Cardiomyopathy in diseases classified elsewhere: Secondary | ICD-10-CM | POA: Diagnosis not present

## 2023-11-26 DIAGNOSIS — Z4682 Encounter for fitting and adjustment of non-vascular catheter: Secondary | ICD-10-CM | POA: Diagnosis not present

## 2023-11-26 DIAGNOSIS — I130 Hypertensive heart and chronic kidney disease with heart failure and stage 1 through stage 4 chronic kidney disease, or unspecified chronic kidney disease: Secondary | ICD-10-CM | POA: Diagnosis not present

## 2023-11-26 DIAGNOSIS — I462 Cardiac arrest due to underlying cardiac condition: Secondary | ICD-10-CM | POA: Diagnosis present

## 2023-11-26 DIAGNOSIS — I5082 Biventricular heart failure: Secondary | ICD-10-CM | POA: Diagnosis not present

## 2023-11-26 DIAGNOSIS — E872 Acidosis, unspecified: Secondary | ICD-10-CM | POA: Diagnosis not present

## 2023-11-26 DIAGNOSIS — I4510 Unspecified right bundle-branch block: Secondary | ICD-10-CM | POA: Diagnosis not present

## 2023-11-26 DIAGNOSIS — N179 Acute kidney failure, unspecified: Secondary | ICD-10-CM | POA: Diagnosis not present

## 2023-11-26 DIAGNOSIS — R579 Shock, unspecified: Secondary | ICD-10-CM | POA: Diagnosis not present

## 2023-11-26 DIAGNOSIS — I10 Essential (primary) hypertension: Secondary | ICD-10-CM | POA: Diagnosis not present

## 2023-11-26 DIAGNOSIS — R7989 Other specified abnormal findings of blood chemistry: Secondary | ICD-10-CM | POA: Diagnosis not present

## 2023-11-26 DIAGNOSIS — E11649 Type 2 diabetes mellitus with hypoglycemia without coma: Secondary | ICD-10-CM | POA: Diagnosis not present

## 2023-11-26 DIAGNOSIS — R569 Unspecified convulsions: Secondary | ICD-10-CM | POA: Diagnosis not present

## 2023-11-26 DIAGNOSIS — R41841 Cognitive communication deficit: Secondary | ICD-10-CM | POA: Diagnosis not present

## 2023-11-26 DIAGNOSIS — F02C0 Dementia in other diseases classified elsewhere, severe, without behavioral disturbance, psychotic disturbance, mood disturbance, and anxiety: Secondary | ICD-10-CM | POA: Diagnosis not present

## 2023-11-26 DIAGNOSIS — R000 Tachycardia, unspecified: Secondary | ICD-10-CM | POA: Diagnosis not present

## 2023-11-26 DIAGNOSIS — D696 Thrombocytopenia, unspecified: Secondary | ICD-10-CM | POA: Diagnosis not present

## 2023-11-26 DIAGNOSIS — E119 Type 2 diabetes mellitus without complications: Secondary | ICD-10-CM | POA: Diagnosis not present

## 2023-11-26 DIAGNOSIS — N3289 Other specified disorders of bladder: Secondary | ICD-10-CM | POA: Diagnosis not present

## 2023-11-26 DIAGNOSIS — E854 Organ-limited amyloidosis: Secondary | ICD-10-CM | POA: Diagnosis not present

## 2023-11-26 DIAGNOSIS — I38 Endocarditis, valve unspecified: Secondary | ICD-10-CM | POA: Diagnosis not present

## 2023-11-26 DIAGNOSIS — R9082 White matter disease, unspecified: Secondary | ICD-10-CM | POA: Diagnosis not present

## 2023-11-26 DIAGNOSIS — R109 Unspecified abdominal pain: Secondary | ICD-10-CM | POA: Diagnosis not present

## 2023-11-26 DIAGNOSIS — R296 Repeated falls: Secondary | ICD-10-CM | POA: Diagnosis not present

## 2023-11-26 DIAGNOSIS — E440 Moderate protein-calorie malnutrition: Secondary | ICD-10-CM | POA: Diagnosis present

## 2023-11-26 DIAGNOSIS — R404 Transient alteration of awareness: Secondary | ICD-10-CM | POA: Diagnosis not present

## 2023-11-26 DIAGNOSIS — I5042 Chronic combined systolic (congestive) and diastolic (congestive) heart failure: Secondary | ICD-10-CM | POA: Diagnosis not present

## 2023-11-26 DIAGNOSIS — I509 Heart failure, unspecified: Secondary | ICD-10-CM | POA: Diagnosis not present

## 2023-11-26 DIAGNOSIS — I21A1 Myocardial infarction type 2: Secondary | ICD-10-CM | POA: Diagnosis not present

## 2023-11-26 LAB — BASIC METABOLIC PANEL
Anion gap: 14 (ref 5–15)
BUN: 34 mg/dL — ABNORMAL HIGH (ref 8–23)
CO2: 25 mmol/L (ref 22–32)
Calcium: 9.4 mg/dL (ref 8.9–10.3)
Chloride: 102 mmol/L (ref 98–111)
Creatinine, Ser: 2.56 mg/dL — ABNORMAL HIGH (ref 0.61–1.24)
GFR, Estimated: 24 mL/min — ABNORMAL LOW (ref >60)
Glucose, Bld: 96 mg/dL (ref 70–99)
Potassium: 4.4 mmol/L (ref 3.5–5.1)
Sodium: 141 mmol/L (ref 135–145)

## 2023-11-26 LAB — GLUCOSE, CAPILLARY
Glucose-Capillary: 130 mg/dL — ABNORMAL HIGH (ref 70–99)
Glucose-Capillary: 111 mg/dL — ABNORMAL HIGH (ref 70–99)

## 2023-11-26 LAB — CBC
HCT: 58.6 % — ABNORMAL HIGH (ref 39.0–52.0)
Hemoglobin: 19.3 g/dL — ABNORMAL HIGH (ref 13.0–17.0)
MCH: 28.8 pg (ref 26.0–34.0)
MCHC: 32.9 g/dL (ref 30.0–36.0)
MCV: 87.5 fL (ref 80.0–100.0)
Platelets: 159 10*3/uL (ref 150–400)
RBC: 6.7 MIL/uL — ABNORMAL HIGH (ref 4.22–5.81)
RDW: 16.8 % — ABNORMAL HIGH (ref 11.5–15.5)
WBC: 5.2 10*3/uL (ref 4.0–10.5)
nRBC: 0 % (ref 0.0–0.2)

## 2023-11-26 LAB — MAGNESIUM: Magnesium: 2.2 mg/dL (ref 1.7–2.4)

## 2023-11-26 MED ORDER — METOPROLOL SUCCINATE ER 25 MG PO TB24: 25.0000 mg | ORAL_TABLET | Freq: Every day | ORAL | Status: DC

## 2023-11-26 MED ORDER — OLANZAPINE 5 MG PO TABS: ORAL_TABLET | ORAL | Status: DC

## 2023-11-26 MED ORDER — POTASSIUM CHLORIDE CRYS ER 20 MEQ PO TBCR: 20.0000 meq | EXTENDED_RELEASE_TABLET | Freq: Every day | ORAL | Status: DC

## 2023-11-26 MED ORDER — CYANOCOBALAMIN 1000 MCG PO TABS: 1000.0000 ug | ORAL_TABLET | Freq: Every day | ORAL | Status: DC

## 2023-11-26 MED ORDER — TORSEMIDE 20 MG PO TABS: ORAL_TABLET | ORAL | Status: DC

## 2023-11-26 NOTE — Progress Notes (Signed)
 Patient discharged to [facility], attempted to call report to receiving nurse but nurse was at lunch during this time. Name and number left for return call.

## 2023-11-26 NOTE — TOC Progression Note (Addendum)
 Transition of Care [facility]) - Progression Note    Patient Details  Name: Steven Myers MRN: [id number] Date of Birth: 1935/11/07  Transition of Care [facility]) CM/SW Contact  [name] Phone Number: [phone] 11/26/2023, 12:05 PM  Clinical Narrative:   HF CSW called to speak with Ciara in admissions to see why the facility switched to declining the pt. CSW left a VM with Tiffany asking for Ciara to call back.   CSW called the pts spouse and their daughter, Arland answered the phone. Pts daughter stated that she is at a doctors appointment with her mother and she would have her call me back to  discuss alternative SNF options. CSW explained that we were about to start ins. Auth for SNF but recently changed to decline in the hub. Asked if the family had any additional facilities they were interested in. Family stated that they will discuss and call me back.   CSW called and spoke with pts son, Zach. CSW informed pts son of the recent changes. Pt son stated that if they could not get into [facility] which is their first choice they would like to move forward with [facility], [facility], and then [facility].   CSW called and left a VM with Tiffany at [facility] asking to be called back. CSW called Randine [phone]  at [facility] to inquire about pending offer. Randine stated that she would call CSW back shortly. [facility] declined.   TOC will continue following.     Expected Discharge Plan: Home w Home Health Services Barriers to Discharge: Continued Medical Work up  Expected Discharge Plan and Services   Discharge Planning Services: CM Consult   Living arrangements for the past 2 months: Single Family Home                                       Social Determinants of Health (SDOH) Interventions SDOH Screenings   Food Insecurity: No Food Insecurity (11/21/2023)  Housing: Low Risk  (11/21/2023)  Transportation Needs: No Transportation Needs (11/21/2023)  Utilities: Not At Risk  (11/21/2023)  Social Connections: Patient Declined (11/22/2023)  Tobacco Use: Medium Risk (11/21/2023)    Readmission Risk Interventions     No data to display

## 2023-11-26 NOTE — Consult Note (Signed)
 [facility] Liaison Consult Note    11/26/2023  Steven Myers 12/24/1934 [id number]  [facility] Consult   Primary Care Provider:  Rexanne Ingle, MD is with [name] at [facility]: Medicare ACO Reach    Reviewed for disposition and barriers to post hospital care needs  Patient was screened for hospitalization and on behalf of [facility]  Care Coordination to assess for post hospital community care needs.  Patient is being considered for a skilled nursing facility level of care for post hospital transition.  If the patient goes to a [facility] /[facility] affiliated facility then, patient can be followed by [facility] RN with traditional Medicare and approved Medicare Advantage plans.    Plan:  If transitions to affiliated facility, then will notify the Community [facility] RN can follow for any known or needs for transitional care needs for returning to post facility care coordination needs to return to community to update [facility] transition team of any return to community follow up.. Patient to [place] noted.  For questions or referrals, please contact:  [name], RN, BSN, CCM [place]  [facility], [facility] Liaison Direct Dial: [phone] or secure chat Email: [name]

## 2023-11-26 NOTE — Progress Notes (Signed)
 Patient with pain level of 7 of 10, however the patient's daughter only wants him to have tylenol and not the ordered oxycodone for severe pain. Will continue to monitor.

## 2023-11-26 NOTE — Progress Notes (Signed)
 Occupational Therapy Treatment Patient Details Name: Steven Myers MRN: [id number] DOB: 18-Sep-1935 Today's Date: 11/26/2023   History of present illness Patient is 89 y.o. male presented with SOB, swelling, and altered mental status. PMH includes NICM, CKD 3, DM, HTN, glaucoma, cataracts, OA.   OT comments  Pt alert, able to fully participate in therapy, notable confusion throughout session, required frequent verbal cues for orientation and safety. Pt able to participate in bedside ADLs and transfer to recliner with min A, follows simple commands fairly consistently. Pt was pulling at IV bandage, even after multiple cues to leave alone, redressed bandages and Pt stopped. Lunch placed in front of Pt, chair alarm set, Pt requires set up for feeding, difficulty initiating feeding without cues. Pt would benefit from continued acute OT to maximize participation and safety, DC to postacute rehab <3hrs/day appropriate.      If plan is discharge home, recommend the following:  Assistance with cooking/housework;Direct supervision/assist for medications management;Direct supervision/assist for financial management;Assist for transportation;Help with stairs or ramp for entrance;Supervision due to cognitive status   Equipment Recommendations  None recommended by OT    Recommendations for Other Services      Precautions / Restrictions Precautions Precautions: Fall Restrictions Weight Bearing Restrictions Per Provider Order: No       Mobility Bed Mobility Overal bed mobility: Needs Assistance Bed Mobility: Supine to Sit     Supine to sit: Min assist, HOB elevated, Used rails     General bed mobility comments: min A to power sitting up    Transfers Overall transfer level: Needs assistance Equipment used: Rolling walker (2 wheels) Transfers: Sit to/from Stand, Bed to chair/wheelchair/BSC Sit to Stand: Min assist, From elevated surface     Step pivot transfers: Contact guard assist,  Supervision, From elevated surface     General transfer comment: min A from elevated surface, cueing for safety     Balance Overall balance assessment: Needs assistance Sitting-balance support: Feet supported, No upper extremity supported Sitting balance-Leahy Scale: Fair Sitting balance - Comments: EOB ADLs   Standing balance support: Bilateral upper extremity supported, During functional activity, Reliant on assistive device for balance Standing balance-Leahy Scale: Poor                             ADL either performed or assessed with clinical judgement   ADL Overall ADL's : Needs assistance/impaired Eating/Feeding: Set up;Sitting   Grooming: Supervision/safety;Standing;Wash/dry face               Lower Body Dressing: Minimal assistance;Sitting/lateral leans;Sit to/from stand   Toilet Transfer: Contact guard assist;Ambulation;Rolling walker (2 wheels)   Toileting- Clothing Manipulation and Hygiene: Minimal assistance;Sit to/from stand;Sitting/lateral lean       Functional mobility during ADLs: Contact guard assist;Rolling walker (2 wheels);Cueing for sequencing;Cueing for safety      Extremity/Trunk Assessment              Vision       Perception     Praxis      Cognition Arousal: Alert Behavior During Therapy: WFL for tasks assessed/performed Overall Cognitive Status: History of cognitive impairments - at baseline                                 General Comments: hx of dementia; pleasant and agreeable        Exercises      Shoulder  Instructions       General Comments      Pertinent Vitals/ Pain       Pain Assessment Pain Assessment: No/denies pain  Home Living                                          Prior Functioning/Environment              Frequency  Min 1X/week        Progress Toward Goals  OT Goals(current goals can now be found in the care plan section)  Progress  towards OT goals: Progressing toward goals  Acute Rehab OT Goals Patient Stated Goal: not able to partiicpate in goal setting OT Goal Formulation: Patient unable to participate in goal setting Time For Goal Achievement: 12/07/23 Potential to Achieve Goals: Good ADL Goals Pt Will Perform Lower Body Bathing: with set-up;sit to/from stand;sitting/lateral leans Pt Will Perform Lower Body Dressing: with supervision;sitting/lateral leans;sit to/from stand Pt Will Transfer to Toilet: ambulating;with modified independence Additional ADL Goal #1: Pt to verbalize at least 3 fall prevention strategies to implement at home  Plan      Co-evaluation                 AM-PAC OT 6 Clicks Daily Activity     Outcome Measure   Help from another person eating meals?: None Help from another person taking care of personal grooming?: A Little Help from another person toileting, which includes using toliet, bedpan, or urinal?: A Little Help from another person bathing (including washing, rinsing, drying)?: A Little Help from another person to put on and taking off regular upper body clothing?: A Little Help from another person to put on and taking off regular lower body clothing?: A Little 6 Click Score: 19    End of Session Equipment Utilized During Treatment: Gait belt;Rolling walker (2 wheels)  OT Visit Diagnosis: Unsteadiness on feet (R26.81);Other abnormalities of gait and mobility (R26.89);Muscle weakness (generalized) (M62.81);Other symptoms and signs involving cognitive function   Activity Tolerance Patient tolerated treatment well   Patient Left in chair;with call bell/phone within reach;with chair alarm set   Nurse Communication Mobility status        Time: 8664-8595 OT Time Calculation (min): 29 min  Charges: OT General Charges $OT Visit: 1 Visit OT Treatments $Self Care/Home Management : 8-22 mins $Therapeutic Activity: 8-22 mins  Antonae Zbikowski, OTR/L   Charlee Whitebread R  Joely Losier 11/26/2023, 2:10 PM

## 2023-11-26 NOTE — Progress Notes (Signed)
 [facility] Liaison Note   Notified by [facility] of patient/family request for [employer] Palliative services in facility after discharge.   Referral received.   Please call with any questions.   Thank you, Eleanor Nail, [facility] Liaison [phone]

## 2023-11-26 NOTE — Discharge Summary (Signed)
 Physician Discharge Summary  Steven Myers FMW:[id number] DOB: 1935/01/07 DOA: 11/20/2023  PCP: Rexanne Ingle, MD  Admit date: 11/20/2023 Discharge date: 11/26/2023  Time spent: 45 minutes  Recommendations for Outpatient Follow-up:  SNF with outpatient palliative care   Discharge Diagnoses:  Severe biventricular failure Acute on chronic combined systolic and diastolic CHF End-stage cardiac amyloidosis Delirium Suspected dementia   DM (diabetes mellitus) type II controlled with renal manifestation (HCC)   Cardiac amyloidosis (HCC)   CKD stage 3b, GFR 30-44 ml/min (HCC)   Elevated d-dimer   Elevated troponin   Lung nodule   Bladder wall thickening   CHF (congestive heart failure) (HCC)   Discharge Condition: Fair, poor prognosis  Diet recommendation: Low-sodium, heart healthy  Filed Weights   11/23/23 0508 11/24/23 0500 11/26/23 0500  Weight: 82.4 kg 84.8 kg 82.8 kg    History of present illness:  89/M with chronic diastolic CHF, cardiac amyloidosis, CKD 4, type 2 diabetes mellitus presented to the ED with dyspnea on exertion, swelling and some hallucinations. -In the ED he was tachypneic, hypertensive, labs noted creatinine of 2.3, troponin 131, BNP 670, D-dimer 1.9, CT abdomen with pulmonary nodule and bladder wall thickening. -Admitted, started on diuretics -Echo 12/30 noted severe biventricular failure, EF less than 20%, grade 3 DD, severely reduced RV, CHF team consulted, started on milrinone /IV Lasix   Hospital Course:   Acute on chronic systolic CHF, Severe BiV Failure Cardiac amyloidosis;  Acute hypoxic respiratory failure  -Previous echo 10/23 with a EF 45-to 50%, severe LVH with speckled pattern, grade 2 DD, severely reduced RV -Repeat ECHo now w/ EF <20%, Grade 3DD, and severely reduced RV -CHF team following, concern for low output, started on Lasix  and milrinone  12/30, -Subsequently complicated by hypotension, milrinone  and BiDil  were discontinued Heart  failure team felt he had end-stage cardiac amyloidosis, recommended palliative care, seen by palliative care in consultation, now DNR, he will be discharged to SNF per family request, anticipate need for hospice in the near future -Overall prognosis is poor   Syncope, hypotension -Related to severe cardiomyopathy and meds -Briefly required Levophed  in the ICU, subsequently weaned off, BiDil  discontinued as well -Blood pressure stable since then however cannot tolerate much GDMT as a result   Cognitive deficits Delirium, suspected dementia -intermittent confusion, sun downing and hallucinations for few years per spouse -TSH normal, B12 is low, add replacement, -Had multiple episodes of agitation especially at night in the ICU, started on Zyprexa  nightly by critical care team -Discharged to SNF on low-dose nightly his Zyprexa , he will be at risk for delirium   Elevated D-dimer  - D-dimer is 1.97 in ED  -Clinically do not suspect PE, Dopplers negative    Elevated troponin  -Secondary to type II MI from demand ischemia, CHF -Do not suspect ACS at this time   CKD 4  -Baseline creatinine around 2, creatinine slightly higher this admission, cardiorenal -Holding ACE inhibitor, monitor with diuresis   Type II DM  - A1c was 6.5% in November 2024  -Jardiance  resumed   Lung nodule  - Noted on CT in ED  - Discussed with patient, close outpatient follow-up recommended    Mild thrombocytopenia -Chronic, monitor  Discharge Exam: Vitals:   11/25/23 2317 11/26/23 0548  BP: (!) 145/95 (!) 124/97  Pulse: 94 (!) 109  Resp: 18 18  Temp: 97.7 F (36.5 C) (!) 97.4 F (36.3 C)  SpO2: 98% 97%   Gen: Chronically ill elderly male sitting up in bed, oriented to self  and partly to place, moderate cognitive deficits  HEENT: no JVD Lungs: Decreased breath sounds at the bases otherwise clear CVS: S1S2/RRR Abd: soft, Non tender, non distended, BS present Extremities: No edema Skin: no new rashes  on exposed skin   Discharge Instructions    Allergies as of 11/26/2023   No Known Allergies      Medication List     STOP taking these medications    amLODipine  2.5 MG tablet Commonly known as: NORVASC    trandolapril  4 MG tablet Commonly known as: MAVIK        TAKE these medications    artificial tears Oint ophthalmic ointment Commonly known as: LACRILUBE Place 1 Application into both eyes See admin instructions. Place as directed into both eyes as directed at bedtime- 5 minutes after Travoprost   Artificial Tears ophthalmic solution Place 1 drop into both eyes 3 (three) times daily.   aspirin  81 MG tablet Take 81 mg by mouth daily.   brimonidine -timolol  0.2-0.5 % ophthalmic solution Commonly known as: COMBIGAN  Place 1 drop into both eyes in the morning and at bedtime.   cyanocobalamin  1000 MCG tablet Take 1 tablet (1,000 mcg total) by mouth daily. Start taking on: November 27, 2023   empagliflozin  10 MG Tabs tablet Commonly known as: Jardiance  Take 1 tablet (10 mg total) by mouth daily before breakfast.   ezetimibe  10 MG tablet Commonly known as: ZETIA  Take 10 mg by mouth daily.   latanoprost  0.005 % ophthalmic solution Commonly known as: XALATAN  Place 1 drop into both eyes at bedtime. Hasn't started using yet, finishing up the Travatan Z first.   metoprolol  succinate 25 MG 24 hr tablet Commonly known as: TOPROL -XL Take 1 tablet (25 mg total) by mouth daily. Take with or immediately following a meal. What changed:  medication strength how much to take additional instructions   OLANZapine  5 MG tablet Commonly known as: ZYPREXA  Every evening at 8 PM   potassium chloride  SA 20 MEQ tablet Commonly known as: KLOR-CON  M Take 1 tablet (20 mEq total) by mouth daily.   Rhopressa 0.02 % Soln Generic drug: Netarsudil Dimesylate Place 1 drop into both eyes at bedtime.   torsemide  20 MG tablet Commonly known as: DEMADEX  Take 40 mg in a.m. and 20 mg in  afternoon What changed:  how much to take how to take this when to take this additional instructions   Travatan Z 0.004 % Soln ophthalmic solution Generic drug: Travoprost (BAK Free) Place 1 drop into both eyes at bedtime.   Vyndaqel  20 MG Caps Generic drug: Tafamidis  Meglumine  (Cardiac) Take 4 capsules (80 mg total) by mouth daily.       No Known Allergies    The results of significant diagnostics from this hospitalization (including imaging, microbiology, ancillary and laboratory) are listed below for reference.    Significant Diagnostic Studies: DG Chest Port 1 View Result Date: 11/23/2023 CLINICAL DATA:  Status post cardiac arrest. EXAM: PORTABLE CHEST 1 VIEW COMPARISON:  X-ray 11/20/2023. FINDINGS: Enlarged cardiopericardial silhouette. Tortuous ectatic aorta. Right-sided PICC with the tip overlying the right atrium. Underinflation with some prominence of the central vasculature. No pneumothorax, effusion, consolidation or edema. Overlapping cardiac leads and defibrillator pads. IMPRESSION: Enlarged heart with some central vascular prominence. Right-sided PICC. Underinflation Electronically Signed   By: Ranell Bring M.D.   On: 11/23/2023 14:12   US  EKG SITE RITE Result Date: 11/22/2023 If Site Rite image not attached, placement could not be confirmed due to current cardiac rhythm.  ECHOCARDIOGRAM  COMPLETE Result Date: 11/22/2023    ECHOCARDIOGRAM REPORT   Patient Name:   PHOENYX PAULSEN Date of Exam: 11/22/2023 Medical Rec #:  [id number]     Height:       72.0 in Accession #:    [id number]    Weight:       192.5 lb Date of Birth:  08/08/1935     BSA:          2.096 m Patient Age:    88 years      BP:           120/108 mmHg Patient Gender: M             HR:           73 bpm. Exam Location:  Inpatient Procedure: 2D Echo, Cardiac Doppler, Color Doppler and Intracardiac            Opacification Agent Indications:    I50.40* Unspecified combined systolic (congestive) and diastolic                  (congestive) heart failure  History:        Patient has prior history of Echocardiogram examinations, most                 recent 09/11/2022. CHF and Cardiomyopathy, Previous Myocardial                 Infarction, Abnormal ECG, Arrythmias:RBBB,                 Signs/Symptoms:Syncope; Risk Factors:Diabetes and Dyslipidemia.                 Cardiac amyloidosis.  Sonographer:    Ellouise Mose RDCS Referring Phys: [id number] TIMOTHY S OPYD IMPRESSIONS  1. Left ventricular ejection fraction, by estimation, is <20%. The left ventricle has severely decreased function. The left ventricle demonstrates global hypokinesis. There is severe concentric left ventricular hypertrophy. Left ventricular diastolic parameters are consistent with Grade III diastolic dysfunction (restrictive).  2. Right ventricular systolic function is severely reduced. The right ventricular size is moderately enlarged. Moderately increased right ventricular wall thickness.  3. Left atrial size was severely dilated.  4. Right atrial size was moderately dilated.  5. The mitral valve is grossly normal. Trivial mitral valve regurgitation. No evidence of mitral stenosis.  6. The aortic valve is tricuspid. Aortic valve regurgitation is not visualized. No aortic stenosis is present.  7. Pulmonic valve regurgitation is moderate.  8. Aortic dilatation noted. There is dilatation of the ascending aorta, measuring 39 mm.  9. The inferior vena cava is dilated in size with <50% respiratory variability, suggesting right atrial pressure of 15 mmHg. Comparison(s): The left ventricular function is significantly worse. Conclusion(s)/Recommendation(s): No left ventricular mural or apical thrombus/thrombi. FINDINGS  Left Ventricle: Left ventricular ejection fraction, by estimation, is <20%. The left ventricle has severely decreased function. The left ventricle demonstrates global hypokinesis. Definity  contrast agent was given IV to delineate the left ventricular  endocardial borders. The left ventricular internal cavity size was normal in size. There is severe concentric left ventricular hypertrophy. Left ventricular diastolic parameters are consistent with Grade III diastolic dysfunction (restrictive). Right Ventricle: The right ventricular size is moderately enlarged. Moderately increased right ventricular wall thickness. Right ventricular systolic function is severely reduced. Left Atrium: Left atrial size was severely dilated. Right Atrium: Right atrial size was moderately dilated. Pericardium: There is no evidence of pericardial effusion. Mitral Valve: The mitral valve is grossly normal. Trivial mitral  valve regurgitation. No evidence of mitral valve stenosis. Tricuspid Valve: The tricuspid valve is normal in structure. Tricuspid valve regurgitation is trivial. No evidence of tricuspid stenosis. Aortic Valve: The aortic valve is tricuspid. Aortic valve regurgitation is not visualized. No aortic stenosis is present. Pulmonic Valve: The pulmonic valve was normal in structure. Pulmonic valve regurgitation is moderate. No evidence of pulmonic stenosis. Aorta: Aortic dilatation noted. There is dilatation of the ascending aorta, measuring 39 mm. Venous: The inferior vena cava is dilated in size with less than 50% respiratory variability, suggesting right atrial pressure of 15 mmHg. IAS/Shunts: No atrial level shunt detected by color flow Doppler.  LEFT VENTRICLE PLAX 2D LVIDd:         3.95 cm     Diastology LVIDs:         3.50 cm     LV e' medial:    5.66 cm/s LV PW:         2.15 cm     LV E/e' medial:  9.6 LV IVS:        2.20 cm     LV e' lateral:   4.68 cm/s LVOT diam:     2.30 cm     LV E/e' lateral: 11.6 LV SV:         38 LV SV Index:   18 LVOT Area:     4.15 cm  LV Volumes (MOD) LV vol d, MOD A2C: 90.8 ml LV vol d, MOD A4C: 66.4 ml LV vol s, MOD A2C: 63.3 ml LV vol s, MOD A4C: 49.4 ml LV SV MOD A2C:     27.5 ml LV SV MOD A4C:     66.4 ml LV SV MOD BP:      21.2 ml RIGHT  VENTRICLE             IVC RV S prime:     10.30 cm/s  IVC diam: 2.60 cm TAPSE (M-mode): 1.3 cm LEFT ATRIUM             Index        RIGHT ATRIUM           Index LA diam:        4.00 cm 1.91 cm/m   RA Area:     19.70 cm LA Vol (A2C):   63.2 ml 30.15 ml/m  RA Volume:   65.00 ml  31.01 ml/m LA Vol (A4C):   94.6 ml 45.13 ml/m LA Biplane Vol: 77.0 ml 36.74 ml/m  AORTIC VALVE             PULMONIC VALVE LVOT Vmax:   55.50 cm/s  PR End Diast Vel: 2.17 msec LVOT Vmean:  33.500 cm/s LVOT VTI:    0.092 m  AORTA Ao Root diam: 3.70 cm Ao Asc diam:  3.85 cm MITRAL VALVE MV Area (PHT): 3.42 cm    SHUNTS MV Decel Time: 222 msec    Systemic VTI:  0.09 m MV E velocity: 54.50 cm/s  Systemic Diam: 2.30 cm MV A velocity: 35.60 cm/s MV E/A ratio:  1.53 Aditya Sabharwal Electronically signed by Ria Commander Signature Date/Time: 11/22/2023/11:05:31 AM    Final    VAS US  LOWER EXTREMITY VENOUS (DVT) Result Date: 11/21/2023  Lower Venous DVT Study Patient Name:  KEGHAN MCFARREN  Date of Exam:   11/21/2023 Medical Rec #: [id number]      Accession #:    [id number] Date of Birth: [birth year]      Patient Gender: M Patient Age:  88 years Exam Location:  [facility] Procedure:      VAS US  LOWER EXTREMITY VENOUS (DVT) Referring Phys: TIMOTHY OPYD --------------------------------------------------------------------------------  Indications: Swelling, and Edema.  Comparison Study: Previous study on 8.19.2024. Performing Technologist: Edilia Elden Appl  Examination Guidelines: A complete evaluation includes B-mode imaging, spectral Doppler, color Doppler, and power Doppler as needed of all accessible portions of each vessel. Bilateral testing is considered an integral part of a complete examination. Limited examinations for reoccurring indications may be performed as noted. The reflux portion of the exam is performed with the patient in reverse Trendelenburg.   +-----+---------------+---------+-----------+----------+--------------+ RIGHTCompressibilityPhasicitySpontaneityPropertiesThrombus Aging +-----+---------------+---------+-----------+----------+--------------+ CFV  Full           Yes      Yes                                 +-----+---------------+---------+-----------+----------+--------------+ SFJ  Full           Yes      Yes                                 +-----+---------------+---------+-----------+----------+--------------+   +---------+---------------+---------+-----------+----------+--------------+ LEFT     CompressibilityPhasicitySpontaneityPropertiesThrombus Aging +---------+---------------+---------+-----------+----------+--------------+ CFV      Full           Yes      Yes                                 +---------+---------------+---------+-----------+----------+--------------+ SFJ      Full           Yes      Yes                                 +---------+---------------+---------+-----------+----------+--------------+ FV Prox  Full                                                        +---------+---------------+---------+-----------+----------+--------------+ FV Mid   Full                                                        +---------+---------------+---------+-----------+----------+--------------+ FV DistalFull                                                        +---------+---------------+---------+-----------+----------+--------------+ PFV      Full                                                        +---------+---------------+---------+-----------+----------+--------------+ POP      Full           Yes  Yes                                 +---------+---------------+---------+-----------+----------+--------------+ PTV      Full                                                        +---------+---------------+---------+-----------+----------+--------------+  PERO     Full                                                        +---------+---------------+---------+-----------+----------+--------------+     Summary: RIGHT: - No evidence of common femoral vein obstruction.   LEFT: - There is no evidence of deep vein thrombosis in the lower extremity.  - No cystic structure found in the popliteal fossa.  *See table(s) above for measurements and observations. Electronically signed by Debby Robertson on 11/21/2023 at 12:33:42 PM.    Final    CT ABDOMEN PELVIS WO CONTRAST Result Date: 11/21/2023 CLINICAL DATA:  Abdominal pain, acute, nonlocalized tachypnea, fluid on his abdomen. SOB. Urinary symptoms x 1 week. A/O x 4, issues with time line. 150/120, hr 80'sRBBB, 84% on RA, placed on NRB, now 100% 15 liters EXAM: CT ABDOMEN AND PELVIS WITHOUT CONTRAST TECHNIQUE: Multidetector CT imaging of the abdomen and pelvis was performed following the standard protocol without IV contrast. RADIATION DOSE REDUCTION: This exam was performed according to the departmental dose-optimization program which includes automated exposure control, adjustment of the mA and/or kV according to patient size and/or use of iterative reconstruction technique. COMPARISON:  None Available. FINDINGS: Lower chest: A 0.9 x 1 cm right middle lobe subpleural nodule. Cardiomegaly. Hepatobiliary: No focal consolidation. No pulmonary nodule. No pulmonary mass. No pleural effusion. No pneumothorax. Pancreas: No focal lesion. Normal pancreatic contour. No surrounding inflammatory changes. No main pancreatic ductal dilatation. Spleen: Normal in size without focal abnormality. Adrenals/Urinary Tract: No adrenal nodule bilaterally. No nephrolithiasis and no hydronephrosis. Fluid density lesions of the kidneys likely represent simple renal cysts. Simple renal cysts, in the absence of clinically indicated signs/symptoms, require no independent follow-up. No ureterolithiasis or hydroureter. Urinary bladder wall  thickening. Stomach/Bowel: Stomach is within normal limits. No evidence of bowel wall thickening or dilatation. Appendix appears normal. Vascular/Lymphatic: No abdominal aorta or iliac aneurysm. Moderate atherosclerotic plaque of the aorta and its branches. No abdominal, pelvic, or inguinal lymphadenopathy. Reproductive: Prostate is unremarkable. Other: Presacral soft tissue density and fat stranding. No intraperitoneal free fluid. No intraperitoneal free gas. No organized fluid collection. Musculoskeletal: No abdominal wall hernia or abnormality.  Diastasis rectus. No suspicious lytic or blastic osseous lesions. No acute displaced fracture. IMPRESSION: 1. Urinary bladder wall thickening. Correlate with urinalysis for infection. Underlying malignancy not excluded. 2. Indeterminate presacral soft tissue density and fat stranding. 3. A 0.9 x 1 cm right middle lobe subpleural nodule. Consider one of the following in 3 months for both low-risk and high-risk individuals: (a) repeat chest CT, (b) follow-up PET-CT, or (c) tissue sampling. This recommendation follows the consensus statement: Guidelines for Management of Incidental Pulmonary Nodules Detected on CT Images: From the Fleischner Society 2017; Radiology 2017; 284:228-243.  4.  Aortic Atherosclerosis (ICD10-I70.0). Electronically Signed   By: Morgane  Naveau M.D.   On: 11/21/2023 02:59   CT Head Wo Contrast Result Date: 11/21/2023 CLINICAL DATA:  Altered mental status EXAM: CT HEAD WITHOUT CONTRAST TECHNIQUE: Contiguous axial images were obtained from the base of the skull through the vertex without intravenous contrast. RADIATION DOSE REDUCTION: This exam was performed according to the departmental dose-optimization program which includes automated exposure control, adjustment of the mA and/or kV according to patient size and/or use of iterative reconstruction technique. COMPARISON:  11/14/2022 FINDINGS: Brain: No evidence of acute infarction, hemorrhage,  mass, mass effect, or midline shift. No hydrocephalus or extra-axial fluid collection. Periventricular white matter changes, likely the sequela of chronic small vessel ischemic disease. Age related cerebral atrophy. Vascular: No hyperdense vessel. Skull: Negative for fracture or focal lesion. Sinuses/Orbits: Mucosal thickening in the ethmoid air cells. No acute finding in the orbits. Other: The mastoid air cells are well aerated. IMPRESSION: No acute intracranial process. Electronically Signed   By: Donald Campion M.D.   On: 11/21/2023 02:41   DG Chest Port 1 View Result Date: 11/20/2023 CLINICAL DATA:  Short of breath, altered level of consciousness EXAM: PORTABLE CHEST 1 VIEW COMPARISON:  11/14/2022 FINDINGS: Single frontal view of the chest demonstrates stable enlargement of the cardiac silhouette. No acute airspace disease, effusion, or pneumothorax. No acute bony abnormality. IMPRESSION: Liver would stable chest, no acute process. Electronically Signed   By: Ozell Daring M.D.   On: 11/20/2023 23:37    Microbiology: Recent Results (from the past 240 hours)  Resp panel by RT-PCR (RSV, Flu A&B, Covid) Anterior Nasal Swab     Status: None   Collection Time: 11/20/23 11:08 PM   Specimen: Anterior Nasal Swab  Result Value Ref Range Status   SARS Coronavirus 2 by RT PCR NEGATIVE NEGATIVE Final   Influenza A by PCR NEGATIVE NEGATIVE Final   Influenza B by PCR NEGATIVE NEGATIVE Final    Comment: (NOTE) The Xpert Xpress SARS-CoV-2/FLU/RSV plus assay is intended as an aid in the diagnosis of influenza from Nasopharyngeal swab specimens and should not be used as a sole basis for treatment. Nasal washings and aspirates are unacceptable for Xpert Xpress SARS-CoV-2/FLU/RSV testing.  Fact Sheet for Patients: bloggercourse.com  Fact Sheet for Healthcare Providers: seriousbroker.it  This test is not yet approved or cleared by the [place]  FDA  and has been authorized for detection and/or diagnosis of SARS-CoV-2 by FDA under an Emergency Use Authorization (EUA). This EUA will remain in effect (meaning this test can be used) for the duration of the COVID-19 declaration under Section 564(b)(1) of the Act, 21 U.S.C. section 360bbb-3(b)(1), unless the authorization is terminated or revoked.     Resp Syncytial Virus by PCR NEGATIVE NEGATIVE Final    Comment: (NOTE) Fact Sheet for Patients: bloggercourse.com  Fact Sheet for Healthcare Providers: seriousbroker.it  This test is not yet approved or cleared by the [place]  FDA and has been authorized for detection and/or diagnosis of SARS-CoV-2 by FDA under an Emergency Use Authorization (EUA). This EUA will remain in effect (meaning this test can be used) for the duration of the COVID-19 declaration under Section 564(b)(1) of the Act, 21 U.S.C. section 360bbb-3(b)(1), unless the authorization is terminated or revoked.  Performed at [facility], [address], [place], [place] [address]   Culture, blood (Routine X 2) w Reflex to ID Panel     Status: None (Preliminary result)   Collection Time:  11/23/23 12:54 PM   Specimen: BLOOD  Result Value Ref Range Status   Specimen Description BLOOD SITE NOT SPECIFIED  Final   Special Requests   Final    BOTTLES DRAWN AEROBIC ONLY Blood Culture results may not be optimal due to an inadequate volume of blood received in culture bottles   Culture   Final    NO GROWTH 3 DAYS Performed at [facility], [address] [place], [place] [address]    Report Status PENDING  Incomplete  Culture, blood (Routine X 2) w Reflex to ID Panel     Status: None (Preliminary result)   Collection Time: 11/23/23  3:21 PM   Specimen: BLOOD LEFT HAND  Result Value Ref Range Status   Specimen Description BLOOD LEFT HAND  Final   Special Requests   Final    BOTTLES DRAWN AEROBIC AND  ANAEROBIC Blood Culture results may not be optimal due to an inadequate volume of blood received in culture bottles   Culture   Final    NO GROWTH 3 DAYS Performed at [facility], [address], [place], [place] [address]    Report Status PENDING  Incomplete     Labs: Basic Metabolic Panel: Recent Labs  Lab 11/22/23 0248 11/23/23 0309 11/23/23 1133 11/23/23 1258 11/24/23 0250 11/25/23 0322 11/26/23 0444  NA 143 139 139 129* 142 141 141  K 3.8 3.7 4.2 4.1 5.0 4.1 4.4  CL 107 104  --  96* 102 103 102  CO2 26 25  --  23 28 24 25   GLUCOSE 90 156*  --  443* 132* 91 96  BUN 28* 29*  --  34* 36* 28* 34*  CREATININE 1.95* 1.94*  --  2.17* 2.47* 2.25* 2.56*  CALCIUM 8.7* 8.7*  --  8.3* 9.3 9.1 9.4  MG 2.2 2.1  --   --  2.3 2.1 2.2   Liver Function Tests: Recent Labs  Lab 11/21/23 0012  AST 29  ALT 21  ALKPHOS 56  BILITOT 0.3  PROT 6.5  ALBUMIN 3.4*   No results for input(s): LIPASE, AMYLASE in the last 168 hours. Recent Labs  Lab 11/23/23 1258  AMMONIA 20   CBC: Recent Labs  Lab 11/20/23 2319 11/22/23 0431 11/23/23 0309 11/23/23 1126 11/23/23 1133 11/23/23 1258 11/24/23 0250 11/25/23 0322 11/26/23 0444  WBC 3.5* 4.3   < > 5.7  --  7.2 6.4 5.0 5.2  NEUTROABS 2.5 2.8  --   --   --   --   --   --   --   HGB 10.7* 16.7   < > 14.8 17.7* 15.6 17.4* 18.1* 19.3*  HCT 34.1* 51.9   < > 44.9 52.0 47.4 52.4* 53.8* 58.6*  MCV 94.2 89.8   < > 89.1  --  87.5 87.2 86.2 87.5  PLT 124* 126*   < > 153  --  174 177 158 159   < > = values in this interval not displayed.   Cardiac Enzymes: No results for input(s): CKTOTAL, CKMB, CKMBINDEX, TROPONINI in the last 168 hours. BNP: BNP (last 3 results) Recent Labs    10/08/23 1130 11/20/23 2300  BNP 771.0* 669.8*    ProBNP (last 3 results) No results for input(s): PROBNP in the last 8760 hours.  CBG: Recent Labs  Lab 11/25/23 0621 11/25/23 1211 11/25/23 1628 11/25/23 2117 11/26/23 0615  GLUCAP  123* 137* 214* 103* 130*       Signed:  Sigurd Pac MD.  [facility] 11/26/2023, 10:19 AM

## 2023-11-26 NOTE — TOC Transition Note (Signed)
 Transition of Care [facility]) - Discharge Note   Patient Details  Name: Steven Myers MRN: [id number] Date of Birth: 09/23/1935  Transition of Care [facility]) CM/SW Contact:  [name] Phone Number: [phone] 11/26/2023, 2:10 PM   Clinical Narrative:  HF CSW called and spoke with Shona [phone] at [facility] who stated that they have a bed and willing to accept the pt. Pt will be transported by [facility]. CSW called and updated the pts son and spouse that transport to [facility] will be today.   Number for report: [phone]. [phone]; pts room #[id number].   CSW placed med necessity, facesheet, DNR placed on pts chart.        Barriers to Discharge: Continued Medical Work up   Patient Goals and CMS Choice Patient states their goals for this hospitalization and ongoing recovery are:: wants to get better          Discharge Placement                       Discharge Plan and Services Additional resources added to the After Visit Summary for     Discharge Planning Services: CM Consult                                 Social Drivers of Health (SDOH) Interventions SDOH Screenings   Food Insecurity: No Food Insecurity (11/21/2023)  Housing: Low Risk  (11/21/2023)  Transportation Needs: No Transportation Needs (11/21/2023)  Utilities: Not At Risk (11/21/2023)  Social Connections: Patient Declined (11/22/2023)  Tobacco Use: Medium Risk (11/21/2023)     Readmission Risk Interventions     No data to display

## 2023-11-28 LAB — CULTURE, BLOOD (ROUTINE X 2)
Culture: NO GROWTH
Culture: NO GROWTH

## 2023-11-29 DIAGNOSIS — E785 Hyperlipidemia, unspecified: Secondary | ICD-10-CM | POA: Diagnosis not present

## 2023-11-29 DIAGNOSIS — E1129 Type 2 diabetes mellitus with other diabetic kidney complication: Secondary | ICD-10-CM | POA: Diagnosis not present

## 2023-11-29 DIAGNOSIS — F05 Delirium due to known physiological condition: Secondary | ICD-10-CM | POA: Diagnosis not present

## 2023-11-29 DIAGNOSIS — I10 Essential (primary) hypertension: Secondary | ICD-10-CM | POA: Diagnosis not present

## 2023-11-29 DIAGNOSIS — I509 Heart failure, unspecified: Secondary | ICD-10-CM | POA: Diagnosis not present

## 2023-11-29 DIAGNOSIS — I5082 Biventricular heart failure: Secondary | ICD-10-CM | POA: Diagnosis not present

## 2023-11-29 DIAGNOSIS — H44519 Absolute glaucoma, unspecified eye: Secondary | ICD-10-CM | POA: Diagnosis not present

## 2023-11-30 DIAGNOSIS — I5082 Biventricular heart failure: Secondary | ICD-10-CM | POA: Diagnosis not present

## 2023-11-30 DIAGNOSIS — E1129 Type 2 diabetes mellitus with other diabetic kidney complication: Secondary | ICD-10-CM | POA: Diagnosis not present

## 2023-11-30 DIAGNOSIS — I509 Heart failure, unspecified: Secondary | ICD-10-CM | POA: Diagnosis not present

## 2023-11-30 DIAGNOSIS — L97218 Non-pressure chronic ulcer of right calf with other specified severity: Secondary | ICD-10-CM | POA: Diagnosis not present

## 2023-11-30 DIAGNOSIS — F05 Delirium due to known physiological condition: Secondary | ICD-10-CM | POA: Diagnosis not present

## 2023-11-30 DIAGNOSIS — I10 Essential (primary) hypertension: Secondary | ICD-10-CM | POA: Diagnosis not present

## 2023-11-30 DIAGNOSIS — H44519 Absolute glaucoma, unspecified eye: Secondary | ICD-10-CM | POA: Diagnosis not present

## 2023-11-30 DIAGNOSIS — E785 Hyperlipidemia, unspecified: Secondary | ICD-10-CM | POA: Diagnosis not present

## 2023-12-01 ENCOUNTER — Other Ambulatory Visit: Payer: Self-pay | Admitting: *Deleted

## 2023-12-01 DIAGNOSIS — I509 Heart failure, unspecified: Secondary | ICD-10-CM | POA: Diagnosis not present

## 2023-12-01 DIAGNOSIS — F05 Delirium due to known physiological condition: Secondary | ICD-10-CM | POA: Diagnosis not present

## 2023-12-01 DIAGNOSIS — E1129 Type 2 diabetes mellitus with other diabetic kidney complication: Secondary | ICD-10-CM | POA: Diagnosis not present

## 2023-12-01 DIAGNOSIS — I5082 Biventricular heart failure: Secondary | ICD-10-CM | POA: Diagnosis not present

## 2023-12-01 DIAGNOSIS — I10 Essential (primary) hypertension: Secondary | ICD-10-CM | POA: Diagnosis not present

## 2023-12-01 DIAGNOSIS — H44519 Absolute glaucoma, unspecified eye: Secondary | ICD-10-CM | POA: Diagnosis not present

## 2023-12-01 DIAGNOSIS — E785 Hyperlipidemia, unspecified: Secondary | ICD-10-CM | POA: Diagnosis not present

## 2023-12-01 NOTE — Patient Outreach (Signed)
 Steven Myers resides in [facility].   PCP is with [name] who has CM services.   Telephone call made to Steven Myers (spouse/DPR) [phone]. Writer introduced self and role. Discussed writer will follow along and assist if needed while Steven Myers resides in [name]. Discussed clinical research associate will send notification to [place] CM team upon SNF discharge. Steven Myers reports transition plan is to return.   Writer provided contact information.  Will continue to follow.   Pablo Hurst, MSN, RN, BSN   [facility], [facility] RN Post- Acute Care Manager Direct Dial: [phone]

## 2023-12-02 DIAGNOSIS — I5082 Biventricular heart failure: Secondary | ICD-10-CM | POA: Diagnosis not present

## 2023-12-02 DIAGNOSIS — E785 Hyperlipidemia, unspecified: Secondary | ICD-10-CM | POA: Diagnosis not present

## 2023-12-02 DIAGNOSIS — I509 Heart failure, unspecified: Secondary | ICD-10-CM | POA: Diagnosis not present

## 2023-12-02 DIAGNOSIS — E1129 Type 2 diabetes mellitus with other diabetic kidney complication: Secondary | ICD-10-CM | POA: Diagnosis not present

## 2023-12-02 DIAGNOSIS — H44519 Absolute glaucoma, unspecified eye: Secondary | ICD-10-CM | POA: Diagnosis not present

## 2023-12-02 DIAGNOSIS — F05 Delirium due to known physiological condition: Secondary | ICD-10-CM | POA: Diagnosis not present

## 2023-12-02 DIAGNOSIS — I10 Essential (primary) hypertension: Secondary | ICD-10-CM | POA: Diagnosis not present

## 2023-12-03 DIAGNOSIS — I5082 Biventricular heart failure: Secondary | ICD-10-CM | POA: Diagnosis not present

## 2023-12-03 DIAGNOSIS — E785 Hyperlipidemia, unspecified: Secondary | ICD-10-CM | POA: Diagnosis not present

## 2023-12-03 DIAGNOSIS — H44519 Absolute glaucoma, unspecified eye: Secondary | ICD-10-CM | POA: Diagnosis not present

## 2023-12-03 DIAGNOSIS — I509 Heart failure, unspecified: Secondary | ICD-10-CM | POA: Diagnosis not present

## 2023-12-03 DIAGNOSIS — E1129 Type 2 diabetes mellitus with other diabetic kidney complication: Secondary | ICD-10-CM | POA: Diagnosis not present

## 2023-12-03 DIAGNOSIS — I10 Essential (primary) hypertension: Secondary | ICD-10-CM | POA: Diagnosis not present

## 2023-12-03 DIAGNOSIS — F05 Delirium due to known physiological condition: Secondary | ICD-10-CM | POA: Diagnosis not present

## 2023-12-06 ENCOUNTER — Ambulatory Visit: Payer: Medicare Other | Admitting: Neurology

## 2023-12-06 DIAGNOSIS — E1129 Type 2 diabetes mellitus with other diabetic kidney complication: Secondary | ICD-10-CM | POA: Diagnosis not present

## 2023-12-06 DIAGNOSIS — I5082 Biventricular heart failure: Secondary | ICD-10-CM | POA: Diagnosis not present

## 2023-12-06 DIAGNOSIS — H44519 Absolute glaucoma, unspecified eye: Secondary | ICD-10-CM | POA: Diagnosis not present

## 2023-12-06 DIAGNOSIS — E785 Hyperlipidemia, unspecified: Secondary | ICD-10-CM | POA: Diagnosis not present

## 2023-12-06 DIAGNOSIS — F05 Delirium due to known physiological condition: Secondary | ICD-10-CM | POA: Diagnosis not present

## 2023-12-06 DIAGNOSIS — I509 Heart failure, unspecified: Secondary | ICD-10-CM | POA: Diagnosis not present

## 2023-12-06 DIAGNOSIS — I10 Essential (primary) hypertension: Secondary | ICD-10-CM | POA: Diagnosis not present

## 2023-12-07 ENCOUNTER — Other Ambulatory Visit: Payer: Self-pay

## 2023-12-07 DIAGNOSIS — E785 Hyperlipidemia, unspecified: Secondary | ICD-10-CM | POA: Diagnosis not present

## 2023-12-07 DIAGNOSIS — L97218 Non-pressure chronic ulcer of right calf with other specified severity: Secondary | ICD-10-CM | POA: Diagnosis not present

## 2023-12-07 DIAGNOSIS — I10 Essential (primary) hypertension: Secondary | ICD-10-CM | POA: Diagnosis not present

## 2023-12-07 DIAGNOSIS — E1129 Type 2 diabetes mellitus with other diabetic kidney complication: Secondary | ICD-10-CM | POA: Diagnosis not present

## 2023-12-07 NOTE — Progress Notes (Signed)
 Specialty Pharmacy Refill Coordination Note  Steven Myers is a 89 y.o. male contacted today regarding refills of specialty medication(s) Tafamidis  Meglumine  (Cardiac) (Vyndaqel )   Patient requested Delivery   Delivery date: 12/13/23   Verified address: [address], [place] [address]   Medication will be filled on 12/10/23.

## 2023-12-10 ENCOUNTER — Other Ambulatory Visit: Payer: Self-pay

## 2023-12-10 DIAGNOSIS — H44519 Absolute glaucoma, unspecified eye: Secondary | ICD-10-CM | POA: Diagnosis not present

## 2023-12-10 DIAGNOSIS — I509 Heart failure, unspecified: Secondary | ICD-10-CM | POA: Diagnosis not present

## 2023-12-10 DIAGNOSIS — I5082 Biventricular heart failure: Secondary | ICD-10-CM | POA: Diagnosis not present

## 2023-12-10 DIAGNOSIS — E1129 Type 2 diabetes mellitus with other diabetic kidney complication: Secondary | ICD-10-CM | POA: Diagnosis not present

## 2023-12-10 DIAGNOSIS — E785 Hyperlipidemia, unspecified: Secondary | ICD-10-CM | POA: Diagnosis not present

## 2023-12-10 DIAGNOSIS — F05 Delirium due to known physiological condition: Secondary | ICD-10-CM | POA: Diagnosis not present

## 2023-12-10 DIAGNOSIS — I10 Essential (primary) hypertension: Secondary | ICD-10-CM | POA: Diagnosis not present

## 2023-12-13 DIAGNOSIS — I5022 Chronic systolic (congestive) heart failure: Secondary | ICD-10-CM | POA: Diagnosis not present

## 2023-12-13 DIAGNOSIS — N1832 Chronic kidney disease, stage 3b: Secondary | ICD-10-CM | POA: Diagnosis not present

## 2023-12-13 DIAGNOSIS — E854 Organ-limited amyloidosis: Secondary | ICD-10-CM | POA: Diagnosis not present

## 2023-12-13 DIAGNOSIS — N3289 Other specified disorders of bladder: Secondary | ICD-10-CM | POA: Diagnosis not present

## 2023-12-13 DIAGNOSIS — I5082 Biventricular heart failure: Secondary | ICD-10-CM | POA: Diagnosis not present

## 2023-12-13 DIAGNOSIS — H44519 Absolute glaucoma, unspecified eye: Secondary | ICD-10-CM | POA: Diagnosis not present

## 2023-12-13 DIAGNOSIS — I43 Cardiomyopathy in diseases classified elsewhere: Secondary | ICD-10-CM | POA: Diagnosis not present

## 2023-12-13 DIAGNOSIS — I10 Essential (primary) hypertension: Secondary | ICD-10-CM | POA: Diagnosis not present

## 2023-12-13 DIAGNOSIS — F05 Delirium due to known physiological condition: Secondary | ICD-10-CM | POA: Diagnosis not present

## 2023-12-13 DIAGNOSIS — R911 Solitary pulmonary nodule: Secondary | ICD-10-CM | POA: Diagnosis not present

## 2023-12-13 DIAGNOSIS — R7989 Other specified abnormal findings of blood chemistry: Secondary | ICD-10-CM | POA: Diagnosis not present

## 2023-12-13 DIAGNOSIS — I509 Heart failure, unspecified: Secondary | ICD-10-CM | POA: Diagnosis not present

## 2023-12-14 DIAGNOSIS — R7989 Other specified abnormal findings of blood chemistry: Secondary | ICD-10-CM | POA: Diagnosis not present

## 2023-12-14 DIAGNOSIS — F05 Delirium due to known physiological condition: Secondary | ICD-10-CM | POA: Diagnosis not present

## 2023-12-14 DIAGNOSIS — R911 Solitary pulmonary nodule: Secondary | ICD-10-CM | POA: Diagnosis not present

## 2023-12-14 DIAGNOSIS — E854 Organ-limited amyloidosis: Secondary | ICD-10-CM | POA: Diagnosis not present

## 2023-12-14 DIAGNOSIS — I5022 Chronic systolic (congestive) heart failure: Secondary | ICD-10-CM | POA: Diagnosis not present

## 2023-12-14 DIAGNOSIS — I5082 Biventricular heart failure: Secondary | ICD-10-CM | POA: Diagnosis not present

## 2023-12-14 DIAGNOSIS — H44519 Absolute glaucoma, unspecified eye: Secondary | ICD-10-CM | POA: Diagnosis not present

## 2023-12-14 DIAGNOSIS — I10 Essential (primary) hypertension: Secondary | ICD-10-CM | POA: Diagnosis not present

## 2023-12-14 DIAGNOSIS — I43 Cardiomyopathy in diseases classified elsewhere: Secondary | ICD-10-CM | POA: Diagnosis not present

## 2023-12-14 DIAGNOSIS — N1832 Chronic kidney disease, stage 3b: Secondary | ICD-10-CM | POA: Diagnosis not present

## 2023-12-14 DIAGNOSIS — I509 Heart failure, unspecified: Secondary | ICD-10-CM | POA: Diagnosis not present

## 2023-12-14 DIAGNOSIS — N3289 Other specified disorders of bladder: Secondary | ICD-10-CM | POA: Diagnosis not present

## 2023-12-15 DIAGNOSIS — N1832 Chronic kidney disease, stage 3b: Secondary | ICD-10-CM | POA: Diagnosis not present

## 2023-12-15 DIAGNOSIS — N3289 Other specified disorders of bladder: Secondary | ICD-10-CM | POA: Diagnosis not present

## 2023-12-15 DIAGNOSIS — I5082 Biventricular heart failure: Secondary | ICD-10-CM | POA: Diagnosis not present

## 2023-12-15 DIAGNOSIS — I509 Heart failure, unspecified: Secondary | ICD-10-CM | POA: Diagnosis not present

## 2023-12-15 DIAGNOSIS — I43 Cardiomyopathy in diseases classified elsewhere: Secondary | ICD-10-CM | POA: Diagnosis not present

## 2023-12-15 DIAGNOSIS — F05 Delirium due to known physiological condition: Secondary | ICD-10-CM | POA: Diagnosis not present

## 2023-12-15 DIAGNOSIS — R911 Solitary pulmonary nodule: Secondary | ICD-10-CM | POA: Diagnosis not present

## 2023-12-15 DIAGNOSIS — E854 Organ-limited amyloidosis: Secondary | ICD-10-CM | POA: Diagnosis not present

## 2023-12-15 DIAGNOSIS — I5022 Chronic systolic (congestive) heart failure: Secondary | ICD-10-CM | POA: Diagnosis not present

## 2023-12-15 DIAGNOSIS — R7989 Other specified abnormal findings of blood chemistry: Secondary | ICD-10-CM | POA: Diagnosis not present

## 2023-12-15 DIAGNOSIS — H44519 Absolute glaucoma, unspecified eye: Secondary | ICD-10-CM | POA: Diagnosis not present

## 2023-12-15 DIAGNOSIS — I10 Essential (primary) hypertension: Secondary | ICD-10-CM | POA: Diagnosis not present

## 2023-12-16 DIAGNOSIS — N1832 Chronic kidney disease, stage 3b: Secondary | ICD-10-CM | POA: Diagnosis not present

## 2023-12-16 DIAGNOSIS — I10 Essential (primary) hypertension: Secondary | ICD-10-CM | POA: Diagnosis not present

## 2023-12-16 DIAGNOSIS — L97218 Non-pressure chronic ulcer of right calf with other specified severity: Secondary | ICD-10-CM | POA: Diagnosis not present

## 2023-12-16 DIAGNOSIS — R7989 Other specified abnormal findings of blood chemistry: Secondary | ICD-10-CM | POA: Diagnosis not present

## 2023-12-16 DIAGNOSIS — I509 Heart failure, unspecified: Secondary | ICD-10-CM | POA: Diagnosis not present

## 2023-12-16 DIAGNOSIS — I5082 Biventricular heart failure: Secondary | ICD-10-CM | POA: Diagnosis not present

## 2023-12-16 DIAGNOSIS — H44519 Absolute glaucoma, unspecified eye: Secondary | ICD-10-CM | POA: Diagnosis not present

## 2023-12-16 DIAGNOSIS — E854 Organ-limited amyloidosis: Secondary | ICD-10-CM | POA: Diagnosis not present

## 2023-12-16 DIAGNOSIS — E1129 Type 2 diabetes mellitus with other diabetic kidney complication: Secondary | ICD-10-CM | POA: Diagnosis not present

## 2023-12-16 DIAGNOSIS — E785 Hyperlipidemia, unspecified: Secondary | ICD-10-CM | POA: Diagnosis not present

## 2023-12-16 DIAGNOSIS — F05 Delirium due to known physiological condition: Secondary | ICD-10-CM | POA: Diagnosis not present

## 2023-12-16 DIAGNOSIS — I43 Cardiomyopathy in diseases classified elsewhere: Secondary | ICD-10-CM | POA: Diagnosis not present

## 2023-12-16 DIAGNOSIS — I5022 Chronic systolic (congestive) heart failure: Secondary | ICD-10-CM | POA: Diagnosis not present

## 2023-12-16 DIAGNOSIS — R911 Solitary pulmonary nodule: Secondary | ICD-10-CM | POA: Diagnosis not present

## 2023-12-16 DIAGNOSIS — N3289 Other specified disorders of bladder: Secondary | ICD-10-CM | POA: Diagnosis not present

## 2023-12-17 DIAGNOSIS — R7989 Other specified abnormal findings of blood chemistry: Secondary | ICD-10-CM | POA: Diagnosis not present

## 2023-12-17 DIAGNOSIS — N3289 Other specified disorders of bladder: Secondary | ICD-10-CM | POA: Diagnosis not present

## 2023-12-17 DIAGNOSIS — I5022 Chronic systolic (congestive) heart failure: Secondary | ICD-10-CM | POA: Diagnosis not present

## 2023-12-17 DIAGNOSIS — N1832 Chronic kidney disease, stage 3b: Secondary | ICD-10-CM | POA: Diagnosis not present

## 2023-12-17 DIAGNOSIS — I43 Cardiomyopathy in diseases classified elsewhere: Secondary | ICD-10-CM | POA: Diagnosis not present

## 2023-12-17 DIAGNOSIS — I5082 Biventricular heart failure: Secondary | ICD-10-CM | POA: Diagnosis not present

## 2023-12-17 DIAGNOSIS — F05 Delirium due to known physiological condition: Secondary | ICD-10-CM | POA: Diagnosis not present

## 2023-12-17 DIAGNOSIS — I110 Hypertensive heart disease with heart failure: Secondary | ICD-10-CM | POA: Diagnosis not present

## 2023-12-17 DIAGNOSIS — R911 Solitary pulmonary nodule: Secondary | ICD-10-CM | POA: Diagnosis not present

## 2023-12-17 DIAGNOSIS — E854 Organ-limited amyloidosis: Secondary | ICD-10-CM | POA: Diagnosis not present

## 2023-12-17 DIAGNOSIS — H44519 Absolute glaucoma, unspecified eye: Secondary | ICD-10-CM | POA: Diagnosis not present

## 2023-12-20 DIAGNOSIS — I5042 Chronic combined systolic (congestive) and diastolic (congestive) heart failure: Secondary | ICD-10-CM | POA: Diagnosis not present

## 2023-12-20 DIAGNOSIS — R911 Solitary pulmonary nodule: Secondary | ICD-10-CM | POA: Diagnosis not present

## 2023-12-20 DIAGNOSIS — N1832 Chronic kidney disease, stage 3b: Secondary | ICD-10-CM | POA: Diagnosis not present

## 2023-12-20 DIAGNOSIS — R296 Repeated falls: Secondary | ICD-10-CM | POA: Diagnosis not present

## 2023-12-20 DIAGNOSIS — Z515 Encounter for palliative care: Secondary | ICD-10-CM | POA: Diagnosis not present

## 2023-12-20 DIAGNOSIS — I43 Cardiomyopathy in diseases classified elsewhere: Secondary | ICD-10-CM | POA: Diagnosis not present

## 2023-12-20 DIAGNOSIS — R7989 Other specified abnormal findings of blood chemistry: Secondary | ICD-10-CM | POA: Diagnosis not present

## 2023-12-20 DIAGNOSIS — N3289 Other specified disorders of bladder: Secondary | ICD-10-CM | POA: Diagnosis not present

## 2023-12-20 DIAGNOSIS — H44519 Absolute glaucoma, unspecified eye: Secondary | ICD-10-CM | POA: Diagnosis not present

## 2023-12-20 DIAGNOSIS — I130 Hypertensive heart and chronic kidney disease with heart failure and stage 1 through stage 4 chronic kidney disease, or unspecified chronic kidney disease: Secondary | ICD-10-CM | POA: Diagnosis not present

## 2023-12-20 DIAGNOSIS — F05 Delirium due to known physiological condition: Secondary | ICD-10-CM | POA: Diagnosis not present

## 2023-12-20 DIAGNOSIS — E854 Organ-limited amyloidosis: Secondary | ICD-10-CM | POA: Diagnosis not present

## 2023-12-20 DIAGNOSIS — I5082 Biventricular heart failure: Secondary | ICD-10-CM | POA: Diagnosis not present

## 2023-12-20 DIAGNOSIS — G3184 Mild cognitive impairment, so stated: Secondary | ICD-10-CM | POA: Diagnosis not present

## 2023-12-20 DIAGNOSIS — I5022 Chronic systolic (congestive) heart failure: Secondary | ICD-10-CM | POA: Diagnosis not present

## 2023-12-21 DIAGNOSIS — L97218 Non-pressure chronic ulcer of right calf with other specified severity: Secondary | ICD-10-CM | POA: Diagnosis not present

## 2023-12-21 DIAGNOSIS — I10 Essential (primary) hypertension: Secondary | ICD-10-CM | POA: Diagnosis not present

## 2023-12-21 DIAGNOSIS — E1129 Type 2 diabetes mellitus with other diabetic kidney complication: Secondary | ICD-10-CM | POA: Diagnosis not present

## 2023-12-21 DIAGNOSIS — E785 Hyperlipidemia, unspecified: Secondary | ICD-10-CM | POA: Diagnosis not present

## 2023-12-22 DIAGNOSIS — N1832 Chronic kidney disease, stage 3b: Secondary | ICD-10-CM | POA: Diagnosis not present

## 2023-12-22 DIAGNOSIS — N3289 Other specified disorders of bladder: Secondary | ICD-10-CM | POA: Diagnosis not present

## 2023-12-22 DIAGNOSIS — R7989 Other specified abnormal findings of blood chemistry: Secondary | ICD-10-CM | POA: Diagnosis not present

## 2023-12-22 DIAGNOSIS — I5022 Chronic systolic (congestive) heart failure: Secondary | ICD-10-CM | POA: Diagnosis not present

## 2023-12-22 DIAGNOSIS — I43 Cardiomyopathy in diseases classified elsewhere: Secondary | ICD-10-CM | POA: Diagnosis not present

## 2023-12-22 DIAGNOSIS — R911 Solitary pulmonary nodule: Secondary | ICD-10-CM | POA: Diagnosis not present

## 2023-12-22 DIAGNOSIS — I130 Hypertensive heart and chronic kidney disease with heart failure and stage 1 through stage 4 chronic kidney disease, or unspecified chronic kidney disease: Secondary | ICD-10-CM | POA: Diagnosis not present

## 2023-12-22 DIAGNOSIS — H44519 Absolute glaucoma, unspecified eye: Secondary | ICD-10-CM | POA: Diagnosis not present

## 2023-12-22 DIAGNOSIS — I5082 Biventricular heart failure: Secondary | ICD-10-CM | POA: Diagnosis not present

## 2023-12-22 DIAGNOSIS — F05 Delirium due to known physiological condition: Secondary | ICD-10-CM | POA: Diagnosis not present

## 2023-12-22 DIAGNOSIS — E854 Organ-limited amyloidosis: Secondary | ICD-10-CM | POA: Diagnosis not present

## 2023-12-23 DIAGNOSIS — R7989 Other specified abnormal findings of blood chemistry: Secondary | ICD-10-CM | POA: Diagnosis not present

## 2023-12-23 DIAGNOSIS — I43 Cardiomyopathy in diseases classified elsewhere: Secondary | ICD-10-CM | POA: Diagnosis not present

## 2023-12-23 DIAGNOSIS — N1832 Chronic kidney disease, stage 3b: Secondary | ICD-10-CM | POA: Diagnosis not present

## 2023-12-23 DIAGNOSIS — F05 Delirium due to known physiological condition: Secondary | ICD-10-CM | POA: Diagnosis not present

## 2023-12-23 DIAGNOSIS — I10 Essential (primary) hypertension: Secondary | ICD-10-CM | POA: Diagnosis not present

## 2023-12-23 DIAGNOSIS — I5022 Chronic systolic (congestive) heart failure: Secondary | ICD-10-CM | POA: Diagnosis not present

## 2023-12-23 DIAGNOSIS — E854 Organ-limited amyloidosis: Secondary | ICD-10-CM | POA: Diagnosis not present

## 2023-12-23 DIAGNOSIS — N3289 Other specified disorders of bladder: Secondary | ICD-10-CM | POA: Diagnosis not present

## 2023-12-23 DIAGNOSIS — I5082 Biventricular heart failure: Secondary | ICD-10-CM | POA: Diagnosis not present

## 2023-12-23 DIAGNOSIS — H44519 Absolute glaucoma, unspecified eye: Secondary | ICD-10-CM | POA: Diagnosis not present

## 2023-12-23 DIAGNOSIS — R911 Solitary pulmonary nodule: Secondary | ICD-10-CM | POA: Diagnosis not present

## 2023-12-23 DIAGNOSIS — I509 Heart failure, unspecified: Secondary | ICD-10-CM | POA: Diagnosis not present

## 2023-12-24 DIAGNOSIS — I5022 Chronic systolic (congestive) heart failure: Secondary | ICD-10-CM | POA: Diagnosis not present

## 2023-12-24 DIAGNOSIS — N1832 Chronic kidney disease, stage 3b: Secondary | ICD-10-CM | POA: Diagnosis not present

## 2023-12-24 DIAGNOSIS — E854 Organ-limited amyloidosis: Secondary | ICD-10-CM | POA: Diagnosis not present

## 2023-12-24 DIAGNOSIS — I10 Essential (primary) hypertension: Secondary | ICD-10-CM | POA: Diagnosis not present

## 2023-12-24 DIAGNOSIS — R7989 Other specified abnormal findings of blood chemistry: Secondary | ICD-10-CM | POA: Diagnosis not present

## 2023-12-24 DIAGNOSIS — I43 Cardiomyopathy in diseases classified elsewhere: Secondary | ICD-10-CM | POA: Diagnosis not present

## 2023-12-24 DIAGNOSIS — I509 Heart failure, unspecified: Secondary | ICD-10-CM | POA: Diagnosis not present

## 2023-12-24 DIAGNOSIS — F05 Delirium due to known physiological condition: Secondary | ICD-10-CM | POA: Diagnosis not present

## 2023-12-24 DIAGNOSIS — H44519 Absolute glaucoma, unspecified eye: Secondary | ICD-10-CM | POA: Diagnosis not present

## 2023-12-24 DIAGNOSIS — N3289 Other specified disorders of bladder: Secondary | ICD-10-CM | POA: Diagnosis not present

## 2023-12-24 DIAGNOSIS — R911 Solitary pulmonary nodule: Secondary | ICD-10-CM | POA: Diagnosis not present

## 2023-12-24 DIAGNOSIS — I5082 Biventricular heart failure: Secondary | ICD-10-CM | POA: Diagnosis not present

## 2023-12-27 DIAGNOSIS — H44519 Absolute glaucoma, unspecified eye: Secondary | ICD-10-CM | POA: Diagnosis not present

## 2023-12-27 DIAGNOSIS — I5082 Biventricular heart failure: Secondary | ICD-10-CM | POA: Diagnosis not present

## 2023-12-27 DIAGNOSIS — N3289 Other specified disorders of bladder: Secondary | ICD-10-CM | POA: Diagnosis not present

## 2023-12-27 DIAGNOSIS — N1832 Chronic kidney disease, stage 3b: Secondary | ICD-10-CM | POA: Diagnosis not present

## 2023-12-27 DIAGNOSIS — E854 Organ-limited amyloidosis: Secondary | ICD-10-CM | POA: Diagnosis not present

## 2023-12-27 DIAGNOSIS — I5022 Chronic systolic (congestive) heart failure: Secondary | ICD-10-CM | POA: Diagnosis not present

## 2023-12-27 DIAGNOSIS — R911 Solitary pulmonary nodule: Secondary | ICD-10-CM | POA: Diagnosis not present

## 2023-12-27 DIAGNOSIS — I130 Hypertensive heart and chronic kidney disease with heart failure and stage 1 through stage 4 chronic kidney disease, or unspecified chronic kidney disease: Secondary | ICD-10-CM | POA: Diagnosis not present

## 2023-12-27 DIAGNOSIS — F05 Delirium due to known physiological condition: Secondary | ICD-10-CM | POA: Diagnosis not present

## 2023-12-27 DIAGNOSIS — I43 Cardiomyopathy in diseases classified elsewhere: Secondary | ICD-10-CM | POA: Diagnosis not present

## 2023-12-27 DIAGNOSIS — I509 Heart failure, unspecified: Secondary | ICD-10-CM | POA: Diagnosis not present

## 2023-12-27 DIAGNOSIS — R7989 Other specified abnormal findings of blood chemistry: Secondary | ICD-10-CM | POA: Diagnosis not present

## 2023-12-29 DIAGNOSIS — F05 Delirium due to known physiological condition: Secondary | ICD-10-CM | POA: Diagnosis not present

## 2023-12-29 DIAGNOSIS — R911 Solitary pulmonary nodule: Secondary | ICD-10-CM | POA: Diagnosis not present

## 2023-12-29 DIAGNOSIS — N3289 Other specified disorders of bladder: Secondary | ICD-10-CM | POA: Diagnosis not present

## 2023-12-29 DIAGNOSIS — N1832 Chronic kidney disease, stage 3b: Secondary | ICD-10-CM | POA: Diagnosis not present

## 2023-12-29 DIAGNOSIS — E854 Organ-limited amyloidosis: Secondary | ICD-10-CM | POA: Diagnosis not present

## 2023-12-29 DIAGNOSIS — R7989 Other specified abnormal findings of blood chemistry: Secondary | ICD-10-CM | POA: Diagnosis not present

## 2023-12-29 DIAGNOSIS — H44519 Absolute glaucoma, unspecified eye: Secondary | ICD-10-CM | POA: Diagnosis not present

## 2023-12-29 DIAGNOSIS — I43 Cardiomyopathy in diseases classified elsewhere: Secondary | ICD-10-CM | POA: Diagnosis not present

## 2023-12-29 DIAGNOSIS — I130 Hypertensive heart and chronic kidney disease with heart failure and stage 1 through stage 4 chronic kidney disease, or unspecified chronic kidney disease: Secondary | ICD-10-CM | POA: Diagnosis not present

## 2023-12-29 DIAGNOSIS — I5082 Biventricular heart failure: Secondary | ICD-10-CM | POA: Diagnosis not present

## 2023-12-29 DIAGNOSIS — I5022 Chronic systolic (congestive) heart failure: Secondary | ICD-10-CM | POA: Diagnosis not present

## 2023-12-31 DIAGNOSIS — N1832 Chronic kidney disease, stage 3b: Secondary | ICD-10-CM | POA: Diagnosis not present

## 2023-12-31 DIAGNOSIS — I5022 Chronic systolic (congestive) heart failure: Secondary | ICD-10-CM | POA: Diagnosis not present

## 2023-12-31 DIAGNOSIS — R7989 Other specified abnormal findings of blood chemistry: Secondary | ICD-10-CM | POA: Diagnosis not present

## 2023-12-31 DIAGNOSIS — N3289 Other specified disorders of bladder: Secondary | ICD-10-CM | POA: Diagnosis not present

## 2023-12-31 DIAGNOSIS — E854 Organ-limited amyloidosis: Secondary | ICD-10-CM | POA: Diagnosis not present

## 2023-12-31 DIAGNOSIS — H44519 Absolute glaucoma, unspecified eye: Secondary | ICD-10-CM | POA: Diagnosis not present

## 2023-12-31 DIAGNOSIS — F05 Delirium due to known physiological condition: Secondary | ICD-10-CM | POA: Diagnosis not present

## 2023-12-31 DIAGNOSIS — I509 Heart failure, unspecified: Secondary | ICD-10-CM | POA: Diagnosis not present

## 2023-12-31 DIAGNOSIS — I5082 Biventricular heart failure: Secondary | ICD-10-CM | POA: Diagnosis not present

## 2023-12-31 DIAGNOSIS — I10 Essential (primary) hypertension: Secondary | ICD-10-CM | POA: Diagnosis not present

## 2023-12-31 DIAGNOSIS — R911 Solitary pulmonary nodule: Secondary | ICD-10-CM | POA: Diagnosis not present

## 2023-12-31 DIAGNOSIS — I43 Cardiomyopathy in diseases classified elsewhere: Secondary | ICD-10-CM | POA: Diagnosis not present

## 2024-01-01 DIAGNOSIS — N3289 Other specified disorders of bladder: Secondary | ICD-10-CM | POA: Diagnosis not present

## 2024-01-01 DIAGNOSIS — I5082 Biventricular heart failure: Secondary | ICD-10-CM | POA: Diagnosis not present

## 2024-01-01 DIAGNOSIS — N1832 Chronic kidney disease, stage 3b: Secondary | ICD-10-CM | POA: Diagnosis not present

## 2024-01-01 DIAGNOSIS — I43 Cardiomyopathy in diseases classified elsewhere: Secondary | ICD-10-CM | POA: Diagnosis not present

## 2024-01-01 DIAGNOSIS — R7989 Other specified abnormal findings of blood chemistry: Secondary | ICD-10-CM | POA: Diagnosis not present

## 2024-01-01 DIAGNOSIS — R911 Solitary pulmonary nodule: Secondary | ICD-10-CM | POA: Diagnosis not present

## 2024-01-01 DIAGNOSIS — I5022 Chronic systolic (congestive) heart failure: Secondary | ICD-10-CM | POA: Diagnosis not present

## 2024-01-01 DIAGNOSIS — H44519 Absolute glaucoma, unspecified eye: Secondary | ICD-10-CM | POA: Diagnosis not present

## 2024-01-01 DIAGNOSIS — I509 Heart failure, unspecified: Secondary | ICD-10-CM | POA: Diagnosis not present

## 2024-01-01 DIAGNOSIS — F05 Delirium due to known physiological condition: Secondary | ICD-10-CM | POA: Diagnosis not present

## 2024-01-01 DIAGNOSIS — E854 Organ-limited amyloidosis: Secondary | ICD-10-CM | POA: Diagnosis not present

## 2024-01-01 DIAGNOSIS — I10 Essential (primary) hypertension: Secondary | ICD-10-CM | POA: Diagnosis not present

## 2024-01-03 DIAGNOSIS — I5082 Biventricular heart failure: Secondary | ICD-10-CM | POA: Diagnosis not present

## 2024-01-03 DIAGNOSIS — I5022 Chronic systolic (congestive) heart failure: Secondary | ICD-10-CM | POA: Diagnosis not present

## 2024-01-03 DIAGNOSIS — N1832 Chronic kidney disease, stage 3b: Secondary | ICD-10-CM | POA: Diagnosis not present

## 2024-01-03 DIAGNOSIS — I43 Cardiomyopathy in diseases classified elsewhere: Secondary | ICD-10-CM | POA: Diagnosis not present

## 2024-01-03 DIAGNOSIS — F05 Delirium due to known physiological condition: Secondary | ICD-10-CM | POA: Diagnosis not present

## 2024-01-03 DIAGNOSIS — I10 Essential (primary) hypertension: Secondary | ICD-10-CM | POA: Diagnosis not present

## 2024-01-03 DIAGNOSIS — N3289 Other specified disorders of bladder: Secondary | ICD-10-CM | POA: Diagnosis not present

## 2024-01-03 DIAGNOSIS — E854 Organ-limited amyloidosis: Secondary | ICD-10-CM | POA: Diagnosis not present

## 2024-01-03 DIAGNOSIS — R7989 Other specified abnormal findings of blood chemistry: Secondary | ICD-10-CM | POA: Diagnosis not present

## 2024-01-03 DIAGNOSIS — R911 Solitary pulmonary nodule: Secondary | ICD-10-CM | POA: Diagnosis not present

## 2024-01-03 DIAGNOSIS — I509 Heart failure, unspecified: Secondary | ICD-10-CM | POA: Diagnosis not present

## 2024-01-03 DIAGNOSIS — H44519 Absolute glaucoma, unspecified eye: Secondary | ICD-10-CM | POA: Diagnosis not present

## 2024-01-04 ENCOUNTER — Ambulatory Visit: Payer: Medicare Other | Attending: Genetic Counselor | Admitting: Genetic Counselor

## 2024-01-04 DIAGNOSIS — E854 Organ-limited amyloidosis: Secondary | ICD-10-CM | POA: Diagnosis not present

## 2024-01-04 DIAGNOSIS — I5082 Biventricular heart failure: Secondary | ICD-10-CM | POA: Diagnosis not present

## 2024-01-04 DIAGNOSIS — I5022 Chronic systolic (congestive) heart failure: Secondary | ICD-10-CM | POA: Diagnosis not present

## 2024-01-04 DIAGNOSIS — F05 Delirium due to known physiological condition: Secondary | ICD-10-CM | POA: Diagnosis not present

## 2024-01-04 DIAGNOSIS — I43 Cardiomyopathy in diseases classified elsewhere: Secondary | ICD-10-CM | POA: Diagnosis not present

## 2024-01-04 DIAGNOSIS — N1832 Chronic kidney disease, stage 3b: Secondary | ICD-10-CM | POA: Diagnosis not present

## 2024-01-04 DIAGNOSIS — N3289 Other specified disorders of bladder: Secondary | ICD-10-CM | POA: Diagnosis not present

## 2024-01-04 DIAGNOSIS — R911 Solitary pulmonary nodule: Secondary | ICD-10-CM | POA: Diagnosis not present

## 2024-01-04 DIAGNOSIS — H44519 Absolute glaucoma, unspecified eye: Secondary | ICD-10-CM | POA: Diagnosis not present

## 2024-01-04 DIAGNOSIS — I130 Hypertensive heart and chronic kidney disease with heart failure and stage 1 through stage 4 chronic kidney disease, or unspecified chronic kidney disease: Secondary | ICD-10-CM | POA: Diagnosis not present

## 2024-01-04 DIAGNOSIS — R7989 Other specified abnormal findings of blood chemistry: Secondary | ICD-10-CM | POA: Diagnosis not present

## 2024-01-05 DIAGNOSIS — I5022 Chronic systolic (congestive) heart failure: Secondary | ICD-10-CM | POA: Diagnosis not present

## 2024-01-05 DIAGNOSIS — N3289 Other specified disorders of bladder: Secondary | ICD-10-CM | POA: Diagnosis not present

## 2024-01-05 DIAGNOSIS — I43 Cardiomyopathy in diseases classified elsewhere: Secondary | ICD-10-CM | POA: Diagnosis not present

## 2024-01-05 DIAGNOSIS — I130 Hypertensive heart and chronic kidney disease with heart failure and stage 1 through stage 4 chronic kidney disease, or unspecified chronic kidney disease: Secondary | ICD-10-CM | POA: Diagnosis not present

## 2024-01-05 DIAGNOSIS — I5082 Biventricular heart failure: Secondary | ICD-10-CM | POA: Diagnosis not present

## 2024-01-05 DIAGNOSIS — E854 Organ-limited amyloidosis: Secondary | ICD-10-CM | POA: Diagnosis not present

## 2024-01-05 DIAGNOSIS — R7989 Other specified abnormal findings of blood chemistry: Secondary | ICD-10-CM | POA: Diagnosis not present

## 2024-01-05 DIAGNOSIS — F05 Delirium due to known physiological condition: Secondary | ICD-10-CM | POA: Diagnosis not present

## 2024-01-05 DIAGNOSIS — R911 Solitary pulmonary nodule: Secondary | ICD-10-CM | POA: Diagnosis not present

## 2024-01-05 DIAGNOSIS — N1832 Chronic kidney disease, stage 3b: Secondary | ICD-10-CM | POA: Diagnosis not present

## 2024-01-05 DIAGNOSIS — H44519 Absolute glaucoma, unspecified eye: Secondary | ICD-10-CM | POA: Diagnosis not present

## 2024-01-05 MED FILL — Medication: Qty: 1 | Status: AC

## 2024-01-06 ENCOUNTER — Other Ambulatory Visit: Payer: Self-pay

## 2024-01-06 NOTE — Progress Notes (Signed)
Specialty Pharmacy Refill Coordination Note  Steven Myers is a 89 y.o. male contacted today regarding refills of specialty medication(s) Tafamidis Meglumine (Cardiac) [name]) Spoke with patient's wife  Patient requested Delivery   Delivery date: 01/13/24   Verified address: [address], [place] [address]   Medication will be filled on 02.19.25.

## 2024-01-07 DIAGNOSIS — I5022 Chronic systolic (congestive) heart failure: Secondary | ICD-10-CM | POA: Diagnosis not present

## 2024-01-07 DIAGNOSIS — I5082 Biventricular heart failure: Secondary | ICD-10-CM | POA: Diagnosis not present

## 2024-01-07 DIAGNOSIS — R7989 Other specified abnormal findings of blood chemistry: Secondary | ICD-10-CM | POA: Diagnosis not present

## 2024-01-07 DIAGNOSIS — I10 Essential (primary) hypertension: Secondary | ICD-10-CM | POA: Diagnosis not present

## 2024-01-07 DIAGNOSIS — I43 Cardiomyopathy in diseases classified elsewhere: Secondary | ICD-10-CM | POA: Diagnosis not present

## 2024-01-07 DIAGNOSIS — F05 Delirium due to known physiological condition: Secondary | ICD-10-CM | POA: Diagnosis not present

## 2024-01-07 DIAGNOSIS — R911 Solitary pulmonary nodule: Secondary | ICD-10-CM | POA: Diagnosis not present

## 2024-01-07 DIAGNOSIS — N3289 Other specified disorders of bladder: Secondary | ICD-10-CM | POA: Diagnosis not present

## 2024-01-07 DIAGNOSIS — I509 Heart failure, unspecified: Secondary | ICD-10-CM | POA: Diagnosis not present

## 2024-01-07 DIAGNOSIS — E854 Organ-limited amyloidosis: Secondary | ICD-10-CM | POA: Diagnosis not present

## 2024-01-07 DIAGNOSIS — N1832 Chronic kidney disease, stage 3b: Secondary | ICD-10-CM | POA: Diagnosis not present

## 2024-01-07 DIAGNOSIS — H44519 Absolute glaucoma, unspecified eye: Secondary | ICD-10-CM | POA: Diagnosis not present

## 2024-01-07 NOTE — Progress Notes (Signed)
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 African American gentleman who is here today with his daughter Steven Myers and her husband Steven Myers.    In 2023, he was referred to a cardiologist by his PCP for worsening dyspnea and underwent cardiac imaging studies. TTE demonstrated severe LVH with apical thrombus, LVEF of 45-50% and a speckling pattern suggestive of cardiac amyloidosis. In August 2024, he underwent further tests that rules out AL amyloid and positive PYP scan was noted raising the possibility of wt or mutant amyloid.   He reports having several syncope events in 2018 and 2019 that were typically associated with excessive sweating. EMD was summoned don several occasions but nothing was detected at that time.   He denies having neuropathy or carpal tunnel syndrome. Does report having glaucoma and is seen by a ophthalmologist at [facility] for this.    Family history  Relation to Proband Pedigree # Current age Heart condition/age of onset Notes  Daughter III.3 59 None No children  Son III.4 55 None   Grandchildren, 3 IV.1-IV.3 21, 19, 15 None         Brother II.1 Deceased ? Died of heart issues in his 13s sarcoidosis  Nephew, niece III.1, III.2 67, 55 None         Father I.1 Deceased HF @ 29 Died of HF @ 73  Mother I.2 Deceased None Died @ 96- old age   Genetic Consultation Notes  I explained to them that amyloid fibrils can deposit in different tissues of the heart due to an incorrectly folded wild type transthyretin protein or misfolding resulting from a mutation in the transthyretin protein. The familial TTR amyloidosis (FTA) is due to a mutation in TTR gene that destabilizes the protein causing it to misfold resulting in amyloid fibrils. These fibrils deposit in  different tissues, such as the peripheral nervous system, brain and heart. Amyloid deposits in these tissues can lead to numbness in the lower limbs, feet and hands, stroke or left ventricular hypertrophy. He verbalized understanding of this.  Treatment options are available to stabilize the TTR tetramer and were also briefly reviewed. He is on Vyndamax (tafamidis), a drug that stabilizes the TTR tetramer thus preventing it from forming fibrils that are stored in cells.  I explained to the patient that this is an autosomal dominant condition with incomplete penetrance i.e. not all individuals harboring a mutation will present clinically in their early- mid adulthood, but will manifest as they age. Disease penetrance and variability in clinical expression varies widely between families and the prevalence of FTA differs greatly among the family members.   Since FTA is an autosomal dominant condition, first degree-relatives are at a 50% risk of inheriting this condition and should seek regular surveillance.  First-degree relatives include his daughter and son. At this time his grandchildren do not need to undergo screening- they can be screened if they are symptomatic or if their parent is found to have the mutation in TTR gene.  Clinical screening of first-degree relatives is recommended based on current guidelines. Patient verbalized understanding of this.  I discussed the outcomes of genetic testing. If his condition is due to a misfolded wild type transthyretin protein then, we can expect him to have a negative genetic result. If a mutation is not identified, then his first-degree  relatives need not undergo screening as he does not have the familial condition.   There is also the likelihood of identifying a "Variant of unknown significance." This result means that the variant has not been detected in a statistically significant number of patients and/or functional studies have not been performed to verify  its pathogenicity. This VUS can be tested in the family to see if it segregates with disease. If a VUS is found, first-degree relatives should undergo regular clinical screening for FTA.  If a pathogenic variant is reported, then first-degree family members can get tested for this variant. If they test positive, it is likely they will develop FTA. In light of variable expression and incomplete penetrance associated with this condition, it is not possible to predict when they will manifest clinically. It is recommended that family members that test positive for the familial pathogenic variant pursue clinical screening for FTA. Family members that test negative for the familial mutation are deferred from further screening.  Impression  In summary, Steven Myers exhibits the characteristic features of a genetic disease- early age and severity of presentation. While there is no overt history of heart disease in his family, he reports heart failure in is father at 60.   Genetic testing for the TTR gene implicated in FTA is highly recommended to confirm the genetic etiology of his condition. This genetic test will confirm his diagnosis to facilitate appropriate management strategies.  Importantly, identifying the causative pathogenic variant will enable cascade testing in the family so that appropriate cardiology surveillance and lifestyle management can be directed to the genotype-positive family members.   In addition, we discussed the protections afforded by the Genetic Information Non-Discrimination Act (GINA). I explained to the patient that GINA protects them from losing their employment or health insurance based on their genotype. However, these protections do not cover life insurance and disability. Explained to the patient that family members that are found to have the familial genetic mutation will be denied life insurance even if they are asymptomatic and do not exhibit clinical signs of HCM. He verbalized  understanding.    Please note that the patient has not been counseled in this visit on other personal, cultural, or ethical issues that they may face due to their heart condition.   Plan After a thorough discussion of the risk and benefits of genetic testing for FTA, [place] expresses interest in pursuing genetic testing for FTA and signed the informed consent. Blood was drawn today for genetic testing.  Sidney Ace, Ph.D, [facility] Clinical Molecular Geneticist

## 2024-01-10 ENCOUNTER — Other Ambulatory Visit: Payer: Self-pay

## 2024-01-10 DIAGNOSIS — N3289 Other specified disorders of bladder: Secondary | ICD-10-CM | POA: Diagnosis not present

## 2024-01-10 DIAGNOSIS — N1832 Chronic kidney disease, stage 3b: Secondary | ICD-10-CM | POA: Diagnosis not present

## 2024-01-10 DIAGNOSIS — F05 Delirium due to known physiological condition: Secondary | ICD-10-CM | POA: Diagnosis not present

## 2024-01-10 DIAGNOSIS — I5082 Biventricular heart failure: Secondary | ICD-10-CM | POA: Diagnosis not present

## 2024-01-10 DIAGNOSIS — R911 Solitary pulmonary nodule: Secondary | ICD-10-CM | POA: Diagnosis not present

## 2024-01-10 DIAGNOSIS — H44519 Absolute glaucoma, unspecified eye: Secondary | ICD-10-CM | POA: Diagnosis not present

## 2024-01-10 DIAGNOSIS — I509 Heart failure, unspecified: Secondary | ICD-10-CM | POA: Diagnosis not present

## 2024-01-10 DIAGNOSIS — I43 Cardiomyopathy in diseases classified elsewhere: Secondary | ICD-10-CM | POA: Diagnosis not present

## 2024-01-10 DIAGNOSIS — E854 Organ-limited amyloidosis: Secondary | ICD-10-CM | POA: Diagnosis not present

## 2024-01-10 DIAGNOSIS — R7989 Other specified abnormal findings of blood chemistry: Secondary | ICD-10-CM | POA: Diagnosis not present

## 2024-01-10 DIAGNOSIS — I130 Hypertensive heart and chronic kidney disease with heart failure and stage 1 through stage 4 chronic kidney disease, or unspecified chronic kidney disease: Secondary | ICD-10-CM | POA: Diagnosis not present

## 2024-01-10 DIAGNOSIS — I5022 Chronic systolic (congestive) heart failure: Secondary | ICD-10-CM | POA: Diagnosis not present

## 2024-01-10 NOTE — Progress Notes (Signed)
Patient was contacted via mychart that due to possible impending winter storm, medication will arrive on Tuesday 01/11/24.

## 2024-01-11 DIAGNOSIS — I10 Essential (primary) hypertension: Secondary | ICD-10-CM | POA: Diagnosis not present

## 2024-01-11 DIAGNOSIS — N3289 Other specified disorders of bladder: Secondary | ICD-10-CM | POA: Diagnosis not present

## 2024-01-11 DIAGNOSIS — F05 Delirium due to known physiological condition: Secondary | ICD-10-CM | POA: Diagnosis not present

## 2024-01-11 DIAGNOSIS — R911 Solitary pulmonary nodule: Secondary | ICD-10-CM | POA: Diagnosis not present

## 2024-01-11 DIAGNOSIS — E854 Organ-limited amyloidosis: Secondary | ICD-10-CM | POA: Diagnosis not present

## 2024-01-11 DIAGNOSIS — I5082 Biventricular heart failure: Secondary | ICD-10-CM | POA: Diagnosis not present

## 2024-01-11 DIAGNOSIS — I509 Heart failure, unspecified: Secondary | ICD-10-CM | POA: Diagnosis not present

## 2024-01-11 DIAGNOSIS — N1832 Chronic kidney disease, stage 3b: Secondary | ICD-10-CM | POA: Diagnosis not present

## 2024-01-11 DIAGNOSIS — R7989 Other specified abnormal findings of blood chemistry: Secondary | ICD-10-CM | POA: Diagnosis not present

## 2024-01-11 DIAGNOSIS — I5022 Chronic systolic (congestive) heart failure: Secondary | ICD-10-CM | POA: Diagnosis not present

## 2024-01-11 DIAGNOSIS — I43 Cardiomyopathy in diseases classified elsewhere: Secondary | ICD-10-CM | POA: Diagnosis not present

## 2024-01-11 DIAGNOSIS — H44519 Absolute glaucoma, unspecified eye: Secondary | ICD-10-CM | POA: Diagnosis not present

## 2024-01-12 ENCOUNTER — Other Ambulatory Visit: Payer: Self-pay

## 2024-01-12 ENCOUNTER — Other Ambulatory Visit (HOSPITAL_COMMUNITY): Payer: Self-pay

## 2024-01-12 DIAGNOSIS — I5022 Chronic systolic (congestive) heart failure: Secondary | ICD-10-CM | POA: Diagnosis not present

## 2024-01-12 DIAGNOSIS — R7989 Other specified abnormal findings of blood chemistry: Secondary | ICD-10-CM | POA: Diagnosis not present

## 2024-01-12 DIAGNOSIS — I130 Hypertensive heart and chronic kidney disease with heart failure and stage 1 through stage 4 chronic kidney disease, or unspecified chronic kidney disease: Secondary | ICD-10-CM | POA: Diagnosis not present

## 2024-01-12 DIAGNOSIS — I43 Cardiomyopathy in diseases classified elsewhere: Secondary | ICD-10-CM | POA: Diagnosis not present

## 2024-01-12 DIAGNOSIS — R911 Solitary pulmonary nodule: Secondary | ICD-10-CM | POA: Diagnosis not present

## 2024-01-12 DIAGNOSIS — N1832 Chronic kidney disease, stage 3b: Secondary | ICD-10-CM | POA: Diagnosis not present

## 2024-01-12 DIAGNOSIS — F05 Delirium due to known physiological condition: Secondary | ICD-10-CM | POA: Diagnosis not present

## 2024-01-12 DIAGNOSIS — I5082 Biventricular heart failure: Secondary | ICD-10-CM | POA: Diagnosis not present

## 2024-01-12 DIAGNOSIS — H44519 Absolute glaucoma, unspecified eye: Secondary | ICD-10-CM | POA: Diagnosis not present

## 2024-01-12 DIAGNOSIS — E854 Organ-limited amyloidosis: Secondary | ICD-10-CM | POA: Diagnosis not present

## 2024-01-12 DIAGNOSIS — N3289 Other specified disorders of bladder: Secondary | ICD-10-CM | POA: Diagnosis not present

## 2024-01-12 NOTE — Progress Notes (Signed)
[facility] did not deliver Vyndaqel on 2/18. Per tracking, order is on the truck and out for delivery. Patient expressed understanding.

## 2024-01-14 DIAGNOSIS — I5022 Chronic systolic (congestive) heart failure: Secondary | ICD-10-CM | POA: Diagnosis not present

## 2024-01-14 DIAGNOSIS — F05 Delirium due to known physiological condition: Secondary | ICD-10-CM | POA: Diagnosis not present

## 2024-01-14 DIAGNOSIS — R911 Solitary pulmonary nodule: Secondary | ICD-10-CM | POA: Diagnosis not present

## 2024-01-14 DIAGNOSIS — N1832 Chronic kidney disease, stage 3b: Secondary | ICD-10-CM | POA: Diagnosis not present

## 2024-01-14 DIAGNOSIS — I5082 Biventricular heart failure: Secondary | ICD-10-CM | POA: Diagnosis not present

## 2024-01-14 DIAGNOSIS — E854 Organ-limited amyloidosis: Secondary | ICD-10-CM | POA: Diagnosis not present

## 2024-01-14 DIAGNOSIS — I509 Heart failure, unspecified: Secondary | ICD-10-CM | POA: Diagnosis not present

## 2024-01-14 DIAGNOSIS — H44519 Absolute glaucoma, unspecified eye: Secondary | ICD-10-CM | POA: Diagnosis not present

## 2024-01-14 DIAGNOSIS — R7989 Other specified abnormal findings of blood chemistry: Secondary | ICD-10-CM | POA: Diagnosis not present

## 2024-01-14 DIAGNOSIS — I10 Essential (primary) hypertension: Secondary | ICD-10-CM | POA: Diagnosis not present

## 2024-01-14 DIAGNOSIS — I43 Cardiomyopathy in diseases classified elsewhere: Secondary | ICD-10-CM | POA: Diagnosis not present

## 2024-01-14 DIAGNOSIS — N3289 Other specified disorders of bladder: Secondary | ICD-10-CM | POA: Diagnosis not present

## 2024-01-17 DIAGNOSIS — I43 Cardiomyopathy in diseases classified elsewhere: Secondary | ICD-10-CM | POA: Diagnosis not present

## 2024-01-17 DIAGNOSIS — E854 Organ-limited amyloidosis: Secondary | ICD-10-CM | POA: Diagnosis not present

## 2024-01-17 DIAGNOSIS — N1832 Chronic kidney disease, stage 3b: Secondary | ICD-10-CM | POA: Diagnosis not present

## 2024-01-17 DIAGNOSIS — I10 Essential (primary) hypertension: Secondary | ICD-10-CM | POA: Diagnosis not present

## 2024-01-17 DIAGNOSIS — I509 Heart failure, unspecified: Secondary | ICD-10-CM | POA: Diagnosis not present

## 2024-01-17 DIAGNOSIS — N3289 Other specified disorders of bladder: Secondary | ICD-10-CM | POA: Diagnosis not present

## 2024-01-17 DIAGNOSIS — R911 Solitary pulmonary nodule: Secondary | ICD-10-CM | POA: Diagnosis not present

## 2024-01-17 DIAGNOSIS — I5022 Chronic systolic (congestive) heart failure: Secondary | ICD-10-CM | POA: Diagnosis not present

## 2024-01-17 DIAGNOSIS — H44519 Absolute glaucoma, unspecified eye: Secondary | ICD-10-CM | POA: Diagnosis not present

## 2024-01-17 DIAGNOSIS — I5082 Biventricular heart failure: Secondary | ICD-10-CM | POA: Diagnosis not present

## 2024-01-17 DIAGNOSIS — R7989 Other specified abnormal findings of blood chemistry: Secondary | ICD-10-CM | POA: Diagnosis not present

## 2024-01-17 DIAGNOSIS — F05 Delirium due to known physiological condition: Secondary | ICD-10-CM | POA: Diagnosis not present

## 2024-01-19 DIAGNOSIS — R911 Solitary pulmonary nodule: Secondary | ICD-10-CM | POA: Diagnosis not present

## 2024-01-19 DIAGNOSIS — I5082 Biventricular heart failure: Secondary | ICD-10-CM | POA: Diagnosis not present

## 2024-01-19 DIAGNOSIS — H44519 Absolute glaucoma, unspecified eye: Secondary | ICD-10-CM | POA: Diagnosis not present

## 2024-01-19 DIAGNOSIS — I43 Cardiomyopathy in diseases classified elsewhere: Secondary | ICD-10-CM | POA: Diagnosis not present

## 2024-01-19 DIAGNOSIS — R7989 Other specified abnormal findings of blood chemistry: Secondary | ICD-10-CM | POA: Diagnosis not present

## 2024-01-19 DIAGNOSIS — N3289 Other specified disorders of bladder: Secondary | ICD-10-CM | POA: Diagnosis not present

## 2024-01-19 DIAGNOSIS — I10 Essential (primary) hypertension: Secondary | ICD-10-CM | POA: Diagnosis not present

## 2024-01-19 DIAGNOSIS — I509 Heart failure, unspecified: Secondary | ICD-10-CM | POA: Diagnosis not present

## 2024-01-19 DIAGNOSIS — F05 Delirium due to known physiological condition: Secondary | ICD-10-CM | POA: Diagnosis not present

## 2024-01-19 DIAGNOSIS — I5022 Chronic systolic (congestive) heart failure: Secondary | ICD-10-CM | POA: Diagnosis not present

## 2024-01-19 DIAGNOSIS — N1832 Chronic kidney disease, stage 3b: Secondary | ICD-10-CM | POA: Diagnosis not present

## 2024-01-19 DIAGNOSIS — E854 Organ-limited amyloidosis: Secondary | ICD-10-CM | POA: Diagnosis not present

## 2024-01-21 ENCOUNTER — Telehealth: Payer: Self-pay | Admitting: Physician Assistant

## 2024-01-21 DIAGNOSIS — R7989 Other specified abnormal findings of blood chemistry: Secondary | ICD-10-CM | POA: Diagnosis not present

## 2024-01-21 DIAGNOSIS — N3289 Other specified disorders of bladder: Secondary | ICD-10-CM | POA: Diagnosis not present

## 2024-01-21 DIAGNOSIS — I509 Heart failure, unspecified: Secondary | ICD-10-CM | POA: Diagnosis not present

## 2024-01-21 DIAGNOSIS — I10 Essential (primary) hypertension: Secondary | ICD-10-CM | POA: Diagnosis not present

## 2024-01-21 DIAGNOSIS — R911 Solitary pulmonary nodule: Secondary | ICD-10-CM | POA: Diagnosis not present

## 2024-01-21 DIAGNOSIS — I5022 Chronic systolic (congestive) heart failure: Secondary | ICD-10-CM | POA: Diagnosis not present

## 2024-01-21 DIAGNOSIS — I5082 Biventricular heart failure: Secondary | ICD-10-CM | POA: Diagnosis not present

## 2024-01-21 DIAGNOSIS — H44519 Absolute glaucoma, unspecified eye: Secondary | ICD-10-CM | POA: Diagnosis not present

## 2024-01-21 DIAGNOSIS — F05 Delirium due to known physiological condition: Secondary | ICD-10-CM | POA: Diagnosis not present

## 2024-01-21 DIAGNOSIS — E854 Organ-limited amyloidosis: Secondary | ICD-10-CM | POA: Diagnosis not present

## 2024-01-21 DIAGNOSIS — N1832 Chronic kidney disease, stage 3b: Secondary | ICD-10-CM | POA: Diagnosis not present

## 2024-01-21 DIAGNOSIS — I43 Cardiomyopathy in diseases classified elsewhere: Secondary | ICD-10-CM | POA: Diagnosis not present

## 2024-01-21 NOTE — Telephone Encounter (Signed)
 Steven Myers has been made aware that pt can f/u with CHF clinic and appt with Lorin Picket 01/26/24 has been cancelled.

## 2024-01-21 NOTE — Telephone Encounter (Signed)
 Patient's daughter would like to speak with clinic staff to determine whether follow up with [name] is still necessary. She states she thought patient would only need to be seen by Heart Failure Clinic now. Please advise.

## 2024-01-21 NOTE — Telephone Encounter (Signed)
 That is correct. He can just follow up with Dr. Gasper Lloyd for now. The appt with me can be canceled. [name], PA-C    01/21/2024 11:00 AM

## 2024-01-24 DIAGNOSIS — E854 Organ-limited amyloidosis: Secondary | ICD-10-CM | POA: Diagnosis not present

## 2024-01-24 DIAGNOSIS — I10 Essential (primary) hypertension: Secondary | ICD-10-CM | POA: Diagnosis not present

## 2024-01-24 DIAGNOSIS — I509 Heart failure, unspecified: Secondary | ICD-10-CM | POA: Diagnosis not present

## 2024-01-24 DIAGNOSIS — N3289 Other specified disorders of bladder: Secondary | ICD-10-CM | POA: Diagnosis not present

## 2024-01-24 DIAGNOSIS — I5082 Biventricular heart failure: Secondary | ICD-10-CM | POA: Diagnosis not present

## 2024-01-24 DIAGNOSIS — H44519 Absolute glaucoma, unspecified eye: Secondary | ICD-10-CM | POA: Diagnosis not present

## 2024-01-24 DIAGNOSIS — N1832 Chronic kidney disease, stage 3b: Secondary | ICD-10-CM | POA: Diagnosis not present

## 2024-01-24 DIAGNOSIS — R911 Solitary pulmonary nodule: Secondary | ICD-10-CM | POA: Diagnosis not present

## 2024-01-24 DIAGNOSIS — F05 Delirium due to known physiological condition: Secondary | ICD-10-CM | POA: Diagnosis not present

## 2024-01-24 DIAGNOSIS — I43 Cardiomyopathy in diseases classified elsewhere: Secondary | ICD-10-CM | POA: Diagnosis not present

## 2024-01-24 DIAGNOSIS — I5022 Chronic systolic (congestive) heart failure: Secondary | ICD-10-CM | POA: Diagnosis not present

## 2024-01-24 DIAGNOSIS — R7989 Other specified abnormal findings of blood chemistry: Secondary | ICD-10-CM | POA: Diagnosis not present

## 2024-01-26 ENCOUNTER — Ambulatory Visit: Payer: Medicare Other | Admitting: Physician Assistant

## 2024-01-26 DIAGNOSIS — R911 Solitary pulmonary nodule: Secondary | ICD-10-CM | POA: Diagnosis not present

## 2024-01-26 DIAGNOSIS — F05 Delirium due to known physiological condition: Secondary | ICD-10-CM | POA: Diagnosis not present

## 2024-01-26 DIAGNOSIS — N3289 Other specified disorders of bladder: Secondary | ICD-10-CM | POA: Diagnosis not present

## 2024-01-26 DIAGNOSIS — I5022 Chronic systolic (congestive) heart failure: Secondary | ICD-10-CM | POA: Diagnosis not present

## 2024-01-26 DIAGNOSIS — H44519 Absolute glaucoma, unspecified eye: Secondary | ICD-10-CM | POA: Diagnosis not present

## 2024-01-26 DIAGNOSIS — N1832 Chronic kidney disease, stage 3b: Secondary | ICD-10-CM | POA: Diagnosis not present

## 2024-01-26 DIAGNOSIS — I509 Heart failure, unspecified: Secondary | ICD-10-CM | POA: Diagnosis not present

## 2024-01-26 DIAGNOSIS — I10 Essential (primary) hypertension: Secondary | ICD-10-CM | POA: Diagnosis not present

## 2024-01-26 DIAGNOSIS — E854 Organ-limited amyloidosis: Secondary | ICD-10-CM | POA: Diagnosis not present

## 2024-01-26 DIAGNOSIS — I43 Cardiomyopathy in diseases classified elsewhere: Secondary | ICD-10-CM | POA: Diagnosis not present

## 2024-01-26 DIAGNOSIS — I5082 Biventricular heart failure: Secondary | ICD-10-CM | POA: Diagnosis not present

## 2024-01-26 DIAGNOSIS — R7989 Other specified abnormal findings of blood chemistry: Secondary | ICD-10-CM | POA: Diagnosis not present

## 2024-01-28 ENCOUNTER — Ambulatory Visit: Payer: Medicare Other | Admitting: Podiatry

## 2024-01-28 DIAGNOSIS — H44519 Absolute glaucoma, unspecified eye: Secondary | ICD-10-CM | POA: Diagnosis not present

## 2024-01-28 DIAGNOSIS — R911 Solitary pulmonary nodule: Secondary | ICD-10-CM | POA: Diagnosis not present

## 2024-01-28 DIAGNOSIS — F05 Delirium due to known physiological condition: Secondary | ICD-10-CM | POA: Diagnosis not present

## 2024-01-28 DIAGNOSIS — N1832 Chronic kidney disease, stage 3b: Secondary | ICD-10-CM | POA: Diagnosis not present

## 2024-01-28 DIAGNOSIS — I5082 Biventricular heart failure: Secondary | ICD-10-CM | POA: Diagnosis not present

## 2024-01-28 DIAGNOSIS — I43 Cardiomyopathy in diseases classified elsewhere: Secondary | ICD-10-CM | POA: Diagnosis not present

## 2024-01-28 DIAGNOSIS — I509 Heart failure, unspecified: Secondary | ICD-10-CM | POA: Diagnosis not present

## 2024-01-28 DIAGNOSIS — N3289 Other specified disorders of bladder: Secondary | ICD-10-CM | POA: Diagnosis not present

## 2024-01-28 DIAGNOSIS — I10 Essential (primary) hypertension: Secondary | ICD-10-CM | POA: Diagnosis not present

## 2024-01-28 DIAGNOSIS — E854 Organ-limited amyloidosis: Secondary | ICD-10-CM | POA: Diagnosis not present

## 2024-01-28 DIAGNOSIS — R7989 Other specified abnormal findings of blood chemistry: Secondary | ICD-10-CM | POA: Diagnosis not present

## 2024-01-28 DIAGNOSIS — I5022 Chronic systolic (congestive) heart failure: Secondary | ICD-10-CM | POA: Diagnosis not present

## 2024-01-30 ENCOUNTER — Emergency Department (HOSPITAL_COMMUNITY)

## 2024-01-30 ENCOUNTER — Ambulatory Visit (HOSPITAL_COMMUNITY)

## 2024-01-30 ENCOUNTER — Inpatient Hospital Stay (HOSPITAL_COMMUNITY)
Admission: EM | Admit: 2024-01-30 | Discharge: 2024-02-05 | DRG: 545 | Disposition: A | Discharge status: Hospice | Source: Skilled Nursing Facility | Attending: Internal Medicine | Admitting: Internal Medicine

## 2024-01-30 DIAGNOSIS — Z4682 Encounter for fitting and adjustment of non-vascular catheter: Secondary | ICD-10-CM | POA: Diagnosis not present

## 2024-01-30 DIAGNOSIS — Z833 Family history of diabetes mellitus: Secondary | ICD-10-CM

## 2024-01-30 DIAGNOSIS — E1122 Type 2 diabetes mellitus with diabetic chronic kidney disease: Secondary | ICD-10-CM | POA: Diagnosis present

## 2024-01-30 DIAGNOSIS — E119 Type 2 diabetes mellitus without complications: Secondary | ICD-10-CM | POA: Diagnosis not present

## 2024-01-30 DIAGNOSIS — E1169 Type 2 diabetes mellitus with other specified complication: Secondary | ICD-10-CM | POA: Diagnosis present

## 2024-01-30 DIAGNOSIS — R579 Shock, unspecified: Secondary | ICD-10-CM

## 2024-01-30 DIAGNOSIS — E440 Moderate protein-calorie malnutrition: Secondary | ICD-10-CM | POA: Diagnosis not present

## 2024-01-30 DIAGNOSIS — R9082 White matter disease, unspecified: Secondary | ICD-10-CM | POA: Diagnosis not present

## 2024-01-30 DIAGNOSIS — E854 Organ-limited amyloidosis: Principal | ICD-10-CM | POA: Diagnosis present

## 2024-01-30 DIAGNOSIS — R569 Unspecified convulsions: Secondary | ICD-10-CM | POA: Diagnosis not present

## 2024-01-30 DIAGNOSIS — I5082 Biventricular heart failure: Secondary | ICD-10-CM | POA: Diagnosis present

## 2024-01-30 DIAGNOSIS — N1832 Chronic kidney disease, stage 3b: Secondary | ICD-10-CM | POA: Diagnosis not present

## 2024-01-30 DIAGNOSIS — Z1152 Encounter for screening for COVID-19: Secondary | ICD-10-CM

## 2024-01-30 DIAGNOSIS — J9601 Acute respiratory failure with hypoxia: Secondary | ICD-10-CM | POA: Diagnosis present

## 2024-01-30 DIAGNOSIS — R578 Other shock: Secondary | ICD-10-CM | POA: Diagnosis present

## 2024-01-30 DIAGNOSIS — R001 Bradycardia, unspecified: Secondary | ICD-10-CM | POA: Diagnosis present

## 2024-01-30 DIAGNOSIS — Z6824 Body mass index (BMI) 24.0-24.9, adult: Secondary | ICD-10-CM

## 2024-01-30 DIAGNOSIS — D696 Thrombocytopenia, unspecified: Secondary | ICD-10-CM | POA: Diagnosis not present

## 2024-01-30 DIAGNOSIS — E11649 Type 2 diabetes mellitus with hypoglycemia without coma: Secondary | ICD-10-CM | POA: Diagnosis not present

## 2024-01-30 DIAGNOSIS — R627 Adult failure to thrive: Secondary | ICD-10-CM | POA: Diagnosis not present

## 2024-01-30 DIAGNOSIS — R7881 Bacteremia: Secondary | ICD-10-CM | POA: Diagnosis present

## 2024-01-30 DIAGNOSIS — R000 Tachycardia, unspecified: Secondary | ICD-10-CM | POA: Diagnosis not present

## 2024-01-30 DIAGNOSIS — G309 Alzheimer's disease, unspecified: Secondary | ICD-10-CM | POA: Diagnosis not present

## 2024-01-30 DIAGNOSIS — N184 Chronic kidney disease, stage 4 (severe): Secondary | ICD-10-CM | POA: Diagnosis not present

## 2024-01-30 DIAGNOSIS — N179 Acute kidney failure, unspecified: Secondary | ICD-10-CM | POA: Diagnosis present

## 2024-01-30 DIAGNOSIS — F03918 Unspecified dementia, unspecified severity, with other behavioral disturbance: Secondary | ICD-10-CM | POA: Diagnosis not present

## 2024-01-30 DIAGNOSIS — Z9911 Dependence on respirator [ventilator] status: Secondary | ICD-10-CM

## 2024-01-30 DIAGNOSIS — R0689 Other abnormalities of breathing: Secondary | ICD-10-CM | POA: Diagnosis not present

## 2024-01-30 DIAGNOSIS — I5022 Chronic systolic (congestive) heart failure: Secondary | ICD-10-CM | POA: Diagnosis not present

## 2024-01-30 DIAGNOSIS — R4182 Altered mental status, unspecified: Secondary | ICD-10-CM | POA: Diagnosis not present

## 2024-01-30 DIAGNOSIS — R739 Hyperglycemia, unspecified: Secondary | ICD-10-CM | POA: Diagnosis not present

## 2024-01-30 DIAGNOSIS — I462 Cardiac arrest due to underlying cardiac condition: Secondary | ICD-10-CM | POA: Diagnosis not present

## 2024-01-30 DIAGNOSIS — R402431 Glasgow coma scale score 3-8, in the field [EMT or ambulance]: Principal | ICD-10-CM

## 2024-01-30 DIAGNOSIS — I4510 Unspecified right bundle-branch block: Secondary | ICD-10-CM | POA: Diagnosis present

## 2024-01-30 DIAGNOSIS — Z66 Do not resuscitate: Secondary | ICD-10-CM | POA: Diagnosis present

## 2024-01-30 DIAGNOSIS — I130 Hypertensive heart and chronic kidney disease with heart failure and stage 1 through stage 4 chronic kidney disease, or unspecified chronic kidney disease: Secondary | ICD-10-CM | POA: Diagnosis present

## 2024-01-30 DIAGNOSIS — Z7984 Long term (current) use of oral hypoglycemic drugs: Secondary | ICD-10-CM

## 2024-01-30 DIAGNOSIS — R404 Transient alteration of awareness: Secondary | ICD-10-CM | POA: Diagnosis not present

## 2024-01-30 DIAGNOSIS — I38 Endocarditis, valve unspecified: Secondary | ICD-10-CM | POA: Diagnosis not present

## 2024-01-30 DIAGNOSIS — B957 Other staphylococcus as the cause of diseases classified elsewhere: Secondary | ICD-10-CM | POA: Diagnosis present

## 2024-01-30 DIAGNOSIS — G9340 Encephalopathy, unspecified: Secondary | ICD-10-CM | POA: Diagnosis not present

## 2024-01-30 DIAGNOSIS — F02C0 Dementia in other diseases classified elsewhere, severe, without behavioral disturbance, psychotic disturbance, mood disturbance, and anxiety: Secondary | ICD-10-CM | POA: Diagnosis not present

## 2024-01-30 DIAGNOSIS — I771 Stricture of artery: Secondary | ICD-10-CM | POA: Diagnosis not present

## 2024-01-30 DIAGNOSIS — I469 Cardiac arrest, cause unspecified: Secondary | ICD-10-CM | POA: Diagnosis present

## 2024-01-30 DIAGNOSIS — R918 Other nonspecific abnormal finding of lung field: Secondary | ICD-10-CM | POA: Diagnosis not present

## 2024-01-30 DIAGNOSIS — I43 Cardiomyopathy in diseases classified elsewhere: Secondary | ICD-10-CM | POA: Diagnosis present

## 2024-01-30 DIAGNOSIS — Z7982 Long term (current) use of aspirin: Secondary | ICD-10-CM

## 2024-01-30 DIAGNOSIS — H409 Unspecified glaucoma: Secondary | ICD-10-CM | POA: Diagnosis present

## 2024-01-30 DIAGNOSIS — Z7401 Bed confinement status: Secondary | ICD-10-CM | POA: Diagnosis not present

## 2024-01-30 DIAGNOSIS — Z515 Encounter for palliative care: Secondary | ICD-10-CM | POA: Diagnosis not present

## 2024-01-30 DIAGNOSIS — E162 Hypoglycemia, unspecified: Secondary | ICD-10-CM | POA: Diagnosis not present

## 2024-01-30 DIAGNOSIS — R61 Generalized hyperhidrosis: Secondary | ICD-10-CM | POA: Diagnosis not present

## 2024-01-30 DIAGNOSIS — R911 Solitary pulmonary nodule: Secondary | ICD-10-CM | POA: Diagnosis present

## 2024-01-30 DIAGNOSIS — R0600 Dyspnea, unspecified: Secondary | ICD-10-CM | POA: Diagnosis not present

## 2024-01-30 DIAGNOSIS — Z8249 Family history of ischemic heart disease and other diseases of the circulatory system: Secondary | ICD-10-CM

## 2024-01-30 DIAGNOSIS — Z79899 Other long term (current) drug therapy: Secondary | ICD-10-CM

## 2024-01-30 DIAGNOSIS — G9341 Metabolic encephalopathy: Secondary | ICD-10-CM | POA: Diagnosis present

## 2024-01-30 DIAGNOSIS — J9811 Atelectasis: Secondary | ICD-10-CM | POA: Diagnosis present

## 2024-01-30 DIAGNOSIS — Z87891 Personal history of nicotine dependence: Secondary | ICD-10-CM

## 2024-01-30 DIAGNOSIS — E785 Hyperlipidemia, unspecified: Secondary | ICD-10-CM | POA: Diagnosis present

## 2024-01-30 DIAGNOSIS — I872 Venous insufficiency (chronic) (peripheral): Secondary | ICD-10-CM | POA: Diagnosis present

## 2024-01-30 LAB — URINALYSIS, ROUTINE W REFLEX MICROSCOPIC
Bacteria, UA: NONE SEEN
Bilirubin Urine: NEGATIVE
Glucose, UA: >=500 mg/dL — AB
Hgb urine dipstick: NEGATIVE
Ketones, ur: NEGATIVE mg/dL
Leukocytes,Ua: NEGATIVE
Nitrite: NEGATIVE
Protein, ur: NEGATIVE mg/dL
Specific Gravity, Urine: 1.007 (ref 1.005–1.030)
pH: 6 (ref 5.0–8.0)

## 2024-01-30 LAB — COMPREHENSIVE METABOLIC PANEL
ALT: <5 U/L (ref 0–44)
AST: 15 U/L (ref 15–41)
Albumin: 2.4 g/dL — ABNORMAL LOW (ref 3.5–5.0)
Alkaline Phosphatase: 48 U/L (ref 38–126)
Anion gap: 9 (ref 5–15)
BUN: 20 mg/dL (ref 8–23)
CO2: 22 mmol/L (ref 22–32)
Calcium: 6.8 mg/dL — ABNORMAL LOW (ref 8.9–10.3)
Chloride: 102 mmol/L (ref 98–111)
Creatinine, Ser: 2.12 mg/dL — ABNORMAL HIGH (ref 0.61–1.24)
GFR, Estimated: 30 mL/min — ABNORMAL LOW (ref >60)
Glucose, Bld: 464 mg/dL — ABNORMAL HIGH (ref 70–99)
Potassium: 3.5 mmol/L (ref 3.5–5.1)
Sodium: 133 mmol/L — ABNORMAL LOW (ref 135–145)
Total Bilirubin: 0.6 mg/dL (ref 0.0–1.2)
Total Protein: 4.9 g/dL — ABNORMAL LOW (ref 6.5–8.1)

## 2024-01-30 LAB — GLUCOSE, CAPILLARY
Glucose-Capillary: 167 mg/dL — ABNORMAL HIGH (ref 70–99)
Glucose-Capillary: 51 mg/dL — ABNORMAL LOW (ref 70–99)
Glucose-Capillary: 53 mg/dL — ABNORMAL LOW (ref 70–99)
Glucose-Capillary: 67 mg/dL — ABNORMAL LOW (ref 70–99)
Glucose-Capillary: 82 mg/dL (ref 70–99)
Glucose-Capillary: 87 mg/dL (ref 70–99)

## 2024-01-30 LAB — CBC WITH DIFFERENTIAL/PLATELET
Abs Immature Granulocytes: 0.02 10*3/uL (ref 0.00–0.07)
Basophils Absolute: 0 10*3/uL (ref 0.0–0.1)
Basophils Relative: 1 %
Eosinophils Absolute: 0 10*3/uL (ref 0.0–0.5)
Eosinophils Relative: 1 %
HCT: 54.1 % — ABNORMAL HIGH (ref 39.0–52.0)
Hemoglobin: 17 g/dL (ref 13.0–17.0)
Immature Granulocytes: 1 %
Lymphocytes Relative: 10 %
Lymphs Abs: 0.4 10*3/uL — ABNORMAL LOW (ref 0.7–4.0)
MCH: 29.1 pg (ref 26.0–34.0)
MCHC: 31.4 g/dL (ref 30.0–36.0)
MCV: 92.6 fL (ref 80.0–100.0)
Monocytes Absolute: 0.5 10*3/uL (ref 0.1–1.0)
Monocytes Relative: 13 %
Neutro Abs: 2.7 10*3/uL (ref 1.7–7.7)
Neutrophils Relative %: 74 %
Platelets: 140 10*3/uL — ABNORMAL LOW (ref 150–400)
RBC: 5.84 MIL/uL — ABNORMAL HIGH (ref 4.22–5.81)
RDW: 16.6 % — ABNORMAL HIGH (ref 11.5–15.5)
WBC: 3.7 10*3/uL — ABNORMAL LOW (ref 4.0–10.5)
nRBC: 0 % (ref 0.0–0.2)

## 2024-01-30 LAB — I-STAT CG4 LACTIC ACID, ED
Lactic Acid, Venous: 4.2 mmol/L (ref 0.5–1.9)
Lactic Acid, Venous: 3.2 mmol/L (ref 0.5–1.9)

## 2024-01-30 LAB — I-STAT ARTERIAL BLOOD GAS, ED
Acid-Base Excess: 2 mmol/L (ref 0.0–2.0)
Bicarbonate: 22.2 mmol/L (ref 20.0–28.0)
Calcium, Ion: 1.05 mmol/L — ABNORMAL LOW (ref 1.15–1.40)
HCT: 56 % — ABNORMAL HIGH (ref 39.0–52.0)
Hemoglobin: 19 g/dL — ABNORMAL HIGH (ref 13.0–17.0)
O2 Saturation: 100 %
Patient temperature: 97.1
Potassium: 3.9 mmol/L (ref 3.5–5.1)
Sodium: 144 mmol/L (ref 135–145)
TCO2: 23 mmol/L (ref 22–32)
pCO2 arterial: 24.4 mmHg — ABNORMAL LOW (ref 32–48)
pH, Arterial: 7.564 — ABNORMAL HIGH (ref 7.35–7.45)
pO2, Arterial: 184 mmHg — ABNORMAL HIGH (ref 83–108)

## 2024-01-30 LAB — RAPID URINE DRUG SCREEN, HOSP PERFORMED
Amphetamines: NOT DETECTED
Barbiturates: NOT DETECTED
Benzodiazepines: NOT DETECTED
Cocaine: NOT DETECTED
Opiates: NOT DETECTED
Tetrahydrocannabinol: NOT DETECTED

## 2024-01-30 LAB — MAGNESIUM: Magnesium: 1.9 mg/dL (ref 1.7–2.4)

## 2024-01-30 LAB — I-STAT CHEM 8, ED
BUN: 32 mg/dL — ABNORMAL HIGH (ref 8–23)
Calcium, Ion: 0.91 mmol/L — ABNORMAL LOW (ref 1.15–1.40)
Chloride: 102 mmol/L (ref 98–111)
Creatinine, Ser: 2.5 mg/dL — ABNORMAL HIGH (ref 0.61–1.24)
Glucose, Bld: 270 mg/dL — ABNORMAL HIGH (ref 70–99)
HCT: 53 % — ABNORMAL HIGH (ref 39.0–52.0)
Hemoglobin: 18 g/dL — ABNORMAL HIGH (ref 13.0–17.0)
Potassium: 4.3 mmol/L (ref 3.5–5.1)
Sodium: 139 mmol/L (ref 135–145)
TCO2: 27 mmol/L (ref 22–32)

## 2024-01-30 LAB — RESP PANEL BY RT-PCR (RSV, FLU A&B, COVID)  RVPGX2
Influenza A by PCR: NEGATIVE
Influenza B by PCR: NEGATIVE
Resp Syncytial Virus by PCR: NEGATIVE
SARS Coronavirus 2 by RT PCR: NEGATIVE

## 2024-01-30 LAB — AMMONIA: Ammonia: 66 umol/L — ABNORMAL HIGH (ref 9–35)

## 2024-01-30 LAB — TROPONIN I (HIGH SENSITIVITY)
Troponin I (High Sensitivity): 128 ng/L (ref <18)
Troponin I (High Sensitivity): 137 ng/L (ref <18)

## 2024-01-30 LAB — MRSA NEXT GEN BY PCR, NASAL: MRSA by PCR Next Gen: NOT DETECTED

## 2024-01-30 LAB — TSH: TSH: 1.116 u[IU]/mL (ref 0.350–4.500)

## 2024-01-30 MED ORDER — ORAL CARE MOUTH RINSE: 15.0000 mL | OROMUCOSAL | Status: DC | PRN

## 2024-01-30 MED ORDER — DOCUSATE SODIUM 50 MG/5ML PO LIQD: 100.0000 mg | Freq: Two times a day (BID) | ORAL | Status: DC | PRN

## 2024-01-30 MED ORDER — ROCURONIUM BROMIDE 10 MG/ML (PF) SYRINGE
PREFILLED_SYRINGE | INTRAVENOUS | Status: AC | PRN
  Administered 2024-01-30: 100 mg via INTRAVENOUS

## 2024-01-30 MED ORDER — HEPARIN SODIUM (PORCINE) 5000 UNIT/ML IJ SOLN
5000.0000 [IU] | Freq: Three times a day (TID) | INTRAMUSCULAR | Status: AC
  Administered 2024-01-30 – 2024-02-04 (×17): 5000 [IU] via SUBCUTANEOUS
  Filled 2024-01-30 (×17): qty 1

## 2024-01-30 MED ORDER — ETOMIDATE 2 MG/ML IV SOLN
INTRAVENOUS | Status: AC | PRN
  Administered 2024-01-30: 20 mg via INTRAVENOUS

## 2024-01-30 MED ORDER — SODIUM CHLORIDE 0.9 % IV SOLN
3.0000 g | Freq: Two times a day (BID) | INTRAVENOUS | Status: DC
  Administered 2024-01-30 – 2024-01-31 (×2): 3 g via INTRAVENOUS
  Filled 2024-01-30 (×2): qty 8

## 2024-01-30 MED ORDER — DEXTROSE 50 % IV SOLN
12.5000 g | INTRAVENOUS | Status: AC
  Administered 2024-01-31: 12.5 g via INTRAVENOUS

## 2024-01-30 MED ORDER — FENTANYL 2500MCG IN NS 250ML (10MCG/ML) PREMIX INFUSION
25.0000 ug/h | INTRAVENOUS | Status: DC
  Administered 2024-01-30: 50 ug/h via INTRAVENOUS
  Filled 2024-01-30: qty 250

## 2024-01-30 MED ORDER — ORAL CARE MOUTH RINSE
15.0000 mL | OROMUCOSAL | Status: DC
  Administered 2024-01-30 – 2024-02-01 (×15): 15 mL via OROMUCOSAL

## 2024-01-30 MED ORDER — NOREPINEPHRINE 4 MG/250ML-% IV SOLN
0.0000 ug/min | INTRAVENOUS | Status: DC
  Administered 2024-01-30: 5 ug/min via INTRAVENOUS

## 2024-01-30 MED ORDER — DEXTROSE 50 % IV SOLN
25.0000 g | INTRAVENOUS | Status: AC
  Administered 2024-01-30: 25 g via INTRAVENOUS
  Filled 2024-01-30: qty 50

## 2024-01-30 MED ORDER — PROPOFOL 1000 MG/100ML IV EMUL
0.0000 ug/kg/min | INTRAVENOUS | Status: DC
  Administered 2024-01-30: 20 ug/kg/min via INTRAVENOUS
  Administered 2024-01-30: 5 ug/kg/min via INTRAVENOUS
  Filled 2024-01-30 (×2): qty 100

## 2024-01-30 MED ORDER — POLYETHYLENE GLYCOL 3350 17 G PO PACK: 17.0000 g | PACK | Freq: Every day | ORAL | Status: DC | PRN

## 2024-01-30 MED ORDER — INSULIN ASPART 100 UNIT/ML IJ SOLN: 0.0000 [IU] | INTRAMUSCULAR | Status: DC

## 2024-01-30 MED ORDER — CHLORHEXIDINE GLUCONATE CLOTH 2 % EX PADS
6.0000 | MEDICATED_PAD | Freq: Every day | CUTANEOUS | Status: DC
  Administered 2024-01-30 – 2024-02-05 (×7): 6 via TOPICAL

## 2024-01-30 MED ORDER — SODIUM CHLORIDE 0.9 % IV SOLN
250.0000 mL | INTRAVENOUS | Status: AC
  Administered 2024-01-30: 250 mL via INTRAVENOUS

## 2024-01-30 MED ORDER — FAMOTIDINE 20 MG PO TABS
20.0000 mg | ORAL_TABLET | Freq: Every day | ORAL | Status: DC
  Administered 2024-01-31: 20 mg
  Filled 2024-01-30: qty 1

## 2024-01-30 MED ORDER — FENTANYL BOLUS VIA INFUSION: 25.0000 ug | INTRAVENOUS | Status: DC | PRN

## 2024-01-30 MED ORDER — PANTOPRAZOLE SODIUM 40 MG PO TBEC: 40.0000 mg | DELAYED_RELEASE_TABLET | Freq: Every day | ORAL | Status: DC

## 2024-01-30 MED ORDER — NOREPINEPHRINE 4 MG/250ML-% IV SOLN
2.0000 ug/min | INTRAVENOUS | Status: DC
  Administered 2024-01-31: 2 ug/min via INTRAVENOUS
  Administered 2024-01-31: 4 ug/min via INTRAVENOUS
  Filled 2024-01-30: qty 250

## 2024-01-30 NOTE — Progress Notes (Signed)
 Pharmacy Antibiotic Note  Steven Myers is a 89 y.o. male admitted on 01/30/2024 with  aspiration event .  Pharmacy has been consulted for Unasyn dosing. CrCl calculated 60mL/min based on historical weight of approximately 80kg.   Plan: Unasyn 3g q12h.  Follow culture data for de-escalation.  Monitor renal function for dose adjustments as indicated.   Height: 6' (182.9 cm) IBW/kg (Calculated) : 77.6  Temp (24hrs), Avg:97.4 F (36.3 C), Min:97 F (36.1 C), Max:97.6 F (36.4 C)  Recent Labs  Lab 01/30/24 1419 01/30/24 1429 01/30/24 1430 01/30/24 1535  WBC 3.7*  --   --   --   CREATININE 2.12* 2.50*  --   --   LATICACIDVEN  --   --  4.2* 3.2*    CrCl cannot be calculated (Unknown ideal weight.).    No Known Allergies  Antimicrobials this admission: Unasyn 3/9 >>   Microbiology results: 3/9 BCx:   Thank you for allowing pharmacy to be a part of this patient's care.  Estill Batten, PharmD, BCCCP  01/30/2024 4:36 PM

## 2024-01-30 NOTE — Progress Notes (Signed)
Pt was transported to CT and back via ventilator with no apparent complications.

## 2024-01-30 NOTE — Progress Notes (Signed)
 EEG complete - results pending

## 2024-01-30 NOTE — ED Notes (Signed)
 Md notified of Troponin 128.

## 2024-01-30 NOTE — Procedures (Signed)
 Patient Name: Kamau Weatherall  MRN: [id number]  Epilepsy Attending: Charlsie Quest  Referring Physician/Provider: Luciano Cutter, MD  Date: 01/30/2024 Duration: 22.08 mins  Patient history: 89yo M s/p cardiac arrest. EEG to evaluate for seizure  Level of alertness:comatose  AEDs during EEG study: propofol  Technical aspects: This EEG study was done with scalp electrodes positioned according to the 10-20 International system of electrode placement. Electrical activity was reviewed with band pass filter of 1-70Hz , sensitivity of 7 uV/mm, display speed of 24mm/sec with a 60Hz  notched filter applied as appropriate. EEG data were recorded continuously and digitally stored.  Video monitoring was available and reviewed as appropriate.  Description: EEG showed continuous generalized 3 to 6 Hz theta-delta slowing admixed with 13-15hz  beta activity. Photic driving was not seen during photic stimulation. Hyperventilation was not performed.     ABNORMALITY - Continuous slow, generalized  IMPRESSION: This study is suggestive of severe diffuse encephalopathy. No seizures or epileptiform discharges were seen throughout the recording.  Onur Mori Annabelle Harman

## 2024-01-30 NOTE — Progress Notes (Signed)
 Pt was transported to 2H via ventilator with no apparent complications. 2H RT notified.

## 2024-01-30 NOTE — H&P (Signed)
 NAME:  Steven Myers, MRN:  [id number], DOB:  1935-02-13, LOS: 0 ADMISSION DATE:  01/30/2024, CONSULTATION DATE:  01/30/24 REFERRING MD:  Elayne Snare, DO CHIEF COMPLAINT:  Unresponsive   History of Present Illness:  89 year old male SNF resident with cardiac amyloidosis, HTN, DM2, CKD IV who presents after found unresponsive. LKN 10 AM when receiving his AM meds. EMS found patient bradycardic and hypotensive > started on epi gtt. Patient reportedly in asystole and received 10 compressions without meds with ROSC. Of note, patient is DNR but family reversed for full code. In the ED, patient intubated and started on levophed. CT head NAICA. CXR ETT in place with atelectasis at bases, no effusion. Labs significant for BUN/Cr 32/2.5 (c/w known CKD), LA 4.2>3.2. WBC 3.7, Hg 17 Plt 140. UA neg. Trop 128  Pertinent  Medical History  cardiac amyloidosis, HTN, DM2, CKD IV  Significant Hospital Events: Including procedures, antibiotic start and stop dates in addition to other pertinent events     Interim History / Subjective:  As above  Objective   Blood pressure (!) 155/112, pulse 77, temperature (!) 97.5 F (36.4 C), resp. rate 14, height 6' (1.829 m), SpO2 100%.    Vent Mode: PRVC FiO2 (%):  [60 %] 60 % Set Rate:  [14 bmp-22 bmp] 14 bmp Vt Set:  [560 mL-620 mL] 620 mL PEEP:  [5 cmH20] 5 cmH20 Plateau Pressure:  [17 cmH20] 17 cmH20   Intake/Output Summary (Last 24 hours) at 01/30/2024 1604 Last data filed at 01/30/2024 1527 Gross per 24 hour  Intake 15.45 ml  Output --  Net 15.45 ml   There were no vitals filed for this visit.  Physical Exam: General: Well-appearing, no acute distress HENT: [place], AT, OP clear, MMM Eyes: Pupils 2 mm and nonreactive, no scleral icterus Respiratory: Coarse breath sounds auscultation bilaterally.  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 Weaned off in ED. May be related to low cardiac output +/- sepsis -Unasyn -F/u cultures -Trend LA  DM2 -Hold home meds -CBG, SSI  CKD IV -Monitor UOP/Cr -Maintain renal perfusion  -Avoid nephrotoxic agents  Dementia -Hold Zyprexa  Chronic thrombocytopenia -Trend  GOC -Patient previously DNR with known end stage cardiac amyloid. Last hospitalization seen by palliative and anticipated that need for hospice in the future given his poor prognosis. Very likely cardiac event led to this hospitalization. DNR was reversed by family and patient now full code on ventilator. -Discussed with family in the ED his overall negative work-up with no reversible causes identified. Patient's wife on the phone, and son and daughter at bedside expressed understanding that if the main cause is due to this heart then they wish to to return to DNR status. Expectant management at this point would be to  see if patient will neurologically recover in the next 24-48 hours for possible extubation and if not, they would be ok with transitioning to comfort care.   Best Practice (right click and "Reselect all SmartList Selections" daily)   Diet/type: NPO DVT prophylaxis prophylactic  heparin  Pressure ulcer(s): N/A GI prophylaxis: PPI Lines: N/A Foley:  Yes, and it is still needed Code Status:  full code Last date of multidisciplinary goals of care discussion [ 3/9] DNR  Labs   CBC: Recent Labs  Lab 01/30/24 1419 01/30/24 1429 01/30/24 1453  WBC 3.7*  --   --   NEUTROABS 2.7  --   --   HGB 17.0 18.0* 19.0*  HCT 54.1* 53.0* 56.0*  MCV 92.6  --   --   PLT 140*  --   --     Basic Metabolic Panel: Recent Labs  Lab 01/30/24 1419 01/30/24 1429 01/30/24 1453  NA 133* 139 144  K 3.5 4.3 3.9  CL 102 102  --   CO2 22  --   --   GLUCOSE 464* 270*  --   BUN 20 32*  --   CREATININE 2.12* 2.50*  --   CALCIUM 6.8*  --   --   MG 1.9  --   --    GFR: CrCl cannot be calculated (Unknown ideal weight.). Recent Labs  Lab 01/30/24 1419 01/30/24 1430 01/30/24 1535  WBC 3.7*  --   --   LATICACIDVEN  --  4.2* 3.2*    Liver Function Tests: Recent Labs  Lab 01/30/24 1419  AST 15  ALT <5  ALKPHOS 48  BILITOT 0.6  PROT 4.9*  ALBUMIN 2.4*   No results for input(s): "LIPASE", "AMYLASE" in the last 168 hours. No results for input(s): "AMMONIA" in the last 168 hours.  ABG    Component Value Date/Time   PHART 7.564 (H) 01/30/2024 1453   PCO2ART 24.4 (L) 01/30/2024 1453   PO2ART 184 (H) 01/30/2024 1453   HCO3 22.2 01/30/2024 1453   TCO2 23 01/30/2024 1453   O2SAT 100 01/30/2024 1453     Coagulation Profile: No results for input(s): "INR", "PROTIME" in the last 168 hours.  Cardiac Enzymes: No results for input(s): "CKTOTAL", "CKMB", "CKMBINDEX", "TROPONINI" in the last 168 hours.  HbA1C: Hgb A1c MFr Bld  Date/Time Value Ref Range Status  10/08/2023 11:30 AM 6.5 (H) 4.8 - 5.6 % Final    Comment:    (NOTE) Pre diabetes:          5.7%-6.4%  Diabetes:              >6.4%  Glycemic control for   <7.0% adults with diabetes   06/11/2022 05:00 PM 6.6 (H) 4.8 - 5.6 % Final    Comment:    (NOTE) Pre diabetes:          5.7%-6.4%  Diabetes:               >6.4%  Glycemic control for   <7.0% adults with diabetes     CBG: No results for input(s): "GLUCAP" in the last 168 hours.  Review of Systems:   Unable to obtain due to critical illness  Past Medical History:  He,  has a past medical history of Bilateral cataracts, Cardiac amyloidosis (HCC) (07/29/2023), Cardiomyopathy (HCC), Chest pain, CKD (chronic kidney disease) stage 3, GFR 30-59 ml/min (HCC), DM (diabetes mellitus) type II controlled with renal manifestation (HCC), Essential hypertension, Glaucoma, History of colonoscopy, Hyperlipidemia associated with type  2 diabetes mellitus (HCC), Obesity due to excess calories without serious comorbidity, Osteoarthritis of hip, RBBB (right bundle branch block), and Venous stasis dermatitis of both lower extremities - with edema.   Surgical History:   Past Surgical History:  Procedure Laterality Date   NM MYOVIEW LTD  04/2010   LEXISCAN: EF 62%. NO ISCHEMIA OR INFARCTION. LOW RISK. No RWMA      Social History:   reports that he quit smoking about 45 years ago. His smoking use included cigarettes. He has never used smokeless tobacco. He reports that he does not currently use alcohol. He reports that he does not use drugs.   Family History:  His family history includes Diabetes in his mother; Heart disease (age of onset: 65) in his father. There is no history of Colon cancer.   Allergies No Known Allergies   Home Medications  Prior to Admission medications   Medication Sig Start Date End Date Taking? Authorizing Provider  artificial tears (LACRILUBE) OINT ophthalmic ointment Place 1 Application into both eyes See admin instructions. Place as directed into both eyes as directed at bedtime- 5 minutes after Travoprost    [provider]  Artificial Tears ophthalmic solution Place 1 drop into both eyes 3 (three) times daily.    [provider]  aspirin 81 MG tablet Take 81 mg by mouth daily.      [provider]  brimonidine-timolol (COMBIGAN) 0.2-0.5 % ophthalmic solution Place 1 drop into both eyes in the morning and at bedtime.    [provider]  cyanocobalamin 1000 MCG tablet Take 1 tablet (1,000 mcg total) by mouth daily. 11/27/23   Zannie Cove, MD  empagliflozin (JARDIANCE) 10 MG TABS tablet Take 1 tablet (10 mg total) by mouth daily before breakfast. 08/11/23   [name] T, PA-C  ezetimibe (ZETIA) 10 MG tablet Take 10 mg by mouth daily. 03/21/18   [provider]  latanoprost (XALATAN) 0.005 % ophthalmic solution Place 1 drop into both eyes at bedtime. Hasn't started using yet, finishing up the Travatan Z first. 11/09/23   [provider]  metoprolol succinate (TOPROL-XL) 25 MG 24 hr tablet Take 1 tablet (25 mg total) by mouth daily. Take with or immediately following a meal. 11/26/23   Zannie Cove, MD  OLANZapine [facility]) 5 MG tablet Every evening at 8 PM 11/26/23   Zannie Cove, MD  potassium chloride SA (KLOR-CON M) 20 MEQ tablet Take 1 tablet (20 mEq total) by mouth daily. 11/26/23   Zannie Cove, MD  RHOPRESSA 0.02 % SOLN Place 1 drop into both eyes at bedtime. 02/02/23   [provider]  Tafamidis Meglumine, Cardiac, (VYNDAQEL) 20 MG CAPS Take 4 capsules (80 mg total) by mouth daily. 10/18/23   Sabharwal, Aditya, DO  torsemide (DEMADEX) 20 MG tablet Take 40 mg in a.m. and 20 mg in afternoon 11/26/23   Zannie Cove, MD  Travoprost, BAK Free, (TRAVATAN Z) 0.004 % SOLN ophthalmic solution Place 1 drop into both eyes at bedtime.    [provider]     Critical care time: 65 min    The patient is critically ill with multiple organ systems failure and requires high complexity decision making for assessment and support, frequent evaluation and titration of therapies, application of advanced monitoring technologies and extensive interpretation of multiple databases.  Independent Critical Care Time: 65 Minutes.   Mechele Collin, M.D. [facility]  Pulmonary/Critical Care Medicine 01/30/2024 4:05 PM   Please see Amion for pager number to  reach on-call Pulmonary and Critical Care Team.

## 2024-01-30 NOTE — ED Provider Notes (Signed)
 [place] EMERGENCY DEPARTMENT AT [facility] Provider Note   CSN: [id number] Arrival date & time: 01/30/24  1334     History  Chief Complaint  Patient presents with   Altered Mental Status    Steven Myers is a 89 y.o. male.  Patient is a 89 year old male with a past medical history of CHF with EF of less than 25%, hypertension, cardiac amyloidosis, diabetes, possible dementia, CKD stage IV presenting to the emergency department with altered mental status.  Per EMS the patient is currently at a SNF, and was acting like his normal self when he got up this morning.  He was last seen well around 10 AM when he got his morning medications.  EMS reports that he was then found by staff unresponsive with agonal respirations.  EMS states that he was initially bradycardic to the 40s to 60s on their arrival and hypotensive to the 60s.  They started an epi drip.  They state that he did briefly become apneic and appeared to go into asystole.  They state that they did 10 compressions before he started to move and was back into a sinus rhythm with palpable pulses.  They deny any report of trauma.  They report he is ANO x 4 at baseline.  They report that he did have DNR paperwork however family was at the nursing home facility and discussed with multiple family members who all reported that they would like for him to be full code.  The history is provided by the EMS personnel.       Home Medications Prior to Admission medications   Medication Sig Start Date End Date Taking? Authorizing Provider  artificial tears (LACRILUBE) OINT ophthalmic ointment Place 1 Application into both eyes See admin instructions. Place as directed into both eyes as directed at bedtime- 5 minutes after Travoprost    [provider]  Artificial Tears ophthalmic solution Place 1 drop into both eyes 3 (three) times daily.    [provider]  aspirin 81 MG tablet Take 81 mg by mouth daily.       [provider]  brimonidine-timolol (COMBIGAN) 0.2-0.5 % ophthalmic solution Place 1 drop into both eyes in the morning and at bedtime.    [provider]  cyanocobalamin 1000 MCG tablet Take 1 tablet (1,000 mcg total) by mouth daily. 11/27/23   Zannie Cove, MD  empagliflozin (JARDIANCE) 10 MG TABS tablet Take 1 tablet (10 mg total) by mouth daily before breakfast. 08/11/23   [name] T, PA-C  ezetimibe (ZETIA) 10 MG tablet Take 10 mg by mouth daily. 03/21/18   [provider]  latanoprost (XALATAN) 0.005 % ophthalmic solution Place 1 drop into both eyes at bedtime. Hasn't started using yet, finishing up the Travatan Z first. 11/09/23   [provider]  metoprolol succinate (TOPROL-XL) 25 MG 24 hr tablet Take 1 tablet (25 mg total) by mouth daily. Take with or immediately following a meal. 11/26/23   Zannie Cove, MD  OLANZapine [facility]) 5 MG tablet Every evening at 8 PM 11/26/23   Zannie Cove, MD  potassium chloride SA (KLOR-CON M) 20 MEQ tablet Take 1 tablet (20 mEq total) by mouth daily. 11/26/23   Zannie Cove, MD  RHOPRESSA 0.02 % SOLN Place 1 drop into both eyes at bedtime. 02/02/23   [provider]  Tafamidis Meglumine, Cardiac, (VYNDAQEL) 20 MG CAPS Take 4 capsules (80 mg total) by mouth daily. 10/18/23   Sabharwal, Aditya, DO  torsemide (DEMADEX)  20 MG tablet Take 40 mg in a.m. and 20 mg in afternoon 11/26/23   Zannie Cove, MD  Travoprost, BAK Free, (TRAVATAN Z) 0.004 % SOLN ophthalmic solution Place 1 drop into both eyes at bedtime.    [provider]      Allergies    Patient has no known allergies.    Review of Systems   Review of Systems  Physical Exam Updated Vital Signs BP 107/82   Pulse 84   Temp (!) 97.1 F (36.2 C)   Resp (!) 22   Ht 6' (1.829 m)   SpO2 100%   BMI 24.76 kg/m  Physical Exam Vitals and nursing note reviewed.  Constitutional:      General: He is in acute distress.     Appearance: He  is ill-appearing.     Comments: Lethargic, moans and withdrawals to painful stimuli  HENT:     Head: Normocephalic and atraumatic.     Nose: Nose normal.     Mouth/Throat:     Mouth: Mucous membranes are dry.     Pharynx: Oropharynx is clear.  Eyes:     Conjunctiva/sclera: Conjunctivae normal.     Pupils: Pupils are equal, round, and reactive to light.  Cardiovascular:     Rate and Rhythm: Normal rate and regular rhythm.     Heart sounds: Normal heart sounds.  Pulmonary:     Effort: Pulmonary effort is normal.     Breath sounds: Normal breath sounds.  Abdominal:     General: Abdomen is flat.     Palpations: Abdomen is soft.     Tenderness: There is no abdominal tenderness.  Musculoskeletal:        General: Normal range of motion.     Cervical back: Normal range of motion.  Skin:    General: Skin is dry.     Comments: Extremities cool to touch  Neurological:     Comments: Oriented x 0, moans to pain only. Withdrawals in all 4 extremities, does not open eyes     ED Results / Procedures / Treatments   Labs (all labs ordered are listed, but only abnormal results are displayed) Labs Reviewed  CBC WITH DIFFERENTIAL/PLATELET - Abnormal; Notable for the following components:      Result Value   WBC 3.7 (*)    RBC 5.84 (*)    HCT 54.1 (*)    RDW 16.6 (*)    Platelets 140 (*)    Lymphs Abs 0.4 (*)    All other components within normal limits  I-STAT CG4 LACTIC ACID, ED - Abnormal; Notable for the following components:   Lactic Acid, Venous 4.2 (*)    All other components within normal limits  I-STAT CHEM 8, ED - Abnormal; Notable for the following components:   BUN 32 (*)    Creatinine, Ser 2.50 (*)    Glucose, Bld 270 (*)    Calcium, Ion 0.91 (*)    Hemoglobin 18.0 (*)    HCT 53.0 (*)    All other components within normal limits  I-STAT ARTERIAL BLOOD GAS, ED - Abnormal; Notable for the following components:   pH, Arterial 7.564 (*)    pCO2 arterial 24.4 (*)    pO2,  Arterial 184 (*)    Calcium, Ion 1.05 (*)    HCT 56.0 (*)    Hemoglobin 19.0 (*)    All other components within normal limits  CULTURE, BLOOD (ROUTINE X 2)  CULTURE, BLOOD (ROUTINE X 2)  RESP PANEL BY RT-PCR (RSV, FLU A&B, COVID)  RVPGX2  COMPREHENSIVE METABOLIC PANEL  MAGNESIUM  RAPID URINE DRUG SCREEN, HOSP PERFORMED  URINALYSIS, ROUTINE W REFLEX MICROSCOPIC  TROPONIN I (HIGH SENSITIVITY)    EKG EKG Interpretation Date/Time:  Sunday January 30 2024 14:42:09 EDT Ventricular Rate:  54 PR Interval:  183 QRS Duration:  149 QT Interval:  503 QTC Calculation: 477 R Axis:   270  Text Interpretation: Sinus rhythm Ventricular premature complex Right bundle branch block Inferior infarct, old Probable anterolateral infarct, age indeterm Since last tracing of earlier today No significant change was found Confirmed by Elayne Snare ([id number]) on 01/30/2024 2:48:32 PM  Radiology CT Head Wo Contrast Result Date: 01/30/2024 CLINICAL DATA:  Mental status changes. EXAM: CT HEAD WITHOUT CONTRAST TECHNIQUE: Contiguous axial images were obtained from the base of the skull through the vertex without intravenous contrast. RADIATION DOSE REDUCTION: This exam was performed according to the departmental dose-optimization program which includes automated exposure control, adjustment of the mA and/or kV according to patient size and/or use of iterative reconstruction technique. COMPARISON:  11/21/2023 FINDINGS: Brain: There is no evidence for acute hemorrhage, hydrocephalus, mass lesion, or abnormal extra-axial fluid collection. No definite CT evidence for acute infarction. Diffuse loss of parenchymal volume is consistent with atrophy. Patchy low attenuation in the deep hemispheric and periventricular white matter is nonspecific, but likely reflects chronic microvascular ischemic demyelination. Vascular: No hyperdense vessel or unexpected calcification. Skull: No evidence for fracture. No worrisome lytic or sclerotic  lesion. Sinuses/Orbits: The visualized paranasal sinuses and mastoid air cells are clear. Visualized portions of the globes and intraorbital fat are unremarkable. Other: None. IMPRESSION: 1. No acute intracranial abnormality. 2. Atrophy with chronic small vessel white matter ischemic disease. Electronically Signed   By: Kennith Center M.D.   On: 01/30/2024 14:56   DG Abd Portable 1 View Result Date: 01/30/2024 CLINICAL DATA:  Tube placement EXAM: PORTABLE ABDOMEN - 1 VIEW.  Limited for tube placement COMPARISON:  CT 11/21/2023. FINDINGS: Limited x-ray demonstrates enteric tube with tip overlying the midbody of the stomach. Gas is seen in nondilated loops of bowel elsewhere with large amount of diffuse colonic stool. Catheter overlying the pelvis. The diaphragm is clipped off the edge of the film. Degenerative changes seen the spine pelvis. There is some curvature of the spine as well. Overlapping cardiac leads. IMPRESSION: Enteric tube with tip overlying the midbody of the stomach. Nonspecific bowel gas pattern with significant colonic stool. Limited x-ray Electronically Signed   By: Karen Kays M.D.   On: 01/30/2024 14:30   DG Chest Portable 1 View Result Date: 01/30/2024 CLINICAL DATA:  Intubated EXAM: PORTABLE CHEST 1 VIEW COMPARISON:  X-ray 11/23/2023. FINDINGS: New ET tube with tip seen proximally 2 cm above the carina. Enteric tube with tip extending beneath the diaphragm. Overlapping cardiac leads. Underinflation. Mild opacity lung bases. Atelectasis versus infiltrate. Recommend follow-up. No pneumothorax, edema. Normal cardiopericardial silhouette when adjusting for level of inflation. Calcified tortuous ectatic aorta. Degenerative changes of the spine. IMPRESSION: ET tube and enteric tube in place. Underinflation with basilar opacities. Tortuous ectatic aorta. Recommend follow-up. Electronically Signed   By: Karen Kays M.D.   On: 01/30/2024 14:28    Procedures .Critical Care  Performed by:  Rexford Maus, DO Authorized by: Rexford Maus, DO   Critical care provider statement:    Critical care time (minutes):  30   Critical care was necessary to treat or prevent imminent or life-threatening deterioration of the following  conditions:  Respiratory failure   Critical care was time spent personally by me on the following activities:  Development of treatment plan with patient or surrogate, discussions with consultants, evaluation of patient's response to treatment, examination of patient, ordering and review of laboratory studies, ordering and review of radiographic studies, ordering and performing treatments and interventions, pulse oximetry, re-evaluation of patient's condition and review of old charts Procedure Name: Intubation Date/Time: 01/30/2024 2:35 PM  Performed by: Rexford Maus, DOPre-anesthesia Checklist: Patient identified, Patient being monitored, Emergency Drugs available, Timeout performed and Suction available Oxygen Delivery Method: Non-rebreather mask Preoxygenation: Pre-oxygenation with 100% oxygen Induction Type: Rapid sequence Ventilation: Mask ventilation without difficulty Laryngoscope Size: Glidescope and 4 Grade View: Grade I Tube type: Non-subglottic suction tube Tube size: 7.5 mm Number of attempts: 1 Airway Equipment and Method: Rigid stylet and Video-laryngoscopy Placement Confirmation: ETT inserted through vocal cords under direct vision, CO2 detector and Breath sounds checked- equal and bilateral Secured at: 24 cm Tube secured with: ETT holder Dental Injury: Teeth and Oropharynx as per pre-operative assessment         Medications Ordered in ED Medications  norepinephrine (LEVOPHED) 4mg  in (0.016 mg/mL) premix infusion (6 mcg/min Intravenous Rate/Dose Change 01/30/24 1411)  propofol (DIPRIVAN) 1000 MG/100ML infusion (5 mcg/kg/min  80 kg (Order-Specific) Intravenous New Bag/Given 01/30/24 1455)  fentaNYL in NS  (33mcg/ml) infusion-PREMIX (50 mcg/hr Intravenous New Bag/Given 01/30/24 1423)  fentaNYL (SUBLIMAZE) bolus via infusion 25-100 mcg (has no administration in time range)  etomidate (AMIDATE) injection (20 mg Intravenous Given 01/30/24 1356)  rocuronium (ZEMURON) injection (100 mg Intravenous Given 01/30/24 1357)    ED Course/ Medical Decision Making/ A&P Clinical Course as of 01/30/24 1516  Sun Jan 30, 2024  1511 I spoke with critical care who will evaluate the patient. [VK]    Clinical Course User Index [VK] Rexford Maus, DO                                 Medical Decision Making This patient presents to the ED with chief complaint(s) of unresponsiveness with pertinent past medical history of advanced CHF, HTN, DM which further complicates the presenting complaint. The complaint involves an extensive differential diagnosis and also carries with it a high risk of complications and morbidity.    The differential diagnosis includes ACS, arrhythmia, ICH, mass effect, sepsis, electrolyte derangement, appears cold but euvolemic making cardiogenic shock less likely  Additional history obtained: Additional history obtained from EMS  Records reviewed previous admission documents  ED Course and Reassessment: On patient's arrival he was hemodynamically stable but only moaning and withdrawing to noxious stimuli.  Per EMS the family did reverse his DNR and would like the patient to be full code.  Accu-Chek on arrival within normal range.  Plan was for intubation for airway protection, see procedure note above.  The patient had soft blood pressures in the 90s.  Intubation was started on low-dose Levophed.  The patient had no obvious signs of trauma who is sudden change in mental status will have CT head as well as labs including cultures to evaluate for possible sepsis to evaluate for cause of his mental status change and hypotension.  Critical care will be consulted for admission.  Independent  labs interpretation:  The following labs were independently interpreted: Elevated lactate, labs otherwise thus far at baseline, troponin pending  Independent visualization of imaging: - I independently visualized the  following imaging with scope of interpretation limited to determining acute life threatening conditions related to emergency care: CTH, CXR, abd XR, which revealed no acute disease, ETT and OG tube in appropriate position  Consultation: - Consulted or discussed management/test interpretation w/ external professional: critical care  Consideration for admission or further workup: patient requires admission for unresponsiveness Social Determinants of health: N/A    Amount and/or Complexity of Data Reviewed Labs: ordered. Radiology: ordered.  Risk Prescription drug management. Decision regarding hospitalization.          Final Clinical Impression(s) / ED Diagnoses Final diagnoses:  Glasgow coma scale total score 3-8, in the field (EMT or ambulance) [facility])    Rx / DC Orders ED Discharge Orders     None         Rexford Maus, DO 01/30/24 1516

## 2024-01-30 NOTE — ED Triage Notes (Addendum)
 Pt BIBGEMS from [facility] unresponsive for 20 minutes. Last seen normal 10:00 AM. Pain ful stimuli only reponse. AxOx4 at baseline. Started tracking and nodding post EPI gtt.  60/40 sinus brady then 100/40  Agonal respirations with 10 chest compressions - painful response  Periods of apnea  22 Right hand  1/0 Right tibia  Epi drip and NS.

## 2024-01-31 ENCOUNTER — Inpatient Hospital Stay (HOSPITAL_COMMUNITY)

## 2024-01-31 DIAGNOSIS — N1832 Chronic kidney disease, stage 3b: Secondary | ICD-10-CM

## 2024-01-31 DIAGNOSIS — I38 Endocarditis, valve unspecified: Secondary | ICD-10-CM | POA: Diagnosis not present

## 2024-01-31 DIAGNOSIS — N179 Acute kidney failure, unspecified: Secondary | ICD-10-CM | POA: Diagnosis not present

## 2024-01-31 DIAGNOSIS — E162 Hypoglycemia, unspecified: Secondary | ICD-10-CM

## 2024-01-31 DIAGNOSIS — G9340 Encephalopathy, unspecified: Secondary | ICD-10-CM | POA: Diagnosis not present

## 2024-01-31 DIAGNOSIS — I469 Cardiac arrest, cause unspecified: Secondary | ICD-10-CM | POA: Diagnosis not present

## 2024-01-31 LAB — BLOOD CULTURE ID PANEL (REFLEXED) - BCID2

## 2024-01-31 LAB — CBC
HCT: 52.4 % — ABNORMAL HIGH (ref 39.0–52.0)
Hemoglobin: 17.8 g/dL — ABNORMAL HIGH (ref 13.0–17.0)
MCH: 29.4 pg (ref 26.0–34.0)
MCHC: 34 g/dL (ref 30.0–36.0)
MCV: 86.5 fL (ref 80.0–100.0)
Platelets: 142 10*3/uL — ABNORMAL LOW (ref 150–400)
RBC: 6.06 MIL/uL — ABNORMAL HIGH (ref 4.22–5.81)
RDW: 16.2 % — ABNORMAL HIGH (ref 11.5–15.5)
WBC: 7 10*3/uL (ref 4.0–10.5)
nRBC: 0 % (ref 0.0–0.2)

## 2024-01-31 LAB — POCT I-STAT EG7
Acid-Base Excess: 5 mmol/L — ABNORMAL HIGH (ref 0.0–2.0)
Bicarbonate: 33 mmol/L — ABNORMAL HIGH (ref 20.0–28.0)
Calcium, Ion: 1.16 mmol/L (ref 1.15–1.40)
HCT: 56 % — ABNORMAL HIGH (ref 39.0–52.0)
Hemoglobin: 19 g/dL — ABNORMAL HIGH (ref 13.0–17.0)
O2 Saturation: 25 %
Patient temperature: 36.7
Potassium: 4.8 mmol/L (ref 3.5–5.1)
Sodium: 143 mmol/L (ref 135–145)
TCO2: 35 mmol/L — ABNORMAL HIGH (ref 22–32)
pCO2, Ven: 60.2 mmHg — ABNORMAL HIGH (ref 44–60)
pH, Ven: 7.346 (ref 7.25–7.43)
pO2, Ven: 19 mmHg — CL (ref 32–45)

## 2024-01-31 LAB — ECHOCARDIOGRAM COMPLETE
AR max vel: 2.92 cm2
AV Peak grad: 3.3 mmHg
Ao pk vel: 0.91 m/s
Height: 72 in
MV VTI: 4.11 cm2
S' Lateral: 3.1 cm
Weight: 2987.67 [oz_av]

## 2024-01-31 LAB — GLUCOSE, CAPILLARY
Glucose-Capillary: 102 mg/dL — ABNORMAL HIGH (ref 70–99)
Glucose-Capillary: 108 mg/dL — ABNORMAL HIGH (ref 70–99)
Glucose-Capillary: 114 mg/dL — ABNORMAL HIGH (ref 70–99)
Glucose-Capillary: 139 mg/dL — ABNORMAL HIGH (ref 70–99)
Glucose-Capillary: 139 mg/dL — ABNORMAL HIGH (ref 70–99)
Glucose-Capillary: 154 mg/dL — ABNORMAL HIGH (ref 70–99)
Glucose-Capillary: 58 mg/dL — ABNORMAL LOW (ref 70–99)
Glucose-Capillary: 68 mg/dL — ABNORMAL LOW (ref 70–99)
Glucose-Capillary: 88 mg/dL (ref 70–99)
Glucose-Capillary: 96 mg/dL (ref 70–99)

## 2024-01-31 LAB — BASIC METABOLIC PANEL
Anion gap: 10 (ref 5–15)
BUN: 24 mg/dL — ABNORMAL HIGH (ref 8–23)
CO2: 23 mmol/L (ref 22–32)
Calcium: 8.3 mg/dL — ABNORMAL LOW (ref 8.9–10.3)
Chloride: 108 mmol/L (ref 98–111)
Creatinine, Ser: 2.23 mg/dL — ABNORMAL HIGH (ref 0.61–1.24)
GFR, Estimated: 28 mL/min — ABNORMAL LOW (ref >60)
Glucose, Bld: 162 mg/dL — ABNORMAL HIGH (ref 70–99)
Potassium: 4.8 mmol/L (ref 3.5–5.1)
Sodium: 141 mmol/L (ref 135–145)

## 2024-01-31 LAB — CORTISOL: Cortisol, Plasma: 15.6 ug/dL

## 2024-01-31 LAB — TRIGLYCERIDES: Triglycerides: 86 mg/dL (ref <150)

## 2024-01-31 LAB — MAGNESIUM: Magnesium: 2.1 mg/dL (ref 1.7–2.4)

## 2024-01-31 MED ORDER — DEXTROSE 10 % IV SOLN: INTRAVENOUS | Status: DC

## 2024-01-31 MED ORDER — VANCOMYCIN HCL 1750 MG/350ML IV SOLN
1750.0000 mg | Freq: Once | INTRAVENOUS | Status: AC
  Administered 2024-01-31: 1750 mg via INTRAVENOUS
  Filled 2024-01-31: qty 350

## 2024-01-31 MED ORDER — LACTULOSE 10 GM/15ML PO SOLN
10.0000 g | Freq: Two times a day (BID) | ORAL | Status: DC
  Administered 2024-01-31: 10 g
  Filled 2024-01-31 (×2): qty 15

## 2024-01-31 MED ORDER — OLANZAPINE 2.5 MG PO TABS
2.5000 mg | ORAL_TABLET | Freq: Every day | ORAL | Status: DC
  Administered 2024-01-31: 2.5 mg
  Filled 2024-01-31: qty 1

## 2024-01-31 MED ORDER — VANCOMYCIN HCL 1500 MG/300ML IV SOLN
1500.0000 mg | INTRAVENOUS | Status: DC
  Administered 2024-02-02: 1500 mg via INTRAVENOUS
  Filled 2024-01-31: qty 300

## 2024-01-31 MED ORDER — SODIUM CHLORIDE 0.9 % IV SOLN
2.0000 g | INTRAVENOUS | Status: DC
  Administered 2024-01-31 – 2024-02-02 (×3): 2 g via INTRAVENOUS
  Filled 2024-01-31 (×3): qty 20

## 2024-01-31 MED ORDER — DEXTROSE 50 % IV SOLN
12.5000 g | INTRAVENOUS | Status: AC
  Administered 2024-01-31: 12.5 g via INTRAVENOUS
  Filled 2024-01-31: qty 50

## 2024-01-31 MED ORDER — OLANZAPINE 2.5 MG PO TABS: 2.5000 mg | ORAL_TABLET | Freq: Every day | ORAL | Status: DC

## 2024-01-31 MED ORDER — DEXMEDETOMIDINE HCL IN NACL 400 MCG/100ML IV SOLN
0.0000 ug/kg/h | INTRAVENOUS | Status: DC
  Administered 2024-01-31: 0.4 ug/kg/h via INTRAVENOUS
  Filled 2024-01-31: qty 100

## 2024-01-31 MED ORDER — PERFLUTREN LIPID MICROSPHERE
1.0000 mL | INTRAVENOUS | Status: AC | PRN
  Administered 2024-01-31: 2 mL via INTRAVENOUS

## 2024-01-31 MED ORDER — ACETAMINOPHEN 325 MG PO TABS
650.0000 mg | ORAL_TABLET | Freq: Four times a day (QID) | ORAL | Status: DC | PRN
  Administered 2024-01-31: 650 mg
  Filled 2024-01-31: qty 2

## 2024-01-31 MED ORDER — DEXTROSE 50 % IV SOLN
12.5000 g | INTRAVENOUS | Status: AC
  Administered 2024-01-31: 12.5 g via INTRAVENOUS

## 2024-01-31 NOTE — Progress Notes (Signed)
 PHARMACY - PHYSICIAN COMMUNICATION CRITICAL VALUE ALERT - BLOOD CULTURE IDENTIFICATION (BCID)  Steven Myers is an 89 y.o. male who presented to [facility] on 01/30/2024 with a chief complaint of unresponsiveness.   Assessment:  89 year old male admitted with outside of hospital arrest. Now blood cultures showing staph species in both sets of blood cultures.   Name of physician (or Provider) Contacted: 2H CCM rounding team  Current antibiotics: ceftriaxone  Changes to prescribed antibiotics recommended:  Add vancomycin  Results for orders placed or performed during the hospital encounter of 01/30/24  Blood Culture ID Panel (Reflexed) (Collected: 01/30/2024  1:57 PM)  Result Value Ref Range   Enterococcus faecalis NOT DETECTED NOT DETECTED   Enterococcus Faecium NOT DETECTED NOT DETECTED   Listeria monocytogenes NOT DETECTED NOT DETECTED   Staphylococcus species DETECTED (A) NOT DETECTED   Staphylococcus aureus (BCID) NOT DETECTED NOT DETECTED   Staphylococcus epidermidis NOT DETECTED NOT DETECTED   Staphylococcus lugdunensis NOT DETECTED NOT DETECTED   Streptococcus species NOT DETECTED NOT DETECTED   Streptococcus agalactiae NOT DETECTED NOT DETECTED   Streptococcus pneumoniae NOT DETECTED NOT DETECTED   Streptococcus pyogenes NOT DETECTED NOT DETECTED   A.calcoaceticus-baumannii NOT DETECTED NOT DETECTED   Bacteroides fragilis NOT DETECTED NOT DETECTED   Enterobacterales NOT DETECTED NOT DETECTED   Enterobacter cloacae complex NOT DETECTED NOT DETECTED   Escherichia coli NOT DETECTED NOT DETECTED   Klebsiella aerogenes NOT DETECTED NOT DETECTED   Klebsiella oxytoca NOT DETECTED NOT DETECTED   Klebsiella pneumoniae NOT DETECTED NOT DETECTED   Proteus species NOT DETECTED NOT DETECTED   Salmonella species NOT DETECTED NOT DETECTED   Serratia marcescens NOT DETECTED NOT DETECTED   Haemophilus influenzae NOT DETECTED NOT DETECTED   Neisseria meningitidis NOT DETECTED NOT DETECTED    Pseudomonas aeruginosa NOT DETECTED NOT DETECTED   Stenotrophomonas maltophilia NOT DETECTED NOT DETECTED   Candida albicans NOT DETECTED NOT DETECTED   Candida auris NOT DETECTED NOT DETECTED   Candida glabrata NOT DETECTED NOT DETECTED   Candida krusei NOT DETECTED NOT DETECTED   Candida parapsilosis NOT DETECTED NOT DETECTED   Candida tropicalis NOT DETECTED NOT DETECTED   Cryptococcus neoformans/gattii NOT DETECTED NOT DETECTED   Sharin Mons, PharmD, BCPS, BCIDP Infectious Diseases Clinical Pharmacist Phone: [phone] 01/31/2024  11:42 AM

## 2024-01-31 NOTE — Progress Notes (Signed)
 PIV placed by unit RN prior to IV team arrival per primary RN.

## 2024-01-31 NOTE — Progress Notes (Signed)
 Initial Nutrition Assessment  DOCUMENTATION CODES:   Non-severe (moderate) malnutrition in context of chronic illness  INTERVENTION:   If patient does not extubate, recommend:  Vital 1.2@65ml /hr- Initiate at 50ml/hr and increase by 6ml/hr q 8 hours until goal rate is reached.   Free water flushes 30ml q4 hours to maintain tube patency   Regimen provides 1872kcal/day, 117g/day protein and 1449ml/day of free water.   Pt at high refeed risk; recommend monitor potassium, magnesium and phosphorus labs daily until stable  Daily weights   NUTRITION DIAGNOSIS:   Moderate Malnutrition related to chronic illness as evidenced by moderate fat depletion, moderate muscle depletion.  GOAL:   Provide needs based on ASPEN/SCCM guidelines  MONITOR:   Vent status, Labs, Weight trends, Skin, I & O's  REASON FOR ASSESSMENT:   Ventilator    ASSESSMENT:   89 y/o male with h/o end stage cardiac amyloid, CHF, LVH, CKD IV, NSTEMI, HLD, DM, dementia and HTN who is admitted with PEA arrest and shock.  Pt sedated and ventilated. OGT in place (gastric). Plan is for possible extubation today. Will plan to initiate tube feeds if patient does not extubate. Pt is likely at high refeed risk. Per chart, pt appears to be down 15lbs(8%) over the past 6 months. Family at bedside reports pt with fair appetite and oral intake at baseline. Family reports that patient has had a gradual decline in his weight since being at [facility].   Medications reviewed and include: pepcid, heparin, lactulose, ceftriaxone, 10% dextrose @30ml /hr, vancomycin   Labs reviewed: K 4.8 wnl, BUN 24(H), creat 2.23(H), Mg 2.1 wnl Ammonia- 66(H) Cbgs- 139, 154, 108, 68, 58, 102 x 24 hrs  AIC 6.5(H)- 09/2023  Patient is currently intubated on ventilator support MV: 6.8 L/min Temp (24hrs), Avg:99.6 F (37.6 C), Min:97 F (36.1 C), Max:100.4 F (38 C)  MAP >64mmHg   UOP-   NUTRITION - FOCUSED PHYSICAL EXAM:  Flowsheet Row  Most Recent Value  Orbital Region Mild depletion  Upper Arm Region Moderate depletion  Thoracic and Lumbar Region Mild depletion  Buccal Region No depletion  Temple Region Mild depletion  Clavicle Bone Region Moderate depletion  Clavicle and Acromion Bone Region Moderate depletion  Scapular Bone Region Moderate depletion  Dorsal Hand Mild depletion  Patellar Region Severe depletion  Anterior Thigh Region Moderate depletion  Posterior Calf Region Moderate depletion  Edema (RD Assessment) Mild  Hair Reviewed  Eyes Reviewed  Mouth Reviewed  Skin Reviewed  Nails Reviewed   Diet Order:   Diet Order             Diet NPO time specified  Diet effective now                  EDUCATION NEEDS:   No education needs have been identified at this time  Skin:  Skin Assessment: Reviewed RN Assessment  Last BM:  pta  Height:   Ht Readings from Last 1 Encounters:  01/30/24 6' (1.829 m)    Weight:   Wt Readings from Last 1 Encounters:  01/31/24 84.7 kg    Ideal Body Weight:  80.9 kg  BMI:  Body mass index is 25.33 kg/m.  Estimated Nutritional Needs:   Kcal:  1853kcal/day  Protein:  120-135g/day  Fluid:  2.0L/day  Betsey Holiday MS, RD, LDN If unable to be reached, please send secure chat to "RD inpatient" available from 8:00a-4:00p daily

## 2024-01-31 NOTE — Progress Notes (Addendum)
 eLink Physician-Brief Progress Note Patient Name: Steven Myers DOB: [birth year] MRN: [id number]   Date of Service  01/31/2024  HPI/Events of Note  89 year old male SNF resident with cardiac amyloidosis, HTN, DM2, CKD IV who presents after found unresponsive. S/p cardiac arrest.   Concern for myoclonic activity to L hand, limited to one minute. EEG in AM negative for seizures`  eICU Interventions  No intervention currently indicated, continue monitoring   0253 - uptrending temps, no other concerning changes. Tylenol if he actually becomes febrile  0342 - Recurrent hypoglycemic episodes.Will initiate D10 drip  Intervention Category Intermediate Interventions: Change in mental status - evaluation and management  Lenora Gomes 01/31/2024, 12:30 AM

## 2024-01-31 NOTE — H&P (Signed)
   NAME:  Tegan Burnside, MRN:  [id number], DOB:  06/01/1935, LOS: 1 ADMISSION DATE:  01/30/2024, CONSULTATION DATE:  01/30/24 REFERRING MD:  Elayne Snare, DO CHIEF COMPLAINT:  Unresponsive   History of Present Illness:  89 year old male SNF resident with cardiac amyloidosis, HTN, DM2, CKD IV who presents after found unresponsive. LKN 10 AM when receiving his AM meds. EMS found patient bradycardic and hypotensive > started on epi gtt. Patient reportedly in asystole and received 10 compressions without meds with ROSC. Of note, patient is DNR but family reversed for full code. In the ED, patient intubated and started on levophed. CT head NAICA. CXR ETT in place with atelectasis at bases, no effusion. Labs significant for BUN/Cr 32/2.5 (c/w known CKD), LA 4.2>3.2. WBC 3.7, Hg 17 Plt 140. UA neg. Trop 128  Pertinent  Medical History  cardiac amyloidosis, HTN, DM2, CKD IV  Significant Hospital Events: Including procedures, antibiotic start and stop dates in addition to other pertinent events     Interim History / Subjective:  Sedated  Objective   Blood pressure 130/85, pulse 60, temperature 99.3 F (37.4 C), resp. rate (!) 6, height 6' (1.829 m), weight 84.7 kg, SpO2 100%.    Vent Mode: PRVC FiO2 (%):  [40 %-60 %] 40 % Set Rate:  [14 bmp-22 bmp] 16 bmp Vt Set:  [560 mL-620 mL] 620 mL PEEP:  [5 cmH20] 5 cmH20 Plateau Pressure:  [17 cmH20-23 cmH20] 18 cmH20   Intake/Output Summary (Last 24 hours) at 01/31/2024 0945 Last data filed at 01/31/2024 0700 Gross per 24 hour  Intake 557.03 ml  Output 1140 ml  Net -582.97 ml   Filed Weights   01/30/24 1816 01/31/24 0402  Weight: 85.1 kg 84.7 kg    Physical Exam: No distress + cuff leak fixed with additional air Flexion response to pain Pupils pinpoint, disconjugate gaze Abd soft No rashes Lungs don't sound bad  Resolved Hospital Problem list   N/A  Assessment & Plan:  OHCA brief arrest PEA- unclear cause other than underlying  cardiac amyloidosis Post arrest encephalopathy Post arrest vent dependence AKI on CKD3b Dementia with behavioral disturbance Lung nodule- comorbidities do not warrant f/u Persistent hypoglycemia Mildly elevated ammonia  - Hold GDMT for now - Wean D10 for CBG 100-180 - Check cortisol - Change sedation to PRN - Wean levophed - Will need additional GOC talks - Give a couple doses lactulose  Best Practice (right click and "Reselect all SmartList Selections" daily)   Diet/type: NPO: TF if unable to wean from vent today DVT prophylaxis prophylactic heparin  Pressure ulcer(s): N/A GI prophylaxis: PPI Lines: N/A Foley:  Yes, and it is still needed Code Status:  full code Last date of multidisciplinary goals of care discussion [ 3/9] DNR  33 min cc time Myrla Halsted MD PCCM

## 2024-01-31 NOTE — Progress Notes (Signed)
 Pt experienced multiple episodes of hypoglycemia. CBGs of 51, 53, and 68. D50 was given per protocol. Glucose still remained <70. 10% dextrose started at 62mL/hr per MD order

## 2024-01-31 NOTE — Progress Notes (Signed)
 Pharmacy Antibiotic Note  Steven Myers is a 89 y.o. male admitted on 01/30/2024 with bacteremia.  Pharmacy has been consulted for vancomycin dosing.  Received 2 doses of unasyn for aspiration PNA - WBC improved to 7, Tmax 100.2. Scr trending down to 2.23. Bcx growing GPC in 2/4 (in each set) - BCID finding staph species.    Plan: Changed unasyn to ceftriaxone 2g IV every 24 hours to avoid salt load with low EF Start vancomycin 1750 mg IV once then 1500 mg IV every 48 hr (estAUC 471, Vd 0.72, Scr 2.23) Monitor renal fx, cx results, clinical pic  Height: 6' (182.9 cm) Weight: 84.7 kg (186 lb 11.7 oz) IBW/kg (Calculated) : 77.6  Temp (24hrs), Avg:99.6 F (37.6 C), Min:97 F (36.1 C), Max:100.4 F (38 C)  Recent Labs  Lab 01/30/24 1419 01/30/24 1429 01/30/24 1430 01/30/24 1535 01/31/24 0703  WBC 3.7*  --   --   --  7.0  CREATININE 2.12* 2.50*  --   --  2.23*  LATICACIDVEN  --   --  4.2* 3.2*  --     Estimated Creatinine Clearance: 26.1 mL/min (A) (by C-G formula based on SCr of 2.23 mg/dL (H)).    No Known Allergies  Antimicrobials this admission: Unasyn 3/9 > 3/10 Ceftriaxone 3/10>>    Dose adjustments this admission: N/A  Microbiology results: Bcx 3/9: 2/2 GPC - BCID staph species   MRSA PCR 3/9: neg  COVID/Flu PCR 3/9: neg   Thank you for allowing pharmacy to participate in this patient's care,  Sherron Monday, PharmD, BCCCP Clinical Pharmacist  Phone: [phone] 01/31/2024 11:52 AM  Please check AMION for all [facility] Pharmacy phone numbers After 10:00 PM, call Main Pharmacy [phone]

## 2024-01-31 NOTE — Procedures (Signed)
 Extubation Procedure Note  Patient Details:   Name: Onesimo Lingard DOB: 12/01/1934 MRN: [id number]   Airway Documentation:    Vent end date: 01/31/24 Vent end time: 1425   Evaluation  O2 sats: stable throughout Complications: No apparent complications Patient did tolerate procedure well. Bilateral Breath Sounds: Diminished   Yes  Positive cuff leak prior to extubation. Pt placed on 4L [place]  Lajean Manes 01/31/2024, 2:30 PM

## 2024-02-01 ENCOUNTER — Other Ambulatory Visit: Payer: Self-pay

## 2024-02-01 DIAGNOSIS — N1832 Chronic kidney disease, stage 3b: Secondary | ICD-10-CM | POA: Diagnosis not present

## 2024-02-01 DIAGNOSIS — I469 Cardiac arrest, cause unspecified: Secondary | ICD-10-CM | POA: Diagnosis not present

## 2024-02-01 LAB — PHOSPHORUS: Phosphorus: 4.9 mg/dL — ABNORMAL HIGH (ref 2.5–4.6)

## 2024-02-01 LAB — BASIC METABOLIC PANEL
Anion gap: 15 (ref 5–15)
BUN: 23 mg/dL (ref 8–23)
CO2: 24 mmol/L (ref 22–32)
Calcium: 8.7 mg/dL — ABNORMAL LOW (ref 8.9–10.3)
Chloride: 108 mmol/L (ref 98–111)
Creatinine, Ser: 2.13 mg/dL — ABNORMAL HIGH (ref 0.61–1.24)
GFR, Estimated: 30 mL/min — ABNORMAL LOW (ref >60)
Glucose, Bld: 84 mg/dL (ref 70–99)
Potassium: 5.2 mmol/L — ABNORMAL HIGH (ref 3.5–5.1)
Sodium: 147 mmol/L — ABNORMAL HIGH (ref 135–145)

## 2024-02-01 LAB — GLUCOSE, CAPILLARY
Glucose-Capillary: 70 mg/dL (ref 70–99)
Glucose-Capillary: 113 mg/dL — ABNORMAL HIGH (ref 70–99)
Glucose-Capillary: 117 mg/dL — ABNORMAL HIGH (ref 70–99)

## 2024-02-01 MED ORDER — TIMOLOL MALEATE 0.5 % OP SOLN
1.0000 [drp] | Freq: Two times a day (BID) | OPHTHALMIC | Status: DC
  Administered 2024-02-01 – 2024-02-05 (×8): 1 [drp] via OPHTHALMIC
  Filled 2024-02-01 (×2): qty 5

## 2024-02-01 MED ORDER — HALOPERIDOL LACTATE 5 MG/ML IJ SOLN: 5.0000 mg | Freq: Four times a day (QID) | INTRAMUSCULAR | Status: DC | PRN

## 2024-02-01 MED ORDER — OLANZAPINE 5 MG PO TABS
5.0000 mg | ORAL_TABLET | Freq: Every day | ORAL | Status: DC
  Administered 2024-02-01: 5 mg
  Filled 2024-02-01 (×2): qty 1

## 2024-02-01 MED ORDER — HALOPERIDOL LACTATE 5 MG/ML IJ SOLN: 4.0000 mg | Freq: Four times a day (QID) | INTRAMUSCULAR | Status: DC | PRN

## 2024-02-01 MED ORDER — DOCUSATE SODIUM 100 MG PO CAPS: 100.0000 mg | ORAL_CAPSULE | Freq: Two times a day (BID) | ORAL | Status: DC | PRN

## 2024-02-01 MED ORDER — TAFAMIDIS MEGLUMINE (CARDIAC) 20 MG PO CAPS
80.0000 mg | ORAL_CAPSULE | Freq: Every day | ORAL | Status: DC
  Administered 2024-02-01 – 2024-02-05 (×5): 80 mg via ORAL
  Filled 2024-02-01 (×8): qty 4

## 2024-02-01 MED ORDER — BRIMONIDINE TARTRATE 0.2 % OP SOLN
1.0000 [drp] | Freq: Two times a day (BID) | OPHTHALMIC | Status: DC
  Administered 2024-02-01 – 2024-02-05 (×8): 1 [drp] via OPHTHALMIC
  Filled 2024-02-01 (×2): qty 5

## 2024-02-01 MED ORDER — ORAL CARE MOUTH RINSE: 15.0000 mL | OROMUCOSAL | Status: DC | PRN

## 2024-02-01 MED ORDER — MAGNESIUM SULFATE 2 GM/50ML IV SOLN
2.0000 g | Freq: Once | INTRAVENOUS | Status: AC
  Administered 2024-02-01: 2 g via INTRAVENOUS
  Filled 2024-02-01: qty 50

## 2024-02-01 MED ORDER — ACETAMINOPHEN 325 MG PO TABS: 650.0000 mg | ORAL_TABLET | Freq: Four times a day (QID) | ORAL | Status: DC | PRN

## 2024-02-01 MED ORDER — POLYETHYLENE GLYCOL 3350 17 G PO PACK: 17.0000 g | PACK | Freq: Every day | ORAL | Status: DC | PRN

## 2024-02-01 NOTE — Progress Notes (Signed)
Patient is calm and cooperative at this time.

## 2024-02-01 NOTE — Progress Notes (Signed)
 Patient awake and becoming agitated and combative.  Attempting to punch nursing staff.

## 2024-02-01 NOTE — Plan of Care (Signed)
   Problem: Fluid Volume: Goal: Ability to maintain a balanced intake and output will improve Outcome: Progressing   Problem: Skin Integrity: Goal: Risk for impaired skin integrity will decrease Outcome: Progressing   Problem: Safety: Goal: Ability to remain free from injury will improve Outcome: Progressing

## 2024-02-01 NOTE — Progress Notes (Addendum)
   NAME:  Steven Myers, MRN:  [id number], DOB:  28-Nov-1934, LOS: 2 ADMISSION DATE:  01/30/2024, CONSULTATION DATE:  01/30/24 REFERRING MD:  Elayne Snare, DO CHIEF COMPLAINT:  Unresponsive   History of Present Illness:  89 year old male SNF resident with cardiac amyloidosis, HTN, DM2, CKD IV who presents after found unresponsive. LKN 10 AM when receiving his AM meds. EMS found patient bradycardic and hypotensive > started on epi gtt. Patient reportedly in asystole and received 10 compressions without meds with ROSC. Of note, patient is DNR but family reversed for full code. In the ED, patient intubated and started on levophed. CT head NAICA. CXR ETT in place with atelectasis at bases, no effusion. Labs significant for BUN/Cr 32/2.5 (c/w known CKD), LA 4.2>3.2. WBC 3.7, Hg 17 Plt 140. UA neg. Trop 128  Pertinent  Medical History  cardiac amyloidosis, HTN, DM2, CKD IV  Significant Hospital Events: Including procedures, antibiotic start and stop dates in addition to other pertinent events     Interim History / Subjective:  Sundown'd overnight  Objective   Blood pressure 125/84, pulse 98, temperature 98.6 F (37 C), resp. rate 16, height 6' (1.829 m), weight 81.1 kg, SpO2 99%.    Vent Mode: PRVC FiO2 (%):  [40 %] 40 % Set Rate:  [16 bmp] 16 bmp Vt Set:  [620 mL] 620 mL PEEP:  [5 cmH20] 5 cmH20 Plateau Pressure:  [18 cmH20] 18 cmH20   Intake/Output Summary (Last 24 hours) at 02/01/2024 0754 Last data filed at 01/31/2024 1900 Gross per 24 hour  Intake 649.24 ml  Output 1000 ml  Net -350.76 ml   Filed Weights   01/30/24 1816 01/31/24 0402 02/01/24 0500  Weight: 85.1 kg 84.7 kg 81.1 kg    Physical Exam: No distress + cuff leak fixed with additional air Flexion response to pain Pupils pinpoint, disconjugate gaze Abd soft No rashes Lungs don't sound bad  Resolved Hospital Problem list   N/A  Assessment & Plan:  OHCA brief arrest PEA- unclear cause other than underlying  cardiac amyloidosis Post arrest encephalopathy Post arrest vent dependence AKI on CKD3b Dementia with behavioral disturbance Lung nodule- comorbidities do not warrant f/u Persistent hypoglycemia- improved, cortisol equivocal Mildly elevated ammonia GPC bacteremia- staph species, 2/2 bottles, not aureus, echo no e/o IE  Spoke with family 3/10, 1 way extubation done and he is back to near baseline.  Ceftriaxone x 5 days  Vanc duration tentatively 7 days unless repeat cultures positive  SLP consult depending on bedside RN eval  PMT consult, family potentially interested in hospice but want to know options  Zyprexa at bedtime, Qtc correct low for RBBB so can do PRN haldol on top PRN; am EKG ordered just for reassurance  Stable for floor for ongoing care; appreciate TRH taking over starting 02/02/24  Myrla Halsted MD PCCM

## 2024-02-01 NOTE — Progress Notes (Signed)
 Patient appears asleep, Arouses easily. Cooperative.

## 2024-02-01 NOTE — Progress Notes (Signed)
 Patient very agitated, attempting to pull out lines, electrodes and oxygen. Family at bedside, patient seems confused. Precedex restarted.

## 2024-02-01 NOTE — Evaluation (Signed)
 Clinical/Bedside Swallow Evaluation Patient Details  Name: Steven Myers MRN: [id number] Date of Birth: [birth year]  Today's Date: 02/01/2024 Time: SLP Start Time (ACUTE ONLY): 1020 SLP Stop Time (ACUTE ONLY): 1035 SLP Time Calculation (min) (ACUTE ONLY): 15 min  Past Medical History:  Past Medical History:  Diagnosis Date   Bilateral cataracts    Cardiac amyloidosis (HCC) 07/29/2023   - TTE 09/11/22: Inferolateral HK, severe LVH with speckled pattern (consider amyloid), apical thrombus versus prominent trabeculae (consider CMR), EF 45-50, GR 2 DD, GLS -17.8, severely reduced RVSF, mild BAE, mild MR, borderline dilation of ascending aorta (38 mm), RAP 3 - SPEP 06/2023 neg for monoclonal gammopathy - 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 05/2018 Echo: EF 60-65%, no rwma, GrI DD. Mildly dil Ao root. Mild BAE; b. 05/2022 Echo: EF 45-50%, inferior basal HK. Sev LVH, GrI DD, mildly enlarged RV w/ mildly reduced fxn. Trivm MR. AoV sclerosis. Asc Ao 39mm.   Chest pain    a. 03/2007 MV: No isch/infarct; b. 04/2010 Lexi MV: EF 62%. No isch/infarct.   CKD (chronic kidney disease) stage 3, GFR 30-59 ml/min (HCC)    DM (diabetes mellitus) type II controlled with renal manifestation (HCC)    Not on insulin   Essential hypertension    Glaucoma    Optho: Dr. Cathey Endow   History of colonoscopy    Hyperlipidemia associated with type 2 diabetes mellitus (HCC)    Intolerant to statins --> because of elevated LFTs.   Obesity due to excess calories without serious comorbidity    BMI ~32 - mostly truncal obesity (mesomorphic)   Osteoarthritis of hip    Right > Left -- limits walking   RBBB (right bundle branch block)    Venous stasis dermatitis of both lower extremities - with edema    L leg worse than R 2/2 prior injury.; controlled somewhat with support hose   Past Surgical History:  Past Surgical History:  Procedure Laterality Date   NM MYOVIEW LTD  04/2010   LEXISCAN:  EF 62%. NO ISCHEMIA OR INFARCTION. LOW RISK. No RWMA    HPI:  Steven Myers is an 89 y.o. male who presented to ED from [facility] SNF with after out-of-hospital cardiac arrest. Intubated 3/10-11. PMH includes NICM, dementia, CKD 3, DM, HTN, glaucoma, cataracts, OA.    Assessment / Plan / Recommendation  Clinical Impression  Pt presents with normal oropharyngeal swallowing. Upper dentures cleaned and placed. Pt demonstrated thorough mastication, the appearance of a brisk swallow, and no s/s of aspiration.  Needed assist with feeding. Recommend resuming a regular diet, thin liquids; pill with water. D/W family at bedside. No SLP f/u needed.   SLP Visit Diagnosis: Dysphagia, unspecified (R13.10)    Aspiration Risk  No limitations    Diet Recommendation   Thin;Age appropriate regular  Medication Administration: Whole meds with liquid    Other  Recommendations Oral Care Recommendations: Oral care BID    Recommendations for follow up therapy are one component of a multi-disciplinary discharge planning process, led by the attending physician.  Recommendations may be updated based on patient status, additional functional criteria and insurance authorization.  Follow up Recommendations No SLP follow up        Swallow Study   General Date of Onset: 01/31/24 HPI: Steven Myers is an 89 y.o. male who presented to ED from [facility] SNF with after out-of-hospital cardiac arrest. Intubated 3/10-11. PMH includes NICM, dementia, CKD 3, DM,  HTN, glaucoma, cataracts, OA. Type of Study: Bedside Swallow Evaluation Previous Swallow Assessment: no Diet Prior to this Study: NPO Temperature Spikes Noted: No Respiratory Status: Nasal cannula History of Recent Intubation: Yes Total duration of intubation (days): 1 days Date extubated: 01/31/24 Behavior/Cognition: Alert;Cooperative;Pleasant mood Oral Cavity Assessment: Within Functional Limits Oral Care Completed by SLP: No Oral Cavity -  Dentition: Dentures, top Vision: Functional for self-feeding Self-Feeding Abilities: Needs assist Patient Positioning: Upright in bed Baseline Vocal Quality: Normal Volitional Cough: Strong Volitional Swallow: Able to elicit    Oral/Motor/Sensory Function Overall Oral Motor/Sensory Function: Within functional limits   Ice Chips Ice chips: Within functional limits   Thin Liquid Thin Liquid: Within functional limits    Nectar Thick Nectar Thick Liquid: Not tested   Honey Thick Honey Thick Liquid: Not tested   Puree Puree: Within functional limits   Solid     Solid: Within functional limits      Steven Myers Steven Myers 02/01/2024,10:43 AM

## 2024-02-01 NOTE — Evaluation (Signed)
 Physical Therapy Evaluation Patient Details Name: Steven Myers MRN: [id number] DOB: [birth year] Today's Date: 02/01/2024  History of Present Illness  89 year old male who resides at [facility] SNF who presents after found unresponsive. Received 10 compressions without meds with ROSC. PMH: cardiac amyloidosis, HTN, DM2, CKD IV, dementia - sundowns at night   Clinical Impression  Pt admitted from SNF d/t cardiac arrest. Pt poor historian due to dementia/poor memory stating he lives at home with his wife and amb with a cane. Pt however at SNF and is working with therapy and walking "some" per daughter. Pt currently modAx2 sup to sit, maxAX2 for sit to stand. Once medically stable pt appropriate to return to SNF and resume therapy services. Acute PT to follow.        If plan is discharge home, recommend the following: Two people to help with bathing/dressing/bathroom;Two people to help with walking and/or transfers;Assist for transportation;Supervision due to cognitive status   Can travel by private vehicle   No    Equipment Recommendations None recommended by PT  Recommendations for Other Services       Functional Status Assessment Patient has had a recent decline in their functional status and demonstrates the ability to make significant improvements in function in a reasonable and predictable amount of time.     Precautions / Restrictions Precautions Precautions: Fall Restrictions Weight Bearing Restrictions Per Provider Order: No      Mobility  Bed Mobility Overal bed mobility: Needs Assistance Bed Mobility: Supine to Sit, Sidelying to Sit, Rolling Rolling: Mod assist Sidelying to sit: Mod assist, +2 for physical assistance Supine to sit: Mod assist, +2 for physical assistance     General bed mobility comments: pt with good initiation of LEs to EOB, tactile cues to use R UE on bed rail, mod A for trunk elevation    Transfers Overall transfer level: Needs  assistance Equipment used: 2 person hand held assist (face to face transfer with bed pad) Transfers: Sit to/from Stand Sit to Stand: Max assist, +2 physical assistance           General transfer comment: maxAx2 to power up, pt with bilat knee flexion, limited use of UEs, unable to achieve full upright posture. completed 2 sit to stands    Ambulation/Gait         Gait velocity: completed 6 side steps to the R along EOB with maxA to advance R LE and initiate weight shift        Stairs            Wheelchair Mobility     Tilt Bed    Modified Rankin (Stroke Patients Only)       Balance Overall balance assessment: Needs assistance Sitting-balance support: No upper extremity supported, Feet supported Sitting balance-Leahy Scale: Fair     Standing balance support: Bilateral upper extremity supported, During functional activity Standing balance-Leahy Scale: Zero Standing balance comment: dependent on external support                             Pertinent Vitals/Pain Pain Assessment Pain Assessment: No/denies pain    Home Living Family/patient expects to be discharged to:: Skilled nursing facility                   Additional Comments: per chart pt from [facility]. Per patient he lives at home with his wife    Prior Function Prior Level of Function : Needs  assist             Mobility Comments: per patient he walks with a cane however unsure how pt mobilizes at SNF ADLs Comments: suspect assist from facility staff     Extremity/Trunk Assessment   Upper Extremity Assessment Upper Extremity Assessment: Generalized weakness    Lower Extremity Assessment Lower Extremity Assessment: Generalized weakness    Cervical / Trunk Assessment Cervical / Trunk Assessment: Other exceptions Cervical / Trunk Exceptions: forward head  Communication   Communication Communication: No apparent difficulties    Cognition Arousal:  Alert Behavior During Therapy: WFL for tasks assessed/performed   PT - Cognitive impairments: Memory, Orientation, History of cognitive impairments   Orientation impairments: Situation, Time                   PT - Cognition Comments: aware he is at [facility], not oriented to date or situation, pt poor historian, pt able to follow commands and demo's good initiation but requires increased time, pt with poor memory Following commands: Intact       Cueing Cueing Techniques: Verbal cues, Tactile cues     General Comments General comments (skin integrity, edema, etc.): discoloration of LEs, VSS    Exercises     Assessment/Plan    PT Assessment Patient needs continued PT services  PT Problem List Decreased strength;Decreased activity tolerance;Decreased balance;Decreased coordination;Decreased mobility;Decreased cognition;Decreased knowledge of use of DME;Decreased safety awareness       PT Treatment Interventions DME instruction;Gait training;Stair training;Functional mobility training;Therapeutic activities;Therapeutic exercise;Balance training    PT Goals (Current goals can be found in the Care Plan section)  Acute Rehab PT Goals Patient Stated Goal: didn't state PT Goal Formulation: Patient unable to participate in goal setting Time For Goal Achievement: 02/15/24 Potential to Achieve Goals: Fair    Frequency Min 2X/week     Co-evaluation               AM-PAC PT "6 Clicks" Mobility  Outcome Measure Help needed turning from your back to your side while in a flat bed without using bedrails?: A Lot Help needed moving from lying on your back to sitting on the side of a flat bed without using bedrails?: A Lot Help needed moving to and from a bed to a chair (including a wheelchair)?: A Lot Help needed standing up from a chair using your arms (e.g., wheelchair or bedside chair)?: A Lot Help needed to walk in hospital room?: Total Help needed climbing 3-5 steps  with a railing? : Total 6 Click Score: 10    End of Session Equipment Utilized During Treatment: Gait belt;Oxygen Activity Tolerance: Patient tolerated treatment well Patient left: in bed;with call bell/phone within reach;with bed alarm set Nurse Communication: Mobility status (RN present to assist PT during eval) PT Visit Diagnosis: Unsteadiness on feet (R26.81);Muscle weakness (generalized) (M62.81);Difficulty in walking, not elsewhere classified (R26.2)    Time: 5621-3086 PT Time Calculation (min) (ACUTE ONLY): 20 min   Charges:   PT Evaluation $PT Eval Moderate Complexity: 1 Mod   PT General Charges $$ ACUTE PT VISIT: 1 Visit         Lewis Shock, PT, DPT Acute Rehabilitation Services Secure chat preferred Office #: [phone]   Iona Hansen 02/01/2024, 11:01 AM

## 2024-02-02 ENCOUNTER — Encounter (HOSPITAL_COMMUNITY): Payer: Self-pay | Admitting: Pulmonary Disease

## 2024-02-02 ENCOUNTER — Other Ambulatory Visit (HOSPITAL_COMMUNITY): Payer: Self-pay

## 2024-02-02 DIAGNOSIS — Z66 Do not resuscitate: Secondary | ICD-10-CM

## 2024-02-02 DIAGNOSIS — E440 Moderate protein-calorie malnutrition: Secondary | ICD-10-CM

## 2024-02-02 DIAGNOSIS — Z515 Encounter for palliative care: Secondary | ICD-10-CM

## 2024-02-02 DIAGNOSIS — G309 Alzheimer's disease, unspecified: Secondary | ICD-10-CM | POA: Diagnosis not present

## 2024-02-02 DIAGNOSIS — R627 Adult failure to thrive: Secondary | ICD-10-CM

## 2024-02-02 DIAGNOSIS — I469 Cardiac arrest, cause unspecified: Secondary | ICD-10-CM | POA: Diagnosis not present

## 2024-02-02 DIAGNOSIS — F02C0 Dementia in other diseases classified elsewhere, severe, without behavioral disturbance, psychotic disturbance, mood disturbance, and anxiety: Secondary | ICD-10-CM

## 2024-02-02 LAB — CULTURE, BLOOD (ROUTINE X 2)

## 2024-02-02 LAB — GLUCOSE, CAPILLARY
Glucose-Capillary: 44 mg/dL — CL (ref 70–99)
Glucose-Capillary: 77 mg/dL (ref 70–99)
Glucose-Capillary: 142 mg/dL — ABNORMAL HIGH (ref 70–99)
Glucose-Capillary: 85 mg/dL (ref 70–99)
Glucose-Capillary: 118 mg/dL — ABNORMAL HIGH (ref 70–99)

## 2024-02-02 MED ORDER — METOPROLOL SUCCINATE ER 25 MG PO TB24
25.0000 mg | ORAL_TABLET | Freq: Every day | ORAL | Status: DC
  Administered 2024-02-02 – 2024-02-05 (×4): 25 mg via ORAL
  Filled 2024-02-02 (×4): qty 1

## 2024-02-02 MED ORDER — CEFAZOLIN SODIUM-DEXTROSE 2-4 GM/100ML-% IV SOLN
2.0000 g | Freq: Two times a day (BID) | INTRAVENOUS | Status: AC
  Administered 2024-02-03 – 2024-02-04 (×4): 2 g via INTRAVENOUS
  Filled 2024-02-02 (×4): qty 100

## 2024-02-02 MED ORDER — OLANZAPINE 5 MG PO TABS
5.0000 mg | ORAL_TABLET | Freq: Every day | ORAL | Status: DC
  Administered 2024-02-03 – 2024-02-04 (×2): 5 mg via ORAL
  Filled 2024-02-02 (×3): qty 1

## 2024-02-02 MED ORDER — QUETIAPINE FUMARATE 25 MG PO TABS: 12.5000 mg | ORAL_TABLET | Freq: Every day | ORAL | Status: DC

## 2024-02-02 NOTE — TOC CM/SW Note (Signed)
 Transition of Care [facility]) - Inpatient Brief Assessment   Patient Details  Name: Steven Myers MRN: [id number] Date of Birth: February 09, 1935  Transition of [facility] CM/SW Contact:    Tom-Johnson, Hershal Coria, RN Phone Number: 02/02/2024, 3:50 PM   Clinical Narrative:  Patient presented to the ED after being found unresponsive at [facility] SNF.  Patient was intubated on admit, extubated on 01/31/24, on 4L O2.  Family has GOC meeting with palliative, plan to return home with Hospice care through [facility] and patient is active with them with Palliative services. Shawn with [facility] notified and acceptance noted.   CM will continue to follow as patient progresses with care towards discharge.               Transition of Care Asessment:

## 2024-02-02 NOTE — Consult Note (Signed)
 [id number]                           Consultation Note Date: 02/02/2024   Patient Name: Steven Myers  DOB: November 17, 1935  MRN: [id number]  Age / Sex: 89 y.o., male  PCP: Renford Dills, MD Referring Physician: Lonia Blood, MD  Reason for Consultation: Establishing goals of care  HPI/Patient Profile: 89 y.o. male  admitted on 01/30/2024   SNF resident with cardiac amyloidosis, HTN, DM2, CKD IV, dementia who presents after found unresponsive. LKN 10 AM when receiving his AM meds. EMS found patient bradycardic and hypotensive > started on epi gtt.   Patient reportedly in asystole and received 10 compressions without meds with ROSC. Of note, patient is DNR but family reversed for full code.   In the ED, patient intubated and started on levophed. Successfully extubated on 01/31/2024.  Today is day 3 of this hospitalization.    Family face treatment option decisions, advanced directive decisions and anticipatory care needs.   Clinical Assessment and Goals of Care:  This NP Lorinda Creed reviewed medical records, received report from team, assessed the patient and then meet at the patient's bedside /along with his son Steven Myers and his wife, patient's wife and 2 daughters on the telephone to discuss diagnosis, prognosis, GOC, EOL wishes disposition and options.   Concept of Palliative Care was introduced as specialized medical care for people and their families living with serious illness.  If focuses on providing relief from the symptoms and stress of a serious illness.  The goal is to improve quality of life for both the patient and the family. Values and goals of care important to patient and family were attempted to be elicited.  Detailed education regarding current medical situation; cardiac disease, dementia, adult failure to thrive and natural trajectory at EOL.    A  discussion was had today regarding advanced directives.  Concepts  specific to code status, artifical feeding and hydration, continued IV antibiotics and rehospitalization was had.    Education offered on the  difference between a aggressive medical intervention path  and a palliative comfort care path for this patient at this time was offered.    MOST form introduced and a hard copy left for family to review.  Education offered on hospice benefit: philosophy and eligibility.       Son has spoken directly with hospice liaison at [facility].   Natural trajectory and expectations at EOL were discussed.  Questions and concerns addressed.  Family  encouraged to call with questions or concerns.     PMT will continue to support holistically.      NEXT OF KIN    SUMMARY OF RECOMMENDATIONS    Code Status/Advance Care Planning: DNR MOST form needs to be re-addressed after family determine clear GOCs, previous completed form in EMR  Palliative Prophylaxis:  Aspiration, Bowel Regimen, Delirium Protocol, and Frequent Pain Assessment  Additional Recommendations (Limitations, Scope, Preferences): Avoid Hospitalization and No Artificial Feeding  Psycho-social/Spiritual:  Desire for further Chaplaincy support:no Additional Recommendations: Education on Hospice  Prognosis:  < 6 months  Discharge Planning:   Family is still I  discussion  as they makes decisions regarding next steps in treatment plan and discharge options.       Primary Diagnoses: Present on Admission:  Cardiac arrest [facility])   I have reviewed the medical record, interviewed the patient and family, and examined the patient. The following aspects are pertinent.  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 05/2018 Echo: EF 60-65%, no rwma, GrI DD. Mildly dil Ao root. Mild BAE; b. 05/2022 Echo: EF 45-50%, inferior basal HK. Sev LVH, GrI DD, mildly enlarged RV w/ mildly reduced fxn. Trivm MR. AoV sclerosis. Asc Ao 39mm.   Chest pain    a. 03/2007 MV: No isch/infarct; b. 04/2010 Lexi MV: EF 62%. No isch/infarct.   CKD (chronic kidney disease) stage 3, GFR 30-59 ml/min (HCC)    DM (diabetes mellitus) type II controlled with renal manifestation (HCC)    Not on insulin   Essential hypertension    Glaucoma    Optho: Dr. Cathey Endow   History of colonoscopy    Hyperlipidemia associated with type 2 diabetes mellitus (HCC)    Intolerant to statins --> because of elevated LFTs.   Obesity due to excess calories without serious comorbidity    BMI ~32 - mostly truncal obesity (mesomorphic)   Osteoarthritis of hip    Right > Left -- limits walking   RBBB (right bundle branch block)    Venous stasis dermatitis of both lower extremities - with edema    L leg worse than R 2/2 prior injury.; controlled somewhat with support hose   Social History   Socioeconomic History   Marital status: Married    Spouse name: Not on file   Number of children: 2   Years of education: Not on file   Highest education level: Not on file  Occupational History   Occupation: Paediatric nurse    Comment: self employed  Tobacco Use   Smoking status: Former    Current packs/day: 0.00    Types: Cigarettes    Quit date: 1980    Years since quitting: 45.2   Smokeless tobacco: Never   Tobacco comments:    quit 40 years ago  Vaping Use   Vaping status: Never Used  Substance and Sexual Activity   Alcohol use: Not Currently    Comment: quit over 20 yr ago   Drug use: No   Sexual activity: Not Currently  Other Topics Concern   Not on file  Social History  Narrative   He is married to his wife -Steven Dandy (Donnellson) Steven Myers.   Accompanied by his daughter.   Still works as a Paediatric nurse    Slow decline in activity level - no routine exercise   Social Drivers of Corporate investment banker Strain: Not on file  Food Insecurity: No Food Insecurity (02/01/2024)   Hunger Vital Sign    Worried About Running Out of Food in the Last Year: Never true    Ran Out of Food in the Last Year: Never true  Transportation Needs: No Transportation Needs (02/01/2024)   PRAPARE -  Administrator, Civil Service (Medical): No    Lack of Transportation (Non-Medical): No  Physical Activity: Not on file  Stress: Not on file  Social Connections: Socially Integrated (02/01/2024)   Social Connection and Isolation Panel [NHANES]    Frequency of Communication with Friends and Family: Three times a week    Frequency of Social Gatherings with Friends and Family: Twice a week    Attends Religious Services: More than 4 times per year    Active Member of [employer] or Organizations: Yes    Attends Banker Meetings: 1 to 4 times per year    Marital Status: Married   Family History  Problem Relation Age of Onset   Diabetes Mother    Heart disease Father 63   Colon cancer Neg Hx    Scheduled Meds:  brimonidine  1 drop Both Eyes BID   Chlorhexidine Gluconate Cloth  6 each Topical Daily   heparin  5,000 Units Subcutaneous Q8H   metoprolol succinate  25 mg Oral Daily   OLANZapine  5 mg Oral QHS   Tafamidis Meglumine (Cardiac)  80 mg Oral Daily   timolol  1 drop Both Eyes BID   Continuous Infusions:  cefTRIAXone (ROCEPHIN)  IV 2 g (02/02/24 0931)   vancomycin 1,500 mg (02/02/24 0952)   PRN Meds:.acetaminophen, docusate sodium, haloperidol lactate, mouth rinse, polyethylene glycol Medications Prior to Admission:  Prior to Admission medications   Medication Sig Start Date End Date Taking? Authorizing Provider  Artificial Tears ophthalmic solution Place 1 drop  into both eyes in the morning, at noon, in the evening, and at bedtime.   Yes [provider]  aspirin 81 MG tablet Take 81 mg by mouth daily.     Yes [provider]  brimonidine-timolol (COMBIGAN) 0.2-0.5 % ophthalmic solution Place 1 drop into both eyes in the morning and at bedtime.   Yes [provider]  cyanocobalamin 1000 MCG tablet Take 1 tablet (1,000 mcg total) by mouth daily. 11/27/23  Yes Zannie Cove, MD  empagliflozin (JARDIANCE) 10 MG TABS tablet Take 1 tablet (10 mg total) by mouth daily before breakfast. 08/11/23  Yes Weaver, Scott T, PA-C  ezetimibe (ZETIA) 10 MG tablet Take 10 mg by mouth daily. 03/21/18  Yes [provider]  latanoprost (XALATAN) 0.005 % ophthalmic solution Place 1 drop into both eyes at bedtime. 11/09/23  Yes [provider]  metoprolol succinate (TOPROL-XL) 25 MG 24 hr tablet Take 1 tablet (25 mg total) by mouth daily. Take with or immediately following a meal. 11/26/23  Yes Zannie Cove, MD  OLANZapine (ZYPREXA) 2.5 MG tablet Take 2.5 mg by mouth at bedtime.   Yes [provider]  potassium chloride SA (KLOR-CON M) 20 MEQ tablet Take 1 tablet (20 mEq total) by mouth daily. 11/26/23  Yes Zannie Cove, MD  RHOPRESSA 0.02 % SOLN Place 1 drop into both eyes at bedtime. 02/02/23  Yes [provider]  Tafamidis Meglumine, Cardiac, (VYNDAQEL) 20 MG CAPS Take 4 capsules (80 mg total) by mouth daily. 10/18/23  Yes Sabharwal, Aditya, DO  timolol (TIMOPTIC) 0.5 % ophthalmic solution Place 1 drop into both eyes 2 (two) times daily.   Yes [provider]  torsemide (DEMADEX) 20 MG tablet Take 20-40 mg by mouth See admin instructions. Take two tablets by mouth in the morning (40 mg) . Take one tablet by mouth in the afternoon (20 mg).   Yes [provider]  Travoprost, BAK Free, (TRAVATAN  Z) 0.004 % SOLN ophthalmic solution Place 1 drop into both eyes at bedtime.   Yes [provider]    No Known Allergies Review of Systems  Unable to perform ROS: Dementia    Physical Exam Cardiovascular:     Rate and Rhythm: Normal rate.  Pulmonary:     Effort: Pulmonary effort is normal.  Skin:    General: Skin is warm and dry.  Neurological:     Mental Status: He is alert. He is confused.     Motor: Weakness present.     Vital Signs: BP (!) 140/106 (BP Location: Right Arm)   Pulse 98   Temp 98.5 F (36.9 C)   Resp 18   Ht 6' (1.829 m)   Wt 81.1 kg   SpO2 100%   BMI 24.25 kg/m  Pain Scale: 0-10   Pain Score: 0-No pain   SpO2: SpO2: 100 % O2 Device:SpO2: 100 % O2 Flow Rate: .O2 Flow Rate (L/min): 3 L/min  IO: Intake/output summary:  Intake/Output Summary (Last 24 hours) at 02/02/2024 1247 Last data filed at 02/02/2024 0740 Gross per 24 hour  Intake 830 ml  Output 1326 ml  Net -496 ml    LBM: Last BM Date : 02/01/24 Baseline Weight: Weight: 85.1 kg Most recent weight: Weight: 81.1 kg     Palliative Assessment/Data:  40 % at best   Discussed with attending and treatment team via secure chat  Family will call PMT with questions or concerns  Time:   75 minutes  Signed by: Lorinda Creed, NP   Please contact Palliative Medicine Team phone at [phone] for questions and concerns.  For individual provider: See Loretha Stapler

## 2024-02-02 NOTE — Care Management Important Message (Signed)
 Important Message  Patient Details  Name: Steven Myers MRN: [id number] Date of Birth: 1935/04/26   Important Message Given:  Yes - Medicare IM     Dorena Bodo 02/02/2024, 1:44 PM

## 2024-02-02 NOTE — Plan of Care (Signed)
  Problem: Metabolic: Goal: Ability to maintain appropriate glucose levels will improve Outcome: Progressing   Problem: Nutritional: Goal: Maintenance of adequate nutrition will improve Outcome: Progressing   Problem: Skin Integrity: Goal: Risk for impaired skin integrity will decrease Outcome: Progressing   Problem: Clinical Measurements: Goal: Ability to maintain clinical measurements within normal limits will improve Outcome: Progressing Goal: Will remain free from infection Outcome: Progressing   Problem: Pain Managment: Goal: General experience of comfort will improve and/or be controlled Outcome: Progressing   Problem: Safety: Goal: Ability to remain free from injury will improve Outcome: Progressing   Problem: Skin Integrity: Goal: Risk for impaired skin integrity will decrease Outcome: Progressing

## 2024-02-02 NOTE — Progress Notes (Addendum)
 Steven Myers  EAV:[id number] DOB: 07/19/1935 DOA: 01/30/2024 PCP: Renford Dills, MD    Brief Narrative:  89 year old SNF resident with a history of cardiac amyloidosis, HTN, DM2, and CKD stage IV who was found unresponsive at his facility.  EMS found the patient to be bradycardic and hypotensive.  An epinephrine drip was initiated.  He reportedly declined in the asystole and received approximately 10 chest compressions without meds with ROSC.  Of note patient had been previously DNR but family reversed to full code.  In the ER the patient was intubated and Levophed was initiated.  CT of the head was without acute finding.  Significant Events: 3/9 [facility]SCA / brief PEA - intubated in ED - levophed - admit to ICU 3/9 reconfirmed DNR status 3/10 extubated 3/12 [facility] assumed care   Goals of Care:   Code Status: Do not attempt resuscitation (DNR) PRE-ARREST INTERVENTIONS DESIRED   DVT prophylaxis: heparin injection 5,000 Units Start: 01/30/24 1645   Interim Hx: Afebrile.  Some mild tachycardia with heart rates 97-108.  Blood pressure stable.  Oxygen saturation stable.  Resting comfortably in bed at the time of visit with no complaints.  Pleasant and interactive.  Assessment & Plan:  Out-of-hospital cardiac arrest - brief PEA Etiology unclear -likely related to cardiac amyloidosis  Goals of care One-way extubation completed 3/10 by PCCM -Palliative Care now discussing long-term goals with family  Postarrest encephalopathy -baseline dementia with behavioral disturbance Continue nightly Zyprexa  Postarrest ventilator dependence One-way extubation completed 3/10  Staph hemolyticus bacteremia 2/2 bottles  No evidence of infective endocarditis on TTE - repeat cultures 3/10 thus far NGTD - narrow abx coverage today as f/u has noted methicillin sensitivity   AKI on CKD stage IIIb Creatinine has been steadily improving  Recent Labs  Lab 01/30/24 1419 01/30/24 1429 01/31/24 0703  02/01/24 0257  CREATININE 2.12* 2.50* 2.23* 2.13*    Incidentally noted lung nodule No indication for follow-up in this clinical situation  Persistent hypoglycemia Now improved -cortisol equivocal -monitor intermittently  Family Communication: I spoke with the patient's daughter-in-law at bedside during my visit. Disposition: SNF suggested per PT -family investigating the option of returning home with hospice care   Objective: Blood pressure (!) 140/106, pulse 98, temperature 98.5 F (36.9 C), resp. rate 18, height 6' (1.829 m), weight 81.1 kg, SpO2 100%.  Intake/Output Summary (Last 24 hours) at 02/02/2024 0839 Last data filed at 02/02/2024 0500 Gross per 24 hour  Intake 394.34 ml  Output 1236 ml  Net -841.66 ml   Filed Weights   01/30/24 1816 01/31/24 0402 02/01/24 0500  Weight: 85.1 kg 84.7 kg 81.1 kg    Examination: General: No acute respiratory distress Lungs: Clear to auscultation bilaterally without wheezes or crackles Cardiovascular: Regular rate and rhythm without murmur gallop or rub normal S1 and S2 Abdomen: Nontender, nondistended, soft, bowel sounds positive, no rebound, no ascites, no appreciable mass Extremities: No significant cyanosis, clubbing, or edema bilateral lower extremities  CBC: Recent Labs  Lab 01/30/24 1419 01/30/24 1429 01/30/24 1453 01/31/24 0703 01/31/24 1711  WBC 3.7*  --   --  7.0  --   NEUTROABS 2.7  --   --   --   --   HGB 17.0   < > 19.0* 17.8* 19.0*  HCT 54.1*   < > 56.0* 52.4* 56.0*  MCV 92.6  --   --  86.5  --   PLT 140*  --   --  142*  --    < > =  values in this interval not displayed.   Basic Metabolic Panel: Recent Labs  Lab 01/30/24 1419 01/30/24 1429 01/30/24 1453 01/31/24 0703 01/31/24 1711 02/01/24 0257  NA 133* 139   < > 141 143 147*  K 3.5 4.3   < > 4.8 4.8 5.2*  CL 102 102  --  108  --  108  CO2 22  --   --  23  --  24  GLUCOSE 464* 270*  --  162*  --  84  BUN 20 32*  --  24*  --  23  CREATININE 2.12*  2.50*  --  2.23*  --  2.13*  CALCIUM 6.8*  --   --  8.3*  --  8.7*  MG 1.9  --   --  2.1  --   --   PHOS  --   --   --   --   --  4.9*   < > = values in this interval not displayed.   GFR: Estimated Creatinine Clearance: 27.3 mL/min (A) (by C-G formula based on SCr of 2.13 mg/dL (H)).   Scheduled Meds:  brimonidine  1 drop Both Eyes BID   Chlorhexidine Gluconate Cloth  6 each Topical Daily   heparin  5,000 Units Subcutaneous Q8H   OLANZapine  5 mg Per Tube QHS   Tafamidis Meglumine (Cardiac)  80 mg Oral Daily   timolol  1 drop Both Eyes BID   Continuous Infusions:  cefTRIAXone (ROCEPHIN)  IV Stopped (02/01/24 1024)   vancomycin       LOS: 3 days   Lonia Blood, MD [facility] Office  [phone] Pager - Text Page per Loretha Stapler  If 7PM-7AM, please contact night-coverage per Amion 02/02/2024, 8:39 AM

## 2024-02-03 DIAGNOSIS — I469 Cardiac arrest, cause unspecified: Secondary | ICD-10-CM | POA: Diagnosis not present

## 2024-02-03 LAB — COMPREHENSIVE METABOLIC PANEL
ALT: 9 U/L (ref 0–44)
AST: 17 U/L (ref 15–41)
Albumin: 2.7 g/dL — ABNORMAL LOW (ref 3.5–5.0)
Alkaline Phosphatase: 62 U/L (ref 38–126)
Anion gap: 13 (ref 5–15)
BUN: 24 mg/dL — ABNORMAL HIGH (ref 8–23)
CO2: 23 mmol/L (ref 22–32)
Calcium: 8.7 mg/dL — ABNORMAL LOW (ref 8.9–10.3)
Chloride: 106 mmol/L (ref 98–111)
Creatinine, Ser: 1.79 mg/dL — ABNORMAL HIGH (ref 0.61–1.24)
GFR, Estimated: 36 mL/min — ABNORMAL LOW (ref >60)
Glucose, Bld: 127 mg/dL — ABNORMAL HIGH (ref 70–99)
Potassium: 4.4 mmol/L (ref 3.5–5.1)
Sodium: 142 mmol/L (ref 135–145)
Total Bilirubin: 1 mg/dL (ref 0.0–1.2)
Total Protein: 6.4 g/dL — ABNORMAL LOW (ref 6.5–8.1)

## 2024-02-03 LAB — GLUCOSE, CAPILLARY
Glucose-Capillary: 130 mg/dL — ABNORMAL HIGH (ref 70–99)
Glucose-Capillary: 136 mg/dL — ABNORMAL HIGH (ref 70–99)
Glucose-Capillary: 140 mg/dL — ABNORMAL HIGH (ref 70–99)

## 2024-02-03 LAB — CBC
HCT: 54.6 % — ABNORMAL HIGH (ref 39.0–52.0)
Hemoglobin: 17.9 g/dL — ABNORMAL HIGH (ref 13.0–17.0)
MCH: 29.2 pg (ref 26.0–34.0)
MCHC: 32.8 g/dL (ref 30.0–36.0)
MCV: 88.9 fL (ref 80.0–100.0)
Platelets: 136 10*3/uL — ABNORMAL LOW (ref 150–400)
RBC: 6.14 MIL/uL — ABNORMAL HIGH (ref 4.22–5.81)
RDW: 17.5 % — ABNORMAL HIGH (ref 11.5–15.5)
WBC: 6.2 10*3/uL (ref 4.0–10.5)
nRBC: 0 % (ref 0.0–0.2)

## 2024-02-03 LAB — MAGNESIUM: Magnesium: 2.4 mg/dL (ref 1.7–2.4)

## 2024-02-03 MED ORDER — ENSURE ENLIVE PO LIQD
237.0000 mL | Freq: Two times a day (BID) | ORAL | Status: DC
  Administered 2024-02-04 – 2024-02-05 (×3): 237 mL via ORAL

## 2024-02-03 NOTE — Progress Notes (Signed)
 [facility] Liaison Note  Received request from [place], Transitions of Care Manager, for hospice services at home after discharge. Spoke with son, Earna Coder, and other family by phone to initiate education related to hospice philosophy, services, and team approach to care. Patient/family verbalized understanding of information given.   Per discussion, the plan will be for the family to discuss and follow up with me regarding their decision.   [facility] information and contact numbers given to [place]. Above information shared with Transitions of Care Manager. Please call with any questions or concerns.   Thank you for the opportunity to participate in this patient's care.   Glenna Fellows BSN, Charity fundraiser, OCN [employer] [phone]

## 2024-02-03 NOTE — Progress Notes (Signed)
 Steven Myers  ZOX:[id number] DOB: 11-27-1934 DOA: 01/30/2024 PCP: Renford Dills, MD    Brief Narrative:  89 year old SNF resident with a history of cardiac amyloidosis, HTN, DM2, and CKD stage IV who was found unresponsive at his facility.  EMS found the patient to be bradycardic and hypotensive.  An epinephrine drip was initiated.  He reportedly declined into asystole and received approximately 10 chest compressions without meds with ROSC.  Of note patient had been previously DNR but family reversed to full code acutely.  In the ER the patient was intubated and Levophed was initiated.  CT of the head was without acute finding.  Significant Events: 3/9 [facility] / brief PEA - intubated in ED - levophed - admit to ICU 3/9 EEG w/o seizure activity  3/9 reconfirmed DNR status 3/10 extubated 3/12 [facility] assumed care   Goals of Care:   Code Status: Do not attempt resuscitation (DNR) PRE-ARREST INTERVENTIONS DESIRED   DVT prophylaxis: heparin injection 5,000 Units Start: 01/30/24 1645   Interim Hx: No acute events reported overnight.  Afebrile.  Vital signs stable.  Resting comfortably in bed at the time of my visit.  No family present.  Assessment & Plan:  Out-of-hospital cardiac arrest - brief PEA Etiology unclear - likely related to cardiac amyloidosis  Goals of care One-way extubation completed 3/10 by PCCM - Palliative Care now discussing long-term goals with family  Postarrest encephalopathy - baseline dementia with behavioral disturbance Continue nightly Zyprexa  Postarrest ventilator dependence One-way extubation completed 3/10  Staph hemolyticus bacteremia 2/2 bottles  No evidence of infective endocarditis on TTE - repeat cultures 3/10 thus far NGTD - narrowed abx coverage based on sensitivities -depending upon goals of care can transition to oral antibiotic at time of discharge or simply discontinue antibiotic therapy  AKI on CKD stage IIIb Creatinine has been steadily  improving  Recent Labs  Lab 01/30/24 1419 01/30/24 1429 01/31/24 0703 02/01/24 0257 02/03/24 0340  CREATININE 2.12* 2.50* 2.23* 2.13* 1.79*    Incidentally noted lung nodule No indication for follow-up in this clinical situation  Persistent hypoglycemia Now improved -cortisol equivocal -has stabilized at this time  Family Communication: No family present at the time of my visit today Disposition: SNF suggested per PT - family investigating the option of returning home with hospice care   Objective: Blood pressure (!) 135/101, pulse (!) 113, temperature 98.5 F (36.9 C), temperature source Oral, resp. rate 19, height 6' (1.829 m), weight 81.1 kg, SpO2 99%.  Intake/Output Summary (Last 24 hours) at 02/03/2024 0828 Last data filed at 02/03/2024 0649 Gross per 24 hour  Intake 240 ml  Output 1150 ml  Net -910 ml   Filed Weights   01/30/24 1816 01/31/24 0402 02/01/24 0500  Weight: 85.1 kg 84.7 kg 81.1 kg    Examination: General: No acute respiratory distress Lungs: Clear to auscultation bilaterally without wheezes or crackles Cardiovascular: RRR without murmur or rub Abdomen: Nontender, nondistended, soft, bowel sounds positive, no rebound, no ascites, no appreciable mass Extremities: No significant cyanosis, clubbing, or edema bilateral lower extremities  CBC: Recent Labs  Lab 01/30/24 1419 01/30/24 1429 01/31/24 0703 01/31/24 1711 02/03/24 0340  WBC 3.7*  --  7.0  --  6.2  NEUTROABS 2.7  --   --   --   --   HGB 17.0   < > 17.8* 19.0* 17.9*  HCT 54.1*   < > 52.4* 56.0* 54.6*  MCV 92.6  --  86.5  --  88.9  PLT  140*  --  142*  --  136*   < > = values in this interval not displayed.   Basic Metabolic Panel: Recent Labs  Lab 01/30/24 1419 01/30/24 1429 01/31/24 0703 01/31/24 1711 02/01/24 0257 02/03/24 0340  NA 133*   < > 141 143 147* 142  K 3.5   < > 4.8 4.8 5.2* 4.4  CL 102   < > 108  --  108 106  CO2 22  --  23  --  24 23  GLUCOSE 464*   < > 162*  --   84 127*  BUN 20   < > 24*  --  23 24*  CREATININE 2.12*   < > 2.23*  --  2.13* 1.79*  CALCIUM 6.8*  --  8.3*  --  8.7* 8.7*  MG 1.9  --  2.1  --   --  2.4  PHOS  --   --   --   --  4.9*  --    < > = values in this interval not displayed.   GFR: Estimated Creatinine Clearance: 32.5 mL/min (A) (by C-G formula based on SCr of 1.79 mg/dL (H)).   Scheduled Meds:  brimonidine  1 drop Both Eyes BID   Chlorhexidine Gluconate Cloth  6 each Topical Daily   heparin  5,000 Units Subcutaneous Q8H   metoprolol succinate  25 mg Oral Daily   OLANZapine  5 mg Oral QHS   Tafamidis Meglumine (Cardiac)  80 mg Oral Daily   timolol  1 drop Both Eyes BID   Continuous Infusions:   ceFAZolin (ANCEF) IV       LOS: 4 days   Lonia Blood, MD [facility] Office  [phone] Pager - Text Page per Loretha Stapler  If 7PM-7AM, please contact night-coverage per Amion 02/03/2024, 8:28 AM

## 2024-02-03 NOTE — Progress Notes (Addendum)
 Nutrition Follow-up  DOCUMENTATION CODES:   Non-severe (moderate) malnutrition in context of chronic illness  INTERVENTION:  Initiate Ensure Enlive BID for comfort Continue liberalized diet Monitor GOC outcome  NUTRITION DIAGNOSIS:  Moderate Malnutrition related to chronic illness as evidenced by moderate fat depletion, moderate muscle depletion. - remains applicable  GOAL:  Provide needs based on ASPEN/SCCM guidelines - progressing  MONITOR:  Vent status, Labs, Weight trends, Skin, I & O's  REASON FOR ASSESSMENT:  Ventilator    ASSESSMENT:  89 y/o male with h/o end stage cardiac amyloid, CHF, LVH, CKD IV, NSTEMI, HLD, DM, dementia and HTN who is admitted with PEA arrest and shock.  3/9 admitted/intubated in the ED, EEG 3/10 extubated 3/13 GOC meeting; awaiting family decision  Patient medically stable at this time, however family having GOC discussion. Patient may be returning home with hospice. Intake remains adequate, per documentation. Recommend nutrition supplement for comfort and not aggressive nutrition support. Will d/c depending on GOC outcome.  Average Meal Intake: 3/11: 1005 x1 documented meal 3/12: 100% x1 documented meal  Some weight loss observed during admission which is consistent with patient trajectory and hospital course up until this point.  Admit Weight: 85.1  Current Weight: 81.1kg  Meds: IV ABX  Labs:  Na+ 147>142 (wdl) K+ 5.2>4.4 (wdl) Hgb 65.7>84>69.6 (H) CBGs 84-127 x24 hours A1c 6.5 (09/2023)  Diet Order:   Diet Order             Diet regular Room service appropriate? Yes with Assist; Fluid consistency: Thin  Diet effective now            EDUCATION NEEDS:   No education needs have been identified at this time  Skin:  Skin Assessment: Reviewed RN Assessment  Last BM:  3/12  Height:  Ht Readings from Last 1 Encounters:  01/30/24 6' (1.829 m)   Weight:  Wt Readings from Last 1 Encounters:  02/01/24 81.1 kg   Ideal Body  Weight:  80.9 kg  BMI:  Body mass index is 24.25 kg/m.  Estimated Nutritional Needs:   Kcal:  1750-1950  Protein:  95-110g  Fluid:  2.0L/day  Myrtie Cruise MS, RD, LDN Registered Dietitian Clinical Nutrition RD Inpatient Contact Info in Amion

## 2024-02-04 ENCOUNTER — Encounter (HOSPITAL_COMMUNITY): Payer: Self-pay | Admitting: Pulmonary Disease

## 2024-02-04 ENCOUNTER — Ambulatory Visit: Payer: Medicare Other | Admitting: Podiatry

## 2024-02-04 DIAGNOSIS — I469 Cardiac arrest, cause unspecified: Secondary | ICD-10-CM | POA: Diagnosis not present

## 2024-02-04 LAB — GLUCOSE, CAPILLARY
Glucose-Capillary: 101 mg/dL — ABNORMAL HIGH (ref 70–99)
Glucose-Capillary: 217 mg/dL — ABNORMAL HIGH (ref 70–99)

## 2024-02-04 NOTE — Plan of Care (Signed)
  Problem: Coping: Goal: Ability to adjust to condition or change in health will improve Outcome: Progressing   Problem: Fluid Volume: Goal: Ability to maintain a balanced intake and output will improve Outcome: Progressing   Problem: Metabolic: Goal: Ability to maintain appropriate glucose levels will improve Outcome: Progressing   Problem: Skin Integrity: Goal: Risk for impaired skin integrity will decrease Outcome: Progressing   Problem: Clinical Measurements: Goal: Ability to maintain clinical measurements within normal limits will improve Outcome: Progressing Goal: Will remain free from infection Outcome: Progressing Goal: Diagnostic test results will improve Outcome: Progressing Goal: Respiratory complications will improve Outcome: Progressing Goal: Cardiovascular complication will be avoided Outcome: Progressing   Problem: Coping: Goal: Level of anxiety will decrease Outcome: Progressing   Problem: Elimination: Goal: Will not experience complications related to bowel motility Outcome: Progressing Goal: Will not experience complications related to urinary retention Outcome: Progressing   Problem: Pain Managment: Goal: General experience of comfort will improve and/or be controlled Outcome: Progressing   Problem: Safety: Goal: Ability to remain free from injury will improve Outcome: Progressing   Problem: Skin Integrity: Goal: Risk for impaired skin integrity will decrease Outcome: Progressing

## 2024-02-04 NOTE — TOC Progression Note (Signed)
 Transition of Care [facility]) - Progression Note    Patient Details  Name: Steven Myers MRN: [id number] Date of Birth: 01-20-1935  Transition of Care [facility]) CM/SW Contact  Michaela Corner, [place] Phone Number: 02/04/2024, 10:42 AM  Clinical Narrative:   Per Marcell Anger and MD, pt will dc home w/ hospice.   TOC will continue to follow.   Social Determinants of Health (SDOH) Interventions SDOH Screenings   Food Insecurity: No Food Insecurity (02/01/2024)  Housing: Low Risk  (02/01/2024)  Transportation Needs: No Transportation Needs (02/01/2024)  Utilities: Not At Risk (02/01/2024)  Social Connections: Socially Integrated (02/01/2024)  Tobacco Use: Medium Risk (11/21/2023)    Readmission Risk Interventions     No data to display

## 2024-02-04 NOTE — Progress Notes (Signed)
 [facility] Liaison Note  Received follow up call from patient's family. Discussed care in further detail with hospice resources. They are in agreement with proceeding with plan to discharge home soon with hospice care.   DME needs discussed. Patient has the no DME in the home. Patient/family requests the following equipment for delivery: Oxygen, hospital bed, overbed table, wheelchair, rollator walker, and bedside commode. The address has been verified and is correct in the chart. Lupita Leash or Earna Coder are the family contact to arrange time of equipment delivery.  Please provide prescriptions at discharge as needed to ensure ongoing symptom management.   [facility] information and contact numbers given to family. Above information shared with Transitions of Care Manager. Please call with any questions or concerns.   Thank you for the opportunity to participate in this patient's care.   Glenna Fellows BSN, Charity fundraiser, OCN [employer] [phone]

## 2024-02-04 NOTE — Progress Notes (Signed)
 PT Cancellation Note and Discharge  Patient Details Name: Steven Myers MRN: [id number] DOB: 1934/11/25   Cancelled Treatment:    Reason Eval/Treat Not Completed:  Per chart review, plan now for return home with hospice care. Discussed with MD who reports PT to sign off at this time. If needs change, please reconsult.   Marylynn Pearson 02/04/2024, 1:20 PM  Conni Slipper, PT, DPT Acute Rehabilitation Services Secure Chat Preferred Office: [phone]

## 2024-02-04 NOTE — Progress Notes (Addendum)
 Steven Myers  AST:[id number] DOB: 1935/07/10 DOA: 01/30/2024 PCP: Renford Dills, MD    Brief Narrative:  89 year old SNF resident with a history of cardiac amyloidosis, HTN, DM2, and CKD stage IV who was found unresponsive at his facility.  EMS found the patient to be bradycardic and hypotensive.  An epinephrine drip was initiated.  He reportedly declined into asystole and received approximately 10 chest compressions without meds with ROSC.  Of note patient had been previously DNR but family reversed to full code acutely.  In the ER the patient was intubated and Levophed was initiated.  CT of the head was without acute finding.  Significant Events: 3/9 OHSCA / brief PEA - intubated in ED - levophed - admit to ICU 3/9 EEG w/o seizure activity  3/9 reconfirmed DNR status 3/10 extubated 3/12 [facility] assumed care   Goals of Care:   Code Status: Do not attempt resuscitation (DNR) PRE-ARREST INTERVENTIONS DESIRED   DVT prophylaxis: heparin injection 5,000 Units Start: 01/30/24 1645   Interim Hx: Afebrile.  Vital signs stable.  No acute events recorded overnight.  Resting comfortably in bed with no evidence of distress or shortness of breath.  Family has been working with hospice with plans to return home tomorrow after equipment is delivered today and the patient's home environment is prepared.  Assessment & Plan:  Out-of-hospital cardiac arrest - brief PEA Etiology unclear - likely related to cardiac amyloidosis  Goals of care One-way extubation completed 3/10 by PCCM - Palliative Care and hospice have met with the family -plan is to discharge home with hospice care and hope to avoid any further hospitalizations  Postarrest encephalopathy - baseline dementia with behavioral disturbance Continue nightly Zyprexa  Postarrest ventilator dependence One-way extubation completed 3/10  Staph hemolyticus bacteremia 2/2 bottles  No evidence of infective endocarditis on TTE - repeat cultures 3/10  remain NGTD - narrowed abx coverage based on sensitivities -given clinical stability and transition to home hospice care will discontinue antibiotics after final dose today  AKI on CKD stage IIIb Creatinine has been steadily improving during this hospital stay -no indication for further monitoring at this time  Recent Labs  Lab 01/30/24 1419 01/30/24 1429 01/31/24 0703 02/01/24 0257 02/03/24 0340  CREATININE 2.12* 2.50* 2.23* 2.13* 1.79*    Incidentally noted lung nodule No indication for follow-up in this clinical situation  Persistent hypoglycemia Now improved -cortisol equivocal -has stabilized at this time  Chronic systolic CHF Well compensated at this time   Family Communication: No family present at the time of my visit today Disposition: For discharge home tomorrow with hospice care   Objective: Blood pressure 127/75, pulse (!) 112, temperature 98.5 F (36.9 C), resp. rate 18, height 6' (1.829 m), weight 81.1 kg, SpO2 99%.  Intake/Output Summary (Last 24 hours) at 02/04/2024 0909 Last data filed at 02/04/2024 0828 Gross per 24 hour  Intake 320 ml  Output 600 ml  Net -280 ml   Filed Weights   01/30/24 1816 01/31/24 0402 02/01/24 0500  Weight: 85.1 kg 84.7 kg 81.1 kg    Examination: General: No acute respiratory distress Lungs: Clear to auscultation B - no wheeze Cardiovascular: RRR Abdomen: NT/ND, soft, BS+ Extremities: No significant cyanosis, clubbing, or edema bilateral lower extremities  CBC: Recent Labs  Lab 01/30/24 1419 01/30/24 1429 01/31/24 0703 01/31/24 1711 02/03/24 0340  WBC 3.7*  --  7.0  --  6.2  NEUTROABS 2.7  --   --   --   --   HGB  17.0   < > 17.8* 19.0* 17.9*  HCT 54.1*   < > 52.4* 56.0* 54.6*  MCV 92.6  --  86.5  --  88.9  PLT 140*  --  142*  --  136*   < > = values in this interval not displayed.   Basic Metabolic Panel: Recent Labs  Lab 01/30/24 1419 01/30/24 1429 01/31/24 0703 01/31/24 1711 02/01/24 0257  02/03/24 0340  NA 133*   < > 141 143 147* 142  K 3.5   < > 4.8 4.8 5.2* 4.4  CL 102   < > 108  --  108 106  CO2 22  --  23  --  24 23  GLUCOSE 464*   < > 162*  --  84 127*  BUN 20   < > 24*  --  23 24*  CREATININE 2.12*   < > 2.23*  --  2.13* 1.79*  CALCIUM 6.8*  --  8.3*  --  8.7* 8.7*  MG 1.9  --  2.1  --   --  2.4  PHOS  --   --   --   --  4.9*  --    < > = values in this interval not displayed.   GFR: Estimated Creatinine Clearance: 32.5 mL/min (A) (by C-G formula based on SCr of 1.79 mg/dL (H)).   Scheduled Meds:  brimonidine  1 drop Both Eyes BID   Chlorhexidine Gluconate Cloth  6 each Topical Daily   feeding supplement  237 mL Oral BID BM   heparin  5,000 Units Subcutaneous Q8H   metoprolol succinate  25 mg Oral Daily   OLANZapine  5 mg Oral QHS   Tafamidis Meglumine (Cardiac)  80 mg Oral Daily   timolol  1 drop Both Eyes BID   Continuous Infusions:   ceFAZolin (ANCEF) IV 2 g (02/04/24 0842)     LOS: 5 days   Lonia Blood, MD [facility] Office  [phone] Pager - Text Page per Loretha Stapler  If 7PM-7AM, please contact night-coverage per Amion 02/04/2024, 9:09 AM

## 2024-02-04 NOTE — TOC Progression Note (Signed)
 Transition of Care [facility]) - Progression Note    Patient Details  Name: Steven Myers MRN: [id number] Date of Birth: [birth year]  Transition of Care [facility]) CM/SW Contact  Tom-Johnson, Hershal Coria, RN Phone Number: 02/04/2024, 3:44 PM  Clinical Narrative:     CM spoke with patient's wife [name] about discharge home readiness. [name] states she is waiting for DME's to be delivered which will be after 3PM today. States patent's room will be ready for patient to be discharged home tomorrow. MD notified.   CM will continue to follow.      Expected Discharge Plan and Services                                               Social Determinants of Health (SDOH) Interventions SDOH Screenings   Food Insecurity: No Food Insecurity (02/01/2024)  Housing: Low Risk  (02/01/2024)  Transportation Needs: No Transportation Needs (02/01/2024)  Utilities: Not At Risk (02/01/2024)  Social Connections: Socially Integrated (02/01/2024)  Tobacco Use: Medium Risk (11/21/2023)    Readmission Risk Interventions     No data to display

## 2024-02-05 ENCOUNTER — Other Ambulatory Visit (HOSPITAL_COMMUNITY): Payer: Self-pay

## 2024-02-05 DIAGNOSIS — I469 Cardiac arrest, cause unspecified: Secondary | ICD-10-CM | POA: Diagnosis not present

## 2024-02-05 LAB — CULTURE, BLOOD (ROUTINE X 2)
Culture: NO GROWTH
Culture: NO GROWTH

## 2024-02-05 MED ORDER — DOCUSATE SODIUM 100 MG PO CAPS: 100.0000 mg | ORAL_CAPSULE | Freq: Two times a day (BID) | ORAL | Status: DC | PRN

## 2024-02-05 MED ORDER — ACETAMINOPHEN 325 MG PO TABS: 650.0000 mg | ORAL_TABLET | Freq: Four times a day (QID) | ORAL | Status: DC | PRN

## 2024-02-05 MED ORDER — OLANZAPINE 5 MG PO TABS
5.0000 mg | ORAL_TABLET | Freq: Every day | ORAL | 0 refills | Status: DC
  Filled 2024-02-05: qty 28, 28d supply, fill #0

## 2024-02-05 MED ORDER — TORSEMIDE 20 MG PO TABS
20.0000 mg | ORAL_TABLET | Freq: Every day | ORAL | 0 refills | Status: DC | PRN
Start: 2024-02-05 — End: 2024-02-22
  Filled 2024-02-05: qty 60, 30d supply, fill #0

## 2024-02-05 MED ORDER — METOPROLOL SUCCINATE ER 25 MG PO TB24
25.0000 mg | ORAL_TABLET | Freq: Every day | ORAL | 0 refills | Status: DC
  Filled 2024-02-05: qty 90, 90d supply, fill #0

## 2024-02-05 NOTE — TOC Transition Note (Signed)
 Transition of Care [facility]) - Discharge Note   Patient Details  Name: Steven Myers MRN: [id number] Date of Birth: [birth year]  Transition [facility] Care [facility]) CM/SW Contact:  Ronny Bacon, RN Phone Number: 02/05/2024, 10:18 AM   Clinical Narrative:   Patient is being discharged home with hosice. Phone call to wife, requested that ambulance be scheduled for 2 pm pick up. Call to [facility] to schedule transportation home, pick up time 2 pm. [facility] forms completed and sent to unit to place on patient chart. Floor nurse made aware.    Final next level of care: Home w Hospice Care Barriers to Discharge: No Barriers Identified   Patient Goals and CMS Choice            Discharge Placement                       Discharge Plan and Services Additional resources added to the After Visit Summary for                                       Social Drivers of Health (SDOH) Interventions SDOH Screenings   Food Insecurity: No Food Insecurity (02/01/2024)  Housing: Low Risk  (02/01/2024)  Transportation Needs: No Transportation Needs (02/01/2024)  Utilities: Not At Risk (02/01/2024)  Social Connections: Socially Integrated (02/01/2024)  Tobacco Use: Medium Risk (02/04/2024)     Readmission Risk Interventions     No data to display

## 2024-02-05 NOTE — Discharge Summary (Addendum)
 DISCHARGE SUMMARY  Steven Myers  MR#: [id number]  DOB:[birth year]  Date of Admission: 01/30/2024 Date of Discharge: 02/05/2024  Attending Physician:Starlin Steib Silvestre Gunner, MD  Patient's GEX:BMWUXL, Windy Fast, MD  Disposition: D/C home   Follow-up Appts:  Follow-up Information     Renford Dills, MD Follow up.   Specialty: Internal Medicine Why: As needed Contact information: [address] [place] [address] [phone]                  Discharge Diagnoses: Out-of-hospital cardiac arrest - brief PEA Post arrest encephalopathy - baseline dementia with behavioral disturbance Post arrest ventilator dependence Staph hemolyticus bacteremia 2/2 bottles  AKI on CKD stage IIIb Incidentally noted lung nodule Persistent hypoglycemia Chronic systolic CHF   Initial presentation: 89 year old SNF resident with a history of cardiac amyloidosis, HTN, DM2, and CKD stage IV who was found unresponsive at his facility. EMS found the patient to be bradycardic and hypotensive. An epinephrine drip was initiated. He reportedly declined into asystole and received approximately 10 chest compressions without meds with ROSC. Of note patient had been previously DNR but family reversed to full code acutely. In the ER the patient was intubated and Levophed was initiated. CT of the head was without acute finding.   Hospital Course: 3/9 OHSCA / brief PEA - intubated in ED - levophed - admit to ICU 3/9 EEG w/o seizure activity  3/9 reconfirmed DNR status 3/10 extubated 3/12 [facility] assumed care  3/15 d/c home to family with hospice care   Out-of-hospital cardiac arrest - brief PEA Etiology unclear - likely related to cardiac amyloidosis - no further events during later portion of hospital stay    Goals of care One-way extubation completed 3/10 by PCCM - Palliative Care and Hospice have met with the family - plan is to discharge home with hospice care and hope to avoid any further  hospitalizations   Postarrest encephalopathy - baseline dementia with behavioral disturbance Continue nightly Zyprexa - calm and pleasant during this hospital stay without agitation    Postarrest ventilator dependence One-way extubation completed 3/10   Staph hemolyticus bacteremia 2/2 bottles  No evidence of infective endocarditis on TTE - repeat cultures 3/10 remain NGTD - narrowed abx coverage based on sensitivities -given clinical stability and transition to home hospice care discontinued antibiotics after final dose 3/14 - no clinical signs of persisting infection    AKI on CKD stage IIIb Creatinine has been steadily improving during this hospital stay -no indication for further monitoring at this time  Incidentally noted lung nodule No indication for follow-up in this clinical situation   Persistent hypoglycemia Now improved -cortisol equivocal -has stabilized at this time   Chronic systolic CHF Well compensated at this time   Allergies as of 02/05/2024   No Known Allergies      Medication List     STOP taking these medications    Artificial Tears ophthalmic solution   aspirin 81 MG tablet   cyanocobalamin 1000 MCG tablet   empagliflozin 10 MG Tabs tablet Commonly known as: Jardiance   ezetimibe 10 MG tablet Commonly known as: ZETIA   potassium chloride SA 20 MEQ tablet Commonly known as: KLOR-CON M   Rhopressa 0.02 % Soln Generic drug: Netarsudil Dimesylate       TAKE these medications    acetaminophen 325 MG tablet Commonly known as: TYLENOL Take 2 tablets (650 mg total) by mouth every 6 (six) hours as needed for fever.   brimonidine-timolol 0.2-0.5 %  ophthalmic solution Commonly known as: COMBIGAN Place 1 drop into both eyes in the morning and at bedtime.   docusate sodium 100 MG capsule Commonly known as: COLACE Take 1 capsule (100 mg total) by mouth 2 (two) times daily as needed for mild constipation.   latanoprost 0.005 % ophthalmic  solution Commonly known as: XALATAN Place 1 drop into both eyes at bedtime.   metoprolol succinate 25 MG 24 hr tablet Commonly known as: TOPROL-XL Take 1 tablet (25 mg total) by mouth daily. What changed: additional instructions   OLANZapine 5 MG tablet Commonly known as: ZYPREXA Take 1 tablet (5 mg total) by mouth at bedtime. What changed:  medication strength how much to take   timolol 0.5 % ophthalmic solution Commonly known as: TIMOPTIC Place 1 drop into both eyes 2 (two) times daily.   torsemide 20 MG tablet Commonly known as: DEMADEX Take 1 to 2 tablets a day as needed if you develop swelling of the legs or feet that is uncomfortable. What changed:  how much to take how to take this when to take this additional instructions   Travatan Z 0.004 % Soln ophthalmic solution Generic drug: Travoprost (BAK Free) Place 1 drop into both eyes at bedtime.   Vyndaqel 20 MG Caps Generic drug: Tafamidis Meglumine (Cardiac) Take 4 capsules (80 mg total) by mouth daily.        Day of Discharge BP 106/81 (BP Location: Right Arm)   Pulse (!) 110   Temp 97.9 F (36.6 C) (Oral)   Resp 18   Ht 6' (1.829 m)   Wt 81.1 kg   SpO2 99%   BMI 24.25 kg/m   Physical Exam: General: No acute respiratory distress Lungs: Clear to auscultation bilaterally without wheezes or crackles Cardiovascular: Regular rate and rhythm without murmur gallop or rub normal S1 and S2 Abdomen: Nontender, nondistended, soft, bowel sounds positive, no rebound, no ascites, no appreciable mass Extremities: No significant cyanosis, clubbing, or edema bilateral lower extremities  Basic Metabolic Panel: Recent Labs  Lab 01/30/24 1419 01/30/24 1429 01/30/24 1453 01/31/24 0703 01/31/24 1711 02/01/24 0257 02/03/24 0340  NA 133* 139 144 141 143 147* 142  K 3.5 4.3 3.9 4.8 4.8 5.2* 4.4  CL 102 102  --  108  --  108 106  CO2 22  --   --  23  --  24 23  GLUCOSE 464* 270*  --  162*  --  84 127*  BUN 20  32*  --  24*  --  23 24*  CREATININE 2.12* 2.50*  --  2.23*  --  2.13* 1.79*  CALCIUM 6.8*  --   --  8.3*  --  8.7* 8.7*  MG 1.9  --   --  2.1  --   --  2.4  PHOS  --   --   --   --   --  4.9*  --     CBC: Recent Labs  Lab 01/30/24 1419 01/30/24 1429 01/30/24 1453 01/31/24 0703 01/31/24 1711 02/03/24 0340  WBC 3.7*  --   --  7.0  --  6.2  NEUTROABS 2.7  --   --   --   --   --   HGB 17.0 18.0* 19.0* 17.8* 19.0* 17.9*  HCT 54.1* 53.0* 56.0* 52.4* 56.0* 54.6*  MCV 92.6  --   --  86.5  --  88.9  PLT 140*  --   --  142*  --  136*  Time spent in discharge (includes decision making & examination of pt): 35 minutes  02/05/2024, 8:54 AM   Lonia Blood, MD [facility] Office  [phone]

## 2024-02-07 ENCOUNTER — Other Ambulatory Visit: Payer: Self-pay

## 2024-02-07 DIAGNOSIS — I5022 Chronic systolic (congestive) heart failure: Secondary | ICD-10-CM | POA: Diagnosis not present

## 2024-02-07 DIAGNOSIS — I10 Essential (primary) hypertension: Secondary | ICD-10-CM | POA: Diagnosis not present

## 2024-02-07 DIAGNOSIS — N184 Chronic kidney disease, stage 4 (severe): Secondary | ICD-10-CM | POA: Diagnosis not present

## 2024-02-07 DIAGNOSIS — J9601 Acute respiratory failure with hypoxia: Secondary | ICD-10-CM | POA: Diagnosis not present

## 2024-02-07 DIAGNOSIS — E8589 Other amyloidosis: Secondary | ICD-10-CM | POA: Diagnosis not present

## 2024-02-07 DIAGNOSIS — E119 Type 2 diabetes mellitus without complications: Secondary | ICD-10-CM | POA: Diagnosis not present

## 2024-02-07 DIAGNOSIS — R0601 Orthopnea: Secondary | ICD-10-CM | POA: Diagnosis not present

## 2024-02-07 LAB — GLUCOSE, CAPILLARY: Glucose-Capillary: 139 mg/dL — ABNORMAL HIGH (ref 70–99)

## 2024-02-07 NOTE — Progress Notes (Signed)
 Disenrolling - spoke with patient's daughter and patient was discharged from hospital and is now on hospice care.

## 2024-02-08 ENCOUNTER — Telehealth: Payer: Self-pay | Admitting: Genetic Counselor

## 2024-02-08 DIAGNOSIS — R0601 Orthopnea: Secondary | ICD-10-CM | POA: Diagnosis not present

## 2024-02-08 DIAGNOSIS — I5022 Chronic systolic (congestive) heart failure: Secondary | ICD-10-CM | POA: Diagnosis not present

## 2024-02-08 DIAGNOSIS — N184 Chronic kidney disease, stage 4 (severe): Secondary | ICD-10-CM | POA: Diagnosis not present

## 2024-02-08 DIAGNOSIS — E119 Type 2 diabetes mellitus without complications: Secondary | ICD-10-CM | POA: Diagnosis not present

## 2024-02-08 DIAGNOSIS — E8589 Other amyloidosis: Secondary | ICD-10-CM | POA: Diagnosis not present

## 2024-02-08 DIAGNOSIS — I10 Essential (primary) hypertension: Secondary | ICD-10-CM | POA: Diagnosis not present

## 2024-02-08 NOTE — Telephone Encounter (Signed)
 Returned a call back to the pts daughter Lupita Leash (on [place]).  Per Lupita Leash, our Geneticist Dr. Jomarie Longs ordered genetic testing on the pt back on 2/11, and they have not heard from the results of that test yet.   Lupita Leash is calling to inquire about these results.   Informed Lupita Leash that I will route this message to Dr. Jomarie Longs to further review and follow-up with them on genetic testing results.   Lupita Leash verbalized understanding and agrees with this plan.

## 2024-02-08 NOTE — Telephone Encounter (Signed)
 Pt daughter called in asking for update on pt's genetic testing results.

## 2024-02-08 NOTE — Telephone Encounter (Signed)
 Just spoke with Dr. Nechama Guard scheduler with our practice about how best to get in contact with her to get the pts results to both him and his daughter Lupita Leash.   Per Tacey Ruiz with Scheduling, she will reach out to Dr. Jomarie Longs via staff message to inquire about this message.   Tacey Ruiz did mention that sometimes genetic testing results can take several months to come back.   Tacey Ruiz also provided me with the number to Dr. Edison Simon lab, incase she is unable to make contact with her via staff message about this pts results.    Number to [facility] copied below:  Lab [facility], [phone]     Will update the daughter Lupita Leash that this indeed could be the reason they haven't heard from the results yet, for some genetic testing can take several months to come back.   Updated the pts daughter Lupita Leash about this and she verbalized understanding and agrees with this plan.  She will await to hear back from Dr. Jomarie Longs.   Will route this call to Dr. Jomarie Longs as well.

## 2024-02-09 DIAGNOSIS — I5022 Chronic systolic (congestive) heart failure: Secondary | ICD-10-CM | POA: Diagnosis not present

## 2024-02-09 DIAGNOSIS — R0601 Orthopnea: Secondary | ICD-10-CM | POA: Diagnosis not present

## 2024-02-09 DIAGNOSIS — E8589 Other amyloidosis: Secondary | ICD-10-CM | POA: Diagnosis not present

## 2024-02-09 DIAGNOSIS — I10 Essential (primary) hypertension: Secondary | ICD-10-CM | POA: Diagnosis not present

## 2024-02-09 DIAGNOSIS — E119 Type 2 diabetes mellitus without complications: Secondary | ICD-10-CM | POA: Diagnosis not present

## 2024-02-09 DIAGNOSIS — N184 Chronic kidney disease, stage 4 (severe): Secondary | ICD-10-CM | POA: Diagnosis not present

## 2024-02-11 ENCOUNTER — Encounter (HOSPITAL_COMMUNITY): Payer: Medicare Other | Admitting: Cardiology

## 2024-02-21 NOTE — Telephone Encounter (Signed)
 Spoke with pt's daughter Lupita Leash, [place] and advised per Dr Jomarie Longs report is ready and will be sent to [facility] this week.  Pt's daughter states her father passed away on 03-06-24 but pt's family would still like to know the results of testing.  Pt's daughter advised will forward to Dr Gasper Lloyd to make him aware.  RN offered condolences to the family.  Daughter thanked Charity fundraiser for the callback.

## 2024-02-22 DEATH — deceased
